# Patient Record
Sex: Male | Born: 1935
Health system: Southern US, Community
[De-identification: ages and names within clinical notes are randomized; demographics above are authoritative.]

## PROBLEM LIST (undated history)

## (undated) DIAGNOSIS — I6502 Occlusion and stenosis of left vertebral artery: Secondary | ICD-10-CM

## (undated) DIAGNOSIS — J45909 Unspecified asthma, uncomplicated: Secondary | ICD-10-CM

## (undated) DIAGNOSIS — K219 Gastro-esophageal reflux disease without esophagitis: Secondary | ICD-10-CM

## (undated) DIAGNOSIS — M1990 Unspecified osteoarthritis, unspecified site: Secondary | ICD-10-CM

## (undated) DIAGNOSIS — I10 Essential (primary) hypertension: Secondary | ICD-10-CM

## (undated) DIAGNOSIS — E1151 Type 2 diabetes mellitus with diabetic peripheral angiopathy without gangrene: Secondary | ICD-10-CM

## (undated) DIAGNOSIS — I25810 Atherosclerosis of coronary artery bypass graft(s) without angina pectoris: Secondary | ICD-10-CM

## (undated) DIAGNOSIS — H409 Unspecified glaucoma: Secondary | ICD-10-CM

## (undated) DIAGNOSIS — E785 Hyperlipidemia, unspecified: Secondary | ICD-10-CM

## (undated) DIAGNOSIS — Z951 Presence of aortocoronary bypass graft: Secondary | ICD-10-CM

## (undated) DIAGNOSIS — G629 Polyneuropathy, unspecified: Secondary | ICD-10-CM

## (undated) DIAGNOSIS — I2510 Atherosclerotic heart disease of native coronary artery without angina pectoris: Secondary | ICD-10-CM

## (undated) DIAGNOSIS — I771 Stricture of artery: Secondary | ICD-10-CM

## (undated) DIAGNOSIS — R079 Chest pain, unspecified: Secondary | ICD-10-CM

## (undated) HISTORY — DX: Atherosclerotic heart disease of native coronary artery without angina pectoris: I25.10

## (undated) HISTORY — DX: Hyperlipidemia, unspecified: E78.5

## (undated) HISTORY — DX: Occlusion and stenosis of left vertebral artery: I65.02

## (undated) HISTORY — DX: Stricture of artery: I77.1

## (undated) HISTORY — DX: Type 2 diabetes mellitus with diabetic peripheral angiopathy without gangrene: E11.51

## (undated) HISTORY — PX: EYE SURGERY: SHX253

## (undated) HISTORY — DX: Unspecified glaucoma: H40.9

## (undated) HISTORY — PX: CORONARY ANGIOPLASTY: SHX604

## (undated) HISTORY — PX: CORONARY STENT PLACEMENT: SHX1402

## (undated) HISTORY — DX: Essential (primary) hypertension: I10

## (undated) HISTORY — DX: Presence of aortocoronary bypass graft: Z95.1

## (undated) HISTORY — PX: SP EXTRACRAN VERT OR THOR CAROTID STENT: HXRAD15

---

## 1993-01-03 DIAGNOSIS — I2510 Atherosclerotic heart disease of native coronary artery without angina pectoris: Secondary | ICD-10-CM

## 1993-01-03 DIAGNOSIS — Z951 Presence of aortocoronary bypass graft: Secondary | ICD-10-CM

## 1993-01-03 HISTORY — PX: CORONARY ARTERY BYPASS GRAFT: SHX141

## 1993-01-03 HISTORY — DX: Atherosclerotic heart disease of native coronary artery without angina pectoris: I25.10

## 1993-01-03 HISTORY — DX: Presence of aortocoronary bypass graft: Z95.1

## 1993-08-11 DIAGNOSIS — Z951 Presence of aortocoronary bypass graft: Secondary | ICD-10-CM | POA: Insufficient documentation

## 1993-11-26 DIAGNOSIS — I25118 Atherosclerotic heart disease of native coronary artery with other forms of angina pectoris: Secondary | ICD-10-CM | POA: Insufficient documentation

## 1998-01-03 DIAGNOSIS — I25810 Atherosclerosis of coronary artery bypass graft(s) without angina pectoris: Secondary | ICD-10-CM

## 1998-01-03 HISTORY — DX: Atherosclerosis of coronary artery bypass graft(s) without angina pectoris: I25.810

## 1998-08-03 ENCOUNTER — Inpatient Hospital Stay (HOSPITAL_COMMUNITY): Admission: AD | Admit: 1998-08-03 | Discharge: 1998-08-05 | Payer: Self-pay | Admitting: Cardiology

## 1998-08-10 ENCOUNTER — Inpatient Hospital Stay (HOSPITAL_COMMUNITY): Admission: EM | Admit: 1998-08-10 | Discharge: 1998-08-11 | Payer: Self-pay | Admitting: Emergency Medicine

## 1998-08-10 ENCOUNTER — Encounter: Payer: Self-pay | Admitting: Cardiology

## 1998-08-11 ENCOUNTER — Encounter: Payer: Self-pay | Admitting: Cardiology

## 1998-11-02 ENCOUNTER — Encounter: Admission: RE | Admit: 1998-11-02 | Discharge: 1998-11-02 | Payer: Self-pay | Admitting: *Deleted

## 1998-11-02 ENCOUNTER — Encounter: Payer: Self-pay | Admitting: *Deleted

## 1999-01-04 DIAGNOSIS — Z951 Presence of aortocoronary bypass graft: Secondary | ICD-10-CM

## 1999-01-04 HISTORY — DX: Presence of aortocoronary bypass graft: Z95.1

## 1999-04-14 ENCOUNTER — Inpatient Hospital Stay (HOSPITAL_COMMUNITY): Admission: AD | Admit: 1999-04-14 | Discharge: 1999-04-15 | Payer: Self-pay | Admitting: Cardiology

## 1999-05-10 ENCOUNTER — Ambulatory Visit (HOSPITAL_COMMUNITY): Admission: RE | Admit: 1999-05-10 | Discharge: 1999-05-10 | Payer: Self-pay | Admitting: Cardiology

## 1999-05-10 ENCOUNTER — Encounter: Payer: Self-pay | Admitting: Cardiology

## 1999-05-24 ENCOUNTER — Inpatient Hospital Stay (HOSPITAL_COMMUNITY): Admission: EM | Admit: 1999-05-24 | Discharge: 1999-05-27 | Payer: Self-pay | Admitting: Emergency Medicine

## 1999-06-04 HISTORY — PX: CORONARY ARTERY BYPASS GRAFT: SHX141

## 1999-06-15 ENCOUNTER — Encounter: Payer: Self-pay | Admitting: Cardiology

## 1999-06-15 ENCOUNTER — Ambulatory Visit (HOSPITAL_COMMUNITY): Admission: RE | Admit: 1999-06-15 | Discharge: 1999-06-15 | Payer: Self-pay | Admitting: Cardiology

## 1999-06-25 ENCOUNTER — Encounter: Payer: Self-pay | Admitting: Cardiothoracic Surgery

## 1999-06-30 ENCOUNTER — Inpatient Hospital Stay (HOSPITAL_COMMUNITY): Admission: RE | Admit: 1999-06-30 | Discharge: 1999-07-05 | Payer: Self-pay | Admitting: Cardiothoracic Surgery

## 1999-06-30 ENCOUNTER — Encounter: Payer: Self-pay | Admitting: Cardiothoracic Surgery

## 1999-07-01 ENCOUNTER — Encounter: Payer: Self-pay | Admitting: Cardiothoracic Surgery

## 1999-07-02 ENCOUNTER — Encounter: Payer: Self-pay | Admitting: Cardiothoracic Surgery

## 1999-07-03 ENCOUNTER — Encounter: Payer: Self-pay | Admitting: Cardiothoracic Surgery

## 1999-07-21 ENCOUNTER — Encounter: Admission: RE | Admit: 1999-07-21 | Discharge: 1999-07-21 | Payer: Self-pay | Admitting: Cardiology

## 1999-07-21 ENCOUNTER — Encounter: Payer: Self-pay | Admitting: Cardiology

## 2000-01-04 DIAGNOSIS — E119 Type 2 diabetes mellitus without complications: Secondary | ICD-10-CM

## 2000-01-04 DIAGNOSIS — E1151 Type 2 diabetes mellitus with diabetic peripheral angiopathy without gangrene: Secondary | ICD-10-CM

## 2000-01-04 HISTORY — DX: Type 2 diabetes mellitus with diabetic peripheral angiopathy without gangrene: E11.51

## 2000-01-04 HISTORY — DX: Type 2 diabetes mellitus without complications: E11.9

## 2000-08-27 ENCOUNTER — Inpatient Hospital Stay (HOSPITAL_COMMUNITY): Admission: EM | Admit: 2000-08-27 | Discharge: 2000-09-01 | Payer: Self-pay | Admitting: Cardiovascular Disease

## 2000-08-29 ENCOUNTER — Encounter: Payer: Self-pay | Admitting: Cardiology

## 2000-08-31 ENCOUNTER — Encounter: Payer: Self-pay | Admitting: Cardiology

## 2000-09-07 ENCOUNTER — Encounter: Admission: RE | Admit: 2000-09-07 | Discharge: 2000-09-07 | Payer: Self-pay | Admitting: Interventional Radiology

## 2000-09-12 ENCOUNTER — Encounter: Admission: RE | Admit: 2000-09-12 | Discharge: 2000-09-12 | Payer: Self-pay | Admitting: Interventional Radiology

## 2001-01-22 ENCOUNTER — Encounter: Admission: RE | Admit: 2001-01-22 | Discharge: 2001-04-22 | Payer: Self-pay

## 2001-02-06 ENCOUNTER — Encounter: Payer: Self-pay | Admitting: Cardiology

## 2001-02-06 ENCOUNTER — Inpatient Hospital Stay (HOSPITAL_COMMUNITY): Admission: EM | Admit: 2001-02-06 | Discharge: 2001-02-07 | Payer: Self-pay

## 2001-02-07 ENCOUNTER — Encounter: Payer: Self-pay | Admitting: Cardiology

## 2001-02-15 ENCOUNTER — Encounter: Payer: Self-pay | Admitting: Cardiology

## 2001-02-15 ENCOUNTER — Encounter: Admission: RE | Admit: 2001-02-15 | Discharge: 2001-02-15 | Payer: Self-pay | Admitting: Cardiology

## 2001-03-05 ENCOUNTER — Ambulatory Visit (HOSPITAL_COMMUNITY): Admission: RE | Admit: 2001-03-05 | Discharge: 2001-03-05 | Payer: Self-pay

## 2001-03-06 ENCOUNTER — Encounter: Admission: RE | Admit: 2001-03-06 | Discharge: 2001-03-06 | Payer: Self-pay | Admitting: Cardiology

## 2001-03-06 ENCOUNTER — Ambulatory Visit (HOSPITAL_COMMUNITY): Admission: RE | Admit: 2001-03-06 | Discharge: 2001-03-06 | Payer: Self-pay

## 2001-03-06 ENCOUNTER — Encounter: Payer: Self-pay | Admitting: Cardiology

## 2001-04-04 ENCOUNTER — Ambulatory Visit (HOSPITAL_COMMUNITY): Admission: RE | Admit: 2001-04-04 | Discharge: 2001-04-04 | Payer: Self-pay | Admitting: Anesthesiology

## 2001-04-04 ENCOUNTER — Encounter: Payer: Self-pay | Admitting: Anesthesiology

## 2001-04-18 ENCOUNTER — Encounter: Admission: RE | Admit: 2001-04-18 | Discharge: 2001-07-17 | Payer: Self-pay

## 2001-05-01 ENCOUNTER — Encounter: Admission: RE | Admit: 2001-05-01 | Discharge: 2001-05-15 | Payer: Self-pay

## 2002-01-09 ENCOUNTER — Encounter: Admission: RE | Admit: 2002-01-09 | Discharge: 2002-01-09 | Payer: Self-pay | Admitting: Rheumatology

## 2002-08-25 ENCOUNTER — Encounter: Payer: Self-pay | Admitting: *Deleted

## 2002-08-25 ENCOUNTER — Emergency Department (HOSPITAL_COMMUNITY): Admission: AD | Admit: 2002-08-25 | Discharge: 2002-08-25 | Payer: Self-pay | Admitting: Emergency Medicine

## 2002-10-04 HISTORY — PX: SHOULDER OPEN ROTATOR CUFF REPAIR: SHX2407

## 2002-10-08 ENCOUNTER — Encounter: Payer: Self-pay | Admitting: Orthopedic Surgery

## 2002-10-15 ENCOUNTER — Ambulatory Visit (HOSPITAL_COMMUNITY): Admission: RE | Admit: 2002-10-15 | Discharge: 2002-10-16 | Payer: Self-pay | Admitting: Orthopedic Surgery

## 2002-11-02 ENCOUNTER — Inpatient Hospital Stay (HOSPITAL_COMMUNITY): Admission: EM | Admit: 2002-11-02 | Discharge: 2002-11-03 | Payer: Self-pay | Admitting: *Deleted

## 2003-11-04 DIAGNOSIS — I771 Stricture of artery: Secondary | ICD-10-CM

## 2003-11-04 HISTORY — DX: Stricture of artery: I77.1

## 2003-11-04 HISTORY — PX: SUBCLAVIAN ARTERY STENT: SHX2452

## 2003-11-05 ENCOUNTER — Encounter: Admission: RE | Admit: 2003-11-05 | Discharge: 2003-11-05 | Payer: Self-pay | Admitting: Cardiology

## 2003-11-11 ENCOUNTER — Inpatient Hospital Stay (HOSPITAL_COMMUNITY): Admission: RE | Admit: 2003-11-11 | Discharge: 2003-11-12 | Payer: Self-pay | Admitting: Cardiovascular Disease

## 2003-11-21 ENCOUNTER — Inpatient Hospital Stay (HOSPITAL_COMMUNITY): Admission: RE | Admit: 2003-11-21 | Discharge: 2003-11-22 | Payer: Self-pay | Admitting: Cardiovascular Disease

## 2003-12-22 ENCOUNTER — Encounter: Admission: RE | Admit: 2003-12-22 | Discharge: 2003-12-22 | Payer: Self-pay | Admitting: Interventional Radiology

## 2004-04-26 ENCOUNTER — Ambulatory Visit (HOSPITAL_COMMUNITY): Admission: RE | Admit: 2004-04-26 | Discharge: 2004-04-26 | Payer: Self-pay | Admitting: Cardiology

## 2004-06-15 ENCOUNTER — Ambulatory Visit (HOSPITAL_COMMUNITY): Admission: RE | Admit: 2004-06-15 | Discharge: 2004-06-15 | Payer: Self-pay | Admitting: Interventional Radiology

## 2004-11-17 ENCOUNTER — Encounter: Admission: RE | Admit: 2004-11-17 | Discharge: 2004-11-17 | Payer: Self-pay | Admitting: Cardiology

## 2004-11-23 ENCOUNTER — Ambulatory Visit (HOSPITAL_COMMUNITY): Admission: RE | Admit: 2004-11-23 | Discharge: 2004-11-23 | Payer: Self-pay | Admitting: Cardiology

## 2007-03-25 ENCOUNTER — Emergency Department (HOSPITAL_COMMUNITY): Admission: EM | Admit: 2007-03-25 | Discharge: 2007-03-25 | Payer: Self-pay | Admitting: Emergency Medicine

## 2009-05-22 ENCOUNTER — Emergency Department (HOSPITAL_COMMUNITY): Admission: EM | Admit: 2009-05-22 | Discharge: 2009-05-23 | Payer: Self-pay | Admitting: Emergency Medicine

## 2010-03-05 ENCOUNTER — Emergency Department (HOSPITAL_COMMUNITY): Payer: Medicare Other

## 2010-03-05 ENCOUNTER — Emergency Department (HOSPITAL_COMMUNITY)
Admission: EM | Admit: 2010-03-05 | Discharge: 2010-03-06 | Disposition: A | Discharge status: Home / Self Care | Payer: Medicare Other | Attending: Emergency Medicine | Admitting: Emergency Medicine

## 2010-03-05 DIAGNOSIS — Z79899 Other long term (current) drug therapy: Secondary | ICD-10-CM | POA: Insufficient documentation

## 2010-03-05 DIAGNOSIS — E7800 Pure hypercholesterolemia, unspecified: Secondary | ICD-10-CM | POA: Insufficient documentation

## 2010-03-05 DIAGNOSIS — E119 Type 2 diabetes mellitus without complications: Secondary | ICD-10-CM | POA: Insufficient documentation

## 2010-03-05 DIAGNOSIS — I10 Essential (primary) hypertension: Secondary | ICD-10-CM | POA: Insufficient documentation

## 2010-03-05 DIAGNOSIS — R079 Chest pain, unspecified: Secondary | ICD-10-CM | POA: Insufficient documentation

## 2010-03-05 LAB — BASIC METABOLIC PANEL
BUN: 12 mg/dL (ref 6–23)
Calcium: 9.6 mg/dL (ref 8.4–10.5)
GFR calc non Af Amer: >60 mL/min (ref >60)
Potassium: 3.5 mEq/L (ref 3.5–5.1)

## 2010-03-05 LAB — DIFFERENTIAL
Basophils Absolute: 0 10*3/uL (ref 0.0–0.1)
Eosinophils Absolute: 0 10*3/uL (ref 0.0–0.7)
Lymphocytes Relative: 18 % (ref 12–46)
Lymphs Abs: 1.3 10*3/uL (ref 0.7–4.0)
Neutrophils Relative %: 73 % (ref 43–77)

## 2010-03-05 LAB — CBC
HCT: 36.3 % — ABNORMAL LOW (ref 39.0–52.0)
MCV: 84 fL (ref 78.0–100.0)
Platelets: 225 10*3/uL (ref 150–400)
RBC: 4.32 MIL/uL (ref 4.22–5.81)
WBC: 7.1 10*3/uL (ref 4.0–10.5)

## 2010-03-05 LAB — POCT CARDIAC MARKERS
CKMB, poc: 1.1 ng/mL (ref 1.0–8.0)
Myoglobin, poc: 67.2 ng/mL (ref 12–200)
Troponin i, poc: <0.05 ng/mL (ref 0.00–0.09)

## 2010-03-06 LAB — POCT CARDIAC MARKERS: Troponin i, poc: <0.05 ng/mL (ref 0.00–0.09)

## 2010-03-22 LAB — POCT I-STAT, CHEM 8
Calcium, Ion: 0.99 mmol/L — ABNORMAL LOW (ref 1.12–1.32)
Chloride: 108 mEq/L (ref 96–112)
Glucose, Bld: 128 mg/dL — ABNORMAL HIGH (ref 70–99)
HCT: 30 % — ABNORMAL LOW (ref 39.0–52.0)
Hemoglobin: 10.2 g/dL — ABNORMAL LOW (ref 13.0–17.0)
Potassium: 4.1 mEq/L (ref 3.5–5.1)

## 2010-03-22 LAB — URINALYSIS, ROUTINE W REFLEX MICROSCOPIC
Glucose, UA: NEGATIVE mg/dL
Ketones, ur: NEGATIVE mg/dL
Nitrite: NEGATIVE
Protein, ur: NEGATIVE mg/dL
pH: 6.5 (ref 5.0–8.0)

## 2010-03-22 LAB — CBC
HCT: 37.5 % — ABNORMAL LOW (ref 39.0–52.0)
Hemoglobin: 13.1 g/dL (ref 13.0–17.0)
MCHC: 35.1 g/dL (ref 30.0–36.0)
MCV: 87.4 fL (ref 78.0–100.0)
Platelets: 215 10*3/uL (ref 150–400)
RDW: 13.2 % (ref 11.5–15.5)

## 2010-03-22 LAB — COMPREHENSIVE METABOLIC PANEL
Albumin: 3.9 g/dL (ref 3.5–5.2)
BUN: 11 mg/dL (ref 6–23)
Calcium: 9.2 mg/dL (ref 8.4–10.5)
Creatinine, Ser: 0.96 mg/dL (ref 0.4–1.5)
Glucose, Bld: 128 mg/dL — ABNORMAL HIGH (ref 70–99)
Potassium: 3.6 mEq/L (ref 3.5–5.1)
Total Protein: 6.5 g/dL (ref 6.0–8.3)

## 2010-03-22 LAB — HEMOCCULT GUIAC POC 1CARD (OFFICE): Fecal Occult Bld: NEGATIVE

## 2010-03-22 LAB — DIFFERENTIAL
Basophils Relative: 0 % (ref 0–1)
Lymphocytes Relative: 17 % (ref 12–46)
Monocytes Absolute: 0.5 10*3/uL (ref 0.1–1.0)
Monocytes Relative: 6 % (ref 3–12)
Neutro Abs: 7.4 10*3/uL (ref 1.7–7.7)
Neutrophils Relative %: 77 % (ref 43–77)

## 2010-03-22 LAB — TROPONIN I: Troponin I: 0.02 ng/mL (ref 0.00–0.06)

## 2010-05-21 NOTE — Procedures (Signed)
[facility]  Patient:    MICHAL, BENESH Visit Number: [id number] MRN: [id number]          Service Type: MED Location: [id number] Attending Physician:  Lawana Pai Dictated by:   Francia Greaves. Carlos Levering, M.D. Proc. Date: 03/06/01 Admit Date:  02/06/2001 Discharge Date: 02/07/2001                             Procedure Report  HISTORY:   [facility]. I evaluate a Review of Health and History form and 14 point review of systems.  PROBLEM LIST:  Our plan is second series of thoracic epidural. I reviewed the thoracic CT which revealed hemangioma at T10. This could explain some of his pain, although not complete. He also does have degenerative components.  PHYSICAL EXAMINATION:  Objectively, he has diffuse parathoracic myofascial discomfort, impaired flexion, extension, and lateral rotational pain. He seems to have less myofascial pain. He has no knew neurological features, motor, sensory, reflexive.  IMPRESSION: 1. Degenerative spinal disease, lumbar spine. 2. Degenerative spinal disease, thoracic spine. 3. Post thoracotomy syndrome.  PLAN:  Thoracic epidural. He is consented.  DESCRIPTION OF PROCEDURE:  The patient is taken to the fluoroscopy suite and was placed in the prone position. Back prepped and draped in usual fashion using a Hustead needle, I advanced to the T10-11 interspace without any evidence of CSF, heme, or paresthesia. Test block uneventfully followed by 40 mg of Aristocort, flushed needle.  She tolerates the procedure well. No complication from our procedure. Discharge instructions given. Dictated by:   Francia Greaves Carlos Levering, M.D. Attending Physician:  Lawana Pai DD:  03/06/01 TD:  03/07/01 Job: [id number]

## 2010-05-21 NOTE — Op Note (Signed)
NAME:  [name], [name]                         ACCOUNT NO.:  [id number]   MEDICAL RECORD NO.:  [id number]                   PATIENT TYPE:  OIB   LOCATION:  NA                                   FACILITY:  [facility]   PHYSICIAN:  Robert A. Noemi Chapel, M.D.              DATE OF BIRTH:  [birth year]   DATE OF PROCEDURE:  10/15/2002  DATE OF DISCHARGE:                                 OPERATIVE REPORT   PREOPERATIVE DIAGNOSIS:  Right shoulder partial rotator cuff tear with  impingement and acromioclavicular joint degenerative joint disease and  spurring.   POSTOPERATIVE DIAGNOSIS:  Right shoulder partial rotator cuff tear with  impingement and acromioclavicular joint degenerative joint disease and  spurring.   PROCEDURE:  1. Right shoulder examination under anesthesia followed by arthroscopic     partial rotator cuff tear debridement.  2. Right shoulder subacromial decompression.  3. Right shoulder distal clavicle excision.   SURGEON:  Audree Camel. Noemi Chapel, M.D.   ASSISTANT:  Matthew Saras, P.A.   ANESTHESIA:  General.   OPERATIVE TIME:  40 minutes.   COMPLICATIONS:  None.   INDICATIONS FOR PROCEDURE:  [name] is a 77 year old gentleman who has had  4-5 months of increasing right shoulder pain with exam and x-rays  documenting rotator cuff tendonitis, possible partial tear with impingement,  who has failed conservative care and is now to undergo arthroscopy.   DESCRIPTION OF PROCEDURE:  [name] was brought to the operating room on  October 15, 2002, after an interscalene block had been placed in the holding  room by anesthesia for postoperative pain control.  He was placed on the  operating table in supine position.  After being placed under general  anesthesia, his right shoulder was examined under anesthesia.  He had full  range of motion and his shoulder was stable to ligamentous exam.  He was  then placed in a beach chair position and his shoulder and arm were prepped  using  sterile DuraPrep and draped using sterile technique.  Originally,  through a posterior arthroscopic portal, the arthroscope with the pump  attached was placed through an anterior portal and an arthroscopic probe was  placed.  On initial inspection, the articular cartilage in the glenohumeral  joint showed mild grade 1-2 chondromalacia.  The anterior labrum and the  anterior inferior labrum, and the anterior inferior glenohumeral ligament  complex was intact.  The superior labrum biceps tendon anchor was intact,  the biceps tendon was intact.  The posterior labrum showed partial tearing  25% which was debrided.  The rotator cuff showed partial tearing of 40-50%  of the under surface of the supraspinatus which was debrided.  The rest of  the rotator cuff was intact.  The inferior capsular recess was free of  pathology.  The subacromial space was entered and the lateral arthroscopic  portal was made.  A large amount of bursitis was resected.  The rotator cuff  was frayed and partially torn and this was debrided, but a complete tear was  not found.  Impingement was noted and a subacromial decompression was  carried out removing 6-8 mm of the under surface of the anterior,  anterolateral, and anteromedial acromion and CA ligament release carried  out, as well.  The [facility] joint showed significant spurring and degenerative  changes, as well, and the distal 5 mm of the clavicle was resected with a 6  mm bur.  After this was done, the shoulder could be brought through a full  range of motion with no impingement on the rotator cuff.  At this point, it  was felt that all pathology had been satisfactorily addressed.  The  instruments were removed.  The portals were closed with 3-0 nylon sutures.  A sterile dressing and sling was applied.  The patient was awakened and  taken to the recovery room in stable condition.   FOLLOW UP CARE:  [name] will be followed overnight for observation.  He  will be  discharged tomorrow if stable.  See me in the office in a week for  sutures out and follow up.                                               Robert A. Noemi Chapel, M.D.    RAW/MEDQ  D:  10/15/2002  T:  10/15/2002  Job:  [id number]

## 2010-05-21 NOTE — Discharge Summary (Signed)
NAME:  [name], [name]                         ACCOUNT NO.:  [id number]   MEDICAL RECORD NO.:  [id number]                   PATIENT TYPE:  INP   LOCATION:  2015                                 FACILITY:  [facility] [facility]   PHYSICIAN:  Jeanella Craze. Little, M.D.              DATE OF BIRTH:  September 23, 1932   DATE OF ADMISSION:  11/01/2002  DATE OF DISCHARGE:  11/03/2002                                 DISCHARGE SUMMARY   DISCHARGE DIAGNOSES:  1. Chest pain, myocardial infarction ruled out.  2. Coronary disease, coronary artery bypass grafting in 1995 with redo     surgery in 2001.  3. Chronic chest pain syndrome.  4. Noninsulin-dependent diabetes.  5. Hypertension, controlled.  6. Treated hyperlipidemia.  7. History of peripheral vascular disease with vertebral artery stenting in     August of 2002.  8. Status post recent shoulder surgery.   HISTORY OF PRESENT ILLNESS:  The patient is a 77 year old male followed by  Dr. Rex Kras and Dr. Nadara Mustard with a history of chronic chest pain after bypass  surgery.  He had his original surgery in 1995 with an LIMA to LAD, SVG to  the OM, and SVG to RCA.  He had redo surgery with a left radial artery to  the LAD in 2001 by Dr. Prescott Gum.  He was catheterized in August of 2002.  He has been followed at the chest pain clinic.  He had a Cardiolite in  February of 2003 that was negative for ischemia.  Recently had had shoulder  surgery on October 15, 2002, by Dr. Noemi Chapel, a right rotator cuff repair.  He  was seen in the emergency room on November 02, 2002, with chest pain, which  was described as midsternal and sharp.  He says that nitroglycerin seemed to  help.  He was noted to be febrile in the emergency room.   HOSPITAL COURSE:  The patient was admitted to telemetry.  CK-MBs and  troponins were obtained.  He had a recent abdominal ultrasound in August  that was negative.  A carotid ultrasound was unremarkable in January of  2004.  He has had carotid stenting in  August of 2002.  CK-MBs and troponins  were subsequently negative for an MI.  He did complain of dysuria and a  urinalysis showed nitrates and white cells.  A urine culture was sent, as  well as blood cultures.  He was started on Septra for presumed UTI.  We went  ahead with a CT of his chest to rule out pulmonary embolism.  This was  negative.  We feel that he can be discharged on November 03, 2002.   DISCHARGE MEDICATIONS:  1. Glucophage 500 mg a day.  2. Lipitor 40 mg a day.  3. Altace 10 mg a day.  4. Prilosec 20 mg a day.  5. Aspirin 81 mg a day.  6. Niacin as taken at home.  7. Septra DS one p.o. b.i.d. for 10 days.   LABORATORY DATA:  Spiral CT was negative for pulmonary embolism.  Chest x-  ray showed no active disease or evidence of COPD.  The white count on  admission was 16.2.  The white count at discharge was 13.3, hemoglobin 14,  hematocrit 39.8, and platelets 196.  He did have 87 neutrophils.  Sodium  132, potassium 4.5, BUN 14, creatinine 0.9.  Liver functions were normal.  CK-MBs and troponins were negative.  The lipid profile showed a cholesterol  of 115, triglycerides 92, HDL 42, and LDL 55.  The urinalysis showed ketones  and proteinuria, positive nitrates, moderate leukocyte esterase, and many  bacteria and white cells.  Blood cultures were negative.  Urine culture  showed 9000 colony count.   DISPOSITION:  The patient was discharged in stable condition with a presumed  UTI despite his negative culture.   FOLLOWUP:  He will follow up with Dr. Rex Kras in the office in a few weeks.  He will keep his regularly scheduled appointment with Dr. Nadara Mustard.      Erlene Quan, P.A.                      Jeanella Craze. Little, M.D.    Meryl Dare  D:  11/19/2002  T:  11/19/2002  Job:  [id number]   cc:   Rory Percy  [address] [place]. [place] [address]  Fax: [phone]

## 2010-05-21 NOTE — Op Note (Signed)
[place]  Patient:    [name], [name]                        MRN: [id number] Proc. Date: 06/30/99 Attending:  Len Childs, M.D. CC:         Jeanella Craze. Little, M.D.             Cardiothoracic Surgery office                           Operative Report  OPERATION:  Redo coronary artery bypass grafting x 1 using left radial artery graft to distal left anterior descending.  PREOPERATIVE DIAGNOSIS: Class 4 progressive angina, recurrent following coronary artery bypass grafting x 4 in 1995.  POSTOPERATIVE DIAGNOSIS: Class 4 progressive angina, recurrent following coronary artery bypass grafting x 4 in 1995.  SURGEON:  Len Childs, M.D.  ASSISTANT:  Ricki Miller, P.A.  ANESTHESIA:  General, by Glynda Jaeger, M.D.  INDICATIONS:  The patient is a 77 year old male patient of Dr. Aldona Bar who had previously undergone four vessel coronary artery bypass grafting by Dr. Merleen Nicely over six years ago.  He presented with recurrent chest pain and cardiac catheterization demonstrated stenosis at the left anterior descending - mammary artery anastomosis.  This was treated percutaneously but with subsequent recurrent stenosis and he was referred for redo bypass grafting. Prior to the operation I examined the patient in the office and reviewed the results of his latest cardiac catheterization with the patient and family. I discussed the indications and expected benefits of redo bypass grafting using a radial artery graft to his left anterior descending.  He understood the choice of conduit, the placement of the surgical incision, use of general anesthesia and the use of cardiopulmonary bypass.  I discussed the risks associated with this operation to the patient and with his family and discussed specifically the risks of myocardial infarction, cerebrovascular accident, bleeding, infection, and death.  He understood the  alternative therapies to surgical revascularization.  He agreed to proceed with the operation as planned under informed consent.  OPERATIVE FINDINGS:  The radial artery was excellent vessel without evidence of atherosclerosis and there was an excellent pulse in the ulnar artery after removal of the radial artery.  The distal left anterior descending was moderately diseased and sclerotic and the anastomosis was placed just distal to the previously placed left anterior descending stent.  The vein graft to the circumflex and to the right coronary artery was identified and preserved. The mammary artery was identified and preserved during the revascularization.  DESCRIPTION OF OPERATION:  The patient was brought to the operating room and placed supine on the operating table where general anesthesia was introduced under invasive hemodynamic monitoring.  The chest, abdomen and legs were prepped with Betadine and draped as a sterile field.  The left arm was prepped and draped as a sterile field.  The left radial artery was harvested as a free graft and flushed with a papaverine heparin solution.  It was an excellent vessel for a conduit.  The left arm incision was closed in two layers using running Vicryl.  A sternotomy incision was then made through the previous scar using the oscillating saw to avoid injury to the underlying vascular structures.  The mediastinum was dissected out of dense investing adhesions. The previously placed bypass grafts were identified and protected.  The mammary artery  pedicle was identified and encircled with a vascular loop.  The patient was then administered heparin.  Aprotinin was used in the bypass protocol for this patient.  When the ACT was documented as being therapeutic, the patient was cannulated through pursestrings in the ascending aorta and right atrium and placed on cardiopulmonary bypass.  The remainder of the adhesions were dissected for access to the  left anterior descending. Cardioplegia cannula was replaced with both retrograde and antegrade delivery of cold blood cardioplegia.  The patient was cooled to 28 degrees and the radial artery graft was prepared.  The patient was then cross clamped and cardioplegia was delivered both antegrade and retrograde with immediate cardioplegic arrest and septal temperature drop to less than 12 degrees. Topical iced saline slush was used on myocardial preservation and pericardia insulator pad was used to protect the left phrenic nerve.  The distal coronary anastomosis was then performed.  The left anterior descending was opened just distal to the previously placed mammary artery graft and was a 1.5 mm vessel with a slight vessel wall thickening.  The stent was palpable more proximally.  The free left radial graft was then sewn in an end to side fashion using running 8-0 Prolene and there was good flow through the graft.  Cardioplegia was redosed.  While the cross clamp remained in place, the radial artery graft was anastomosed to the ascending aorta using a 4.0 mm punch and running 7.0 Prolene.  Air was flushed from the coronaries and ascending aorta with a dose of retrograde warm blood cardioplegia and the aortic cross clamp was removed.  The mammary artery pedicle had been occluded with an atraumatic vascular clamp during global ischemia.  The heart resumed a spontaneous rhythm and the patient was rewarmed to 37 degrees.  Temporary pace monitors were applied and hemostasis was documented at the proximal and distal anastomoses of the radial artery.  The lungs were expanded and the patient was then weaned from cardiopulmonary bypass on minimal inotropic support when temperature was normothermia.  Cardiac output and blood pressure were stable and the cannulas were removed.  Protamine was administered.  The mediastinum was irrigated with warm antibiotic irrigation. The superior pericardium, thymic  fat was closed over the aorta, vein grafts and radial artery.  Two mediastinal chest tubes were placed and brought out  through separate incisions.  The sternum was reapproximated with interrupted steel wire and pectoralis fashioned.  Subcutaneous layers were closed with a running Vicryl.  The skin was closed with a subcuticular Vicryl and sterile dressings were applied.  Total cardiopulmonary bypass time:  85 minutes with aorta cross clamp time of 42 minutes. DD:  06/30/99 TD:  07/01/99 Job: [id number]

## 2010-05-21 NOTE — Consult Note (Signed)
[facility]  Patient:    [name], [name] Visit Number: [id number] MRN: [id number]          Service Type: PMG Location: [facility] Attending Physician:  Cloria Spring Dictated by:   Francia Greaves Carlos Levering, M.D. Proc. Date: 03/20/01 Admit Date:  01/22/2001                            Consultation Report  REASON FOR CONSULTATION:  [name] [facility].  I evaluate him and review health and history form, 14 point review of systems.  In the interim, he has had to visit the emergency department, this was last Friday, for flank pain.  He was worked up for kidney stone, which he has had in the past, but apparently unremarkable.  He was also placed on Z-Pak which he continues.  I do not believe that we should proceed today with injection.  He has had some relief cycling with thoracic epidural, but rather than proceed with third in series, I think we are going to need to obtain a bone scan, due to his periodic nature and point tenderness, most notably in the bony perithoracic regions, as well as his somewhat persistent escalation in pain.  Instructed to maintain contact with primary care.  He is having inadequate relief cycling with Tylenol #3.  I do think he is a forthright patient, and with full informed physician, as well as distribution materials, we will go ahead and trial Duragesic.  He understands the risks of these medications, understands patient care agreement.  Objectively, he has diffuse paralumbar myofascial discomfort.  Impaired flexion, extension, lateral rotational pain, modest flank pain, but his myofascial position overrides the flank pain.  He has increased pain with inspiration, but this is not changed from his previous pleuritic presentation.  IMPRESSION:  Pleuritic chest wall pain, unclear etiology, myofascial pain syndrome, flank pain, unclear etiology.  PLAN:  As outlined above.  Initiate Duragesic for  relief cycling.  Discussed with him.  Extensive consultation, review of his medications. Dictated by:   Francia Greaves Carlos Levering, M.D. Attending Physician:  Cloria Spring DD:  03/20/01 TD:  03/21/01 Job: [id number]

## 2010-05-21 NOTE — Consult Note (Signed)
[facility]  Patient:    HARSH, SCIPIO Visit Number: [id number] MRN: [id number]          Service Type: PMG Location: [facility] Attending Physician:  Cloria Spring Dictated by:   Cloria Spring, D.O. Proc. Date: 02/21/01 Admit Date:  01/22/2001   CC:         Delrae Alfred B. Little, M.D.   Consultation Report  HISTORY OF PRESENT ILLNESS:  Mr. Hogland returns to clinic today as scheduled for reevaluation.  He was last seen on January 31, 2001.  In the interim, Mr. Broomfield has discontinued taking the Topamax and Lortab secondary to no improvement in symptoms.  The patient states that he had a CT scan of his heart last week but does not know the results.  He continues to have pleuritic-type chest pain with deep inspiration.  On further questioning, again it is noted that Mr. Campoverde has not had any relief in the past with morphine when he was in the hospital and he has tried multiple nonsteroidal anti-inflammatory medications and even oral prednisone without any relief.  He has tried multiple sleeping medications without improvement as well.  His pain is a 7/10 on a subjective scale.  His function and quality of life indices remain stable.  His sleep is poor.  Mr. Hacking states that previously he was tolerating his chest discomfort, thinking that this is something he would have to live with status post coronary artery bypass grafting.  His main concern was problems with his heart specifically.  I review the health and history form and 14-point review of systems.  PHYSICAL EXAMINATION:  GENERAL:  A healthy male in no acute distress.  VITAL SIGNS:  Blood pressure 148/80, pulse 57, respirations 16 and regular, and O2 saturation is 99% on room air.  CHEST:  Palpatory examination of the anterior chest wall reveals mild tenderness to palpation at the left sternocostal junction, which does not reproduce the patients symptoms.  The patients symptoms are reproduced with deep  inspiration.  I am unable to reproduce these with any type of palpatory maneuvers.  The patient has symmetric rib motion on inspiration and exhalation.  IMPRESSION: 1. Anterior chest wall pain, etiology uncertain.  Symptoms are pleuritic in    nature, status post coronary artery bypass surgery. 2. Nonrestorative sleep disorder. 3. A history of migraine headaches, controlled.  PLAN: 1. At this point, there is not much more than I can offer Mr. Asada, in terms    of management of his pleuritic type pain.  I would like to refer him to    [facility] at [facility] for a second opinion    and possible intervention.  I discussed this with the patient at length and    he is in agreement. 2. The patient to return to clinic to be seen by Dr. Kennith Gain.  The patient was educated on the above findings and recommendations and understands.  There were no barriers to communication. Dictated by:   Cloria Spring, D.O. Attending Physician:  Cloria Spring DD:  02/21/01 TD:  02/22/01 Job: [id number]

## 2010-05-21 NOTE — Consult Note (Signed)
[facility]  Patient:    Craig, Gardner Visit Number: [id number] MRN: [id number]          Service Type: PMG Attending Physician:  Cloria Spring Dictated by:   Francia Greaves Carlos Levering, M.D. Admit Date:  01/22/2001 Discharge Date: 04/22/2001   CC:         Rory Percy, M.D.   Consultation Report  [name] [facility].  I evaluate and review health and history form, 14 point review of systems.  The bone scan reveals that [name] has probably had a rib fracture but I cannot rule out covert pathology.  He has also apparently demonstrated some modest rectal bleeding and he has been evaluated by primary care and is sent to GI at our request.  I am going to switch him off Duragesic to p.r.n. medication.  The Duragesic has not afforded him significant relief cycling and in fact he is improved.  I think the interventional approach as well as our conservative management approach has helped him and I review the risks of these medications and usage patterns.  I will also continue with anti-inflammatory and I choose Motrin.  Will see him in one to two months.  As he has his primary care work-up he will keep [place] informed.  If he is doing well he is instructed just to see [place] on a p.r.n. basis as I have hopes that this rib fracture will heal and it is not representative of a metastatic process.  OBJECTIVE:  MUSCULOSKELETAL:  Less chest wall pain.  Myofascial discomfort is improved. He does have the left lateralizing point tenderness consistent with bone scan.  IMPRESSION:  Rib fracture, probable healing, chest wall pain.  PLAN:  As outlined above.  Extensive consultation. Dictated by:   Francia Greaves Carlos Levering, M.D. Attending Physician:  Cloria Spring DD:  04/24/01 TD:  04/24/01 Job: [id number]

## 2010-05-21 NOTE — Cardiovascular Report (Signed)
[name], [name]               ACCOUNT NO.:  [id number]   MEDICAL RECORD NO.:  [id number]          PATIENT TYPE:  OIB   LOCATION:  2899                         FACILITY:  [facility]   PHYSICIAN:  [name], M.D. DATE OF BIRTH:  February 08, 1932   DATE OF PROCEDURE:  11/23/2004  DATE OF DISCHARGE:                              CARDIAC CATHETERIZATION   INDICATIONS FOR TEST:  This 77 year old male has had bypass surgery in 1995  and redo surgery with a free radial graft to his LAD in 2001.  He has had  chronic chest pain since his original open heart surgery and is being  followed in the pain clinic.  He had had a change in the quality of his pain  and had some nonspecific ST-segment changes in the lateral leads.  Because  of this he is brought in for outpatient cardiac catheterization.   He had had a hypotensive event during his last cardiac catheterization that  was raising concern that he may have a contrast allergy.  Because of this he  was pre medicated with H2 blockers and prednisone and Benadryl.   COMPLICATIONS:  None with no dye reaction.   EQUIPMENT:  5-French Judkins configuration catheters and all grafts were  visualized with the right coronary catheter.   TOTAL CONTRAST:  80 mL.   RESULTS:  1.  Hemodynamic monitoring:  Central aortic pressure was 142/60.  Left      ventricular pressure was 141/5 and there was no aortic valve gradient      noted at the time of pullback.  This is different from the 11 mm      gradient that was noted at his last catheterization and reevaluation      with wave form clearly shows no gradient across the aortic valve.  2.  Ventriculography:  Ventriculography from the RAO projection reveals      normal LV systolic function, ejection fraction greater than 60%, left      ventricular end-diastolic pressure 16, no mitral regurgitation.  3.  Coronary arteriography:  On fluoroscopy stents were noted in the      subclavian and distribution of the  LAD.   1.  Left main:  The left main was approximately 30% narrowed distally and      was slightly hypolucent.  The LAD was 100% occluded at the left main and      there was a stent noted at the proximal portion of the in the LAD      distribution.  2.  Circumflex:  The circumflex was a large vessel, gave rise to OM vessels      and it was grafted.  The proximal portion of the circumflex had 30-40%      irregularities.  3.  Right coronary artery:  The right coronary artery had proximal and mid      irregularities of 40-50%.  The PDA, large posterior lateral branch, and      a small posterior lateral branch were all well visualized and the distal      PDA and PLs were all free of disease.  GRAFTS:  1.  Saphenous vein graft to the OM:  This graft was widely patent and filled      both the OM that is inserted into and retrograde filled OM #1.  This      graft was also well visualized with injection of the native circulation.  2.  Free radial to the LAD:  The free radial graft was large and widely      patent.  There was good visualization of the LAD and retrograde filling      of the diagonal all the way up to the stent that was in the proximal      portion of the LAD but the stent was totally occluded.  3.  Saphenous vein graft to the PDA:  This graft was widely patent.  The      entire right coronary artery with some reflux into the aorta was noted.      The RCA anatomy is well defined above.  4.  Attempts at entering the subclavian were performed without difficulty.      The stent in the left subclavian artery and in the left vertebral artery      were widely patent.  There was no visualization of the PDA.   CONCLUSION:  1.  Loss of the internal mammary artery to the left anterior descending      graft.  Widely patent saphenous vein graft to the obtuse marginal,      widely patent saphenous vein graft to the right coronary artery, and      widely patent free radial to the left  anterior descending.  2.  Normal left ventricular systolic function.   I cannot explain his chest pain or his nonspecific EKG changes based on his  cardiac anatomy.   I am quite impressed at how appropriate his cardiac circulation is and how  patent the saphenous vein grafts are at 11 years.  The patient will be  discharged to home today and follow up in my office tomorrow for leg check.           ______________________________  [name], M.D.     ABL/MEDQ  D:  11/23/2004  T:  11/23/2004  Job:  [id number]   cc:   Rory Percy  Fax: [place]   Cath Lab

## 2010-05-21 NOTE — Cardiovascular Report (Signed)
[place]  Patient:    [name], [name]                      MRN: [id number] Proc. Date: 05/28/99 Adm. Date:  [id number] Disc. Date: [id number] Attending:  Lawana Pai CC:         Jeanella Craze. Little, M.D.             Troy Sine, M.D.             Cardiac Catheterization Lab             [address], [place] office.                        Cardiac Catheterization  INDICATIONS:  Mr. [name] is a 77 year old white male patient of Dr. Rex Kras.  He is status post prior CABG revascularization surgery; with LIMA to the LAD, saphenous vein to the marginal, saphenous vein to the right coronary artery.  The patient had previously undergone remote stenting to the proximal and mid LAD.  In August 2000 the patient had a small LIMA vessel, and the LAD stent sites were open.  Most recently, a catheterization in August 2001, the LAD was totally occluded at the left main and two stents were noted on fluoroscopy.  The circumflex had a 60% proximal narrowing.  The right coronary artery was diffusely diseased in the proximal mid segments.  He had a patent vein graft to the marginal vessel, and a patent vein graft to the PDA.  The patient was found to have high-grade stenosis at the anastomosis of the LIMA; the LIMA was now otherwise fairly large in caliber.  The patient underwent intervention by Dr. Rex Kras at the anastomosis site to the LIMA.  But, it was felt also at the time that the distal strut of the mid LAD stent was encroaching upon this LIMA anastomosis.  The patient had done well initially, but has experienced recent recurrent chest pain.  A Cardiolite scan has suggested anterolateral ischemia.  The patient was recently seen in Dr. Unk Pinto office and admitted for catheterization, and attempt at intervention; possibly involving opening up the native LAD in light of the fact that there was potential mechanical obstruction at the LIMA anastomosis  site.  HEMODYNAMIC DATA: Central aortic pressure:  125/51.  LEFT VENTRICULOGRAM:  Because of the recent ventriculogram, this was not done again.  ANGIOGRAPHIC DATA: 1. Left Main Coronary Artery:  Patent, and essentially gave rise to the    circumflex vessel. 2. Left Anterior Descending Coronary Artery:  Occluded at the left main,    without any nubbin being visualized.  The proximal and the mid LAD    stents were noted by fluoroscopy. 3. Circumflex:  Had 70% stenosis proximally.  Visualization of the graft was    present.  The marginal vessel seemed to be patent from the retrograde    view. 4. Right Coronary Artery:  Had diffuse stenoses of 70, 80 and 90% in the    proximal to mid segment.  The distal RCA was free of significant disease. 5. GRAFTS:    a. The saphenous vein graft to the circumflex marginal was widely patent.    b. The saphenous vein graft to the right coronary artery was widely patent.    c. The LIMA to the LAD was a moderate-sized vessel.  There was 50-70% mid       distal narrowing.  There was now 80% stenosis at the recent       intervention site.  It seemed to encompass the distal aspect of the       stent to the mid LAD.  It had been previously discussed with Dr. Rex Kras that there may be an attempt at opening up the native LAD.  The patient had initially been scheduled for this procedure to be done next week, with possible rotoblation to be done by Dr. Tami Ribas.  However, because of the patients recent recurrent symptomatology, hospitalization was done presently and catheterization was done this week.  Attempts at trying to open up the LAD were initially made after the sheath was exchanged for an 8-French sheath, in the event rotational atherectomy was to be performed.  PERCUTANEOUS INTERVENTION:  An 8-French left catheter was used.  Heparin was administered, with documentation of therapeutic ACT.  Multiple attempts were made with multiple wires to try and  open up this LAD.  The ostium appeared initially.  A long Cross-It 200, Cross-It 300, Choice PT, Choice PT Graphic wires were ultimately used.  The wire was ultimately able to get into the proximal LAD stent, but was never able to get beyond the mid portion of the stent; where the stent had significant tortuosity.  A 2.5 Open-Sail balloon was used in an attempt to do initial dilatation at the ostium, as well as help guide the wire to provide backup.  After significant attempt to try and open this stent, the decision was made to abort this attempt.  At present, there was no evidence for any dissection.  It was felt that further attempts would be at high risk.  At this time, the decision was made to redirect efforts to opening up the distal LIMA anastomosis site.  A BMW wire was able to cross the anastomosis site and was advanced down the LIMA via the left internal mammary guide.  The 2.5 Open-Sail balloon was then deployed at the anastomosis and dilated up to 2.75 mm.  Dilatation was also done in the mid distal LIMA at the previous 70% narrowing, and this was dilated up to 10 atm.  Scout angiography confirmed an excellent angiographic result, both in the distal LIMA site as well as the anastomosis site.  There was TIMI-3 flow.  There was no evidence for dissection.  ACT was documented to be therapeutic at the end of the study.  IMPRESSION: 1. Severe native coronary obstructive disease, with total occlusion of the    LAD at the left main, 70% proximal circumflex stenosis, and diffuse 70-90%    RCA stenoses. 2. Patent SVG to the RCA. 3. Patent SVG to the obtuse marginal vessel. 4. Left internal mammary artery to the LAD with 80% stenosis at the    anastomosis, with haziness; and 70% stenosis proximal to the anastomosis    site. 5. Very difficult attempt, unsuccessful at opening the native proximal LAD. 6. Successful PTCA of the anastomosis of the LIMA to the LAD, and also    involving  the distal LAD; with a percent in diameter stenosis being reduced     from 70 and 80% to 0%. DD:  05/28/99 TD:  05/31/99 Job: [id number]

## 2010-05-21 NOTE — H&P (Signed)
NAME:  [name], [name]                         ACCOUNT NO.:  [id number]   MEDICAL RECORD NO.:  [id number]                   PATIENT TYPE:  INP   LOCATION:  2015                                 FACILITY:  [facility]   PHYSICIAN:  Erlene Quan, P.A.                DATE OF BIRTH:  09/02/1932   DATE OF ADMISSION:  11/01/2002  DATE OF DISCHARGE:                                HISTORY & PHYSICAL   CHIEF COMPLAINT:  Chest pain.   HISTORY OF PRESENT ILLNESS:  The patient is a 77 year old male who is  followed by Rory Percy and Dr. Rex Kras with a history of chronic chest pain  after bypass surgery.  He had his original surgery in 1995.  He had an LIMA  to the LAD, SVG to OM, SVG to RCA.  He underwent redo surgery in 2001 by Dr.  Tharon Aquas Trigt, with a left radial artery to the LAD.  He had his last  catheterization in August of 2002.  He has been followed at the chest pain  clinic.  His last Cardiolite was February of 2002.  It was negative for  ischemia.  He was admitted through the emergency room on November 01, 2002,  after he presented with sudden midsternal chest pain described as sharp  after getting up to the bathroom.  The symptoms lasted about five minutes  and seemed to improve with nitroglycerin.   PAST MEDICAL HISTORY:  1. Non-insulin-dependent diabetes.  2. Treated hypertension.  3. Treated hyperlipidemia.  4. Peripheral vascular disease.  5. He has had a vertebral artery stent by Dr. Estanislado Pandy in August 2002.  6. He has had a rotator cuff surgery by Dr. Audree Camel. Noemi Chapel, October 15, 2002.   MEDICATIONS:  1. Glucophage 500 mg.  2. Lipitor 40 mg a day.  3. Altace 10 mg a day.  4. Prilosec 20 mg a day.  5. Aspirin 81 mg a day.  6. Niacin.  7. Percodan p.r.n.   ALLERGIES:  CONTRAST DYE which causes convulsions.   SOCIAL HISTORY:  He is a nonsmoker.  He lives with his wife.  He has one  daughter and two grandchildren.   FAMILY HISTORY:  Mother has a history of  diabetes and coronary disease.  Two  brothers have coronary disease.   REVIEW OF SYSTEMS:  Negative for renal disease.  He had no GI bleeding.  He  had an endoscopy two to three years ago.  He has had some dysuria for the  last 24 hours.  He denies hemoptysis.  He denies any fever or chills at  home.   PHYSICAL EXAMINATION:  VITAL SIGNS:  Blood pressure 104/66.  Temperature  here is 100.4, respirations 16.  GENERAL:  He is a well-developed, thin male, in no acute distress.  HEENT:  Normocephalic.  Extraocular movements are intact.  Sclerae is  nonicteric.  Lids  and conjunctivae are within normal limits.  NECK:  No JVD.  He does have a left carotid artery bruits.  CHEST:  Clear to auscultation and percussion.  CARDIAC:  Exam reveals a regular rate and rhythm with a soft, systolic  murmur at the left sternal border.  Normal S1 and S2.  ABDOMEN:  Nontender, no hepatosplenomegaly.  EXTREMITIES:  Without edema.  PULSES:  The pulses are 3+/4.  NEUROLOGIC EXAM:  Grossly intact.   LABORATORY DATA:  White count 13.3, hemoglobin 14, hematocrit 39.8,  platelets 196.  Sodium 132, potassium 4.5, BUN 14, creatinine 0.9.  Troponins are negative x2.   IMPRESSION:  1. Chest pain, rule out myocardial infarction.  2. Coronary disease, coronary artery bypass grafting in 1995 with redo in     2001.  3. Chronic chest pain, followed at the pain clinic.  4. Fever.  Question urinary tract infection, question pulmonary embolism as     he has had recent surgery.  5. Non-insulin-dependent diabetes mellitus.  6. Hypertension.  7. Hyperlipidemia.  8. Peripheral vascular disease with vertebral artery stenting, August 2002.   PLAN:  The patient to be admitted to telemetry.  We will check a UA and C&S,  and rule him out for a myocardial infarction.                                                Erlene Quan, P.AMeryl Dare  D:  11/03/2002  T:  11/03/2002  Job:  [id number]

## 2010-05-21 NOTE — Consult Note (Signed)
[facility]  Patient:    [name], [name] Visit Number: [id number] MRN: [id number]          Service Type: PMG Location: [facility] Attending Physician:  Cloria Spring Dictated by:   Cloria Spring, D.O. Proc. Date: 01/31/01 Admit Date:  01/22/2001                            Consultation Report  [name] returns to clinic today as an unscheduled visit regarding his anterior chest wall pain.  He was initially seen on January 24, 2001.  At that time I had prescribed Bextra 20 mg daily as well as Topamax 25 mg at bedtime for restorative sleep capacity and migraine prophylaxis.  Patient called our clinic on January 27 stating that he was taking the Bextra and was noticing some increased urinary frequency and thought it might be related to the [place].  In addition, he states that the Bextra was not helping his pain.  I advised [name] at the time to discontinue the Bextra to see if his urinary frequency returned to normal and to return to clinic or to call regarding his status.  He called today and states that he has stopped the Bextra and his urinary frequency has returned to normal.  His pain persists.  He was advised to come into clinic and he presents complaining of 8/10 pain in his anterior chest wall.  He has tried Ultracet and Ultram in the past.  We discussed further medical management of his anterior chest pain which is pleuritic in nature.  Patient denies any new neurologic symptoms.  I review the health and history form and 14 point review of systems.  PHYSICAL EXAMINATION  GENERAL:  Healthy male in no acute distress.  VITAL SIGNS:  Blood pressure 127/69, pulse 66, respirations 22, O2 saturation 97% on room air.  CHEST:  Palpatory examination does not reveal any tenderness in the anterior chest wall.  Patients heart is regular.  Lungs are clear to auscultation.  IMPRESSION: 1. Anterior chest wall pain, etiology uncertain.  Symptoms are pleuritic  in    nature.  Patient is status post coronary artery bypass surgery. 2. Nonrestorative sleep disorder. 3. History of migraine headaches.  PLAN: 1. Patient instructed to fill his Topamax prescription. 2. Will prescribe Lortab 5 mg one to two p.o. t.i.d. p.r.n. for pain #60. 3. Patient instructed not to resume Bextra at this point.  Will consider    resuming it at a later date and monitor for any type of urinary symptoms. 4. Patient to return to clinic as scheduled on February 21, 2001 or sooner as    needed. 5. Will consider referral to Dr. Carlos Levering for possible interventional    procedures.  Patient was educated on the above findings and recommendations and understands.  There were no barriers to communication. Dictated by:   Cloria Spring, D.O. Attending Physician:  Cloria Spring DD:  01/31/01 TD:  02/01/01 Job: [id number]

## 2010-05-21 NOTE — Discharge Summary (Signed)
[place]  Patient:    [name], [name]                        MRN: [id number] Adm. Date:  05/24/99 Disc. Date: 05/27/99 Attending:  Jeanella Craze. Little, M.D. Dictator:   Jaquita Rector, P.A. CC:         Rory Percy, M.D.                           Discharge Summary  DATE OF BIRTH:  12/20/1932  DISCHARGE DIAGNOSES: 1. Unstable angina. 2. Hypertension. 3. Hyperlipidemia. 4. Diabetes mellitus. 5. Reflux. 6. Coronary artery disease, status post coronary artery bypass grafting in 1995.  PROCEDURE:  Cardiac catheterization performed on May 26, 1999, by Dr. Troy Sine, with unsuccessful opening of the native proximal left anterior descending coronary artery (100% left main, 70% proximal circumflex).  Successful percutaneous transluminal coronary angioplasty of the anastomosis of the left internal mammary artery to the left anterior descending coronary artery and distal left anterior  descending coronary artery.  COMPLICATIONS:  Severe hypotension and bradycardia after sheath removal, requiring 2 mg of IV atropine.  Believed to be a vasovagal response.  CONDITION ON DISCHARGE:  Much improved.  No complaints of chest pain, shortness of breath, or arrhythmias.  Ambulating in the hallway without difficulty.  DISPOSITION:  Home.  DISCHARGE MEDICATIONS: 1. Altace 1.25 mg one p.o. q.d. 2. Enteric-coated aspirin 325 mg one p.o. q.d. 3. Prevacid 30 mg one p.o. q.d. 4. Glucophage 500 mg three tablets q.h.s. (start on Monday). 5. Imdur 30 mg 1/2 tablet p.o. q.d. 6. Plavix 75 mg one p.o. q.d. with meals x 4 weeks. 7. Lipitor 10 mg one p.o. q.d. 8. Sublingual nitroglycerin p.r.n. chest pain.  DISCHARGE INSTRUCTIONS: 1. No strenuous activity until seen by Dr. Delrae Alfred B. Little. 2. Observe right groin for severe swelling, bruising, or bleeding.  Call    for fever greater than 101 degrees. 3. Maintain low-fat, low-salt, low-cholesterol,  low-sugar diet. 4. No strenuous activity, sexual activity, or heavy lifting of greater than    10 pounds over the next four days. 5. Check blood pressure and heart rate daily. 6. Check blood sugars daily. 7. See Dr. Rex Kras in one week.  Call the office for an appointment. 8. Follow up with Dr. Rory Percy for blood sugar control.  ALLERGIES:  No known drug allergies.  LABORATORY DATA:  Hematology on discharge:  White blood count 6.4, hemoglobin 14, hematocrit 41.6, platelets 244.  Coag studies on admission:  PT 14.3, INR 1.2, TT 30.  Routine chemistries on discharge:  Sodium 136, potassium 4.1, chloride 102, CO2 of 29, glucose 164, BUN 11, creatinine 1.1, calcium 8.9.  Cardiac markers:  Total CPK ranged from 60-27, CPK-MB 2.0 to 0.7, relative index 3.3 to 4.1, troponin I less than 0.03.  A portable chest x-ray done on May 27, 1999:  Showed support apparatus in good position, without complicating features.  Initial electrocardiogram done on May 24, 1999, showed marked sinus bradycardia. Subsequent electrocardiogram on the following day with no changes.  Continued electrocardiograms on May 26, 1999, and May 27, 1999, all showed marked sinus bradycardia with no significant changes.  A cardiac catheterization performed on May 26, 1999, by Dr. Troy Sine, showed severe native coronary artery obstructive disease, with a total occlusion of the LAD at the left main, a 70% proximal circumflex, and diffuse  70%-90% RCA stenosis. Patent graft to the RCA and obtuse marginal vessel.  LIMA to the LAD with 80% stenosis at the anastomosis and 70% stenosis proximal to the anastomosis site. A very difficult attempt, which was unsuccessful at opening the native proximal LAD. A successful PTCA of the anastomosis of the LIMA to the LAD, and distal LAD.  HOSPITAL COURSE:  The patient is a 77 year old white male, admitted on May 24, 1999, with unstable angina, status post CABG in 1995.   Has had recurrent angina  with two stents in the native proximal and mid-LAD on April 13, 1999, with a repeat cardiac catheterization and total occlusion of the LAD (both stents).  The LIMA 90% distal anastomosis stenosis.  Difficult PTCA through the LIMA to the distal site, with adequate result (looks like stent strut for distal stent at the LIMA site). Recurrent chest pain with nuclear study on May 10, 1999, showing anterior ischemia.  Plan for elective high-risk complex PTCA/rotoblator to the native LAD by Dr. Alla German scheduled for Jun 01, 1999.  Over the last 36 hours, increased episodes of chest pain, intermittently improved with sublingual nitroglycerin, but continues to occur even at rest.  HOSPITAL COURSE:  The patient was admitted with unstable angina and started on V heparin, nitroglycerin, beta blocker, and aspirin.  The patient will need high-risk intervention and will discontinue Glucophage until after the catheterization. he patient became pain-free at approximately at 1 p.m. that day, on oxygen and IV nitroglycerin and heparin.  The patient then complained of chest pain later that evening after nitroglycerin had been turned off, secondary to blood pressure of  91/40.  Nitroglycerin was restarted at 3 cc an hour, and up to 6 cc after 30 minutes.  Blood pressure remained greater than [id number] systolically, and the chest ain was relieved.  Approximately two hours after nitroglycerin at 6 cc an hour, the  blood pressure, unable to tolerate, and nitroglycerin was gradually decreased to 1 cc an hour, with chest pain recurring but only feeling like an "ache" instead of previous pain.  The systolic blood pressure at that time was 87/38.  The nitroglycerin was then turned off.  The following morning the vital signs were stable.  He continued to be off of IV nitroglycerin.  The patient did complain of a headache with some mild intermittent sharp chest pain, not  like anginal pain over the weekend.  The physical examination was unremarkable except with CPKs and troponins negative, and all other laboratory  within normal limits.  The following morning the vital signs were stable, with no more chest pain, and the examination within normal limits.  The patient is scheduled for high-risk intervention by Dr. Claiborne Billings.  The patient did well during the cardiac catheterization, with unsuccessful rotoblator to the LAD, but successful PTCA to the LIMA to the LAD.  The patient had difficulty after sheath removal with severe bradycardia and hypotension.  Was given 2 mg of IV atropine, with the blood pressure back up to 105/56 and a heart rate in the 80s.  On the day of discharge her vital signs were stable.  The labs were all within normal limits.  The patient had no complaints and the physical examination was unremarkable.  The right groin was without hematoma or ecchymosis.  The patient was discharged after seeing cardiac rehabilitation and ambulating without difficulty.  FOLLOWUP:  The patient was instructed to follow up with Dr. Rex Kras in one weeks  time or sooner if needed. DD:  06/11/99 TD:  06/11/99 Job: [id number]

## 2010-05-21 NOTE — Consult Note (Signed)
[facility]  Patient:    [name], [name] Visit Number: [id number] MRN: [id number]          Service Type: PMG Location: [facility] Attending Physician:  Cloria Spring Dictated by:   Cloria Spring, D.O. Proc. Date: 01/24/01 Admit Date:  01/22/2001   CC:         Delrae Alfred B. Little, M.D.   Consultation Report  Dear Dr. Rex Kras:  Thank you very much for kindly referring [name] to [facility] for evaluation.  The patient was seen in our clinic today.  Please refer to the following for details regarding the history and physical examination and treatment plan.  Once again, thank you for allowing [place] to participate in the care of [name].  CHIEF COMPLAINT:  Anterior chest wall pain.  HISTORY OF PRESENT ILLNESS:  [name] is a pleasant 77 year old male, who states he is status post coronary artery bypass graft surgery in 1995 and as well in 2000.  He has had persistent anterior chest wall pain which is constant in nature.  His pain is a 7/10 and fluctuates with breathing.  His symptoms are not improved with any particular intervention.  Again, his symptoms are worse with taking a deep breath.  He states he has been given morphine in the past for cardiac problems and that did not help his chest wall pain.  He has also been given prednisone and Mobic without relief.  He is not currently taking any pain medications at this time.  His function and quality of life indices have declined, and his sleep is poor.  The patient denies any radicular symptoms in his upper extremities.  Denies upper back pain.  Denies shortness of breath or chronic cough.  I review the health and history form and 14 point review of systems.  PAST MEDICAL HISTORY: 1. Coronary artery disease. 2. Diabetes. 3. Hypertension. 4. Hypercholesterolemia. 5. Migraine headaches.  PAST SURGICAL HISTORY:  CABG x 2.  FAMILY HISTORY:  Heart disease,  diabetes, CABG.  SOCIAL HISTORY:  Denies smoking or alcohol use.  He is married.  ALLERGIES:  IVP DYE.  MEDICATIONS:  Vitamin C, vitamin E, aspirin, Lipitor, Aciphex, Altace, Metformin, niacin, atenolol.  PHYSICAL EXAMINATION:  GENERAL:  A healthy male in no acute distress.  VITAL SIGNS:  Blood pressure 149/86, pulse 62, respirations 20, O2 saturations 98% on room air.  HEART:  Regular rate and rhythm.  LUNGS:  Clear to auscultation bilaterally.  There is proper expansion of the anterior aspect of the ribs with deep inspiration.  PALPATORY EXAMINATION:  Nontender to palpitation of the costochondral junction or the sternochondral junction.  NEUROLOGIC:  Normal neurologic examination of the upper extremities bilaterally.  IMPRESSION: 1. Anterior chest wall pain, etiology uncertain.  Pain seems pleuritic in    nature.  I am wondering if this is possibly secondary to scar tissue    secondary to bypass surgeries. 2. Nonrestorative sleep disorder. 3. History of migraine headaches.  PLAN: 1. Will begin Bextra 20 mg 1 p.o. q.d. #30 without refills. 2. Will begin Topamax 25 mg 1 p.o. q.h.s. #30 without refills.  This will be    for sleep capacity as well as for migraine prophylaxis. 3. Patient to return to clinic in one month for reevaluation.  If patient is    not getting any significant improvement, will consider referring to    Dr. Carlos Levering for second opinion and further management.  The patient  was educated on the above findings and recommendations and understands.  There were no barriers to communication. Dictated by:   Cloria Spring, D.O. Attending Physician:  Cloria Spring DD:  01/24/01 TD:  01/25/01 Job: [id number]

## 2010-05-21 NOTE — Discharge Summary (Signed)
NAMEDAMAURI, [name]               ACCOUNT NO.:  [id number]   MEDICAL RECORD NO.:  [id number]          PATIENT TYPE:  OIB   LOCATION:  [id number]                         FACILITY:  [facility]   PHYSICIAN:  Quay Burow, M.D.   DATE OF BIRTH:  06/04/1932   DATE OF ADMISSION:  11/11/2003  DATE OF DISCHARGE:  11/12/2003                                 DISCHARGE SUMMARY   HISTORY OF PRESENT ILLNESS:  Mr. Schwartzenberge is a patient of Dr. Chase Picket who  came into the hospital for elective PV angiogram.  He has known coronary  disease and peripheral vascular disease with prior history of vertebral  stent by Dr. Estanislado Pandy years ago.  He came to the office with complaints  about headache, left arm discomfort, dizziness, etc.  He was scheduled for  PV angiogram by Dr. Gwenlyn Found.  This was performed on November 11, 2003, and he  was found to have 80% left subclavian artery stenosis, 90% instant  restenosis of his left vertebral stent.  He had some slow competitive flow  to the LIMA.  He subsequently underwent PCI and stenting to his left  subclavian artery.  This was reduced from 80-0%.  Dr.  Estanislado Pandy was  notified because he also had instant restenosis of his left vertebral stent.  He was seen by Dr. Estanislado Pandy on November 12, 2003.  He is to be scheduled for  PCI by him the following week.  On the day of discharge, his blood pressure  was 135/64, pulse 63, respirations 20, temperature 96.7.  His hemoglobin was  14.2, hematocrit 39.4, WBC 15.3, platelets 211,000.  Sodium 136, potassium  3.9, BUN 17, creatinine 1.0, glucose 138.  He was placed on Plavix and was  told to continue his aspirin and Plavix and his other medications as usual.   DISCHARGE MEDICATIONS:  1.  Atenolol 25 mg once daily.  2.  Prinivil 5 mg once daily.  3.  Glucophage 1000 mg b.i.d.  He is to restart that on Friday morning.  4.  __________ 20 mg once daily.  5.  Aspirin 81 mg daily.  6.  Glucotrol XL 10 mg once daily.  7.  Plavix 75 mg  once per day.  8.  Lipitor 40 mg once daily.  9.  Niacin I 1000 mg at bedtime.   DISCHARGE INSTRUCTIONS:  1.  No strenuous activity for 3-4 days.  2.  If he has any problems with his groin, he will call our office.   FOLLOWUP:  He will have follow up with doctors, follow up with Dr. Gwenlyn Found in  2-3 weeks.  Our office will call for these appointments.   ASSESSMENT:  1.  Left arm weakness, dizziness, headache.  2.  Status post PV angiogram secondary to #1 with 80% left subclavian artery      stenosis with subsequent PCI and stenting.  He was also found to have      80% instant restenosis of the left vertebral stent placed by Dr.      Estanislado Pandy.  He has been contacted.  He is to have further intervention  in a week's time.  3.  Coronary artery disease.  4.  Hypertension.  5.  Hyperlipidemia.  6.  Gastroesophageal reflux disease.  7.  Adult onset diabetes mellitus.       BB/MEDQ  D:  11/12/2003  T:  11/12/2003  Job:  [id number]   cc:   Fritz Pickerel. Estanislado Pandy, M.D.  [address]  [place]  [place] [address]  Fax: [phone]   Jeanella Craze. Little, M.D.  [address]  [place]  [place] [address]  Fax: [place]  [address] [place], [place]  [place] [address]  Fax: [phone]

## 2010-05-21 NOTE — Cardiovascular Report (Signed)
[place]  Patient:    JOAN, SAHNI Visit Number: [id number] MRN: [id number]          Service Type: MED Location: [place] 01 Attending Physician:  Thea Alken Dictated by:   Chase Picket, M.D. Proc. Date: 08/28/00 Adm. Date:  08/27/2000   CC:         Rory Percy, M.D., Zollie Scale, M.D.   Cardiac Catheterization  INDICATIONS FOR PROCEDURE:  The patient is a 77 year old male, who had bypass surgery in 1995 and subsequently developed distal difficulty with his internal mammary artery graft in the LAD.  After multiple percutaneous interventions, had re-do surgery with a free left radial graft to the LAD performed June 2001.  He is admitted at this time with anginal pain, very similar to what he complained of prior to his bypass surgery.  Cardiac enzymes are negative.  ECG normal.  PROCEDURES: 1. Left heart catheterization. 2. Selective left coronary arteriography. 3. Ventriculography by hand injection in the right anterior oblique    projection. 4. Graft visualization x4.  At the onset of the procedure after two injections of contrast had been performed, the patient developed bradycardia to a rate of 52.  His pulse had previously in the 80s.  His blood pressure dropped into the low 90s from 120. He felt hot and became diaphoretic.  He did not have chest pain with this. His oxygen saturation dropped from 99% to the upper 80s.  His temperature was 97.1.  His lungs remained free of rales or rhonchi or wheezing and he did not develop a rash.  He was placed on 100% nonrebreather, given 1 mg of atropine, started on IV dopamine at 5 drops and given IV fluids.  In about 5 minutes his blood pressure, heart rate and oxygen saturations stabilized.  Following this, the remainder of the cardiac catheterization was completed and there was no further adverse events.  He had been premedicated with Benadryl. I am concerned  this may have been a contrast mediated reaction.  He had been exposed to contrast multiple times in the past and never had an event like this.  The patient was prepped and draped in the usual sterile fashion exposing the right groin.  Following local anesthetic with 1% Xylocaine the Seldinger technique was employed and a 6 [place] introducer sheath was placed into the right femoral artery.  Selective coronary arteriography was performed.  Graft visualization was performed and evaluation of the internal mammary artery was performed.  Hand injection of the left ventricle using the right coronary catheter was performed also.  RESULTS: 1. Hemodynamic monitoring:  Central aortic pressure at the termination of    the procedure was 134/56.  His left ventricular pressure was 147/18 and    there was an 11 mm valve gradient across the aortic valve. 2. Ventriculography:  Ventriculography revealed normal left ventricular    systolic function.  The end-diastolic pressure was 18 and there was an    ejection fraction of 60%.  Of note, the anterior wall moved normally.  CORONARY ARTERIOGRAPHY:  Calcification was noted in the left main LAD and two stents were noted on fluoroscopy. 1. Left main:  The left main gave rise only to the circumflex.  There was an    area of 80% terminal narrowing in the left main. 2. LAD.  The LAD was 100% occluded at the left main. 3. Circumflex:  The proximal third of the circumflex had a long  tubular    area of 60-70% narrowing.  There was good visualization of the marginal    vessels and there was reflux of contrast all the way up the saphenous vein    graft and in to the aorta.  The saphenous vein graft to the OM was widely    patent and the OM itself was widely patent. 4. Saphenous vein graft to the PDA:  The graft itself was widely patent.  The    posterior descending artery and the posterolateral vessel was widely    patent.  There was reflux of the contrast  retrograde into the entire    right coronary artery and out into the aorta.  There was diffuse 60% areas    of narrowing in the mid and proximal segment. 5. Right radial graft (free graft) to the LAD:  The graft itself appeared to    be widely patent.  On subsequent injections, there appeared to be some    narrowing of the ostium and it is unclear whether not this represented    spasm or whether there was a fixed ostial narrowing; I suspect spasm.  The    LAD was widely patent and there was retrograde flow up the distal portion    of the internal mammary artery graft.  Injections of the left subclavian revealed a 40-50% proximal narrowing of the subclavian.  There appeared to be an 80% ostial narrowing of the vertebral artery.  The internal mammary artery was occluded in its midportion.  CONCLUSIONS: 1. Severe native disease with total occlusion of the left anterior descending    at the ostium of the left main, terminal left main disease, proximal    high-grade stenosis in the circumflex system, and sequential 60% areas    of narrowing in the proximal and mid right coronary artery. 2. Saphenous vein graft to the posterior descending artery was widely    patent.  Saphenous vein graft to the obtuse marginal was widely patent.    Free left radial graft to the left anterior descending appeared to be    patent, although there is some question about the ostium having some degree    of spasm. 3. Left subclavian stenosis of 40-50%. 4. Vertebral ostial narrowing of 80%. 5. A 100% occlusion of the left internal mammary artery, however, this    internal mammary artery had withered and the left anterior descending    had been re-grafted using the left free radial graft. 6. Normal left ventricular systolic function. 7. An 11 mm aortic stenosis. 8. Questionable contrast reaction.  In the future, the patient will need    contrast media prophylaxis treatment for allergy.   DISCUSSION:  The patient  is currently hemodynamically stable.  He is on low-dose dopamine and this will be gradually tapered.  I have made arrangement for a nuclear study in the morning to make sure we cannot demonstrate anterior ischemia.  If in fact there is anterior ischemia, would try percutaneous intervention to the ostium of the free radial graft to the LAD. Dictated by:   Chase Picket, M.D. Attending Physician:  Thea Alken DD:  08/28/00 TD:  08/28/00 Job: [id number]

## 2010-05-21 NOTE — Consult Note (Signed)
[facility]  Patient:    [name], [name] Visit Number: [id number] MRN: [id number]          Service Type: Attending:  Cloria Spring, D.O. Dictated by:   Cloria Spring, D.O. Proc. Date: 06/25/01                            Consultation Report  [name] returns to clinic today for reevaluation.  He was last seen by Dr. Carlos Levering on April 24, 2001.  In the interim the patient has not had any significant change in his pleuritic type chest pain status post CABG.  He has undergone multiple medication trials including NSAIDs, opioids, and non-narcotic alternatives without any relief.  He has undergone thoracic epidural steroid injections without any improvement.  Most recently he has used a TENS unit without any improvement.  Currently, he is not taking any pain medications and has not noticed any significant change in his symptoms. His pain is still a 6/10 on a subjective scale.  Despite this his function is not hindered.  We discuss further medication options.  I would have given some consideration to a stronger nonsteroidal anti-inflammatory such as Indocin; however, patient has a questionable history of GI bleeds and has not tolerated nonsteroidal anti-inflammatory medications in the past so this does not seem to be an option.  We discussed other options which at this point are fairly limited.  Consideration can be given for further manual therapy or acupuncture possibly to modulate patients pain.  I reviewed the health and history form and 14 point review of systems.  PHYSICAL EXAMINATION  GENERAL:  Healthy male in no acute distress.  VITAL SIGNS:  Blood pressure 142/44, pulse 49, respirations 12, O2 saturation 99% on room air.  CHEST:  Evaluation of the anterior chest wall does not reveal any heat, erythema, edema, or ecchymosis.  There is no tenderness to palpation over the anterior chest wall to reproduce patients symptoms.  IMPRESSION:  Pleuritic  chest pain status post coronary artery bypass graft.  PLAN:  At this point I do not have much to offer Mr. [name] in terms of pain management.  I think that consideration should be given for further manual therapy and possibly even acupuncture.  The patient is uncertain whether or not he wants to pursue this course.  I have instructed him to return to clinic on an as needed basis.  If he is interested in pursuing the therapies that I have mentioned, then he may call our clinic and we will set him up with a referral.  Otherwise, I have asked him to maintain contact with his primary care [facility] and cardiologist.  The patient was educated on the above findings and recommendations and understands.  No barriers to communication. Dictated by:   Cloria Spring, D.O. Attending:  Cloria Spring, D.O. DD:  06/25/01 TD:  06/26/01 Job: [id number]

## 2010-05-21 NOTE — Procedures (Signed)
[facility]  Patient:    [name], [name] Visit Number: [id number] MRN: [id number]          Service Type: PMG Location: [facility] Attending Physician:  Cloria Spring Dictated by:   Francia Greaves [name], M.D. Proc. Date: 02/27/01 Admit Date:  01/22/2001                             Procedure Report  PROCEDURE:  SURGEON:  Hans C. [name], M.D.  INDICATIONS:  The patient comes to enter pain management today.  I evaluated and reviewed health and history form, and 14 point review of systems.  I reviewed the medical record, imaging reports, as well as Dr. Laqueta Linden notes.  1. The patient states that medication has not helped him, and that he has not    having adequate functional indices.  He is describing post thoracotomy    syndrome, as well as myofascial pain syndrome, but cannot rule out possible    discogenic pathology in the thoracic spine. 2. I am going to go ahead and order a CT for further investigation. 3. I am going to proceed with thoracic epidural as an interventional approach    as conservative management has not helped him.  Consider TENS technology. 4. We will see him in follow up and predicate any further injections based on    need and responsiveness.  Objectively, he has diffuse perithoracic and myofascial discomfort in ______, extension, and lateral rotation.  He has radicular component consistent with T4 to T6, but no new neurological features, motor, sensory, or reflexes.  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  PLAN:  Thoracic epidural.  He has consented.  We will also sample him with Ultracet and assess whether this is a useful medication for him.  Possibly Mepergan Fortis could be used as a reinforcing medication as well.  I think we find a little better response from the Demerol component in this type of pain. He has consented for todays procedure.  DESCRIPTION OF PROCEDURE:  The patient  was taken to the fluoroscopy suite and placed in the prone position.  The back was prepped and draped in the usual fashion.  Using a Hustead needle, I advanced to the T5-6 interspace without any evidence of CSF, heme, or paresthesia.  Test block uneventfully followed by 40 mg of Aristocort and flushing the needle.  He tolerated the procedure well with no complications from the procedure. Discharge instructions given. Dictated by:   Francia Greaves [name], M.D. Attending Physician:  Cloria Spring DD:  02/27/01 TD:  02/27/01 Job: [id number]

## 2010-05-21 NOTE — Discharge Summary (Signed)
[place]. [facility]  Patient:    [name], [name]                      MRN: [id number] Adm. Date:  [id number] Disc. Date: 07/05/99 Attending:  Len Childs Dictator:   Sharene Butters, P.A. CC:         Jeanella Craze. Little, M.D.                           Discharge Summary  DATE OF BIRTH:  July 16, 1932.  DISCHARGE DIAGNOSES: 1. Severe recurrent class IV progressive angina, resolved. 2. Coronary artery disease, status post redo coronary artery bypass grafts in    the left radial artery. 3. Adult-onset diabetes mellitus, non-insulin dependent. 4. Hypertension. 5. Hypercholesterolemia. 6. Gastroesophageal reflux disease symptoms. 7. Pericarditis, resolved.  PROCEDURES:  Status post redo coronary artery bypass graft x 1 using left radial artery graft to the distal left anterior descending on June 30, 1999 by Dr. Tharon Aquas Trigt III.  COMPLICATIONS:  None.  CONSULTANTS:  None.  MEDICATIONS ON DISCHARGE: 1. Altace 1.25 mg one p.o. q.d. 2. Protonix 40 mg one p.o. q.d. 3. Imdur 30 mg one p.o. q.d. 4. Atenolol 25 mg a half p.o. b.i.d. 5. Tylox one or two p.o. q.4-6h. p.r.n. for pain.  ALLERGIES:  No known drug allergies.  FOLLOWUP:  Follow up with Dr. Delrae Alfred B. Little in two weeks after discharge; Dr. Prescott Gum three weeks after discharge.  HOSPITAL COURSE:  The patient is a pleasant 77 year old male, patient of Dr. Rex Kras, who had previously undergone four-vessel coronary artery bypass grafting by Dr. Denton Meek. Wilson over six years ago.  The patient presented with recurrent chest pain and cardiac catheterization demonstrated stenosis at the left anterior descending-mammary artery anastomosis.  This was treated percutaneously but with subsequent recurrent stenosis and he was referred for redo bypass grafting.  On June 30, 1999, the patient underwent the procedure using the left radial artery to the LAD by Dr. Prescott Gum, with no complications.  He  was transferred to the SICU after surgery in stable condition.  On POD #1, his EKG revealed ST elevation, which was diffuse.  On physical exam, rubs were heard.  The patient had developed pericarditis and he was to be monitored on the following day.  On POD #2, the friction rub was resolved, as well as his acute pericarditis.  Overall, his condition was improved, his vital signs were stable and he was continuing to be in normal sinus rhythm.  He was transferred to the unit 2000 in stable condition.  On POD #3, his chest x-ray showed hemidiaphragm on the right side, which was present on the previous day, with no significant changes.  He also complained of some pain at the pacing wire site, but during physical exam, no inflammation was seen around the area and his abdominal exam as well as his chest exam were unremarkable.  His labs were within normal limits, with no indication of an infection.  His WBC was stable at 7.8 and he had slight postop anemia, which was stable.  On POD #4, his condition continued to improve.  His vital signs were stable, normal sinus rhythm, he was diuresing well and his condition overall was much improved.  It is anticipated that the patient will be discharged on July 05, 1999 in a stable condition.  Pacing wires are going to be removed on  July 04, 1999, and the patient will be continued to be monitored for any changes prior to discharge. DD:  07/04/99 TD:  07/04/99 Job: [id number]

## 2010-05-21 NOTE — Cardiovascular Report (Signed)
[place]  Patient:    [name], [name]                      MRN: [id number] Proc. Date: 04/13/99 Adm. Date:  [id number] Attending:  Lawana Pai CC:         Rory Percy, M.D., [facility]                        Cardiac Catheterization  PROCEDURES: 1. Left heart catheterization. 2. Selective right and left coronary arteriography. 3. Graft visualization x 3 including saphenous vein graft x 2 and    internal mammary artery x 1. 4. Complex angioplasty to the distal anastomotic site of the internal mammary    artery graft going through the internal mammary artery.  DESCRIPTION OF PROCEDURE:  The patient was prepared and draped in the usual sterile fashion exposing the right groin.  Following local anesthetic with 1% Xylocaine the Seldinger technique was employed, and a 6 [place] introducer sheath was placed into the right femoral artery.  Selective right and left coronary arteriography and graft visualization was performed.  Ventriculography in the RAO projection was hen performed.  Following this, angioplasty to the LAD.  RESULTS: 1. Hemodynamic monitoring:  Central aortic pressure 140/74, left    ventricular pressure 138/22 and there was no significant aortic valve gradient    noted at the time of pullback. 2. Ventriculography:  Ventriculography in the RAO projection using 25 cc    of contrast at 12 cc/sec revealed good opacification of left ventricle,    normal left ventricular systolic function.  No mitral regurgitation.    End-diastolic pressure was 20.  Ejection fraction was 60% or greater.  AORTIC ROOT INJECTION:  Aortic root injection was performed because the LAD was  never visualized.  There was no visualization of the LAD with an aortic root injection either.  CORONARY ARTERIOGRAPHY: 1. Left main:  Normal. 2. LAD.  The LAD was 100% occluded at the left main.  The two stents were noted  on fluoroscopy. 3. Circumflex:  The  circumflex had an area of proximal 60% narrowing.  With    injection of the native circumflex there was retrograde flow all the way    through the saphenous vein graft and into the aorta. 4. Right coronary artery:  The right coronary artery is diffusely diseased in the    proximal and mid segments.  The distal vessel was free of disease.  Again,    retrograde flow into the saphenous vein graft and all the way into the aorta  was noted.  The distal RCA was normal.  GRAFT VISUALIZATION: 1. Saphenous vein graft to the OM.  The graft was widely patent. The OM was normal. 2. Saphenous vein graft to the PDA.  The graft was widely patent.  The PDA and    posterolateral branches were free of disease.  There was reflux into the aorta    through the native right coronary artery.     Multiple septal perforators were noted via the right coronary artery but the LAD    itself was not visualized.  INTERNAL MAMMARY ARTERY:  The internal mammary artery was weathered in August of 2000, but now it supplies the LAD.  There is retrograde flow into the LAD all the way up to the proximal stent which is 100% occluded.  The mid stent has in-stent re-stenosis of 60%.  The distal anastomotic site  appears to be right at the last strut in the second stent.  There is a 90% narrowing in the distal anastomotic site.  The LAD itself was a small vessel about 2.5 mm.  Because of the above-mentioned anatomy, arrangements were made for angioplasty.  A 6 French internal mammary artery catheter was placed into the internal mammary artery.  2.5. graft ACE catheter was then gradually advanced through a very tortuous internal mammary artery to the anastomotic site.  It would not cross.  The the graft ACE still in the internal mammary artery, a luge wire was passed down beside the graft ACE into the distal internal mammary artery.  The graft ACE was then removed.  After multiple attempts, the luge wire finally passed  through the distal anastomotic site and then into the distal LAD.  Once this was achieved, a 1.5 x 20 mm Ranger balloon was passed into the area of the obstruction and two inflations, both 12 x 62 were performed.  The balloon was then exchanged out for a 2.0 x 20 Ranger and a single inflation of 10 atmospheres for 20 seconds resulted in retro of the balloon.  It was removed without any difficulty.  Then a 2.5 x 20 Ranger was placed, two inflations, 8 x 52 and 6 x 62 were performed.  After the  intervention the vessel appeared to be less than 30% narrowed.  There was brisk  TIMI-3 flow.  A diagonal, which was not visualized prior to the intervention was not filling antegrade.  The patient was treated with IV heparin only and unfortunately he did not have ny chest pain during the inflations which makes me concerned that his complaints of chest pain that brought him to the hospital are probably not related to his CAD.  The sheath will be removed later today and the patient should be discharged tomorrow.  It should be pointed out that right before the ventriculogram was performed, the initial angiographic suite we were using went down and the patient had to be moved into a second angiographic suite for the completion of the diagnostic test and he intervention. DD:  04/13/99 TD:  04/14/99 Job: [id number]

## 2010-05-21 NOTE — Discharge Summary (Signed)
[place]  Patient:    [name], [name] Visit Number: [id number] MRN: [id number]          Service Type: MED Location: [phone] Attending Physician:  Lawana Pai Dictated by:   Verlon Au, N.P. Admit Date:  08/27/2000 Discharge Date: 09/01/2000                             Discharge Summary  ADMISSION DIAGNOSES: 1. Recurrent chest pain, rule out myocardial infarction. 2. Coronary artery disease; status post coronary artery bypass graft in 1995,    status post redo coronary artery bypass graft to the left anterior    descending June 2001. 3. Hyperlipidemia. 4. Hypertension. 5. Diabetes.  DISCHARGE DIAGNOSES: 1. Coronary artery disease, status post coronary artery bypass graft 1995 with    redo coronary artery bypass graft June 2001. 2. Status post stent placement to vertebral artery August 31, 2000. 3. Hyperlipidemia. 4. Hypertension. 5. Diabetes.  PROCEDURES: 1. Left heart catheterization. 2. Stent placement to vertebral artery August 31, 2000.  COMPLICATIONS:  None.  DISCHARGE STATUS:  Stable.  ADMISSION HISTORY:  This is a 77 year old white male who was admitted to [facility] with complaint of chest pain.  He was transferred to this facility from [facility] in [place] for further evaluation and treatment of chest pain and known CAD.  He began having left anterior chest pain the day prior to admission to [facility], which was August 26, 2000.  Cardiac enzymes at [facility] were apparently negative.  He was treated with IV nitroglycerin and IV heparin and then transferred to [facility] for further treatment and evaluation.  He has known CAD and is status post CABG x 3 in 1995 with a LIMA to the LAD, SVG to the OM, and an SVG to the RCA.  He apparently had a redo CABG on June 21 to LAD.  PHYSICAL EXAMINATION ON ADMISSION:  VITAL SIGNS:  Blood pressure 110/70, pulse 64, respirations 18.  NECK:  No  JVD or carotid bruits noted.  LUNGS:  Clear to auscultation bilaterally.  HEART:  Regular rate and rhythm with a 1/6 systolic ejection murmur noted.  ABDOMEN:  Normal bowel sounds and no hepatosplenomegaly.  EXTREMITIES:  No lower extremity edema.  LABORATORY DATA:  EKG showed a normal sinus rhythm with no acute ST or T wave changes to suggest ischemia.  Admission labs were normal with a total CK of 46.  Troponin 0.03.  HOSPITAL COURSE:  The patient was admitted with unstable angina, rule out ischemia.  He was started on Plavix.  IV nitroglycerin and heparin were continued.  Serial enzymes were checked and set up for repeat cardiac catheterization.  On August 28, 2000, left heart catheterization was performed.  Results showed patent grafts with the exception of an area of questionable ostial narrowing to the free radical graft from redo CABG to the LAD.  There was also a noted 80% ostial narrowing to the vertebral artery. LIMA had 100% area mid.  Normal LV function was noted with an EF of 60%.  Nuclear study was scheduled for the next morning to rule out any problems with anterior ischemia.  If this was noted, then there was consideration to try PCI to the ostium of that graft to the LAD.  Radiology consult was sought at this point for possible treatment and intervention to the ostial narrowing of the vertebral artery.  Dr. Estanislado Pandy saw  the patient and agreed to intervene.  On August 29, 2000, Persantine Cardiolite study was performed.  This was negative for ischemia.  The patient tolerated the procedure well.  Vital signs remained stable and the patient remained pain free.  On August 30, 2000, the patient had a 2.4, almost 2.5, second pause.  He apparently was asymptomatic and blood pressure was stable during this event. His dose of beta-blocker was decreased at this point.  Angiography was scheduled with Dr. Estanislado Pandy in radiology for the morning of August 31, 2000.  The  patient had a mild contrast dye reaction during cardiac catheterization and contrast allergy prophylaxis was ordered.  The patient underwent angiography and stent placement to the vertebral artery on August 31, 2000.  He tolerated the procedure well without any complications.  Vital signs remained stable and he was placed on ReoPro.  The patient essentially without any complaints for the rest of his hospitalization.  Vital signs remained stable.  Labs were within normal limits with the exception of very mild hypokalemia, which responded with replacement. He was discharged home on September 01, 2000.  DISCHARGE MEDICATIONS: 1. Niaspan 500 mg at h.s. 2. Zocor 20 mg q.d. 3. Aspirin 325 mg q.d. 4. Lisinopril 5 mg q.d. 5. Prilosec 20 mg q.d. 6. Atenolol 25 mg 1/2 q.d. 7. Plavix 75 mg q.d. x 6 weeks. 8. Glucophage 500 mg q.d.  DISCHARGE INSTRUCTIONS:  He is not to resume taking his Glucophage until Monday, September 04, 2000.  He is not to undergo any strenuous activity for at least the next 3-4 days.  He is to maintain a low-salt/low-fat/low-cholesterol diet, as well as diabetic diet restrictions.  He is to follow up with Dr. Rex Kras four weeks after discharge.  He is to call for an appointment. Dictated by:   Verlon Au, N.P. Attending Physician:  Lawana Pai DD:  09/12/00 TD:  09/12/00 Job: [id number]

## 2010-05-21 NOTE — Cardiovascular Report (Signed)
NAMEJAYSHON, [name]               ACCOUNT NO.:  [id number]   MEDICAL RECORD NO.:  [id number]          PATIENT TYPE:  OIB   LOCATION:  [id number]                         FACILITY:  [facility]   PHYSICIAN:  Quay Burow, M.D.   DATE OF BIRTH:  Dec 11, 1932   DATE OF PROCEDURE:  11/11/2003  DATE OF DISCHARGE:                              CARDIAC CATHETERIZATION   PROCEDURE:  Peripheral angiogram, percutaneous transluminal angioplasty and  stent.   HISTORY:  Mr. [name] is a 77 year old gentleman who has had bypass surgery in  1995 with redo in 2001.  He has had a left vertebral stent placed by Dr.  Estanislado Pandy in 2002.  He has had a loud subclavian bruit noted with Dopplers  suggesting left subclavian artery stenosis.  He is symptomatic from this.  He presents now for angiography and potential intervention.   DESCRIPTION OF PROCEDURE:  The patient was brought to the sixth floor [facility] Angiographic suite in a postabsorptive state.  He  was premedicated with p.o. Valium.  His right groin was prepped and draped  in the usual sterile fashion.  1% Xylocaine was used for local anesthesia.  A 5 French sheath was inserted into the right femoral artery using standard  Seldinger technique. A 5 French long pigtail catheter was used for arch  angiography in the LAO view distal abdominal aortography.  A long 5 French  right Judkins catheter was used for selective left subclavian artery  angiography and selective left vertebral and IMA angiography.  Visipaque dye  was used for the entirety of the case.  Pressure was monitored during the  case.   ANGIOGRAPHIC RESULTS:  1.  Arch aortogram.      1.  All arch vessels were intact.  There was an 80% proximal left          subclavian artery stenosis.  In addition, there appeared to be an at          least 90% ostial left vertebral artery in stent restenosis and          slow competitive flow down the IMA.  2.  Distal abdominal aortography.   The distal abdominal aortogram was      performed using 20 mL of Visipaque dye at 20 mL per second.  The renal      arteries were widely patent.  The infrarenal abdominal aorta and the      iliac bifurcation appeared free of significant atherosclerotic changes.   Mr. [name] has had successful PTA and stenting of a stenosed left subclavian  artery with symptomatic upper extremity claudication and dizziness.  He did  receive 3000 units of heparin prior to intervention.  An 0.035 Wholey wire  was advanced through the Judkins catheter into the distal left subclavian  artery.  The existing 5 French sheath in the right femoral artery was  exchanged for a 90 cm long 7 French sheath.  This was then placed just  proximal to the stenosis in the left subclavian and this was documented  angiographically.  A 7 by 18 Genesis on Opta balloon was  then used to  primarily PTA and stent the left subclavian artery under careful  fluoroscopic and angiographic guidance to the distal edge of the stent  landing just proximal to the left vertebral stent.  The patient tolerated  the procedure well.  The final result was reduction of 80% proximal left  subclavian artery stenosis to 0% residual.  At the end of the case, there  was improved blood flow in both the vertebral and internal mammary artery.   IMPRESSION:  Successful PTA and stenting of the left subclavian artery.  The  patient has residual disease in his vertebral artery stent.  The ACT was  measured, the sheath was removed.  Pressure was held on the groin to achieve  hemostasis.  The patient tolerated the procedure well.  He will be treated  with aspirin and Plavix.  Dr. Estanislado Pandy will be notified of the patient's  admission and will view the angiograms and make further recommendation.  The  patient left the lab in stable condition.  Dr. Rex Kras was notified.       JB/MEDQ  D:  11/11/2003  T:  11/11/2003  Job:  [id number]   cc:   Jeanella Craze. Little, M.D.   [address]  [place]  [place] [address]  Fax: [place]  [address] [place], [place]  [place] [address]  Fax: [phone]

## 2010-05-21 NOTE — Consult Note (Signed)
[facility]  Patient:    [name] Visit Number: [id number] MRN: [id number]          Service Type: MED Location: [phone] Attending Physician:  Lawana Pai Dictated by:   Francia Greaves. Carlos Levering, M.D. Admit Date:  02/06/2001 Discharge Date: 02/07/2001                            Consultation Report  F…[name].  I evaluate and review health and history form, 14 point review of systems.  We are still awaiting the bone scan which I think will be very informative. He has this scheduled next week.  The Duragesic patch has helped him to a limited degree and I do think we need to escalate to 50 mcg with full disclosure and review of patient care agreement and he understands our overall directed patient approach and care plan.  I am not going to go ahead and proceed with another thoracic epidural.  I would at least like to have the bone scan and apparently his primary care doctor is starting to work him up from a GI perspective.  I think this will be very helpful and I am going to hold off any interventional procedures until we have a better assessment in this regard.  I have not seen the response I need to proceed with a third injection and I may consider this at a later date, but again, our database needs to be enhanced.  I am going to increase him to 50 mcg of Duragesic which should bridge him.  He does have breakthrough medication.  States no wish to harm self or others.  The 14 point review of systems, health and history form reviewed.  I do not think any other imaging or diagnostics are warranted.  He does have a relationship with primary care.  OBJECTIVE:  BACK:  He has diffuse perithoracic myofascial discomfort, impaired flexion/extension, lateral rotational pain but this has not changed.  NEUROLOGIC:  He has no new neurologic features, motor, sensory, reflexive.  IMPRESSION:   Degenerative spinal disease thoracic spine, degenerative spinal disease lumbar spine, pain of pleuritic nature.  PLAN:  Conservative management at this time.  Enhance our database.  Discharge instructions given.  Extensive consultation. Dictated by:   Francia Greaves Carlos Levering, M.D. Attending Physician:  Lawana Pai DD:  03/27/01 TD:  03/28/01 Job: [id number]

## 2010-09-07 ENCOUNTER — Encounter (HOSPITAL_COMMUNITY): Payer: Self-pay | Admitting: Radiology

## 2010-09-07 ENCOUNTER — Emergency Department (HOSPITAL_COMMUNITY)
Admission: EM | Admit: 2010-09-07 | Discharge: 2010-09-07 | Disposition: A | Discharge status: Home / Self Care | Payer: Medicare Other | Attending: Emergency Medicine | Admitting: Emergency Medicine

## 2010-09-07 ENCOUNTER — Emergency Department (HOSPITAL_COMMUNITY): Payer: Medicare Other

## 2010-09-07 DIAGNOSIS — R197 Diarrhea, unspecified: Secondary | ICD-10-CM | POA: Insufficient documentation

## 2010-09-07 DIAGNOSIS — R112 Nausea with vomiting, unspecified: Secondary | ICD-10-CM | POA: Insufficient documentation

## 2010-09-07 DIAGNOSIS — I252 Old myocardial infarction: Secondary | ICD-10-CM | POA: Insufficient documentation

## 2010-09-07 DIAGNOSIS — E7800 Pure hypercholesterolemia, unspecified: Secondary | ICD-10-CM | POA: Insufficient documentation

## 2010-09-07 DIAGNOSIS — I10 Essential (primary) hypertension: Secondary | ICD-10-CM | POA: Insufficient documentation

## 2010-09-07 DIAGNOSIS — R1013 Epigastric pain: Secondary | ICD-10-CM | POA: Insufficient documentation

## 2010-09-07 DIAGNOSIS — I2510 Atherosclerotic heart disease of native coronary artery without angina pectoris: Secondary | ICD-10-CM | POA: Insufficient documentation

## 2010-09-07 DIAGNOSIS — Z951 Presence of aortocoronary bypass graft: Secondary | ICD-10-CM | POA: Insufficient documentation

## 2010-09-07 DIAGNOSIS — E119 Type 2 diabetes mellitus without complications: Secondary | ICD-10-CM | POA: Insufficient documentation

## 2010-09-07 LAB — GLUCOSE, CAPILLARY: Glucose-Capillary: 129 mg/dL — ABNORMAL HIGH (ref 70–99)

## 2010-09-07 LAB — POCT I-STAT, CHEM 8
Calcium, Ion: 1.22 mmol/L (ref 1.12–1.32)
Glucose, Bld: 123 mg/dL — ABNORMAL HIGH (ref 70–99)
HCT: 42 % (ref 39.0–52.0)
Hemoglobin: 14.3 g/dL (ref 13.0–17.0)

## 2010-09-07 LAB — LIPASE, BLOOD: Lipase: 24 U/L (ref 11–59)

## 2010-09-07 MED ORDER — IOHEXOL 300 MG/ML  SOLN
80.0000 mL | Freq: Once | INTRAMUSCULAR | Status: AC | PRN
  Administered 2010-09-07: 80 mL via INTRAVENOUS

## 2010-09-27 LAB — URINALYSIS, ROUTINE W REFLEX MICROSCOPIC
Hgb urine dipstick: NEGATIVE
Nitrite: POSITIVE — AB
Specific Gravity, Urine: 1.027
Urobilinogen, UA: 0.2
pH: 5.5

## 2010-09-27 LAB — POCT CARDIAC MARKERS
CKMB, poc: <1 — ABNORMAL LOW
Troponin i, poc: <0.05

## 2010-09-27 LAB — I-STAT 8, (EC8 V) (CONVERTED LAB)
Acid-base deficit: 2
Bicarbonate: 24.6 — ABNORMAL HIGH
Glucose, Bld: 85
Hemoglobin: 12.6 — ABNORMAL LOW
Operator id: 151321
Sodium: 140
TCO2: 26

## 2010-09-27 LAB — DIFFERENTIAL
Basophils Absolute: 0
Basophils Relative: 0
Eosinophils Absolute: 0
Monocytes Relative: 9
Neutrophils Relative %: 83 — ABNORMAL HIGH

## 2010-09-27 LAB — URINE MICROSCOPIC-ADD ON

## 2010-09-27 LAB — CBC
MCHC: 33.5
MCV: 88.7
Platelets: 179
RDW: 12.3

## 2010-09-27 LAB — BASIC METABOLIC PANEL
BUN: 14
CO2: 26
Calcium: 9.1
Chloride: 106
Creatinine, Ser: 0.89
Glucose, Bld: 89

## 2010-09-27 LAB — URINE CULTURE

## 2010-09-27 LAB — POCT I-STAT CREATININE: Operator id: 151321

## 2011-04-14 DIAGNOSIS — R079 Chest pain, unspecified: Secondary | ICD-10-CM

## 2011-04-19 ENCOUNTER — Other Ambulatory Visit (HOSPITAL_COMMUNITY): Payer: Self-pay | Admitting: Cardiology

## 2011-04-21 ENCOUNTER — Ambulatory Visit (HOSPITAL_COMMUNITY)
Admission: RE | Admit: 2011-04-21 | Discharge: 2011-04-21 | Disposition: A | Discharge status: Home / Self Care | Payer: Medicare Other | Source: Ambulatory Visit | Attending: Cardiology | Admitting: Cardiology

## 2011-04-21 ENCOUNTER — Encounter (HOSPITAL_COMMUNITY): Payer: Self-pay

## 2011-04-21 DIAGNOSIS — J438 Other emphysema: Secondary | ICD-10-CM | POA: Insufficient documentation

## 2011-04-21 DIAGNOSIS — R634 Abnormal weight loss: Secondary | ICD-10-CM | POA: Insufficient documentation

## 2011-04-21 DIAGNOSIS — R079 Chest pain, unspecified: Secondary | ICD-10-CM | POA: Insufficient documentation

## 2011-04-21 MED ORDER — IOHEXOL 350 MG/ML SOLN
80.0000 mL | Freq: Once | INTRAVENOUS | Status: AC | PRN
  Administered 2011-04-21: 80 mL via INTRAVENOUS

## 2011-04-21 NOTE — Discharge Instructions (Signed)
Metformin and X-ray Contrast Studies For some X-ray exams, a contrast dye is used. Contrast dye is a type of medicine used to make the X-ray image clearer. The contrast dye is given to the patient through a vein (intravenously). If you need to have this type of X-ray exam and you take a medication called metformin, your caregiver may have you stop taking metformin before the exam.  LACTIC ACIDOSIS In rare cases, a serious medical condition called lactic acidosis can develop in people who take metformin and receive contrast dye. The following conditions can increase the risk of this complication:   Kidney failure.   Liver problems.   Certain types of heart problems such as:   Heart failure.   Heart attack.   Heart infection.   Heart valve problems.   Alcohol abuse.  If left untreated, lactic acidosis can lead to coma.  SYMPTOMS OF LACTIC ACIDOSIS Symptoms of lactic acidosis can include:  Rapid breathing (hyperventilation).   Neurologic symptoms such as:   Headaches.   Confusion.   Dizziness.   Excessive sweating.   Feeling sick to your stomach (nauseous) or throwing up (vomiting).  AFTER THE X-RAY EXAM  Stay well-hydrated. Drink fluids as instructed by your caregiver.   If you have a risk of developing lactic acidosis, blood tests may be done to make sure your kidney function is okay.   Metformin is usually stopped for 48 hours after the X-ray exam. Ask your caregiver when you can start taking metformin again.  SEEK MEDICAL CARE IF:   You have shortness of breath or difficulty breathing.   You develop a headache that does not go away.   You have nausea or vomiting.   You urinate more than normal.   You develop a skin rash and have:   Redness.   Swelling.   Itching.  Document Released: 12/08/2008 Document Revised: 12/09/2010 Document Reviewed: 12/08/2008 [facility] Patient Information 2012 [place].

## 2011-08-16 ENCOUNTER — Other Ambulatory Visit: Payer: Self-pay | Admitting: Cardiology

## 2011-08-19 ENCOUNTER — Encounter (HOSPITAL_COMMUNITY): Payer: Self-pay | Admitting: Pharmacy Technician

## 2011-08-22 ENCOUNTER — Ambulatory Visit (HOSPITAL_COMMUNITY)
Admission: RE | Admit: 2011-08-22 | Discharge: 2011-08-22 | Disposition: A | Discharge status: Home / Self Care | Payer: Medicare Other | Source: Ambulatory Visit | Attending: Cardiology | Admitting: Cardiology

## 2011-08-22 ENCOUNTER — Encounter (HOSPITAL_COMMUNITY)
Admission: RE | Disposition: A | Discharge status: Home / Self Care | Payer: Self-pay | Source: Ambulatory Visit | Attending: Cardiology

## 2011-08-22 ENCOUNTER — Encounter (HOSPITAL_COMMUNITY): Payer: Self-pay | Admitting: Cardiology

## 2011-08-22 DIAGNOSIS — E782 Mixed hyperlipidemia: Secondary | ICD-10-CM | POA: Diagnosis present

## 2011-08-22 DIAGNOSIS — E119 Type 2 diabetes mellitus without complications: Secondary | ICD-10-CM | POA: Insufficient documentation

## 2011-08-22 DIAGNOSIS — Y831 Surgical operation with implant of artificial internal device as the cause of abnormal reaction of the patient, or of later complication, without mention of misadventure at the time of the procedure: Secondary | ICD-10-CM | POA: Insufficient documentation

## 2011-08-22 DIAGNOSIS — E1169 Type 2 diabetes mellitus with other specified complication: Secondary | ICD-10-CM | POA: Diagnosis present

## 2011-08-22 DIAGNOSIS — I10 Essential (primary) hypertension: Secondary | ICD-10-CM | POA: Insufficient documentation

## 2011-08-22 DIAGNOSIS — E785 Hyperlipidemia, unspecified: Secondary | ICD-10-CM | POA: Diagnosis present

## 2011-08-22 DIAGNOSIS — R079 Chest pain, unspecified: Secondary | ICD-10-CM | POA: Insufficient documentation

## 2011-08-22 DIAGNOSIS — I2510 Atherosclerotic heart disease of native coronary artery without angina pectoris: Secondary | ICD-10-CM | POA: Insufficient documentation

## 2011-08-22 DIAGNOSIS — T82897A Other specified complication of cardiac prosthetic devices, implants and grafts, initial encounter: Secondary | ICD-10-CM | POA: Insufficient documentation

## 2011-08-22 HISTORY — DX: Atherosclerosis of coronary artery bypass graft(s) without angina pectoris: I25.810

## 2011-08-22 HISTORY — PX: LEFT HEART CATHETERIZATION WITH CORONARY/GRAFT ANGIOGRAM: SHX5450

## 2011-08-22 LAB — GLUCOSE, CAPILLARY
Glucose-Capillary: 155 mg/dL — ABNORMAL HIGH (ref 70–99)
Glucose-Capillary: 199 mg/dL — ABNORMAL HIGH (ref 70–99)
Glucose-Capillary: 303 mg/dL — ABNORMAL HIGH (ref 70–99)

## 2011-08-22 SURGERY — LEFT HEART CATHETERIZATION WITH CORONARY/GRAFT ANGIOGRAM
Anesthesia: LOCAL

## 2011-08-22 MED ORDER — LIDOCAINE HCL (PF) 1 % IJ SOLN
INTRAMUSCULAR | Status: AC
  Filled 2011-08-22: qty 30

## 2011-08-22 MED ORDER — PREDNISONE 20 MG PO TABS: 50.0000 mg | ORAL_TABLET | Freq: Once | ORAL | Status: DC

## 2011-08-22 MED ORDER — LABETALOL HCL 5 MG/ML IV SOLN
10.0000 mg | INTRAVENOUS | Status: AC
  Administered 2011-08-22: 10 mg via INTRAVENOUS
  Filled 2011-08-22: qty 4

## 2011-08-22 MED ORDER — TIMOLOL MALEATE 0.5 % OP SOLN: 1.0000 [drp] | Freq: Every day | OPHTHALMIC | Status: DC

## 2011-08-22 MED ORDER — ATORVASTATIN CALCIUM 40 MG PO TABS: 40.0000 mg | ORAL_TABLET | Freq: Every day | ORAL | Status: DC

## 2011-08-22 MED ORDER — SODIUM CHLORIDE 0.9 % IV SOLN: 1.0000 mL/kg/h | INTRAVENOUS | Status: DC

## 2011-08-22 MED ORDER — MIDAZOLAM HCL 2 MG/2ML IJ SOLN
INTRAMUSCULAR | Status: AC
  Filled 2011-08-22: qty 2

## 2011-08-22 MED ORDER — FUROSEMIDE 10 MG/ML IJ SOLN
20.0000 mg | INTRAMUSCULAR | Status: AC
  Administered 2011-08-22: 20 mg via INTRAVENOUS
  Filled 2011-08-22: qty 4

## 2011-08-22 MED ORDER — BUTALBITAL-APAP-CAFFEINE 50-325-40 MG PO TABS
2.0000 | ORAL_TABLET | Freq: Once | ORAL | Status: AC
  Administered 2011-08-22: 2 via ORAL
  Filled 2011-08-22: qty 2

## 2011-08-22 MED ORDER — FUROSEMIDE 10 MG/ML IJ SOLN: 20.0000 mg | Freq: Once | INTRAMUSCULAR | Status: DC

## 2011-08-22 MED ORDER — LABETALOL HCL 5 MG/ML IV SOLN
10.0000 mg | Freq: Once | INTRAVENOUS | Status: AC
  Administered 2011-08-22: 10 mg via INTRAVENOUS
  Filled 2011-08-22: qty 4

## 2011-08-22 MED ORDER — CARVEDILOL 3.125 MG PO TABS
3.1250 mg | ORAL_TABLET | Freq: Once | ORAL | Status: AC
  Administered 2011-08-22: 3.125 mg via ORAL
  Filled 2011-08-22: qty 1

## 2011-08-22 MED ORDER — DIPHENHYDRAMINE HCL 50 MG/ML IJ SOLN
25.0000 mg | INTRAMUSCULAR | Status: AC
  Administered 2011-08-22: 25 mg via INTRAVENOUS

## 2011-08-22 MED ORDER — NITROGLYCERIN 0.2 MG/ML ON CALL CATH LAB
INTRAVENOUS | Status: AC
  Filled 2011-08-22: qty 1

## 2011-08-22 MED ORDER — LABETALOL HCL 5 MG/ML IV SOLN: 10.0000 mg | Freq: Once | INTRAVENOUS | Status: DC

## 2011-08-22 MED ORDER — FAMOTIDINE IN NACL 20-0.9 MG/50ML-% IV SOLN
20.0000 mg | INTRAVENOUS | Status: AC
  Administered 2011-08-22: 20 mg via INTRAVENOUS
  Filled 2011-08-22: qty 50

## 2011-08-22 MED ORDER — CARVEDILOL 3.125 MG PO TABS: 3.1250 mg | ORAL_TABLET | Freq: Two times a day (BID) | ORAL | Status: DC

## 2011-08-22 MED ORDER — METHYLPREDNISOLONE SODIUM SUCC 125 MG IJ SOLR
125.0000 mg | INTRAMUSCULAR | Status: AC
  Administered 2011-08-22: 125 mg via INTRAVENOUS

## 2011-08-22 MED ORDER — LISINOPRIL 20 MG PO TABS: 20.0000 mg | ORAL_TABLET | Freq: Every day | ORAL | Status: DC

## 2011-08-22 MED ORDER — GLIPIZIDE 5 MG PO TABS
5.0000 mg | ORAL_TABLET | Freq: Every day | ORAL | Status: DC
  Administered 2011-08-22: 5 mg via ORAL
  Filled 2011-08-22 (×2): qty 1

## 2011-08-22 MED ORDER — ONDANSETRON HCL 4 MG/2ML IJ SOLN: 4.0000 mg | Freq: Four times a day (QID) | INTRAMUSCULAR | Status: DC | PRN

## 2011-08-22 MED ORDER — SODIUM CHLORIDE 0.9 % IV SOLN: INTRAVENOUS | Status: DC

## 2011-08-22 MED ORDER — FENTANYL CITRATE 0.05 MG/ML IJ SOLN
INTRAMUSCULAR | Status: AC
  Filled 2011-08-22: qty 2

## 2011-08-22 MED ORDER — SODIUM CHLORIDE 0.9 % IJ SOLN: 3.0000 mL | INTRAMUSCULAR | Status: DC | PRN

## 2011-08-22 MED ORDER — ASPIRIN EC 81 MG PO TBEC: 81.0000 mg | DELAYED_RELEASE_TABLET | Freq: Every day | ORAL | Status: DC

## 2011-08-22 MED ORDER — DIPHENHYDRAMINE HCL 50 MG/ML IJ SOLN
INTRAMUSCULAR | Status: AC
  Administered 2011-08-22: 25 mg via INTRAVENOUS
  Filled 2011-08-22: qty 1

## 2011-08-22 MED ORDER — METHYLPREDNISOLONE SODIUM SUCC 125 MG IJ SOLR
INTRAMUSCULAR | Status: AC
  Administered 2011-08-22: 125 mg via INTRAVENOUS
  Filled 2011-08-22: qty 2

## 2011-08-22 MED ORDER — CLOPIDOGREL BISULFATE 75 MG PO TABS: 75.0000 mg | ORAL_TABLET | Freq: Every day | ORAL | Status: DC

## 2011-08-22 MED ORDER — ACETAMINOPHEN 325 MG PO TABS: 650.0000 mg | ORAL_TABLET | ORAL | Status: DC | PRN

## 2011-08-22 MED ORDER — METFORMIN HCL 1000 MG PO TABS: 1000.0000 mg | ORAL_TABLET | Freq: Two times a day (BID) | ORAL | Status: DC

## 2011-08-22 MED ORDER — GABAPENTIN 300 MG PO CAPS: 300.0000 mg | ORAL_CAPSULE | Freq: Three times a day (TID) | ORAL | Status: DC

## 2011-08-22 MED ORDER — HEPARIN (PORCINE) IN NACL 2-0.9 UNIT/ML-% IJ SOLN
INTRAMUSCULAR | Status: AC
  Filled 2011-08-22: qty 2000

## 2011-08-22 NOTE — Progress Notes (Signed)
LAURA INGOLD,NP NOTIFIED OF CBG 303 AND CLIENT C/O HEADACHE 5/10 AND ORDER NOTED; PER LAURA INGOLD,NP WILL ADVISE CLIENT TO TAKE 10MG  GLIPIZIDE A DAY WHILE OFF METFORMIN AND WATCH DIET WHILE OFF METFORMIN.

## 2011-08-22 NOTE — H&P (Addendum)
History and Physical Interval Note:  NAME:  [name]   MRN: [id number] DOB:  September 24, 1932   ADMIT DATE: 08/22/2011   08/22/2011 9:01 AM  [name] is a 78 y.o. male with a long-standing history of CAD with an initial CABG in 1995 (SVG-PDA, SVG-RCA, SVG-LCx, LIMA-LAD) with re-do single Vessel CABG in 2001 with L Radial-LAD. Loss of LIMA noted prior to 2001.  Cath in 2006 - grafts patent.  He had been doing fairly well until ~Dec 2012, since then he had noted chest discomfort along with L arm discomfort.  He has had colonoscopy & endoscopy as well as a Myoview in 04/2011 that was read as low risk with EF of 65%.  Despite all of these "negative evaluations" he continues to have chest pain that he feels is similar to he pre-CABG Angina.   As he has been on a fairly stable cardiac regimen, and the nature of the pain is relatively atypical with the forearm symptom that may or may not be associated with his discomfort - but similar to his prior angina, Dr. Rex Kras, who saw him on 08/16/2011 felt that we needed a definitive answer as to he coronary  / graft anatomy in order to guide further therapy.     Past Medical History  Diagnosis Date  . Hypertension   . Diabetes mellitus   . CAD (coronary artery disease) of artery bypass graft 1995    1995 - CABG x3; redo in 2001 Free Radial to LAD  . Hyperlipidemia LDL goal < 70    No past surgical history on file.  ALLERGIES: Allergies  Allergen Reactions  . Iohexol Other (See Comments)    Intractable shaking.    HOME MEDICATIONS: Prescriptions prior to admission  Medication Sig Dispense Refill  . Ascorbic Acid (VITAMIN C) 1000 MG tablet Take 1,000 mg by mouth daily.      [name] aspirin EC 81 MG tablet Take 81 mg by mouth daily.      . clopidogrel (PLAVIX) 75 MG tablet Take 75 mg by mouth daily.      [name] doxazosin (CARDURA) 4 MG tablet Take 4 mg by mouth at bedtime.      . gabapentin (NEURONTIN) 300 MG capsule Take 300 mg by mouth 3 (three) times  daily.      [name] glipiZIDE (GLUCOTROL) 5 MG tablet Take 5 mg by mouth 2 (two) times daily before a meal.      . latanoprost (XALATAN) 0.005 % ophthalmic solution Place 1 drop into the right eye at bedtime.      [name] lisinopril (PRINIVIL,ZESTRIL) 20 MG tablet Take 20 mg by mouth daily.      . metFORMIN (GLUCOPHAGE) 1000 MG tablet Take 1,000 mg by mouth 2 (two) times daily with a meal.      . Omega-3 Fatty Acids (FISH OIL) 300 MG CAPS Take 300 mg by mouth daily.      [name] omeprazole (PRILOSEC) 20 MG capsule Take 20 mg by mouth daily.      . simvastatin (ZOCOR) 80 MG tablet Take 80 mg by mouth at bedtime.      . timolol (TIMOPTIC) 0.5 % ophthalmic solution Place 1 drop into the right eye daily.      . Cholecalciferol (VITAMIN D-3) 1000 UNITS CAPS Take 1,000 Units by mouth daily.      [name] loratadine (CLARITIN) 10 MG tablet Take 10 mg by mouth daily as needed. Forb  Seasonal allergies  PHYSICAL EXAM:Blood pressure 146/58, pulse 60, temperature 97 F (36.1 C), temperature source Oral, resp. rate 18, height 5\' 7"  (1.702 m), weight 63.504 kg (140 lb), SpO2 99.00%. General appearance: alert, cooperative and appears stated age Neck: no adenopathy, no JVD, supple, symmetrical, trachea midline, thyroid not enlarged, symmetric, no tenderness/mass/nodules and bilateral carotid bruits Lungs: clear to auscultation bilaterally, normal percussion bilaterally and non-labored Heart: regular rate and rhythm, S1, S2 normal, no murmur, click, rub or gallop Abdomen: soft, non-tender; bowel sounds normal; no masses,  no organomegaly Extremities: extremities normal, atraumatic, no cyanosis or edema and bilateral femoral bruits Pulses: 2+ and symmetric Neurologic: Grossly normal  IMPRESSION & PLAN The patients' history has been reviewed, patient examined, no change in status from most recent note, stable for surgery. I have reviewed the patients' chart and labs. Questions were answered to the patient's satisfaction.     LORRIN PINSKY has presented today for surgery, with the diagnosis of chest pain. The various methods of treatment have been discussed with the patient and family.   Risks / Complications include, but not limited to: Death, MI, CVA/TIA, VF/VT (with defibrillation), Bradycardia (need for temporary pacer placement), contrast induced nephropathy, bleeding / bruising / hematoma / pseudoaneurysm, vascular or coronary injury (with possible emergent CT or Vascular Surgery), adverse medication reactions, infection.     After consideration of risks, benefits and other options for treatment, the patient has consented to Procedure(s):  LEFT HEART CATHETERIZATION AND CORONARY ANGIOGRAPHY +/- AD [place]   as a surgical intervention.   We will proceed with the planned procedure.  He has a history of contrast dye allergy and has been pre-medicated with solumedrol & benadryl.  [address] [place], [place]  [id number]  [phone]  08/22/2011 9:01 AM

## 2011-08-22 NOTE — Progress Notes (Signed)
Spoke with Dr. Ellyn Hack about patient's elevated BP during orthostatics.  Patient states that he has taken all regular medications today including medication for BP after procedure.  Received order to give 10mg  IV labetolol and reassess BP within an hour, if SBP <160 patient can be discharged. Will recheck BP before continuing with discharge of patient.

## 2011-08-22 NOTE — Progress Notes (Signed)
UP AND WALKED AND TOL WELL AND RIGHT GROIN STABLE, NO BLEEDING OR HEMATOMA

## 2011-08-22 NOTE — Progress Notes (Signed)
DR HARDING NOTIFIED OF CBG AND PER DR HARDING WILL ADVISE CLIENT TO TAKE 10MG  GLIPIZIDE TOMORROW MORNING AND THEN RETURN TO 5MG  TWICE DAILY

## 2011-08-22 NOTE — Progress Notes (Signed)
Pt's BP during orthostatics raised.  Call into Dr. Ellyn Hack

## 2011-08-22 NOTE — CV Procedure (Signed)
[facility] PERCUTANEOUS CORONARY INTERVENTION REPORT  NAME:  POLO TORCHIA   MRN: [id number] DOB:  1932-03-01   ADMIT DATE: 08/22/2011  INTERVENTIONAL CARDIOLOGIST: Leonie Man, M.D., MS PRIMARY CARE PROVIDER: Rory Percy, MD PRIMARY CARDIOLOGIST: Chase Picket  PATIENT:  [name] is a 78 y.o. male who is a long-term patient of Dr. Rex Kras with a complicated CAD history (initial CABG in 1995 with LIMA-LAD, SVGs to [place]), who obviously had PCI work to his LAD following this,and eventually had re-do CABG with free Radial-LAD. Last cath was 2006 and all grafts were patent.  He had been doing fairly well until ~Dec 2012, since then he had noted chest discomfort along with L arm discomfort. He has had colonoscopy & endoscopy as well as a Myoview in 04/2011 that was read as low risk with EF of 65%. Despite all of these "negative evaluations" he continues to have chest pain that he feels is similar to he pre-CABG Angina.  As he has been on a fairly stable cardiac regimen, and the nature of the pain is relatively atypical with the forearm symptom that may or may not be associated with his discomfort - but similar to his prior angina, Dr. Rex Kras, who saw him on 08/16/2011 felt that we needed a definitive answer as to he coronary / graft anatomy in order to guide further therapy.   PRE-OPERATIVE DIAGNOSIS:    Chest pain / pressure  Known CAD - s/p CABG 1995, with re-do CABG in 2001  PROCEDURES PERFORMED:    Left heart Catheterization with Native Coronary angiography  Saphenous Vein Graft Angiography  Free Radial Graft Angiography  Left Ventriculography - RAO (12 ml/sec for 25 ml)  PROCEDURE: Consent:  Risks of procedure as well as the alternatives and risks of each were explained to the (patient/caregiver).  Consent for procedure obtained. Consent for signed by MD and patient with RN witness -- placed on chart.  PROCEDURE: The patient was brought to  the [facility] in the fasting state and prepped and draped in the usual sterile fashion for Right groin access. Sterile technique was used including antiseptics, cap, gloves, gown, hand hygiene, mask and sheet.  Skin prep: Chlorhexidine;  Time Out: Verified patient identification, verified procedure, site/side was marked, verified correct patient position, special equipment/implants available, medications/allergies/relevent history reviewed, required imaging and test results available.  Performed  Access: Right Common Femoral Artery; 5 Fr Sheath,Fluoroscopically guided, Modified Seldinger Technique. Diagnostic:  Catheters advanced, exchanged and removed over standard J wire.  Left Coronary Artery Angiography: 5 Fr JL4  Right Coronary Artery, Saphenous Vein and Free Radial Graft Angiography: 5 Fr LR 4  LV Hemodynamics (LV Gram): 5 Fr Angled Pigtail  Hemodynamics:  Central Aortic / Mean Pressures: 135/53 mmHg; 81 mmHg  Left Ventricular Pressures / EDP: 140/4 mmHg; 7 mmHg  Left Ventriculography:  EF: 60% %  Wall Motion: no obvious regional abnormality  Coronary Anatomy:  Left Main: Heavily calcified with minimal antegrade flow to the Left Circumflex; there appears to be an old stent into the LM-LAD that is 100% occluded. LAD: 100% occluded at the LM; 2 stents visible; 1 LM-LAD and one mid LAD.  The vessel fills via Free Radial-distal LAD with retrograde filling through the 2nd stent that has significant ISR of ~60-70% and eventually fills a small Diagonal branch.    The downstream distal LAD is small in caliber and tapers to a very small caliber vessel  at the apex actually gives off a distal diagonal branch that is larger than the terminal vessel. Left Circumflex: Ostial and proximally diffuse disease up to ~70-80%.  There is retrograde filling of the widely patent SVG-OM2 with native angiography and retrograde filling of OM1 and the proximal  Circumflex via SVG injection.  RCA: Large caliber, dominant vessel with diffuse proximal to mid ~50% (up to 60% focally) disease.  Competitive flow noted PDA from SVG (retrograde fills ~1/3 up the SVG).  RPDA: Moderate to large caliber major vessel that reaches to the apex; mild diffuse luminal irregularities.  RPL Sysytem:The Right Posterior AV Groove Branch is also a moderate to large caliber vessel that gives off several small PLBs with one major RPL1 that reaches far across the posterolateral wall and a smaller RPL2.    Grafts:[name]  Free Radial - LAD: widely patent to distal LAD  SVG-OM2: widely patent, retrograde fills up to OM1  SVG-RPDA: widely patent; retrograde fills almost to the RCA ostium.  ANESTHESIA:   Local Lidocaine 16 ml SEDATION:  1 mg IV Versed, 50 mcg IV fentanyl ; Premedication: Valium PO 5mg   Premedication: Benadrykl 25 mg IV, Famotidine 20mg  IV, Solumedrol 120mg  IV. MEDICATIONS: Omnipaque contrast: 83ml  EBL:   < 10 ml  PATIENT DISPOSITION:    The patient was transferred to the PACU holding area in a hemodynamicaly stable, chest pain free condition.  The patient tolerated the procedure well, and there were no complications.  The patient was stable before, during, and after the procedure.  POST-OPERATIVE DIAGNOSIS:    Severe native LCA disease with an occluded LM-LAD stent  Moderate native RCA disease   Widely patent SVGs to RPDA and OM2 along with Free Radial Graft to distal LAD  No culprit lesion visible to explain angina; however given the extent of diffuse distal LAD disease, small vessel disease cannot be discounted.  PLAN OF CARE:  Post catheterization  Optimize medical therapy of small vessel CAD, continue ASA & Plavix.  Consider adding Nitrate.  BB would be difficult as his HR is in the 50s.  May also consider CoQ10 and Amlodipine.  ROV as scheduled -- likely with me.   Leonie Man, M.D., M.S. [facility] [address] [place], [place]  [address]  [phone]  08/22/2011 9:50 AM

## 2012-09-20 ENCOUNTER — Other Ambulatory Visit: Payer: Self-pay | Admitting: Internal Medicine

## 2012-09-20 MED ORDER — CLOPIDOGREL BISULFATE 75 MG PO TABS: 75.0000 mg | ORAL_TABLET | Freq: Every day | ORAL | Status: DC

## 2012-09-20 NOTE — Telephone Encounter (Signed)
Rx sent to pharmacy electronically for #30 w/ 0 refills. Patient is overdue for an appointment. Last office visit - 08/16/11.

## 2012-09-20 NOTE — Telephone Encounter (Signed)
Please refill Plavix 75 mg 1 daily # 90---[facility] [phone]

## 2012-10-24 ENCOUNTER — Telehealth: Payer: Self-pay | Admitting: Cardiology

## 2012-10-24 MED ORDER — CLOPIDOGREL BISULFATE 75 MG PO TABS: 75.0000 mg | ORAL_TABLET | Freq: Every day | ORAL | Status: DC

## 2012-10-24 NOTE — Telephone Encounter (Signed)
Returned patient's call. Informed it was time for him to come in to office for visit (last OV 08/16/11 with Dr. Rex Kras). Informed patient refills will be sent in and scheduler will be notified to contact him to set up appointment. Patient verbalized understanding and agreed with plan.   Rx was sent to pharmacy electronically.

## 2012-10-24 NOTE — Telephone Encounter (Signed)
Said his pharmacy [facility] has requested twice to get plavix refill  Have not heard from [place]  He needs his med  Please call

## 2012-11-09 ENCOUNTER — Ambulatory Visit (INDEPENDENT_AMBULATORY_CARE_PROVIDER_SITE_OTHER): Payer: Medicare Other | Admitting: Cardiology

## 2012-11-09 ENCOUNTER — Encounter: Payer: Self-pay | Admitting: Cardiology

## 2012-11-09 VITALS — BP 160/78 | HR 59 | Ht 67.0 in | Wt 136.0 lb

## 2012-11-09 DIAGNOSIS — E1159 Type 2 diabetes mellitus with other circulatory complications: Secondary | ICD-10-CM

## 2012-11-09 DIAGNOSIS — E785 Hyperlipidemia, unspecified: Secondary | ICD-10-CM

## 2012-11-09 DIAGNOSIS — I10 Essential (primary) hypertension: Secondary | ICD-10-CM

## 2012-11-09 DIAGNOSIS — I739 Peripheral vascular disease, unspecified: Secondary | ICD-10-CM

## 2012-11-09 DIAGNOSIS — I798 Other disorders of arteries, arterioles and capillaries in diseases classified elsewhere: Secondary | ICD-10-CM

## 2012-11-09 DIAGNOSIS — Z951 Presence of aortocoronary bypass graft: Secondary | ICD-10-CM

## 2012-11-09 DIAGNOSIS — I2510 Atherosclerotic heart disease of native coronary artery without angina pectoris: Secondary | ICD-10-CM

## 2012-11-09 DIAGNOSIS — E1151 Type 2 diabetes mellitus with diabetic peripheral angiopathy without gangrene: Secondary | ICD-10-CM

## 2012-11-09 DIAGNOSIS — E119 Type 2 diabetes mellitus without complications: Secondary | ICD-10-CM

## 2012-11-09 NOTE — Patient Instructions (Signed)
Your physician wants you to follow-up in 6 months Dr Ellyn Hack. You will receive a reminder letter in the mail two months in advance. If you don't receive a letter, please call our office to schedule the follow-up appointment.  CONTINUE CURRENT MEDICATION

## 2012-11-09 NOTE — Progress Notes (Signed)
PATIENT: [name] MRN: [id number]  DOB: 10-Jun-1932   DOV:11/11/2012 PCP: Rory Percy, MD  Clinic Note: Chief Complaint  Patient presents with  . Follow-up    Pt reports chest ache mid sterum that's continuous. Denies SOB or swelling.   HPI: [name] is a 79 y.o. male with a PMH below who presents today for a very delayed followup after his last catheterization that was done in August of last year. I actually performed his catheterization, expected and a followup with either me or Dr. Rex Kras, however with the retirement of Dr. Rex Kras, he obviously fell through the cracks. He has a very complex cardiovascular history as documented below. Again and 95 where he had CABG x3. Unfortunately his LIMA graft became atretic in his native LAD was intervened upon with 2 stents that were subsequently occluded. Followup evaluation revealed distal anastomotic lesion and LIMA to the LAD that was providing the only flow to the LAD at the stents are occluded. After 2 attempts to open up the distal anastomosis of the LIMA were unsuccessful, he went for redo CABG in 2001 with a free left radial artery graft to the LAD. 3 catheterization since then in 2005, 2006 and most recently in August of 2013 all of which showed patent grafts. In addition to his cardiac disease he has peripheral vascular disease involving his left vertebral artery and left subclavian artery, with 2 interventions on vertebral artery and one on the subclavian.  Interval History: He comes in today he 14 months after his CABG, thankfully doing relatively well. He is in following up with his [place] care provider. His diabetes control is less now under better control following difficulties after discontinuation of the metformin, but his diarrhea has significantly improved. His A1c is now down to 6.5.  He still has the continuous aching in his chest that has been ongoing since before his cath. It is not necessarily exacerbated with any activity.  Probably do to discomfort at the site of historical wires. He is pointing right to a lump underneath the skin which is associated with a sternal wire.  He does occasionally note some palpitations. He says now he walks all the time though without any major problems. He denies any anginal chest tightness or pressure with rest or exertion. No PND, orthopnea or edema. He does occasionally get orthostatic when bending over or standing or from going from seated to standing. He has not had significant lightheadedness or dizziness associated with an optical syncope or near-syncope. No TIA or amaurosis fugax symptoms. No claudication. No melena, hematochezia, hematuria or nosebleeds. He continues to be on aspirin plus Plavix for secondary prevention.   Past Medical History  Diagnosis Date  . CAD in native artery 1995    CABG x 3 - LIMA-LAD, SVG-OM2, SVG-rPDA  . CAD (coronary artery disease) of artery bypass graft 2000    PCI x 2 - ostial & prox-mid LAD (BMS) for atretic LIMA-LAD  . S/P CABG x 3 1995   . S/P Redo CABG x 1 2001    L Radial-LAD after 2 failed attempts @ LIMA-LAD PTCA; LAD stents 100% occluded  . CAD (coronary artery disease)     1995 - CABG x3; redo in CABG x 1 2001 Free Radial to LAD; All grafts patent by Cath 08/2011  . Hypertension, essential   . Hyperlipidemia LDL goal <70   . DM (diabetes mellitus) type II controlled peripheral vascular disorder 2002    Left subclavian and left  vertebral artery stenoses  . Asymptomatic stenosis of left vertebral artery 2002, 2005    Status post PTA/stent with redo  . Stenosis of left subclavian artery 2005    Status post PTA/stent  . Glaucoma      Prior Cardiac Evaluation and Past Surgical History: Past Surgical History  Procedure Laterality Date  . Coronary artery bypass graft  1995     LIMA-LAD, SVG-OM2, SVG-rPDA  . Coronary stent placement  1995-2000    2 BMS stents to osital-proximal & proximal-mid LAD; because of atretic LIMA-LDA  .  Coronary angioplasty  April and May 2001    After Both LAD stents occluded - PTCA of anastomatic LIMA-LAD lesion -- Unsuccessful.  . Coronary artery bypass graft  June 2001    Dr. Lucianne Lei Trigt: Redo LAD grafting with free Left Radial-distal LAD  . Shoulder open rotator cuff repair  October 2004    Dr. Noemi Chapel  . Sp extracran vert or thor carotid stent Left October 2002; November 2005    Left Vertebral stent placed in October 2002 (Dr. Patrecia Pour); redo PCI in 2005  . Subclavian artery stent Left November 2005    Dr. Gwenlyn Found  . Cardiac catheterization  November 2006; August 13    Known occluded LIMA-LAD and ostial LAD. Moderate to severe proximal circumflex and RCA disease. Widely patent freeLRAD-dLAD, as well as SVG-RPDA (backfilling RPL), SVG-OM 2 (backfilling OM1)   Allergies  Allergen Reactions  . Iohexol Other (See Comments)    Intractable shaking.  . Contrast Media [Iodinated Diagnostic Agents]     Shivering & shaking. Pt reports takes antihistamine prior to procedures.  . Metformin And Related Diarrhea    Subsequently discontinued    Current Outpatient Prescriptions  Medication Sig Dispense Refill  . Ascorbic Acid (VITAMIN C) 1000 MG tablet Take 1,000 mg by mouth daily.      [name] aspirin EC 81 MG tablet Take 81 mg by mouth daily.      . Cholecalciferol (VITAMIN D-3) 1000 UNITS CAPS Take 1,000 Units by mouth daily.      . clopidogrel (PLAVIX) 75 MG tablet Take 1 tablet (75 mg total) by mouth daily.  30 tablet  1  . doxazosin (CARDURA) 4 MG tablet Take 4 mg by mouth at bedtime.      . gabapentin (NEURONTIN) 300 MG capsule Take 300 mg by mouth 3 (three) times daily.      [name] glipiZIDE (GLUCOTROL XL) 10 MG 24 hr tablet Take 10 mg by mouth 2 (two) times daily.      [name] latanoprost (XALATAN) 0.005 % ophthalmic solution Place 1 drop into the right eye at bedtime.      [name] lisinopril (PRINIVIL,ZESTRIL) 20 MG tablet Take 20 mg by mouth daily.      [name] loratadine (CLARITIN) 10 MG tablet Take 10 mg by  mouth daily as needed. Forb  Seasonal allergies      . Omega-3 Fatty Acids (FISH OIL) 300 MG CAPS Take 300 mg by mouth daily.      [name] omeprazole (PRILOSEC) 20 MG capsule Take 20 mg by mouth daily.      . simvastatin (ZOCOR) 80 MG tablet Take 40 mg by mouth at bedtime.       . carvedilol (COREG) 3.125 MG tablet Take 1 tablet (3.125 mg total) by mouth 2 (two) times daily with a meal.  60 tablet  3  . timolol (TIMOPTIC) 0.5 % ophthalmic solution Place 1 drop into the right eye daily.  No current facility-administered medications for this visit.    History   Social History Narrative   He is married with one daughter. He has 2 grandchildren.   He does not smoke. He quit smoking in roughly 1970 after smoking 3 packs per day.   He is routinely at give him. It does not necessarily do routine exercise.    ROS: A comprehensive Review of Systems - Negative except Chest wall pain is noted, seasonal congestion, mild arthritis pains. He does have worsening problems with glaucoma that was recently diagnosed last year. He says the left eye is really bad but is over the right eye is doing better. His Drops using for that. He denies any myalgias or arthralgias from statin. He has one or 2 episodes of nocturia.  PHYSICAL EXAM BP 160/78  Pulse 59  Ht 5\' 7"  (1.702 m)  Wt 136 lb (61.689 kg)  BMI 21.30 kg/m2 General appearance: He is alert and oriented x2. A well-nourished and well-groomed. Answers questions appropriately. NAD. Neck: no adenopathy, no carotid bruit, no JVD and supple, symmetrical, trachea midline Lungs: clear to auscultation bilaterally, normal percussion bilaterally and Nonlabored, good air movement Heart: regular rate and rhythm, S1, S2 normal, no murmur, click, rub or gallop, normal apical impulse and Palpation along the surgical scar at shows a couple bumps down along the lower sternum. These are somewhat tender to palpation. Abdomen: soft, non-tender; bowel sounds normal; no masses,   no organomegaly Extremities: extremities normal, atraumatic, no cyanosis or edema Pulses: 2+ and symmetric Neurologic: Grossly normal HEENT: [place]/AT, EOMI, MMM, anicteric sclera.  [id number] today: Yes Rate: 59 , Rhythm: Sinus bradycardia, otherwise normal ECG.;    LAST RECORED Labs: TC 137, TG 183, HDL 31, LDL 69. -- Followed by PCP  ASSESSMENT / PLAN: CAD (coronary artery disease) No active anginal chest tightness or pressure. His abnormal cardiac function with no evidence of any heart failure symptoms. He remains on aspirin plus Plavix. He is also on an ACE inhibitor but no beta blocker due to bradycardia. He had formerly been on carvedilol but that was  actually never restarted after his cardiac catheterization.  S/P CABG (coronary artery bypass graft): Initial CABGX3 1995 (LIMA-LAD, SVG-OM 2, SVG-RPDA) --> redo CABG x1 FreeLRad-LAD for a occluded LAD with atretic LIMA and failed attempted revascularization. He has had now 2 catheterization showing patent grafts. This correlated with his most recent nuclear stress test. We'll continue to monitor for symptoms, and consider surveillance stress test in 2-3 years.  Hypertension His blood pressure is a little higher today than I like it to be. My only concern is that his describing symptoms of orthostasis. With his vertebral and subclavian artery disease, I am leery of pushing for 2 aggressive blood pressure control. We could consider adding amlodipine if his pressure continues to be elevated.  I will review this is a followup clinic visit., and if the pressure continues to be elevated will accordingly.  Hyperlipidemia LDL goal < 70 His last lipid check was pretty good with the exception of his HDL levels. He is on simvastatin, as monitored by both his primary physician and the [place]. he is also on omega-3 fatty acids, but we may want to consider adding niacin for additional HDL control.  Thankfully he is not noting any myalgias from the high  dose simvastatin.   DM2 (diabetes mellitus, type 2) - with CAD By his report his last A1c was 6.5 which is pretty good control on glipizide alone. This is  being monitored by his PCP as well as the [place].  Asymptomatic stenosis of left vertebral artery No signs of subclavian steal. The stent was widely patent 2006. Monitor for signs of either subperiosteal or left arm claudication.  Peripheral arterial disease - left vertebral and subclavian artery stenosis status post PTA/stent No signs of subclavian steal. The stent was widely patent 2006. Monitor for signs of either subperiosteal or left arm claudication as well as balance issues.  I can't see when the last time he has had Doppler evaluation of the these stents. I plan to order subclavian and carotid Dopplers after his next visit.   No orders of the defined types were placed in this encounter.   Meds ordered this encounter  Medications  . glipiZIDE (GLUCOTROL XL) 10 MG 24 hr tablet    Sig: Take 10 mg by mouth 2 (two) times daily.    Followup: 6 months  DAVID W. Ellyn Hack, M.D., M.S. [facility] [address] [place], [place]  [address]  [phone] Pager # [phone]

## 2012-11-11 ENCOUNTER — Encounter: Payer: Self-pay | Admitting: Cardiology

## 2012-11-11 DIAGNOSIS — E119 Type 2 diabetes mellitus without complications: Secondary | ICD-10-CM | POA: Insufficient documentation

## 2012-11-11 DIAGNOSIS — E1165 Type 2 diabetes mellitus with hyperglycemia: Secondary | ICD-10-CM | POA: Insufficient documentation

## 2012-11-11 DIAGNOSIS — E1151 Type 2 diabetes mellitus with diabetic peripheral angiopathy without gangrene: Secondary | ICD-10-CM | POA: Insufficient documentation

## 2012-11-11 NOTE — Assessment & Plan Note (Signed)
No signs of subclavian steal. The stent was widely patent 2006. Monitor for signs of either subperiosteal or left arm claudication as well as balance issues.  I can't see when the last time he has had Doppler evaluation of the these stents. I plan to order subclavian and carotid Dopplers after his next visit.

## 2012-11-11 NOTE — Assessment & Plan Note (Addendum)
His blood pressure is a little higher today than I like it to be. My only concern is that his describing symptoms of orthostasis. With his vertebral and subclavian artery disease, I am leery of pushing for 2 aggressive blood pressure control. We could consider adding amlodipine if his pressure continues to be elevated.  I will review this is a followup clinic visit., and if the pressure continues to be elevated will accordingly.

## 2012-11-11 NOTE — Assessment & Plan Note (Signed)
He has had now 2 catheterization showing patent grafts. This correlated with his most recent nuclear stress test. We'll continue to monitor for symptoms, and consider surveillance stress test in 2-3 years.

## 2012-11-11 NOTE — Assessment & Plan Note (Addendum)
No active anginal chest tightness or pressure. His abnormal cardiac function with no evidence of any heart failure symptoms. He remains on aspirin plus Plavix. He is also on an ACE inhibitor but no beta blocker due to bradycardia. He had formerly been on carvedilol but that was  actually never restarted after his cardiac catheterization.

## 2012-11-11 NOTE — Assessment & Plan Note (Signed)
His last lipid check was pretty good with the exception of his HDL levels. He is on simvastatin, as monitored by both his primary physician and the [place]. he is also on omega-3 fatty acids, but we may want to consider adding niacin for additional HDL control.  Thankfully he is not noting any myalgias from the high dose simvastatin.

## 2012-11-11 NOTE — Assessment & Plan Note (Signed)
By his report his last A1c was 6.5 which is pretty good control on glipizide alone. This is being monitored by his PCP as well as the [place].

## 2012-11-11 NOTE — Assessment & Plan Note (Signed)
No signs of subclavian steal. The stent was widely patent 2006. Monitor for signs of either subperiosteal or left arm claudication.

## 2013-09-18 ENCOUNTER — Telehealth: Payer: Self-pay | Admitting: *Deleted

## 2013-09-18 DIAGNOSIS — I771 Stricture of artery: Secondary | ICD-10-CM

## 2013-09-18 NOTE — Telephone Encounter (Signed)
Not sure where the disconnect was on getting him scheduled.  Would be nice to have dopplers B4 visit.  Leonie Man, MD

## 2013-09-18 NOTE — Telephone Encounter (Signed)
Came in w/his wife for her INR appt.  Last seen by Dr. Ellyn Hack 11/2012 and was told to come back in 6 months.  He was never contacted for appt (scheduled today for an October appt). Wants to know why he hasn't had a repeat doppler study done.  In review of last note it was stated he would need a carotid and subclavian doppler done after his next visit.  Will send message to Dr. Ellyn Hack and see if he wants the dopplers done prior to his visit or wait until he is seen.

## 2013-09-19 NOTE — Telephone Encounter (Signed)
Order placed for carotid doppler to be done before his appt with Dr. Ellyn Hack 10/24/13.

## 2013-09-19 NOTE — Telephone Encounter (Signed)
Thnx ? ?DH ?

## 2013-09-20 ENCOUNTER — Ambulatory Visit (HOSPITAL_COMMUNITY)
Admission: RE | Admit: 2013-09-20 | Discharge: 2013-09-20 | Disposition: A | Discharge status: Home / Self Care | Payer: Medicare Other | Source: Ambulatory Visit | Attending: Cardiology | Admitting: Cardiology

## 2013-09-20 DIAGNOSIS — I771 Stricture of artery: Secondary | ICD-10-CM

## 2013-09-20 DIAGNOSIS — I6529 Occlusion and stenosis of unspecified carotid artery: Secondary | ICD-10-CM

## 2013-09-20 NOTE — Progress Notes (Signed)
Carotid Duplex Completed. Hodari Chuba, BS, RDMS, RVT  

## 2013-09-24 ENCOUNTER — Telehealth: Payer: Self-pay | Admitting: *Deleted

## 2013-09-24 NOTE — Telephone Encounter (Signed)
Message copied by Raiford Simmonds on Tue Sep 24, 2013  9:09 AM ------      Message from: Leonie Man      Created: Mon Sep 23, 2013  6:13 PM       Only mild stenoses bilaterally.        No need to f/u unless Sx warrant.            Leonie Man, MD       ------

## 2013-09-24 NOTE — Telephone Encounter (Signed)
Spoke to patient. Result given . Verbalized understanding  

## 2013-10-17 ENCOUNTER — Telehealth: Payer: Self-pay | Admitting: Cardiology

## 2013-10-18 NOTE — Telephone Encounter (Signed)
Close encounter 

## 2013-10-24 ENCOUNTER — Ambulatory Visit: Payer: Medicare Other | Admitting: Cardiology

## 2013-11-25 ENCOUNTER — Encounter: Payer: Self-pay | Admitting: Cardiology

## 2013-11-25 ENCOUNTER — Ambulatory Visit (INDEPENDENT_AMBULATORY_CARE_PROVIDER_SITE_OTHER): Payer: Medicare Other | Admitting: Cardiology

## 2013-11-25 VITALS — BP 144/62 | HR 65 | Ht 67.0 in | Wt 140.3 lb

## 2013-11-25 DIAGNOSIS — I10 Essential (primary) hypertension: Secondary | ICD-10-CM

## 2013-11-25 DIAGNOSIS — I739 Peripheral vascular disease, unspecified: Secondary | ICD-10-CM

## 2013-11-25 DIAGNOSIS — E1151 Type 2 diabetes mellitus with diabetic peripheral angiopathy without gangrene: Secondary | ICD-10-CM

## 2013-11-25 DIAGNOSIS — Z951 Presence of aortocoronary bypass graft: Secondary | ICD-10-CM

## 2013-11-25 DIAGNOSIS — I2510 Atherosclerotic heart disease of native coronary artery without angina pectoris: Secondary | ICD-10-CM

## 2013-11-25 DIAGNOSIS — E1159 Type 2 diabetes mellitus with other circulatory complications: Secondary | ICD-10-CM

## 2013-11-25 DIAGNOSIS — I25811 Atherosclerosis of native coronary artery of transplanted heart without angina pectoris: Secondary | ICD-10-CM

## 2013-11-25 DIAGNOSIS — E785 Hyperlipidemia, unspecified: Secondary | ICD-10-CM

## 2013-11-25 NOTE — Patient Instructions (Signed)
Your physician wants you to follow-up in 1 year with Dr. Ellyn Hack. You will receive a reminder letter in the mail 2 months in advance. If you do not receive a letter, please call our office to schedule the follow-up appointment.

## 2013-11-26 ENCOUNTER — Encounter: Payer: Self-pay | Admitting: Cardiology

## 2013-11-26 NOTE — Assessment & Plan Note (Signed)
It would appear that the stents in the left vertebral and subclavian arteries remain relatively patent by recent Doppler report. No sign of subclavian steal.  Continue cardiovascular risk management.  We'll recheck carotid Dopplers at the time of his followup stress test.

## 2013-11-26 NOTE — Assessment & Plan Note (Signed)
Relatively well-controlled blood pressure on ACE inhibitor plus HCTZ. With orthostatic symptoms, I am reluctant to push harder for more aggressive control.

## 2013-11-26 NOTE — Assessment & Plan Note (Signed)
While he does have chest pain, it is clearly neuropathic chest wall pain and not anginal in nature. He had negative Myoview 13. He remains relatively stable on tool and definitive therapy for secondary prevention as well as ACE inhibitor as well as statin.  He is not on a beta blocker due to bradycardia noted at the time of his last catheterization.

## 2013-11-26 NOTE — Assessment & Plan Note (Signed)
With 2 catheterization showing patent grafts, along with a negative Myoview, he would not need another evaluation for at least a couple more years in the absence of angina symptoms.

## 2013-11-26 NOTE — Assessment & Plan Note (Signed)
On oral medications. Monitored by PCP. 

## 2013-11-26 NOTE — Progress Notes (Signed)
PCP: Rory Percy, MD  Clinic Note: Chief Complaint  Patient presents with  . Follow-up    Carotid Dopplers 09/20/13  . Chest Pain    chronic-takes Neurotin  . Coronary Artery Disease    CABG --> then reDO CABG  . PAD    L Subclavian & Vertebral stenosis - s/p PTA-stenting   HPI: [name] is a 80 y.o. male with a PMH below who presents today for 1 yr f/u of CAD-CABG/re-do CABG with LRadial-dLAD for atretic LIMA-LAD graft & failed PCI of LM-LAD & mild carotid stenosis with prior PTA-Stenting of L SubClavian & Vertebral arteries in 2005.  Past Medical History  Diagnosis Date  . CAD in native artery 1995    CABG x 3 - LIMA-LAD, SVG-OM2, SVG-rPDA  . CAD (coronary artery disease) of artery bypass graft 2000    PCI x 2 - ostial & prox-mid LAD (BMS) for atretic LIMA-LAD  . S/P CABG x 3 1995   . S/P Redo CABG x 1 2001    L Radial-LAD after 2 failed attempts @ LIMA-LAD PTCA; LAD stents 100% occluded  . CAD (coronary artery disease)     1995 - CABG x3; redo in CABG x 1 2001 Free Radial to LAD; All grafts patent by Cath 08/2011  . Hypertension, essential   . Hyperlipidemia LDL goal <70   . DM (diabetes mellitus) type II controlled peripheral vascular disorder 2002    Left subclavian and left vertebral artery stenoses  . Asymptomatic stenosis of left vertebral artery 2002, 2005    Status post PTA/stent with redo; normal antegrade flow on dopplers 09/2013  . Stenosis of left subclavian artery 11/2003    Status post PTA/stent --> < 50 % stenosis by Dopplers 09/2013  . Glaucoma    Prior Cardiac Evaluation and Past Surgical History: Reviewed in Epic.  Interval History: [name] presents today with relatively stable cardiac standpoint. He was admitted to the hospital a few months ago patient and renal dysfunction.  He is due for recheck of his labs at the [place] to recheck his renal function and lipids. They have not been sent here yet for review. He describes a chronic discomfort in the  left forearm and hand with weakness in the grip ever since the radial artery graft was harvested.  He also notes chronic chest wall discomfort and tingling in the location of the LIMA graft harvest from the chest wall.   Otherwise cardiac standpoint he remains very stable with no active anginal pains or dyspnea with rest or exertion.  He remains very active, walking all over. No heart failure symptoms of PND, orthopnea or edema.  He does note intermittent palpitations, but none are sustained.  Occasional orthostatic lightheadedness & dizziness,but no weakness, syncope/near syncope, or TIA/amaurosis fugax symptoms.  ROS: A comprehensive was performed. Review of Systems  Constitutional: Negative for weight loss and malaise/fatigue.  HENT: Negative for nosebleeds.   Respiratory: Negative for shortness of breath.   Cardiovascular: Negative for claudication.  Gastrointestinal: Negative for blood in stool and melena.  Genitourinary: Negative for hematuria.  Musculoskeletal: Positive for joint pain.  Neurological: Positive for dizziness, tingling, sensory change and focal weakness. Negative for tremors, speech change, seizures and loss of consciousness.       Chest wall neuropathic pain; L hand grip is weakened since Re-Do CABG Radial graft harvest. Positional dizziness - mild  Endo/Heme/Allergies: Does not bruise/bleed easily.  Psychiatric/Behavioral: Negative for depression and memory loss. The patient is not  nervous/anxious and does not have insomnia.   All other systems reviewed and are negative.   Current Outpatient Prescriptions on File Prior to Visit  Medication Sig Dispense Refill  . Ascorbic Acid (VITAMIN C) 1000 MG tablet Take 1,000 mg by mouth daily.    [name] aspirin EC 81 MG tablet Take 81 mg by mouth daily.    . Cholecalciferol (VITAMIN D-3) 1000 UNITS CAPS Take 1,000 Units by mouth daily.    . clopidogrel (PLAVIX) 75 MG tablet Take 1 tablet (75 mg total) by mouth daily. 30 tablet 1  .  doxazosin (CARDURA) 4 MG tablet Take 4 mg by mouth at bedtime.    [name] glipiZIDE (GLUCOTROL XL) 10 MG 24 hr tablet Take 10 mg by mouth 2 (two) times daily.    [name] latanoprost (XALATAN) 0.005 % ophthalmic solution Place 1 drop into the right eye at bedtime.    [name] lisinopril (PRINIVIL,ZESTRIL) 20 MG tablet Take 20 mg by mouth daily.    [name] loratadine (CLARITIN) 10 MG tablet Take 10 mg by mouth daily as needed. Forb  Seasonal allergies    . Omega-3 Fatty Acids (FISH OIL) 300 MG CAPS Take 300 mg by mouth daily.    [name] omeprazole (PRILOSEC) 20 MG capsule Take 20 mg by mouth daily.    . simvastatin (ZOCOR) 80 MG tablet Take 40 mg by mouth at bedtime.     . timolol (TIMOPTIC) 0.5 % ophthalmic solution Place 1 drop into the right eye daily.     No current facility-administered medications on file prior to visit.   ALLERGIES REVIEWED IN EPIC -- NO change SOCIAL AND FAMILY HISTORY REVIEWED IN EPIC -- NO change  Wt Readings from Last 3 Encounters:  11/25/13 140 lb 4.8 oz (63.64 kg)  11/09/12 136 lb (61.689 kg)  08/22/11 140 lb (63.504 kg)    PHYSICAL EXAM BP 144/62 mmHg  Pulse 65  Ht 5\' 7"  (1.702 m)  Wt 140 lb 4.8 oz (63.64 kg)  BMI 21.97 kg/m2 General appearance: He is alert and oriented x2. A well-nourished and well-groomed. Answers questions appropriately. NAD. HEENT: [place]/AT, EOMI, MMM, anicteric sclera. Neck: no adenopathy, no carotid bruit, no JVD and supple, symmetrical, trachea midline Lungs: clear to auscultation bilaterally, normal percussion bilaterally and Nonlabored, good air movement Heart: regular rate and rhythm, S1, S2 normal, no murmur, click, rub or gallop, normal apical impulse and Palpation along the surgical scar at shows a couple bumps down along the lower sternum. These are somewhat tender to palpation. Abdomen: soft, non-tender; bowel sounds normal; no masses, no organomegaly Extremities: extremities normal, atraumatic, no cyanosis or edema Pulses: 2+ and symmetric Neurologic:  Grossly normal   Adult ECG Report  Rate: 65 ;  Rhythm: normal sinus rhythm;otherwise normal  Narrative Interpretation: No significant change  Recent Labs:  Will be checked by PCP & Scanned into Epic   No labs currently available  ASSESSMENT / PLAN: CAD -> CABG x3 then Re-DO CABG x1 (LRad-dLAD) after atretic LIMA & LAD stent occlusion. While he does have chest pain, it is clearly neuropathic chest wall pain and not anginal in nature. He had negative Myoview 13. He remains relatively stable on tool and definitive therapy for secondary prevention as well as ACE inhibitor as well as statin.  He is not on a beta blocker due to bradycardia noted at the time of his last catheterization.  S/P CABG (coronary artery bypass graft): Initial CABGX3 1995 (LIMA-LAD, SVG-OM 2, SVG-RPDA) --> redo CABG x1 FreeLRad-LAD for  a occluded LAD with atretic LIMA and failed attempted revascularization. With 2 catheterization showing patent grafts, along with a negative Myoview, he would not need another evaluation for at least a couple more years in the absence of angina symptoms.  Peripheral arterial disease - left vertebral and subclavian artery stenosis status post PTA/stent It would appear that the stents in the left vertebral and subclavian arteries remain relatively patent by recent Doppler report. No sign of subclavian steal.  Continue cardiovascular risk management.  We'll recheck carotid Dopplers at the time of his followup stress test.  Essential hypertension Relatively well-controlled blood pressure on ACE inhibitor plus HCTZ. With orthostatic symptoms, I am reluctant to push harder for more aggressive control.  Hyperlipidemia with target LDL less than 70 On high-dose simvastatin plus omega-3 fatty acids.  Being monitored by PCP at the [place].  At last check, his HDL levels were not at goal -- consider adding niacin or fenofibrate ( however it fenofibrate is added, I would change Statin)  DM (diabetes  mellitus) type II controlled peripheral vascular disorder On oral medications. Monitored by PCP.    Orders Placed This Encounter  Procedures  . EKG 12-Lead   Meds ordered this encounter  Medications  . hydrochlorothiazide (HYDRODIURIL) 25 MG tablet    Sig: Take 25 mg by mouth daily.  [name] gabapentin (NEURONTIN) 600 MG tablet    Sig: Take 600 mg by mouth 3 (three) times daily.  . metFORMIN (GLUCOPHAGE-XR) 500 MG 24 hr tablet    Sig: Take 500 mg by mouth at bedtime. Take 2 pills at bedtime    Followup: 1 yr   Leonie Man, M.D., M.S. Interventional Cardiologist   Pager # [phone]

## 2013-11-26 NOTE — Assessment & Plan Note (Signed)
On high-dose simvastatin plus omega-3 fatty acids.  Being monitored by PCP at the [place].  At last check, his HDL levels were not at goal -- consider adding niacin or fenofibrate ( however it fenofibrate is added, I would change Statin)

## 2013-12-12 ENCOUNTER — Encounter (HOSPITAL_COMMUNITY): Payer: Self-pay | Admitting: Cardiology

## 2014-02-12 DIAGNOSIS — E11319 Type 2 diabetes mellitus with unspecified diabetic retinopathy without macular edema: Secondary | ICD-10-CM | POA: Diagnosis not present

## 2014-04-26 DIAGNOSIS — Z951 Presence of aortocoronary bypass graft: Secondary | ICD-10-CM | POA: Diagnosis not present

## 2014-04-26 DIAGNOSIS — I2510 Atherosclerotic heart disease of native coronary artery without angina pectoris: Secondary | ICD-10-CM | POA: Diagnosis not present

## 2014-04-26 DIAGNOSIS — Z7982 Long term (current) use of aspirin: Secondary | ICD-10-CM | POA: Diagnosis not present

## 2014-04-26 DIAGNOSIS — Z7902 Long term (current) use of antithrombotics/antiplatelets: Secondary | ICD-10-CM | POA: Diagnosis not present

## 2014-04-26 DIAGNOSIS — R0789 Other chest pain: Secondary | ICD-10-CM | POA: Diagnosis not present

## 2014-04-26 DIAGNOSIS — I7090 Unspecified atherosclerosis: Secondary | ICD-10-CM | POA: Diagnosis not present

## 2014-04-26 DIAGNOSIS — M545 Low back pain: Secondary | ICD-10-CM | POA: Diagnosis not present

## 2014-04-26 DIAGNOSIS — E1165 Type 2 diabetes mellitus with hyperglycemia: Secondary | ICD-10-CM | POA: Diagnosis not present

## 2014-04-26 DIAGNOSIS — H409 Unspecified glaucoma: Secondary | ICD-10-CM | POA: Diagnosis not present

## 2014-04-26 DIAGNOSIS — Z8042 Family history of malignant neoplasm of prostate: Secondary | ICD-10-CM | POA: Diagnosis not present

## 2014-04-26 DIAGNOSIS — Z87891 Personal history of nicotine dependence: Secondary | ICD-10-CM | POA: Diagnosis not present

## 2014-04-26 DIAGNOSIS — R109 Unspecified abdominal pain: Secondary | ICD-10-CM | POA: Diagnosis not present

## 2014-04-26 DIAGNOSIS — I10 Essential (primary) hypertension: Secondary | ICD-10-CM | POA: Diagnosis not present

## 2014-04-26 DIAGNOSIS — Z79899 Other long term (current) drug therapy: Secondary | ICD-10-CM | POA: Diagnosis not present

## 2014-04-26 DIAGNOSIS — Z833 Family history of diabetes mellitus: Secondary | ICD-10-CM | POA: Diagnosis not present

## 2014-04-26 DIAGNOSIS — R079 Chest pain, unspecified: Secondary | ICD-10-CM | POA: Diagnosis not present

## 2014-04-26 DIAGNOSIS — Z794 Long term (current) use of insulin: Secondary | ICD-10-CM | POA: Diagnosis not present

## 2014-04-26 DIAGNOSIS — R001 Bradycardia, unspecified: Secondary | ICD-10-CM | POA: Diagnosis not present

## 2014-04-27 DIAGNOSIS — I249 Acute ischemic heart disease, unspecified: Secondary | ICD-10-CM | POA: Diagnosis not present

## 2014-06-16 DIAGNOSIS — T63441A Toxic effect of venom of bees, accidental (unintentional), initial encounter: Secondary | ICD-10-CM | POA: Diagnosis not present

## 2014-06-16 DIAGNOSIS — J069 Acute upper respiratory infection, unspecified: Secondary | ICD-10-CM | POA: Diagnosis not present

## 2014-07-12 DIAGNOSIS — Z794 Long term (current) use of insulin: Secondary | ICD-10-CM | POA: Diagnosis not present

## 2014-07-12 DIAGNOSIS — Z7902 Long term (current) use of antithrombotics/antiplatelets: Secondary | ICD-10-CM | POA: Diagnosis not present

## 2014-07-12 DIAGNOSIS — I10 Essential (primary) hypertension: Secondary | ICD-10-CM | POA: Diagnosis not present

## 2014-07-12 DIAGNOSIS — Z87891 Personal history of nicotine dependence: Secondary | ICD-10-CM | POA: Diagnosis not present

## 2014-07-12 DIAGNOSIS — Z79899 Other long term (current) drug therapy: Secondary | ICD-10-CM | POA: Diagnosis not present

## 2014-07-12 DIAGNOSIS — R110 Nausea: Secondary | ICD-10-CM | POA: Diagnosis not present

## 2014-07-12 DIAGNOSIS — K2970 Gastritis, unspecified, without bleeding: Secondary | ICD-10-CM | POA: Diagnosis not present

## 2014-07-12 DIAGNOSIS — Z7982 Long term (current) use of aspirin: Secondary | ICD-10-CM | POA: Diagnosis not present

## 2014-07-12 DIAGNOSIS — K529 Noninfective gastroenteritis and colitis, unspecified: Secondary | ICD-10-CM | POA: Diagnosis not present

## 2014-07-12 DIAGNOSIS — E119 Type 2 diabetes mellitus without complications: Secondary | ICD-10-CM | POA: Diagnosis not present

## 2014-07-12 DIAGNOSIS — K5730 Diverticulosis of large intestine without perforation or abscess without bleeding: Secondary | ICD-10-CM | POA: Diagnosis not present

## 2014-07-14 DIAGNOSIS — A09 Infectious gastroenteritis and colitis, unspecified: Secondary | ICD-10-CM | POA: Diagnosis not present

## 2014-07-15 DIAGNOSIS — R197 Diarrhea, unspecified: Secondary | ICD-10-CM | POA: Diagnosis not present

## 2014-07-28 DIAGNOSIS — R0602 Shortness of breath: Secondary | ICD-10-CM | POA: Diagnosis not present

## 2014-07-28 DIAGNOSIS — I2510 Atherosclerotic heart disease of native coronary artery without angina pectoris: Secondary | ICD-10-CM | POA: Diagnosis not present

## 2014-07-28 DIAGNOSIS — E1165 Type 2 diabetes mellitus with hyperglycemia: Secondary | ICD-10-CM | POA: Diagnosis not present

## 2014-07-28 DIAGNOSIS — I10 Essential (primary) hypertension: Secondary | ICD-10-CM | POA: Diagnosis not present

## 2014-07-28 DIAGNOSIS — K219 Gastro-esophageal reflux disease without esophagitis: Secondary | ICD-10-CM | POA: Diagnosis not present

## 2014-08-02 ENCOUNTER — Emergency Department (HOSPITAL_COMMUNITY): Payer: Medicare Other

## 2014-08-02 ENCOUNTER — Inpatient Hospital Stay (HOSPITAL_COMMUNITY)
Admission: EM | Admit: 2014-08-02 | Discharge: 2014-08-04 | DRG: 287 | Disposition: A | Discharge status: Home / Self Care | Payer: Medicare Other | Attending: Internal Medicine | Admitting: Internal Medicine

## 2014-08-02 ENCOUNTER — Encounter (HOSPITAL_COMMUNITY): Payer: Self-pay | Admitting: *Deleted

## 2014-08-02 DIAGNOSIS — R9439 Abnormal result of other cardiovascular function study: Secondary | ICD-10-CM | POA: Diagnosis not present

## 2014-08-02 DIAGNOSIS — G629 Polyneuropathy, unspecified: Secondary | ICD-10-CM | POA: Diagnosis present

## 2014-08-02 DIAGNOSIS — Z7902 Long term (current) use of antithrombotics/antiplatelets: Secondary | ICD-10-CM | POA: Diagnosis not present

## 2014-08-02 DIAGNOSIS — R197 Diarrhea, unspecified: Secondary | ICD-10-CM | POA: Diagnosis present

## 2014-08-02 DIAGNOSIS — R634 Abnormal weight loss: Secondary | ICD-10-CM | POA: Diagnosis not present

## 2014-08-02 DIAGNOSIS — E878 Other disorders of electrolyte and fluid balance, not elsewhere classified: Secondary | ICD-10-CM | POA: Diagnosis present

## 2014-08-02 DIAGNOSIS — R079 Chest pain, unspecified: Secondary | ICD-10-CM

## 2014-08-02 DIAGNOSIS — R072 Precordial pain: Secondary | ICD-10-CM | POA: Diagnosis not present

## 2014-08-02 DIAGNOSIS — I25810 Atherosclerosis of coronary artery bypass graft(s) without angina pectoris: Secondary | ICD-10-CM | POA: Diagnosis not present

## 2014-08-02 DIAGNOSIS — T82857A Stenosis of cardiac prosthetic devices, implants and grafts, initial encounter: Secondary | ICD-10-CM | POA: Diagnosis not present

## 2014-08-02 DIAGNOSIS — Y831 Surgical operation with implant of artificial internal device as the cause of abnormal reaction of the patient, or of later complication, without mention of misadventure at the time of the procedure: Secondary | ICD-10-CM | POA: Diagnosis present

## 2014-08-02 DIAGNOSIS — I771 Stricture of artery: Secondary | ICD-10-CM | POA: Diagnosis not present

## 2014-08-02 DIAGNOSIS — I208 Other forms of angina pectoris: Secondary | ICD-10-CM | POA: Diagnosis present

## 2014-08-02 DIAGNOSIS — E1165 Type 2 diabetes mellitus with hyperglycemia: Secondary | ICD-10-CM | POA: Diagnosis present

## 2014-08-02 DIAGNOSIS — E782 Mixed hyperlipidemia: Secondary | ICD-10-CM | POA: Diagnosis present

## 2014-08-02 DIAGNOSIS — Z7982 Long term (current) use of aspirin: Secondary | ICD-10-CM | POA: Diagnosis not present

## 2014-08-02 DIAGNOSIS — E860 Dehydration: Secondary | ICD-10-CM | POA: Diagnosis present

## 2014-08-02 DIAGNOSIS — K219 Gastro-esophageal reflux disease without esophagitis: Secondary | ICD-10-CM | POA: Diagnosis present

## 2014-08-02 DIAGNOSIS — H409 Unspecified glaucoma: Secondary | ICD-10-CM | POA: Diagnosis not present

## 2014-08-02 DIAGNOSIS — E1169 Type 2 diabetes mellitus with other specified complication: Secondary | ICD-10-CM | POA: Diagnosis present

## 2014-08-02 DIAGNOSIS — E1159 Type 2 diabetes mellitus with other circulatory complications: Secondary | ICD-10-CM | POA: Diagnosis not present

## 2014-08-02 DIAGNOSIS — R109 Unspecified abdominal pain: Secondary | ICD-10-CM | POA: Diagnosis present

## 2014-08-02 DIAGNOSIS — Z888 Allergy status to other drugs, medicaments and biological substances status: Secondary | ICD-10-CM | POA: Diagnosis not present

## 2014-08-02 DIAGNOSIS — Z79899 Other long term (current) drug therapy: Secondary | ICD-10-CM

## 2014-08-02 DIAGNOSIS — I209 Angina pectoris, unspecified: Secondary | ICD-10-CM | POA: Diagnosis present

## 2014-08-02 DIAGNOSIS — Z87891 Personal history of nicotine dependence: Secondary | ICD-10-CM

## 2014-08-02 DIAGNOSIS — E1151 Type 2 diabetes mellitus with diabetic peripheral angiopathy without gangrene: Secondary | ICD-10-CM | POA: Diagnosis present

## 2014-08-02 DIAGNOSIS — E785 Hyperlipidemia, unspecified: Secondary | ICD-10-CM | POA: Diagnosis present

## 2014-08-02 DIAGNOSIS — E11649 Type 2 diabetes mellitus with hypoglycemia without coma: Secondary | ICD-10-CM | POA: Diagnosis present

## 2014-08-02 DIAGNOSIS — I2089 Other forms of angina pectoris: Secondary | ICD-10-CM | POA: Diagnosis present

## 2014-08-02 DIAGNOSIS — I25119 Atherosclerotic heart disease of native coronary artery with unspecified angina pectoris: Secondary | ICD-10-CM | POA: Diagnosis present

## 2014-08-02 DIAGNOSIS — I2510 Atherosclerotic heart disease of native coronary artery without angina pectoris: Secondary | ICD-10-CM

## 2014-08-02 DIAGNOSIS — I739 Peripheral vascular disease, unspecified: Secondary | ICD-10-CM | POA: Diagnosis present

## 2014-08-02 DIAGNOSIS — Z951 Presence of aortocoronary bypass graft: Secondary | ICD-10-CM

## 2014-08-02 DIAGNOSIS — E872 Acidosis: Secondary | ICD-10-CM | POA: Diagnosis present

## 2014-08-02 DIAGNOSIS — Z91041 Radiographic dye allergy status: Secondary | ICD-10-CM | POA: Diagnosis not present

## 2014-08-02 DIAGNOSIS — E119 Type 2 diabetes mellitus without complications: Secondary | ICD-10-CM | POA: Diagnosis present

## 2014-08-02 DIAGNOSIS — I10 Essential (primary) hypertension: Secondary | ICD-10-CM | POA: Diagnosis present

## 2014-08-02 DIAGNOSIS — I25118 Atherosclerotic heart disease of native coronary artery with other forms of angina pectoris: Secondary | ICD-10-CM | POA: Diagnosis present

## 2014-08-02 LAB — CBC
HCT: 40.5 % (ref 39.0–52.0)
Hemoglobin: 14.3 g/dL (ref 13.0–17.0)
MCH: 29.9 pg (ref 26.0–34.0)
MCHC: 35.3 g/dL (ref 30.0–36.0)
MCV: 84.7 fL (ref 78.0–100.0)
PLATELETS: 214 10*3/uL (ref 150–400)
RBC: 4.78 MIL/uL (ref 4.22–5.81)
RDW: 12.5 % (ref 11.5–15.5)
WBC: 5.4 10*3/uL (ref 4.0–10.5)

## 2014-08-02 LAB — TSH: TSH: 1.466 u[IU]/mL (ref 0.350–4.500)

## 2014-08-02 LAB — HEPATIC FUNCTION PANEL
ALT: 17 U/L (ref 17–63)
AST: 17 U/L (ref 15–41)
Albumin: 3.7 g/dL (ref 3.5–5.0)
Alkaline Phosphatase: 28 U/L — ABNORMAL LOW (ref 38–126)
BILIRUBIN DIRECT: 0.1 mg/dL (ref 0.1–0.5)
BILIRUBIN TOTAL: 0.8 mg/dL (ref 0.3–1.2)
Indirect Bilirubin: 0.7 mg/dL (ref 0.3–0.9)
Total Protein: 6.4 g/dL — ABNORMAL LOW (ref 6.5–8.1)

## 2014-08-02 LAB — I-STAT TROPONIN, ED: TROPONIN I, POC: 0 ng/mL (ref 0.00–0.08)

## 2014-08-02 LAB — BASIC METABOLIC PANEL
ANION GAP: 15 (ref 5–15)
BUN: 18 mg/dL (ref 6–20)
CHLORIDE: 100 mmol/L — AB (ref 101–111)
CO2: 20 mmol/L — ABNORMAL LOW (ref 22–32)
CREATININE: 1.12 mg/dL (ref 0.61–1.24)
Calcium: 9.9 mg/dL (ref 8.9–10.3)
GFR calc non Af Amer: >60 mL/min (ref >60)
Glucose, Bld: 156 mg/dL — ABNORMAL HIGH (ref 65–99)
POTASSIUM: 4.4 mmol/L (ref 3.5–5.1)
SODIUM: 135 mmol/L (ref 135–145)

## 2014-08-02 LAB — TROPONIN I: Troponin I: <0.03 ng/mL (ref <0.031)

## 2014-08-02 LAB — GLUCOSE, CAPILLARY: Glucose-Capillary: 170 mg/dL — ABNORMAL HIGH (ref 65–99)

## 2014-08-02 MED ORDER — ATORVASTATIN CALCIUM 40 MG PO TABS
40.0000 mg | ORAL_TABLET | Freq: Every day | ORAL | Status: DC
  Administered 2014-08-03 – 2014-08-04 (×2): 40 mg via ORAL
  Filled 2014-08-02 (×2): qty 1

## 2014-08-02 MED ORDER — VITAMIN D-3 25 MCG (1000 UT) PO CAPS
1000.0000 [IU] | ORAL_CAPSULE | Freq: Every day | ORAL | Status: DC
  Filled 2014-08-02: qty 1

## 2014-08-02 MED ORDER — ONDANSETRON HCL 4 MG PO TABS: 4.0000 mg | ORAL_TABLET | Freq: Four times a day (QID) | ORAL | Status: DC | PRN

## 2014-08-02 MED ORDER — DOXAZOSIN MESYLATE 2 MG PO TABS
2.0000 mg | ORAL_TABLET | Freq: Every day | ORAL | Status: DC
  Administered 2014-08-02 – 2014-08-03 (×2): 2 mg via ORAL
  Filled 2014-08-02 (×3): qty 1

## 2014-08-02 MED ORDER — ASPIRIN EC 325 MG PO TBEC
325.0000 mg | DELAYED_RELEASE_TABLET | Freq: Every day | ORAL | Status: DC
  Administered 2014-08-04: 325 mg via ORAL
  Filled 2014-08-02 (×2): qty 1

## 2014-08-02 MED ORDER — LISINOPRIL 40 MG PO TABS
40.0000 mg | ORAL_TABLET | Freq: Every day | ORAL | Status: DC
Start: 2014-08-03 — End: 2014-08-04
  Administered 2014-08-03 – 2014-08-04 (×2): 40 mg via ORAL
  Filled 2014-08-02 (×2): qty 1

## 2014-08-02 MED ORDER — CLOPIDOGREL BISULFATE 75 MG PO TABS
75.0000 mg | ORAL_TABLET | Freq: Every day | ORAL | Status: DC
  Administered 2014-08-03 – 2014-08-04 (×2): 75 mg via ORAL
  Filled 2014-08-02 (×3): qty 1

## 2014-08-02 MED ORDER — TIMOLOL MALEATE 0.5 % OP SOLN
1.0000 [drp] | Freq: Three times a day (TID) | OPHTHALMIC | Status: DC
  Administered 2014-08-03 – 2014-08-04 (×5): 1 [drp] via OPHTHALMIC
  Filled 2014-08-02: qty 5

## 2014-08-02 MED ORDER — MORPHINE SULFATE 2 MG/ML IJ SOLN
2.0000 mg | Freq: Once | INTRAMUSCULAR | Status: AC
  Administered 2014-08-02: 2 mg via INTRAVENOUS
  Filled 2014-08-02: qty 1

## 2014-08-02 MED ORDER — LATANOPROST 0.005 % OP SOLN
1.0000 [drp] | Freq: Every day | OPHTHALMIC | Status: DC
  Administered 2014-08-02 – 2014-08-03 (×2): 1 [drp] via OPHTHALMIC
  Filled 2014-08-02: qty 2.5

## 2014-08-02 MED ORDER — HEPARIN SODIUM (PORCINE) 5000 UNIT/ML IJ SOLN
5000.0000 [IU] | Freq: Three times a day (TID) | INTRAMUSCULAR | Status: DC
  Administered 2014-08-02 – 2014-08-03 (×4): 5000 [IU] via SUBCUTANEOUS
  Filled 2014-08-02 (×4): qty 1

## 2014-08-02 MED ORDER — SODIUM CHLORIDE 0.9 % IV SOLN
INTRAVENOUS | Status: DC
  Administered 2014-08-02: 20:00:00 via INTRAVENOUS

## 2014-08-02 MED ORDER — OMEGA-3-ACID ETHYL ESTERS 1 G PO CAPS
1.0000 g | ORAL_CAPSULE | Freq: Every day | ORAL | Status: DC
  Administered 2014-08-03 – 2014-08-04 (×2): 1 g via ORAL
  Filled 2014-08-02 (×2): qty 1

## 2014-08-02 MED ORDER — SODIUM CHLORIDE 0.9 % IJ SOLN: 3.0000 mL | Freq: Two times a day (BID) | INTRAMUSCULAR | Status: DC

## 2014-08-02 MED ORDER — INSULIN ASPART 100 UNIT/ML ~~LOC~~ SOLN
0.0000 [IU] | Freq: Three times a day (TID) | SUBCUTANEOUS | Status: DC
  Administered 2014-08-03: 2 [IU] via SUBCUTANEOUS

## 2014-08-02 MED ORDER — ACETAMINOPHEN 325 MG PO TABS
650.0000 mg | ORAL_TABLET | Freq: Four times a day (QID) | ORAL | Status: DC | PRN
  Administered 2014-08-04: 650 mg via ORAL
  Filled 2014-08-02 (×2): qty 2

## 2014-08-02 MED ORDER — VITAMIN D 1000 UNITS PO TABS
1000.0000 [IU] | ORAL_TABLET | Freq: Every day | ORAL | Status: DC
  Administered 2014-08-02 – 2014-08-04 (×3): 1000 [IU] via ORAL
  Filled 2014-08-02 (×3): qty 1

## 2014-08-02 MED ORDER — ACETAMINOPHEN 650 MG RE SUPP: 650.0000 mg | Freq: Four times a day (QID) | RECTAL | Status: DC | PRN

## 2014-08-02 MED ORDER — INSULIN ASPART 100 UNIT/ML ~~LOC~~ SOLN
3.0000 [IU] | Freq: Three times a day (TID) | SUBCUTANEOUS | Status: DC
  Administered 2014-08-03: 3 [IU] via SUBCUTANEOUS

## 2014-08-02 MED ORDER — NITROGLYCERIN 0.4 MG SL SUBL
0.4000 mg | SUBLINGUAL_TABLET | SUBLINGUAL | Status: AC | PRN
  Administered 2014-08-02 (×3): 0.4 mg via SUBLINGUAL
  Filled 2014-08-02: qty 1

## 2014-08-02 MED ORDER — ACETAMINOPHEN 325 MG PO TABS
650.0000 mg | ORAL_TABLET | Freq: Once | ORAL | Status: AC
Start: 2014-08-02 — End: 2014-08-02
  Administered 2014-08-02: 650 mg via ORAL
  Filled 2014-08-02: qty 2

## 2014-08-02 MED ORDER — SODIUM CHLORIDE 0.9 % IV BOLUS (SEPSIS)
500.0000 mL | Freq: Once | INTRAVENOUS | Status: AC
  Administered 2014-08-02: 500 mL via INTRAVENOUS

## 2014-08-02 MED ORDER — ASPIRIN 81 MG PO CHEW
324.0000 mg | CHEWABLE_TABLET | Freq: Once | ORAL | Status: AC
  Administered 2014-08-02: 324 mg via ORAL
  Filled 2014-08-02: qty 4

## 2014-08-02 MED ORDER — INSULIN GLARGINE 100 UNIT/ML ~~LOC~~ SOLN
10.0000 [IU] | Freq: Every day | SUBCUTANEOUS | Status: DC
Start: 2014-08-02 — End: 2014-08-03
  Administered 2014-08-02: 10 [IU] via SUBCUTANEOUS
  Filled 2014-08-02 (×3): qty 0.1

## 2014-08-02 MED ORDER — ONDANSETRON HCL 4 MG/2ML IJ SOLN: 4.0000 mg | Freq: Four times a day (QID) | INTRAMUSCULAR | Status: DC | PRN

## 2014-08-02 MED ORDER — GABAPENTIN 300 MG PO CAPS
300.0000 mg | ORAL_CAPSULE | Freq: Three times a day (TID) | ORAL | Status: DC
  Administered 2014-08-02 – 2014-08-04 (×6): 300 mg via ORAL
  Filled 2014-08-02 (×6): qty 1

## 2014-08-02 MED ORDER — PANTOPRAZOLE SODIUM 40 MG PO TBEC
40.0000 mg | DELAYED_RELEASE_TABLET | Freq: Every day | ORAL | Status: DC
  Administered 2014-08-03 – 2014-08-04 (×2): 40 mg via ORAL
  Filled 2014-08-02 (×2): qty 1

## 2014-08-02 MED ORDER — BRIMONIDINE TARTRATE 0.15 % OP SOLN
1.0000 [drp] | Freq: Three times a day (TID) | OPHTHALMIC | Status: DC
  Administered 2014-08-03 – 2014-08-04 (×5): 1 [drp] via OPHTHALMIC
  Filled 2014-08-02: qty 5

## 2014-08-02 MED ORDER — ASPIRIN EC 81 MG PO TBEC
81.0000 mg | DELAYED_RELEASE_TABLET | Freq: Every day | ORAL | Status: DC
  Administered 2014-08-03: 81 mg via ORAL
  Filled 2014-08-02: qty 1

## 2014-08-02 NOTE — H&P (Signed)
[facility] History and Physical                                                                                    [name], is a 81 y.o. male  MRN: [id number]   DOB - 1932-09-13  Admit Date - 08/02/2014  Outpatient Primary MD for the patient is Rory Percy, MD  Referring Physician:  Dr. Regenia Skeeter  Chief Complaint:   Chief Complaint  Patient presents with  . Chest Pain     HPI  [name]  is a 81 y.o. male [employer] vet who gets some of his care at the [place]. He has significant coronary artery disease, hypertension, diabetes, and a history of intermittent diarrhea. He presents to the emergency department today for chest pain reminiscent of his previous angina along with 3 weeks of intermittent cramping abdominal pain and diarrhea.  The patient describes his chest pain as being centrally located and not radiating, it has been intermittent for the last 2-3 weeks. It is associated with dizziness, but comes on even at rest. Nitroglycerin appears to make it better.  During the same time frame (3 weeks) the patient has been suffering with increased belching, GERD symptoms and cramping abdominal pain with intermittent diarrhea. These symptoms are much worse when eating, and they occasionally wake him from sleep.  He is having 2-3 loose stools per day. His cramping abdominal pain is relieved when he has a bowel movement. It is most painful in the lower right portion of the stomach.   In the past metformin has caused him to have diarrhea. Once his medication was changed to metformin ER the problem was resolved.   He has been seen by his primary care physician for diarrhea and weakness, and has been evaluated at [facility]. His primary care physician is Dr. Olena Heckle of [facility] in [place]. The patient has had multiple endoscopic procedures in the past. His last endo/colon was done by [facility] gastroenterology in 2013 (approximately) per patient report.  In the emergency department  the patient appears stable but weak. He states he has lost 20 pounds in the past month. Labs indicate mild acidosis and hypochloremia.  Initial troponin is 0, EKG is negative for acute ST changes, chest x-ray is clean.  Review of Systems   In addition to the HPI above, + + he reports multiple tick bites in the last 2-3 months. No Fever-chills, No Headache, No changes with Vision or hearing, No problems swallowing food or Liquids, Cough or Shortness of Breath, No Blood in stool or Urine, No dysuria, No new skin rashes or bruises, No new joints pains-aches,  + + He is complaining of new onset weakness in the last 2-3 weeks. + + He has lost 20 pounds in the past month. A full 10 point Review of Systems was done, except as stated above, all other Review of Systems were negative.  Past Medical History  Past Medical History  Diagnosis Date  . CAD in native artery 1995    CABG x 3 - LIMA-LAD, SVG-OM2, SVG-rPDA  . CAD (coronary artery disease) of artery bypass graft 2000    PCI x 2 -  ostial & prox-mid LAD (BMS) for atretic LIMA-LAD  . S/P CABG x 3 1995   . S/P Redo CABG x 1 2001    L Radial-LAD after 2 failed attempts @ LIMA-LAD PTCA; LAD stents 100% occluded  . CAD (coronary artery disease)     1995 - CABG x3; redo in CABG x 1 2001 Free Radial to LAD; All grafts patent by Cath 08/2011  . Hypertension, essential   . Hyperlipidemia LDL goal <70   . DM (diabetes mellitus) type II controlled peripheral vascular disorder 2002    Left subclavian and left vertebral artery stenoses  . Asymptomatic stenosis of left vertebral artery 2002, 2005    Status post PTA/stent with redo; normal antegrade flow on dopplers 09/2013  . Stenosis of left subclavian artery 11/2003    Status post PTA/stent --> < 50 % stenosis by Dopplers 09/2013  . Glaucoma     Past Surgical History  Procedure Laterality Date  . Coronary artery bypass graft  1995     LIMA-LAD, SVG-OM2, SVG-rPDA  . Coronary stent placement   1995-2000    2 BMS stents to osital-proximal & proximal-mid LAD; because of atretic LIMA-LDA  . Coronary angioplasty  April and May 2001    After Both LAD stents occluded - PTCA of anastomatic LIMA-LAD lesion -- Unsuccessful.  . Coronary artery bypass graft  June 2001    Dr. Lucianne Lei Trigt: Redo LAD grafting with free Left Radial-distal LAD  . Shoulder open rotator cuff repair  October 2004    Dr. Noemi Chapel  . Sp extracran vert or thor carotid stent Left October 2002; November 2005    Left Vertebral stent placed in October 2002 (Dr. Patrecia Pour); redo PCI in 2005  . Subclavian artery stent Left November 2005    Dr. Gwenlyn Found  . Cardiac catheterization  November 2006; August 13    Known occluded LIMA-LAD and ostial LAD. Moderate to severe proximal circumflex and RCA disease. Widely patent freeLRAD-dLAD, as well as SVG-RPDA (backfilling RPL), SVG-OM 2 (backfilling OM1)  . Left heart catheterization with coronary/graft angiogram N/A 08/22/2011    Procedure: LEFT HEART CATHETERIZATION WITH Beatrix Fetters;  Surgeon: Leonie Man, MD;  Location: [facility] CATH LAB;  Service: Cardiovascular;  Laterality: N/A;      Social History History  Substance Use Topics  . Smoking status: Former Smoker -- 3.00 packs/day for 30 years    Types: Cigarettes  . Smokeless tobacco: Not on file  . Alcohol Use: No   lives at home with his wife. Is independent with ADLs.   Family History One brother died with pancreatic cancer. One sister died with lung cancer. He knows of no history of colon cancer or bowel diseases in the family. Multiple relatives have had cardiac disease and congestive heart failure.  Prior to Admission medications   Medication Sig Start Date End Date Taking? Authorizing Provider  acetaminophen (TYLENOL) 500 MG tablet Take 1,000 mg by mouth every 6 (six) hours as needed for mild pain or headache.   Yes Historical Provider, MD  Ascorbic Acid (VITAMIN C) 1000 MG tablet Take 1,000 mg by mouth daily.    Yes Historical Provider, MD  aspirin EC 81 MG tablet Take 81 mg by mouth daily.   Yes Historical Provider, MD  brimonidine (ALPHAGAN P) 0.1 % SOLN Place 1 drop into the right eye 3 (three) times daily.    Yes Historical Provider, MD  Cholecalciferol (VITAMIN D-3) 1000 UNITS CAPS Take 1,000 Units by mouth daily.  Yes Historical Provider, MD  clopidogrel (PLAVIX) 75 MG tablet Take 1 tablet (75 mg total) by mouth daily. 10/24/12  Yes Leonie Man, MD  doxazosin (CARDURA) 4 MG tablet Take 2 mg by mouth at bedtime.    Yes Historical Provider, MD  gabapentin (NEURONTIN) 300 MG capsule Take 300 mg by mouth 3 (three) times daily.    Historical Provider, MD  glipiZIDE (GLUCOTROL XL) 10 MG 24 hr tablet Take 10 mg by mouth 2 (two) times daily.   Yes Historical Provider, MD  hydrochlorothiazide (HYDRODIURIL) 25 MG tablet Take 25 mg by mouth daily.   Yes Historical Provider, MD  insulin aspart protamine- aspart (NOVOLOG MIX 70/30) (70-30) 100 UNIT/ML injection Inject 36 Units into the skin daily as needed (>200 BG).   Yes Historical Provider, MD  latanoprost (XALATAN) 0.005 % ophthalmic solution Place 1 drop into both eyes at bedtime.    Yes Historical Provider, MD  lisinopril (PRINIVIL,ZESTRIL) 40 MG tablet Take 40 mg by mouth daily.   Yes Historical Provider, MD  loratadine (CLARITIN) 10 MG tablet Take 10 mg by mouth daily as needed. Forb  Seasonal allergies    Historical Provider, MD  metFORMIN (GLUCOPHAGE) 500 MG tablet Take 1,000 mg by mouth 2 (two) times daily with a meal.   Yes Historical Provider, MD  Omega-3 Fatty Acids (FISH OIL) 300 MG CAPS Take 300 mg by mouth daily.   Yes Historical Provider, MD  omeprazole (PRILOSEC) 20 MG capsule Take 20 mg by mouth daily.   Yes Historical Provider, MD  simvastatin (ZOCOR) 80 MG tablet Take 40 mg by mouth at bedtime.    Yes Historical Provider, MD  timolol (TIMOPTIC) 0.5 % ophthalmic solution Place 1 drop into the left eye 3 (three) times daily.    Yes  Historical Provider, MD    Allergies  Allergen Reactions  . Iohexol Other (See Comments)    Intractable shaking.  . Contrast Media [Iodinated Diagnostic Agents]     Shivering & shaking. Pt reports takes antihistamine prior to procedures.  . Metformin And Related Diarrhea    Subsequently discontinued    Physical Exam  Vitals  Blood pressure 114/50, pulse 55, temperature 98.2 F (36.8 C), temperature source Oral, resp. rate 15, height 5\' 7"  (1.702 m), SpO2 99 %.   General: Thin, pleasant male lying in bed in NAD, wife and sister-in-law  Psych:  Normal affect and insight, Not Suicidal or Homicidal, Awake Alert, Oriented X 3.  Neuro:   No F.N deficits, ALL C.Nerves Intact, Strength 5/5 all 4 extremities, Sensation intact all 4 extremities.  ENT:  Ears and Eyes appear Normal, Conjunctivae clear, PER. Moist oral mucosa without erythema or exudates.  Neck:  Supple, No lymphadenopathy appreciated  Respiratory:  Symmetrical chest wall movement, Good air movement bilaterally, CTAB.  Cardiac:  RRR, No Murmurs, no LE edema noted, no JVD.    Abdomen: Thin, Positive bowel sounds, Soft, Non tender, Non distended,  No masses appreciated  Skin:  No Cyanosis, Normal Skin Turgor, No Skin Rash or Bruise.  Extremities:  Able to move all 4. 5/5 strength in each,  no effusions.  Data Review  CBC  Recent Labs Lab 08/02/14 1223  WBC 5.4  HGB 14.3  HCT 40.5  PLT 214  MCV 84.7  MCH 29.9  MCHC 35.3  RDW 12.5    Chemistries   Recent Labs Lab 08/02/14 1223  NA 135  K 4.4  CL 100*  CO2 20*  GLUCOSE 156*  BUN  18  CREATININE 1.12  CALCIUM 9.9     Imaging results:   Dg Chest 2 View  08/02/2014   CLINICAL DATA:  81 year old male with chest pain and dizziness.  EXAM: CHEST  2 VIEW  COMPARISON:  07/28/2014 and prior radiographs  FINDINGS: CABG changes and left subclavian stent again noted.  The cardiomediastinal silhouette is unchanged.  There is no evidence of focal airspace  disease, pulmonary edema, suspicious pulmonary nodule/mass, pleural effusion, or pneumothorax.  No acute bony abnormalities are identified.  IMPRESSION: No active cardiopulmonary disease.   Electronically Signed   By: Margarette Canada M.D.   On: 08/02/2014 13:15    My personal review of EKG: NSR, QT is not prolonged.   Assessment & Plan  Principal Problem:   Chest pain at rest Active Problems:   Diarrhea   Weight loss   Abdominal pain   Hyperlipidemia with target LDL less than 70   Essential hypertension   CAD -> CABG x3 then Re-DO CABG x1 (LRad-dLAD) after atretic LIMA & LAD stent occlusion.   DM (diabetes mellitus) type II controlled peripheral vascular disorder   Peripheral arterial disease - left vertebral and subclavian artery stenosis status post PTA/stent   GERD (gastroesophageal reflux disease)  Chest pain Reminiscent of previous angina. Admit to cardiac telemetry. [place] cardiology consultation. Planning for nuclear stress test in the a.m. Cycle troponins. Continue aspirin, Plavix, lisinopril, simvastatin.  Diarrhea Intermittent 3 weeks. History of similar symptoms in the past when on metformin. Will hold metformin. Check GI pathogen panel and C. difficile PCR.  Chronic abdominal pain after eating - Check tissue transglutaminase. Appreciate [facility]'s consultation  Diabetes mellitus Patient complains of recent hypoglycemic episodes (CBGs of 38 and low 60s recently) Hold oral diabetic medications. Consider discontinuation of metformin due to diarrhea and glipizide due to hypoglycemia. Will check hemoglobin A1c. Place on Lantus/NovoLog for now. Please titrate based on CBGs and hemoglobin A1c results.   He has lost 20 pounds and may need significantly less medication.  Mild acidosis and hypochloremia Possibly due to diarrhea and dehydration We'll give gentle IV fluids 12 hours  Hyperlipidemia Continue statin  Peripheral vascular disease Continue  Plavix and aspirin  Peripheral neuropathy Continue gabapentin   Consultants Called:  [facility], Cardiology - [facility]  Family Communication:   Wife at bedside.  Code Status:  Full  Condition:  Guarded.  Potential Disposition:   Time spent in minutes : [place],  PA-C on 08/02/2014 at 5:32 PM Between 7am to 7pm - Pager - [phone] After 7pm go to [email or web address] - password [email or web address] And look for the night coverage person covering me after hours  [facility]

## 2014-08-02 NOTE — ED Notes (Signed)
Admitting doctors at the bedside 

## 2014-08-02 NOTE — ED Provider Notes (Addendum)
CSN: [id number]     Arrival date & time 08/02/14  1206 History   First MD Initiated Contact with Patient 08/02/14 1226     Chief Complaint  Patient presents with  . Chest Pain     (Consider location/radiation/quality/duration/timing/severity/associated sxs/prior Treatment) HPI  81 year old male presents with chest pain that started last night and then diarrhea this morning. Patient states the pain is in the middle of his chest is like someone is sitting on it. Has had this pain before. Sometimes this pain feels like when he has reflux but the one today feels like when he had his heart attacks and required a CABG. Patient has multiple stents. Patient rates pain as a 7/10. Has had 2 episodes of loose watery stools, denies abdominal pain. No nausea or vomiting but has left arm numbness and has broken out in a sweat occasionally with this pain. No shortness of breath. No leg swelling.  Past Medical History  Diagnosis Date  . CAD in native artery 1995    CABG x 3 - LIMA-LAD, SVG-OM2, SVG-rPDA  . CAD (coronary artery disease) of artery bypass graft 2000    PCI x 2 - ostial & prox-mid LAD (BMS) for atretic LIMA-LAD  . S/P CABG x 3 1995   . S/P Redo CABG x 1 2001    L Radial-LAD after 2 failed attempts @ LIMA-LAD PTCA; LAD stents 100% occluded  . CAD (coronary artery disease)     1995 - CABG x3; redo in CABG x 1 2001 Free Radial to LAD; All grafts patent by Cath 08/2011  . Hypertension, essential   . Hyperlipidemia LDL goal <70   . DM (diabetes mellitus) type II controlled peripheral vascular disorder 2002    Left subclavian and left vertebral artery stenoses  . Asymptomatic stenosis of left vertebral artery 2002, 2005    Status post PTA/stent with redo; normal antegrade flow on dopplers 09/2013  . Stenosis of left subclavian artery 11/2003    Status post PTA/stent --> < 50 % stenosis by Dopplers 09/2013  . Glaucoma    Past Surgical History  Procedure Laterality Date  . Coronary artery  bypass graft  1995     LIMA-LAD, SVG-OM2, SVG-rPDA  . Coronary stent placement  1995-2000    2 BMS stents to osital-proximal & proximal-mid LAD; because of atretic LIMA-LDA  . Coronary angioplasty  April and May 2001    After Both LAD stents occluded - PTCA of anastomatic LIMA-LAD lesion -- Unsuccessful.  . Coronary artery bypass graft  June 2001    Dr. Lucianne Lei Trigt: Redo LAD grafting with free Left Radial-distal LAD  . Shoulder open rotator cuff repair  October 2004    Dr. Noemi Chapel  . Sp extracran vert or thor carotid stent Left October 2002; November 2005    Left Vertebral stent placed in October 2002 (Dr. Patrecia Pour); redo PCI in 2005  . Subclavian artery stent Left November 2005    Dr. Gwenlyn Found  . Cardiac catheterization  November 2006; August 13    Known occluded LIMA-LAD and ostial LAD. Moderate to severe proximal circumflex and RCA disease. Widely patent freeLRAD-dLAD, as well as SVG-RPDA (backfilling RPL), SVG-OM 2 (backfilling OM1)  . Left heart catheterization with coronary/graft angiogram N/A 08/22/2011    Procedure: LEFT HEART CATHETERIZATION WITH Beatrix Fetters;  Surgeon: Leonie Man, MD;  Location: [facility] CATH LAB;  Service: Cardiovascular;  Laterality: N/A;   No family history on file. History  Substance Use Topics  . Smoking status:  Former Smoker -- 3.00 packs/day for 30 years    Types: Cigarettes  . Smokeless tobacco: Not on file  . Alcohol Use: No    Review of Systems  Respiratory: Negative for shortness of breath.   Cardiovascular: Positive for chest pain.  Gastrointestinal: Positive for diarrhea. Negative for nausea, vomiting and abdominal pain.  Neurological: Positive for numbness.  All other systems reviewed and are negative.     Allergies  Iohexol; Contrast media; and Metformin and related  Home Medications   Prior to Admission medications   Medication Sig Start Date End Date Taking? Authorizing Provider  Ascorbic Acid (VITAMIN C) 1000 MG tablet  Take 1,000 mg by mouth daily.    Historical Provider, MD  aspirin EC 81 MG tablet Take 81 mg by mouth daily.    Historical Provider, MD  Cholecalciferol (VITAMIN D-3) 1000 UNITS CAPS Take 1,000 Units by mouth daily.    Historical Provider, MD  clopidogrel (PLAVIX) 75 MG tablet Take 1 tablet (75 mg total) by mouth daily. 10/24/12   Leonie Man, MD  doxazosin (CARDURA) 4 MG tablet Take 4 mg by mouth at bedtime.    Historical Provider, MD  gabapentin (NEURONTIN) 600 MG tablet Take 600 mg by mouth 3 (three) times daily.    Historical Provider, MD  glipiZIDE (GLUCOTROL XL) 10 MG 24 hr tablet Take 10 mg by mouth 2 (two) times daily.    Historical Provider, MD  hydrochlorothiazide (HYDRODIURIL) 25 MG tablet Take 25 mg by mouth daily.    Historical Provider, MD  latanoprost (XALATAN) 0.005 % ophthalmic solution Place 1 drop into the right eye at bedtime.    Historical Provider, MD  lisinopril (PRINIVIL,ZESTRIL) 20 MG tablet Take 20 mg by mouth daily.    Historical Provider, MD  loratadine (CLARITIN) 10 MG tablet Take 10 mg by mouth daily as needed. Forb  Seasonal allergies    Historical Provider, MD  metFORMIN (GLUCOPHAGE-XR) 500 MG 24 hr tablet Take 500 mg by mouth at bedtime. Take 2 pills at bedtime    Historical Provider, MD  Omega-3 Fatty Acids (FISH OIL) 300 MG CAPS Take 300 mg by mouth daily.    Historical Provider, MD  omeprazole (PRILOSEC) 20 MG capsule Take 20 mg by mouth daily.    Historical Provider, MD  simvastatin (ZOCOR) 80 MG tablet Take 40 mg by mouth at bedtime.     Historical Provider, MD  timolol (TIMOPTIC) 0.5 % ophthalmic solution Place 1 drop into the right eye daily.    Historical Provider, MD   BP 139/50 mmHg  Pulse 54  Temp(Src) 97.4 F (36.3 C) (Oral)  Resp 19  Ht 5\' 7"  (1.702 m)  SpO2 100% Physical Exam  Constitutional: He is oriented to person, place, and time. He appears well-developed and well-nourished.  HENT:  Head: Normocephalic and atraumatic.  Right Ear:  External ear normal.  Left Ear: External ear normal.  Nose: Nose normal.  Eyes: Right eye exhibits no discharge. Left eye exhibits no discharge.  Neck: Neck supple.  Cardiovascular: Normal rate, regular rhythm, normal heart sounds and intact distal pulses.   Pulmonary/Chest: Effort normal and breath sounds normal.  Abdominal: Soft. There is no tenderness.  Musculoskeletal: He exhibits no edema.  Neurological: He is alert and oriented to person, place, and time.  Skin: Skin is warm and dry. He is not diaphoretic.  Nursing note and vitals reviewed.   ED Course  Procedures (including critical care time) Labs Review Labs Reviewed  BASIC METABOLIC  PANEL - Abnormal; Notable for the following:    Chloride 100 (*)    CO2 20 (*)    Glucose, Bld 156 (*)    All other components within normal limits  CBC  I-STAT TROPOININ, ED    Imaging Review Dg Chest 2 View  08/02/2014   CLINICAL DATA:  81 year old male with chest pain and dizziness.  EXAM: CHEST  2 VIEW  COMPARISON:  07/28/2014 and prior radiographs  FINDINGS: CABG changes and left subclavian stent again noted.  The cardiomediastinal silhouette is unchanged.  There is no evidence of focal airspace disease, pulmonary edema, suspicious pulmonary nodule/mass, pleural effusion, or pneumothorax.  No acute bony abnormalities are identified.  IMPRESSION: No active cardiopulmonary disease.   Electronically Signed   By: Margarette Canada M.D.   On: 08/02/2014 13:15     EKG Interpretation   Date/Time:  Saturday August 02 2014 12:13:18 EDT Ventricular Rate:  73 PR Interval:  156 QRS Duration: 78 QT Interval:  366 QTC Calculation: 403 R Axis:   53 Text Interpretation:  Normal sinus rhythm Nonspecific ST abnormality  Abnormal ECG changes noted since 2012 Confirmed by Daveyon Kitchings  MD, [place]  ([id number]) on 08/02/2014 12:27:05 PM      MDM   Final diagnoses:  Chest pain, unspecified chest pain type    Patient with nonspecific ST segments that are not  quite depressed but looked different than his old EKG. He states this chest pain feels like prior coronary issues although after over 12 hours of pain he has no bump in his troponin. He is well appearing. He has had on and off diarrhea for the past several weeks. No evidence of significant dehydration. Dr. Meda Coffee of cardiology has evaluate the patient was set up for a stress during this admission but would like medicine to admit. Hospitalist consulted, admit to telemetry.    Sherwood Gambler, MD 08/02/14 [place], MD 08/21/14 [phone]

## 2014-08-02 NOTE — H&P (Signed)
Patient ID: [name] MRN: [id number], DOB/AGE: 1932-05-11   Admit date: 08/02/2014   Primary Physician: Rory Percy, MD Primary Cardiologist: Dr Glenetta Hew  Pt. Profile:  Chest pain, s/p CABG x 2  Problem List  Past Medical History  Diagnosis Date  . CAD in native artery 1995    CABG x 3 - LIMA-LAD, SVG-OM2, SVG-rPDA  . CAD (coronary artery disease) of artery bypass graft 2000    PCI x 2 - ostial & prox-mid LAD (BMS) for atretic LIMA-LAD  . S/P CABG x 3 1995   . S/P Redo CABG x 1 2001    L Radial-LAD after 2 failed attempts @ LIMA-LAD PTCA; LAD stents 100% occluded  . CAD (coronary artery disease)     1995 - CABG x3; redo in CABG x 1 2001 Free Radial to LAD; All grafts patent by Cath 08/2011  . Hypertension, essential   . Hyperlipidemia LDL goal <70   . DM (diabetes mellitus) type II controlled peripheral vascular disorder 2002    Left subclavian and left vertebral artery stenoses  . Asymptomatic stenosis of left vertebral artery 2002, 2005    Status post PTA/stent with redo; normal antegrade flow on dopplers 09/2013  . Stenosis of left subclavian artery 11/2003    Status post PTA/stent --> < 50 % stenosis by Dopplers 09/2013  . Glaucoma     Past Surgical History  Procedure Laterality Date  . Coronary artery bypass graft  1995     LIMA-LAD, SVG-OM2, SVG-rPDA  . Coronary stent placement  1995-2000    2 BMS stents to osital-proximal & proximal-mid LAD; because of atretic LIMA-LDA  . Coronary angioplasty  April and May 2001    After Both LAD stents occluded - PTCA of anastomatic LIMA-LAD lesion -- Unsuccessful.  . Coronary artery bypass graft  June 2001    Dr. Lucianne Lei Trigt: Redo LAD grafting with free Left Radial-distal LAD  . Shoulder open rotator cuff repair  October 2004    Dr. Noemi Chapel  . Sp extracran vert or thor carotid stent Left October 2002; November 2005    Left Vertebral stent placed in October 2002 (Dr. Patrecia Pour); redo PCI in 2005  . Subclavian  artery stent Left November 2005    Dr. Gwenlyn Found  . Cardiac catheterization  November 2006; August 13    Known occluded LIMA-LAD and ostial LAD. Moderate to severe proximal circumflex and RCA disease. Widely patent freeLRAD-dLAD, as well as SVG-RPDA (backfilling RPL), SVG-OM 2 (backfilling OM1)  . Left heart catheterization with coronary/graft angiogram N/A 08/22/2011    Procedure: LEFT HEART CATHETERIZATION WITH Beatrix Fetters;  Surgeon: Leonie Man, MD;  Location: [facility] CATH LAB;  Service: Cardiovascular;  Laterality: N/A;     Allergies  Allergies  Allergen Reactions  . Iohexol Other (See Comments)    Intractable shaking.  . Contrast Media [Iodinated Diagnostic Agents]     Shivering & shaking. Pt reports takes antihistamine prior to procedures.  . Metformin And Related Diarrhea    Subsequently discontinued    HPI  Patient is a 81 y.o. male with a PMHx of significant CAD, CABG in 1995 with- LIMA-LAD, SVG-OM2, SVG-rPDA, PCI in 2000 ostial & prox-mid LAD (BMS) for atretic LIMA-LAD and redo CABG in 2001 L Radial-LAD after 2 failed attempts @ LIMA-LAD PTCA; LAD stents 100% occluded. All grafts patent in 2013. The patient started to experience diarrhea approximately a week ago he visited his primary care physician [name] some test and per patient  also stool culture that was all negative. He continued having on and off diarrhea with no blood or melanotic component. He also was experiencing some nausea but no vomiting. This morning he developed significant retrosternal pressure not related to exertion that is persistent and wasn't relieved by nitroglycerin. However this pain feels similar to his prior chest pains prior to the CABG. He has never had an myocardial infarction. He denies any lower extremity edema, orthopnea, paroxysmal nocturnal dyspnea, palpitations or syncope. The patient states that he was compliant to his medicines.   Home Medications  Prior to Admission medications     Medication Sig Start Date End Date Taking? Authorizing Provider  acetaminophen (TYLENOL) 500 MG tablet Take 1,000 mg by mouth every 6 (six) hours as needed for mild pain or headache.   Yes Historical Provider, MD  Ascorbic Acid (VITAMIN C) 1000 MG tablet Take 1,000 mg by mouth daily.   Yes Historical Provider, MD  aspirin EC 81 MG tablet Take 81 mg by mouth daily.   Yes Historical Provider, MD  brimonidine (ALPHAGAN P) 0.1 % SOLN Place 1 drop into the right eye 3 (three) times daily.    Yes Historical Provider, MD  Cholecalciferol (VITAMIN D-3) 1000 UNITS CAPS Take 1,000 Units by mouth daily.   Yes Historical Provider, MD  clopidogrel (PLAVIX) 75 MG tablet Take 1 tablet (75 mg total) by mouth daily. 10/24/12  Yes Leonie Man, MD  doxazosin (CARDURA) 4 MG tablet Take 2 mg by mouth at bedtime.    Yes Historical Provider, MD  gabapentin (NEURONTIN) 300 MG capsule Take 300 mg by mouth 3 (three) times daily.    Historical Provider, MD  glipiZIDE (GLUCOTROL XL) 10 MG 24 hr tablet Take 10 mg by mouth 2 (two) times daily.   Yes Historical Provider, MD  hydrochlorothiazide (HYDRODIURIL) 25 MG tablet Take 25 mg by mouth daily.   Yes Historical Provider, MD  insulin aspart protamine- aspart (NOVOLOG MIX 70/30) (70-30) 100 UNIT/ML injection Inject 36 Units into the skin daily as needed (>200 BG).   Yes Historical Provider, MD  latanoprost (XALATAN) 0.005 % ophthalmic solution Place 1 drop into both eyes at bedtime.    Yes Historical Provider, MD  lisinopril (PRINIVIL,ZESTRIL) 40 MG tablet Take 40 mg by mouth daily.   Yes Historical Provider, MD  loratadine (CLARITIN) 10 MG tablet Take 10 mg by mouth daily as needed. Forb  Seasonal allergies    Historical Provider, MD  metFORMIN (GLUCOPHAGE) 500 MG tablet Take 1,000 mg by mouth 2 (two) times daily with a meal.   Yes Historical Provider, MD  Omega-3 Fatty Acids (FISH OIL) 300 MG CAPS Take 300 mg by mouth daily.   Yes Historical Provider, MD  omeprazole  (PRILOSEC) 20 MG capsule Take 20 mg by mouth daily.   Yes Historical Provider, MD  simvastatin (ZOCOR) 80 MG tablet Take 40 mg by mouth at bedtime.    Yes Historical Provider, MD  timolol (TIMOPTIC) 0.5 % ophthalmic solution Place 1 drop into the left eye 3 (three) times daily.    Yes Historical Provider, MD    Family History  No family history on file.  Social History  History   Social History  . Marital Status: Married    Spouse Name: N/A  . Number of Children: N/A  . Years of Education: N/A   Occupational History  . Not on file.   Social History Main Topics  . Smoking status: Former Smoker -- 3.00 packs/day for 30  years    Types: Cigarettes  . Smokeless tobacco: Not on file  . Alcohol Use: No  . Drug Use: No  . Sexual Activity: Not on file   Other Topics Concern  . Not on file   Social History Narrative   He is married with one daughter. He has 2 grandchildren.   He does not smoke. He quit smoking in roughly 1970 after smoking 3 packs per day.   He is routinely at give him. It does not necessarily do routine exercise.      Review of Systems General:  No chills, fever, night sweats or weight changes.  Cardiovascular:  No chest pain, dyspnea on exertion, edema, orthopnea, palpitations, paroxysmal nocturnal dyspnea. Dermatological: No rash, lesions/masses Respiratory: No cough, dyspnea Urologic: No hematuria, dysuria Abdominal:   No nausea, vomiting, diarrhea, bright red blood per rectum, melena, or hematemesis Neurologic:  No visual changes, wkns, changes in mental status. All other systems reviewed and are otherwise negative except as noted above.  Physical Exam  Blood pressure 115/50, pulse 70, temperature 97.4 F (36.3 C), temperature source Oral, resp. rate 13, height 5\' 7"  (1.702 m), SpO2 99 %.  General: Pleasant, NAD Psych: Normal affect. Neuro: Alert and oriented X 3. Moves all extremities spontaneously. HEENT: Normal  Neck: Supple without bruits or  JVD. Lungs:  Resp regular and unlabored, CTA. Heart: RRR no s3, s4, or murmurs. Abdomen: Soft, non-tender, non-distended, BS + x 4.  Extremities: No clubbing, cyanosis or edema. DP/PT/Radials 2+ and equal bilaterally.  Labs  No results for input(s): CKTOTAL, CKMB, TROPONINI in the last 72 hours. Lab Results  Component Value Date   WBC 5.4 08/02/2014   HGB 14.3 08/02/2014   HCT 40.5 08/02/2014   MCV 84.7 08/02/2014   PLT 214 08/02/2014    Recent Labs Lab 08/02/14 1223  NA 135  K 4.4  CL 100*  CO2 20*  BUN 18  CREATININE 1.12  CALCIUM 9.9  GLUCOSE 156*    Radiology/Studies  Dg Chest 2 View  08/02/2014   CLINICAL DATA:  81 year old male with chest pain and dizziness.  EXAM: CHEST  2 VIEW  COMPARISON:  07/28/2014 and prior radiographs  FINDINGS: CABG changes and left subclavian stent again noted.  The cardiomediastinal silhouette is unchanged.  There is no evidence of focal airspace disease, pulmonary edema, suspicious pulmonary nodule/mass, pleural effusion, or pneumothorax.  No acute bony abnormalities are identified.  IMPRESSION: No active cardiopulmonary disease.   Electronically Signed   By: Margarette Canada M.D.   On: 08/02/2014 13:15   Echocardiogram - none  ECG: SR, nonspecific T wave changes, unchanged from 11/25/2013    ASSESSMENT AND PLAN  81 year old male  1. Chest pain and significant prior medical history for coronary artery disease, please see history of present illness for further details. The first troponin negative, EKG unchanged, chest pain atypical and seems to be related to GI issues however patient states that similar to his prior presentation. We will continue to cycle troponins and EKGs. Will schedule for a Lexiscan nuclear stress test tomorrow. Continue aspirin, Plavix, lisinopril and simvastatin. He is not on beta blocker, his blood pressure is right now on the lower side, if he improves we'll start low-dose beta blocker.  2. Diarrhea, nausea abdominal  pain and chills - we will ask internal medicine to admit for further workup that should include C. difficile evaluation.  3. Hypertension - well controlled, considering he is currently dehydrated I would hold hydrochlorothiazide.  4.  Hyperlipidemia - continue current dose of Zocor.   Signed, Dorothy Spark, MD, [facility] 08/02/2014, 2:36 PM

## 2014-08-02 NOTE — ED Notes (Signed)
Report called to rn on 3w 

## 2014-08-02 NOTE — ED Notes (Signed)
The pt is alert.  Still having pain  Cards just saw the pt

## 2014-08-02 NOTE — ED Notes (Signed)
Pt reports chest pain that started this morning. Pt states that he has some dizziness as well. Pt reports being seen last week for diarrhea and states that this has continued and that he has generalized weakness.

## 2014-08-02 NOTE — ED Notes (Signed)
3w unable to take report

## 2014-08-03 ENCOUNTER — Inpatient Hospital Stay (HOSPITAL_COMMUNITY): Payer: Medicare Other

## 2014-08-03 ENCOUNTER — Encounter (HOSPITAL_COMMUNITY): Payer: Self-pay | Admitting: *Deleted

## 2014-08-03 DIAGNOSIS — K219 Gastro-esophageal reflux disease without esophagitis: Secondary | ICD-10-CM

## 2014-08-03 DIAGNOSIS — R9439 Abnormal result of other cardiovascular function study: Secondary | ICD-10-CM

## 2014-08-03 DIAGNOSIS — R079 Chest pain, unspecified: Secondary | ICD-10-CM

## 2014-08-03 DIAGNOSIS — I209 Angina pectoris, unspecified: Secondary | ICD-10-CM | POA: Diagnosis present

## 2014-08-03 DIAGNOSIS — E1159 Type 2 diabetes mellitus with other circulatory complications: Secondary | ICD-10-CM

## 2014-08-03 LAB — BASIC METABOLIC PANEL
Anion gap: 5 (ref 5–15)
BUN: 14 mg/dL (ref 6–20)
CO2: 26 mmol/L (ref 22–32)
Calcium: 8.8 mg/dL — ABNORMAL LOW (ref 8.9–10.3)
Chloride: 106 mmol/L (ref 101–111)
Creatinine, Ser: 0.97 mg/dL (ref 0.61–1.24)
GFR calc Af Amer: >60 mL/min (ref >60)
GLUCOSE: 86 mg/dL (ref 65–99)
POTASSIUM: 3.6 mmol/L (ref 3.5–5.1)
Sodium: 137 mmol/L (ref 135–145)

## 2014-08-03 LAB — TROPONIN I
Troponin I: <0.03 ng/mL (ref <0.031)
Troponin I: <0.03 ng/mL (ref <0.031)

## 2014-08-03 LAB — GLUCOSE, CAPILLARY
GLUCOSE-CAPILLARY: 175 mg/dL — AB (ref 65–99)
GLUCOSE-CAPILLARY: 58 mg/dL — AB (ref 65–99)
GLUCOSE-CAPILLARY: 289 mg/dL — AB (ref 65–99)
Glucose-Capillary: 82 mg/dL (ref 65–99)

## 2014-08-03 MED ORDER — SODIUM CHLORIDE 0.9 % IJ SOLN: 3.0000 mL | INTRAMUSCULAR | Status: DC | PRN

## 2014-08-03 MED ORDER — ASPIRIN 81 MG PO CHEW
81.0000 mg | CHEWABLE_TABLET | ORAL | Status: AC
  Administered 2014-08-04: 81 mg via ORAL
  Filled 2014-08-03: qty 1

## 2014-08-03 MED ORDER — PREDNISONE 20 MG PO TABS
60.0000 mg | ORAL_TABLET | ORAL | Status: AC
  Administered 2014-08-04: 60 mg via ORAL
  Filled 2014-08-03: qty 3

## 2014-08-03 MED ORDER — FAMOTIDINE IN NACL 20-0.9 MG/50ML-% IV SOLN
20.0000 mg | INTRAVENOUS | Status: AC
  Administered 2014-08-04: 20 mg via INTRAVENOUS
  Filled 2014-08-03: qty 50

## 2014-08-03 MED ORDER — SACCHAROMYCES BOULARDII 250 MG PO CAPS
250.0000 mg | ORAL_CAPSULE | Freq: Two times a day (BID) | ORAL | Status: DC
  Administered 2014-08-03 – 2014-08-04 (×3): 250 mg via ORAL
  Filled 2014-08-03 (×3): qty 1

## 2014-08-03 MED ORDER — SODIUM CHLORIDE 0.9 % IJ SOLN
3.0000 mL | Freq: Two times a day (BID) | INTRAMUSCULAR | Status: DC
  Administered 2014-08-03 – 2014-08-04 (×2): 3 mL via INTRAVENOUS

## 2014-08-03 MED ORDER — DIPHENHYDRAMINE HCL 50 MG/ML IJ SOLN
25.0000 mg | INTRAMUSCULAR | Status: AC
  Administered 2014-08-04: 25 mg via INTRAVENOUS
  Filled 2014-08-03: qty 1

## 2014-08-03 MED ORDER — INSULIN GLARGINE 100 UNIT/ML ~~LOC~~ SOLN
8.0000 [IU] | Freq: Every day | SUBCUTANEOUS | Status: DC
  Administered 2014-08-03: 8 [IU] via SUBCUTANEOUS
  Filled 2014-08-03 (×2): qty 0.08

## 2014-08-03 MED ORDER — TECHNETIUM TC 99M SESTAMIBI GENERIC - CARDIOLITE
10.0000 | Freq: Once | INTRAVENOUS | Status: AC | PRN
  Administered 2014-08-03: 10.43 via INTRAVENOUS

## 2014-08-03 MED ORDER — SODIUM CHLORIDE 0.9 % WEIGHT BASED INFUSION
1.0000 mL/kg/h | INTRAVENOUS | Status: DC
  Administered 2014-08-03: 1 mL/kg/h via INTRAVENOUS

## 2014-08-03 MED ORDER — SODIUM CHLORIDE 0.9 % IV SOLN: 250.0000 mL | INTRAVENOUS | Status: DC | PRN

## 2014-08-03 MED ORDER — TECHNETIUM TC 99M SESTAMIBI - CARDIOLITE
30.0000 | Freq: Once | INTRAVENOUS | Status: AC | PRN
  Administered 2014-08-03: 10:00:00 32.8 via INTRAVENOUS

## 2014-08-03 MED ORDER — REGADENOSON 0.4 MG/5ML IV SOLN
INTRAVENOUS | Status: AC
  Filled 2014-08-03: qty 5

## 2014-08-03 MED ORDER — INSULIN GLARGINE 100 UNIT/ML ~~LOC~~ SOLN: 8.0000 [IU] | Freq: Every day | SUBCUTANEOUS | Status: DC

## 2014-08-03 MED ORDER — GI COCKTAIL ~~LOC~~
30.0000 mL | Freq: Once | ORAL | Status: AC
  Administered 2014-08-03: 30 mL via ORAL
  Filled 2014-08-03: qty 30

## 2014-08-03 MED ORDER — REGADENOSON 0.4 MG/5ML IV SOLN
0.4000 mg | Freq: Once | INTRAVENOUS | Status: AC
  Administered 2014-08-03: 0.4 mg via INTRAVENOUS
  Filled 2014-08-03: qty 5

## 2014-08-03 MED ORDER — PREDNISONE 20 MG PO TABS
60.0000 mg | ORAL_TABLET | ORAL | Status: AC
  Administered 2014-08-03: 60 mg via ORAL
  Filled 2014-08-03: qty 3

## 2014-08-03 NOTE — Progress Notes (Signed)
Patient Name: [name] Date of Encounter: 08/03/2014  Principal Problem:   Chest pain at rest Active Problems:   Hyperlipidemia with target LDL less than 70   Essential hypertension   CAD -> CABG x3 then Re-DO CABG x1 (LRad-dLAD) after atretic LIMA & LAD stent occlusion.   DM (diabetes mellitus) type II controlled peripheral vascular disorder   Peripheral arterial disease - left vertebral and subclavian artery stenosis status post PTA/stent   Diarrhea   GERD (gastroesophageal reflux disease)   Abdominal pain   Weight loss   Angina at rest   Length of Stay: 1  SUBJECTIVE  He is ongoing chest/epigastric pressure.  CURRENT MEDS . aspirin EC  325 mg Oral Daily  . aspirin EC  81 mg Oral Daily  . atorvastatin  40 mg Oral q1800  . brimonidine  1 drop Right Eye TID  . cholecalciferol  1,000 Units Oral Daily  . clopidogrel  75 mg Oral Daily  . doxazosin  2 mg Oral QHS  . gabapentin  300 mg Oral TID  . gi cocktail  30 mL Oral Once  . heparin  5,000 Units Subcutaneous 3 times per day  . insulin aspart  0-9 Units Subcutaneous TID WC  . insulin aspart  3 Units Subcutaneous TID WC  . insulin glargine  10 Units Subcutaneous QHS  . latanoprost  1 drop Both Eyes QHS  . lisinopril  40 mg Oral Daily  . omega-3 acid ethyl esters  1 g Oral Daily  . pantoprazole  40 mg Oral Daily  . regadenoson      . sodium chloride  3 mL Intravenous Q12H  . timolol  1 drop Left Eye TID   OBJECTIVE  Filed Vitals:   08/03/14 0931 08/03/14 0954 08/03/14 0956 08/03/14 1142  BP: 142/92 167/63 162/61 126/45  Pulse: 59 78 70   Temp:      TempSrc:      Resp: 16 18 18    Height:      Weight:      SpO2:        Intake/Output Summary (Last 24 hours) at 08/03/14 1234 Last data filed at 08/03/14 0900  Gross per 24 hour  Intake    770 ml  Output    625 ml  Net    145 ml   Filed Weights   08/02/14 1845  Weight: 122 lb 1.6 oz (55.384 kg)    PHYSICAL EXAM  General: Pleasant, NAD. Neuro:  Alert and oriented X 3. Moves all extremities spontaneously. Psych: Normal affect. HEENT:  Normal  Neck: Supple without bruits or JVD. Lungs:  Resp regular and unlabored, CTA. Heart: RRR no s3, s4, or murmurs. Abdomen: Soft, non-tender, non-distended, BS + x 4.  Extremities: No clubbing, cyanosis or edema. DP/PT/Radials 2+ and equal bilaterally.  Accessory Clinical Findings  CBC  Recent Labs  08/02/14 1223  WBC 5.4  HGB 14.3  HCT 40.5  MCV 84.7  PLT [id number]   Basic Metabolic Panel  Recent Labs  08/02/14 1223 08/03/14 0654  NA 135 137  K 4.4 3.6  CL 100* 106  CO2 20* 26  GLUCOSE 156* 86  BUN 18 14  CREATININE 1.12 0.97  CALCIUM 9.9 8.8*   Liver Function Tests  Recent Labs  08/02/14 1821  AST 17  ALT 17  ALKPHOS 28*  BILITOT 0.8  PROT 6.4*  ALBUMIN 3.7    Recent Labs  08/02/14 2012 08/03/14 0055 08/03/14 0654  TROPONINI <0.03 <  0.03 <0.03    Recent Labs  08/02/14 2012  TSH 1.466    Radiology/Studies  Dg Chest 2 View  08/02/2014   CLINICAL DATA:  81 year old male with chest pain and dizziness.  EXAM: CHEST  2 VIEW  COMPARISON:  07/28/2014 and prior radiographs  FINDINGS: CABG changes and left subclavian stent again noted.  The cardiomediastinal silhouette is unchanged.  There is no evidence of focal airspace disease, pulmonary edema, suspicious pulmonary nodule/mass, pleural effusion, or pneumothorax.  No acute bony abnormalities are identified.  IMPRESSION: No active cardiopulmonary disease.   Electronically Signed   By: Margarette Canada M.D.   On: 08/02/2014 13:15   Nm Myocar Multi W/spect W/wall Motion / Ef  08/03/2014   CLINICAL DATA:  60-year-old male with chest pain  EXAM: MYOCARDIAL IMAGING WITH SPECT (REST AND PHARMACOLOGIC-STRESS)  GATED LEFT VENTRICULAR WALL MOTION STUDY  LEFT VENTRICULAR EJECTION FRACTION  TECHNIQUE: Standard myocardial SPECT imaging was performed after resting intravenous injection of 10 mCi Tc-42m sestamibi. Subsequently,  intravenous infusion of Lexiscan was performed under the supervision of the Cardiology staff. At peak effect of the drug, 30 mCi Tc-11m sestamibi was injected intravenously and standard myocardial SPECT imaging was performed. Quantitative gated imaging was also performed to evaluate left ventricular wall motion, and estimate left ventricular ejection fraction.  COMPARISON:  None.  FINDINGS: Perfusion: There is a small sized defect of moderate severity within the mid and apical segment of the anterior wall which improves from stress to rest.  Wall Motion: Normal left ventricular wall motion. No left ventricular dilation.  Left Ventricular Ejection Fraction: 69 %  End diastolic volume 79 ml  End systolic volume 24 ml  IMPRESSION: 1. Small reversible perfusion defect within the anterior wall anteriorly.  2. Normal left ventricular wall motion.  3. Left ventricular ejection fraction 69%  4. Intermediate-risk stress test findings*.  *2012 Appropriate Use Criteria for Coronary Revascularization Focused Update: J Am Coll Cardiol. N6492421. http://content.[email or web address].aspx?articleid=1201161  These results will be called to the ordering clinician or representative by the Radiologist Assistant, and communication documented in the PACS or zVision Dashboard.   Electronically Signed   By: Suzy Bouchard M.D.   On: 08/03/2014 12:11   TELE: SR    ASSESSMENT AND PLAN  Patient is a 81 y.o. male with a PMHx of significant CAD, CABG in 1995 with- LIMA-LAD, SVG-OM2, SVG-rPDA, PCI in 2000 ostial & prox-mid LAD (BMS) for atretic LIMA-LAD and redo CABG in 2001 L Radial-LAD after 2 failed attempts @ LIMA-LAD PTCA; LAD stents 100% occluded. All grafts patent in 2013.  1. Chest pain and significant prior medical history for coronary artery disease, please see history of present illness for further details. The first troponin negative, EKG unchanged, chest pain atypical , however patient states that similar to  his prior presentation. Troponin negative x 3. Continue aspirin, Plavix, lisinopril and simvastatin. He is not on beta blocker, his blood pressure is right now on the lower side, if he improves we'll start low-dose beta blocker. His stress test is positive and he has ongoing chest pain, we will schedule him for a left cardiac cath tomorrow.   2. Diarrhea - resolved.  3. Hypertension - well controlled, considering he is currently dehydrated I would hold hydrochlorothiazide.  4. Hyperlipidemia - continue current dose of Zocor.   Signed, Dorothy Spark MD, [facility] 08/03/2014

## 2014-08-03 NOTE — Progress Notes (Signed)
PATIENT DETAILS Name: [name] Age: 81 y.o. Sex: male Date of Birth: 1932/05/12 Admit Date: 08/02/2014 Admitting Physician Theodis Blaze, MD SG:[id number], Lennette Bihari, MD  Subjective: Still complains of mild chest pain.  Assessment/Plan: Principal Problem: Chest pain:known hx of CAD-stress test positive-continue ASA/Plavix,statin, Lisinopril-add Beta Blocker if BP tolerates. Cards consulted-[facility] in am.  Active Problems: Diarrhea: seems to have resolved. If recurs-will start C Diff PCR.   DM-2: CBG's stable-continue with Lantus 10 units and Novolog 3 units. Will not resume Metformin-given unexplained diarrhea. Follow-check A1C.  Dyslipidemia:continue Statin  Peripheral vascular disease:continue ASA/Plavix and statin  Peripheral neuropathy:Continue gabapentin  GERD:PPI  Disposition: Remain inpatient  Antimicrobial agents  See below  Anti-infectives    None      DVT Prophylaxis: Prophylactic Heparin   Code Status: Full code   Family Communication Spouse at bedside  Procedures: None  CONSULTS:  cardiology and GI  Time spent 30 minutes-Greater than 50% of this time was spent in counseling, explanation of diagnosis, planning of further management, and coordination of care.  MEDICATIONS: Scheduled Meds: . aspirin EC  325 mg Oral Daily  . aspirin EC  81 mg Oral Daily  . atorvastatin  40 mg Oral q1800  . brimonidine  1 drop Right Eye TID  . cholecalciferol  1,000 Units Oral Daily  . clopidogrel  75 mg Oral Daily  . doxazosin  2 mg Oral QHS  . gabapentin  300 mg Oral TID  . heparin  5,000 Units Subcutaneous 3 times per day  . insulin aspart  0-9 Units Subcutaneous TID WC  . insulin aspart  3 Units Subcutaneous TID WC  . insulin glargine  10 Units Subcutaneous QHS  . latanoprost  1 drop Both Eyes QHS  . lisinopril  40 mg Oral Daily  . omega-3 acid ethyl esters  1 g Oral Daily  . pantoprazole  40 mg Oral Daily  . regadenoson      .  sodium chloride  3 mL Intravenous Q12H  . timolol  1 drop Left Eye TID   Continuous Infusions:  PRN Meds:.acetaminophen **OR** acetaminophen, ondansetron **OR** ondansetron (ZOFRAN) IV    PHYSICAL EXAM: Vital signs in last 24 hours: Filed Vitals:   08/03/14 0931 08/03/14 0954 08/03/14 0956 08/03/14 1142  BP: 142/92 167/63 162/61 126/45  Pulse: 59 78 70   Temp:      TempSrc:      Resp: 16 18 18    Height:      Weight:      SpO2:        Weight change:  Filed Weights   08/02/14 1845  Weight: 55.384 kg (122 lb 1.6 oz)   Body mass index is 19.12 kg/(m^2).   Gen Exam: Awake and alert with clear speech.   Neck: Supple, No JVD.   Chest: B/L Clear.   CVS: S1 S2 Regular, no murmurs.  Abdomen: soft, BS +, non tender, non distended.  Extremities: no edema, lower extremities warm to touch. Neurologic: Non Focal.   Skin: No Rash.   Wounds: N/A.    Intake/Output from previous day:  Intake/Output Summary (Last 24 hours) at 08/03/14 1316 Last data filed at 08/03/14 1230  Gross per 24 hour  Intake    770 ml  Output   1225 ml  Net   -455 ml     LAB RESULTS: CBC  Recent Labs Lab 08/02/14 1223  WBC 5.4  HGB 14.3  HCT 40.5  PLT 214  MCV 84.7  MCH 29.9  MCHC 35.3  RDW 12.5    Chemistries   Recent Labs Lab 08/02/14 1223 08/03/14 0654  NA 135 137  K 4.4 3.6  CL 100* 106  CO2 20* 26  GLUCOSE 156* 86  BUN 18 14  CREATININE 1.12 0.97  CALCIUM 9.9 8.8*    CBG:  Recent Labs Lab 08/02/14 2239 08/03/14 1233  GLUCAP 170* 175*    GFR Estimated Creatinine Clearance: 47.6 mL/min (by C-G formula based on Cr of 0.97).  Coagulation profile No results for input(s): INR, PROTIME in the last 168 hours.  Cardiac Enzymes  Recent Labs Lab 08/02/14 2012 08/03/14 0055 08/03/14 0654  TROPONINI <0.03 <0.03 <0.03    Invalid input(s): POCBNP No results for input(s): DDIMER in the last 72 hours. No results for input(s): HGBA1C in the last 72 hours. No results  for input(s): CHOL, HDL, LDLCALC, TRIG, CHOLHDL, LDLDIRECT in the last 72 hours.  Recent Labs  08/02/14 2012  TSH 1.466   No results for input(s): VITAMINB12, FOLATE, FERRITIN, TIBC, IRON, RETICCTPCT in the last 72 hours. No results for input(s): LIPASE, AMYLASE in the last 72 hours.  Urine Studies No results for input(s): UHGB, CRYS in the last 72 hours.  Invalid input(s): UACOL, UAPR, USPG, UPH, UTP, UGL, UKET, UBIL, UNIT, UROB, ULEU, UEPI, UWBC, URBC, UBAC, CAST, UCOM, BILUA  MICROBIOLOGY: No results found for this or any previous visit (from the past 240 hour(s)).  RADIOLOGY STUDIES/RESULTS: Dg Chest 2 View  08/02/2014   CLINICAL DATA:  81 year old male with chest pain and dizziness.  EXAM: CHEST  2 VIEW  COMPARISON:  07/28/2014 and prior radiographs  FINDINGS: CABG changes and left subclavian stent again noted.  The cardiomediastinal silhouette is unchanged.  There is no evidence of focal airspace disease, pulmonary edema, suspicious pulmonary nodule/mass, pleural effusion, or pneumothorax.  No acute bony abnormalities are identified.  IMPRESSION: No active cardiopulmonary disease.   Electronically Signed   By: Margarette Canada M.D.   On: 08/02/2014 13:15   Nm Myocar Multi W/spect W/wall Motion / Ef  08/03/2014   CLINICAL DATA:  68-year-old male with chest pain  EXAM: MYOCARDIAL IMAGING WITH SPECT (REST AND PHARMACOLOGIC-STRESS)  GATED LEFT VENTRICULAR WALL MOTION STUDY  LEFT VENTRICULAR EJECTION FRACTION  TECHNIQUE: Standard myocardial SPECT imaging was performed after resting intravenous injection of 10 mCi Tc-49m sestamibi. Subsequently, intravenous infusion of Lexiscan was performed under the supervision of the Cardiology staff. At peak effect of the drug, 30 mCi Tc-37m sestamibi was injected intravenously and standard myocardial SPECT imaging was performed. Quantitative gated imaging was also performed to evaluate left ventricular wall motion, and estimate left ventricular ejection  fraction.  COMPARISON:  None.  FINDINGS: Perfusion: There is a small sized defect of moderate severity within the mid and apical segment of the anterior wall which improves from stress to rest.  Wall Motion: Normal left ventricular wall motion. No left ventricular dilation.  Left Ventricular Ejection Fraction: 69 %  End diastolic volume 79 ml  End systolic volume 24 ml  IMPRESSION: 1. Small reversible perfusion defect within the anterior wall anteriorly.  2. Normal left ventricular wall motion.  3. Left ventricular ejection fraction 69%  4. Intermediate-risk stress test findings*.  *2012 Appropriate Use Criteria for Coronary Revascularization Focused Update: J Am Coll Cardiol. N6492421. http://content.[email or web address].aspx?articleid=1201161  These results will be called to the ordering clinician or representative by the Radiologist Assistant, and communication documented  in the PACS or zVision Dashboard.   Electronically Signed   By: Suzy Bouchard M.D.   On: 08/03/2014 12:11    Oren Binet, MD  [facility] Pager:[phone]  If 7PM-7AM, please contact night-coverage [email or web address] Password [email or web address] 08/03/2014, 1:16 PM   LOS: 1 day

## 2014-08-03 NOTE — Progress Notes (Signed)
Patient not in room.  will see later today or tomorrow. Stool studies and tissue transglutaminase pending

## 2014-08-04 ENCOUNTER — Encounter (HOSPITAL_COMMUNITY): Payer: Self-pay | Admitting: Cardiology

## 2014-08-04 ENCOUNTER — Encounter (HOSPITAL_COMMUNITY)
Admission: EM | Disposition: A | Discharge status: Home / Self Care | Payer: Medicare Other | Source: Home / Self Care | Attending: Internal Medicine

## 2014-08-04 DIAGNOSIS — R197 Diarrhea, unspecified: Secondary | ICD-10-CM

## 2014-08-04 HISTORY — PX: CARDIAC CATHETERIZATION: SHX172

## 2014-08-04 LAB — HEMOGLOBIN A1C
HEMOGLOBIN A1C: 6.5 % — AB (ref 4.8–5.6)
Hgb A1c MFr Bld: 6.3 % — ABNORMAL HIGH (ref 4.8–5.6)
MEAN PLASMA GLUCOSE: 140 mg/dL
Mean Plasma Glucose: 134 mg/dL

## 2014-08-04 LAB — BASIC METABOLIC PANEL
ANION GAP: 6 (ref 5–15)
BUN: 17 mg/dL (ref 6–20)
CALCIUM: 8.8 mg/dL — AB (ref 8.9–10.3)
CHLORIDE: 108 mmol/L (ref 101–111)
CO2: 22 mmol/L (ref 22–32)
CREATININE: 0.88 mg/dL (ref 0.61–1.24)
GFR calc Af Amer: >60 mL/min (ref >60)
GLUCOSE: 189 mg/dL — AB (ref 65–99)
Potassium: 4 mmol/L (ref 3.5–5.1)
Sodium: 136 mmol/L (ref 135–145)

## 2014-08-04 LAB — PROTIME-INR
INR: 1.13 (ref 0.00–1.49)
PROTHROMBIN TIME: 14.7 s (ref 11.6–15.2)

## 2014-08-04 LAB — GLUCOSE, CAPILLARY
GLUCOSE-CAPILLARY: 137 mg/dL — AB (ref 65–99)
GLUCOSE-CAPILLARY: 250 mg/dL — AB (ref 65–99)

## 2014-08-04 SURGERY — LEFT HEART CATH AND CORS/GRAFTS ANGIOGRAPHY
Anesthesia: LOCAL

## 2014-08-04 MED ORDER — FENTANYL CITRATE (PF) 100 MCG/2ML IJ SOLN
INTRAMUSCULAR | Status: AC
  Filled 2014-08-04: qty 4

## 2014-08-04 MED ORDER — GLIPIZIDE ER 5 MG PO TB24: 5.0000 mg | ORAL_TABLET | Freq: Every day | ORAL | Status: DC

## 2014-08-04 MED ORDER — AMLODIPINE BESYLATE 5 MG PO TABS
5.0000 mg | ORAL_TABLET | Freq: Every day | ORAL | Status: DC
  Administered 2014-08-04: 5 mg via ORAL
  Filled 2014-08-04: qty 1

## 2014-08-04 MED ORDER — SODIUM CHLORIDE 0.9 % IJ SOLN: 3.0000 mL | Freq: Two times a day (BID) | INTRAMUSCULAR | Status: DC

## 2014-08-04 MED ORDER — MIDAZOLAM HCL 2 MG/2ML IJ SOLN
INTRAMUSCULAR | Status: AC
  Filled 2014-08-04: qty 4

## 2014-08-04 MED ORDER — SODIUM CHLORIDE 0.9 % IV SOLN: 250.0000 mL | INTRAVENOUS | Status: DC | PRN

## 2014-08-04 MED ORDER — SODIUM CHLORIDE 0.9 % WEIGHT BASED INFUSION: 3.0000 mL/kg/h | INTRAVENOUS | Status: AC

## 2014-08-04 MED ORDER — RANOLAZINE ER 500 MG PO TB12
500.0000 mg | ORAL_TABLET | Freq: Two times a day (BID) | ORAL | Status: DC
  Administered 2014-08-04: 500 mg via ORAL
  Filled 2014-08-04: qty 1

## 2014-08-04 MED ORDER — LISINOPRIL 20 MG PO TABS: 20.0000 mg | ORAL_TABLET | Freq: Every day | ORAL | Status: DC

## 2014-08-04 MED ORDER — LISINOPRIL 40 MG PO TABS: 20.0000 mg | ORAL_TABLET | Freq: Every day | ORAL | Status: DC

## 2014-08-04 MED ORDER — HEPARIN (PORCINE) IN NACL 2-0.9 UNIT/ML-% IJ SOLN
INTRAMUSCULAR | Status: AC
  Filled 2014-08-04: qty 1500

## 2014-08-04 MED ORDER — MIDAZOLAM HCL 2 MG/2ML IJ SOLN
INTRAMUSCULAR | Status: DC | PRN
  Administered 2014-08-04: 1 mg via INTRAVENOUS

## 2014-08-04 MED ORDER — RANOLAZINE ER 500 MG PO TB12: 500.0000 mg | ORAL_TABLET | Freq: Two times a day (BID) | ORAL | Status: DC

## 2014-08-04 MED ORDER — MORPHINE SULFATE 2 MG/ML IJ SOLN: 2.0000 mg | INTRAMUSCULAR | Status: DC | PRN

## 2014-08-04 MED ORDER — FENTANYL CITRATE (PF) 100 MCG/2ML IJ SOLN
INTRAMUSCULAR | Status: DC | PRN
  Administered 2014-08-04: 25 ug via INTRAVENOUS

## 2014-08-04 MED ORDER — LIDOCAINE HCL (PF) 1 % IJ SOLN
INTRAMUSCULAR | Status: AC
  Filled 2014-08-04: qty 30

## 2014-08-04 MED ORDER — ATORVASTATIN CALCIUM 40 MG PO TABS: 40.0000 mg | ORAL_TABLET | Freq: Every day | ORAL | Status: DC

## 2014-08-04 MED ORDER — SACCHAROMYCES BOULARDII 250 MG PO CAPS: 250.0000 mg | ORAL_CAPSULE | Freq: Two times a day (BID) | ORAL | Status: DC

## 2014-08-04 MED ORDER — IOHEXOL 350 MG/ML SOLN
INTRAVENOUS | Status: DC | PRN
  Administered 2014-08-04: 70 mL via INTRA_ARTERIAL

## 2014-08-04 MED ORDER — SODIUM CHLORIDE 0.9 % IJ SOLN: 3.0000 mL | INTRAMUSCULAR | Status: DC | PRN

## 2014-08-04 SURGICAL SUPPLY — 8 items
CATH INFINITI 5FR MULTPACK ANG (CATHETERS) ×2 IMPLANT
KIT HEART LEFT (KITS) ×2 IMPLANT
PACK CARDIAC CATHETERIZATION (CUSTOM PROCEDURE TRAY) ×2 IMPLANT
SHEATH PINNACLE 5F 10CM (SHEATH) ×2 IMPLANT
SYR MEDRAD MARK V 150ML (SYRINGE) ×2 IMPLANT
TRANSDUCER W/STOPCOCK (MISCELLANEOUS) ×2 IMPLANT
TUBING CIL FLEX 10 FLL-RA (TUBING) ×2 IMPLANT
WIRE EMERALD 3MM-J .035X150CM (WIRE) ×2 IMPLANT

## 2014-08-04 NOTE — Progress Notes (Signed)
Utilization review completed. Kaityln Kallstrom, RN, BSN. 

## 2014-08-04 NOTE — Discharge Summary (Signed)
PATIENT DETAILS Name: [name] Age: 81 y.o. Sex: male Date of Birth: Aug 08, 1932 MRN: [id number]. Admitting Physician: Theodis Blaze, MD [id number], Lennette Bihari, MD  Admit Date: 08/02/2014 Discharge date: 08/04/2014  Recommendations for Outpatient Follow-up:  1. If Diarrhea reoccurs-refer to gastroenterology  2. Discontinued metformin due to diarrhea, glipizide decreased to 5 mg daily-as A1c only at 6.5. Insulin discontinued. May need continued optimization in the outpatient setting. 3. Tissue transglutaminase pending-please follow  PRIMARY DISCHARGE DIAGNOSIS:  Principal Problem:   Chest pain with moderate risk of acute coronary syndrome Active Problems:   Hyperlipidemia with target LDL less than 70   Essential hypertension   CAD -> CABG x3 then Re-DO CABG x1 (LRad-dLAD) after atretic LIMA & LAD stent occlusion.   DM (diabetes mellitus) type II controlled peripheral vascular disorder   Peripheral arterial disease - left vertebral and subclavian artery stenosis status post PTA/stent   Diarrhea   GERD (gastroesophageal reflux disease)   Abdominal pain   Weight loss   Angina at rest   Abnormal stress test   Ischemic chest pain      PAST MEDICAL HISTORY: Past Medical History  Diagnosis Date  . CAD in native artery 1995    CABG x 3 - LIMA-LAD, SVG-OM2, SVG-rPDA  . CAD (coronary artery disease) of artery bypass graft 2000    PCI x 2 - ostial & prox-mid LAD (BMS) for atretic LIMA-LAD  . S/P CABG x 3 1995   . S/P Redo CABG x 1 2001    L Radial-LAD after 2 failed attempts @ LIMA-LAD PTCA; LAD stents 100% occluded  . CAD (coronary artery disease)     1995 - CABG x3; redo in CABG x 1 2001 Free Radial to LAD; All grafts patent by Cath 08/2011  . Hypertension, essential   . Hyperlipidemia LDL goal <70   . DM (diabetes mellitus) type II controlled peripheral vascular disorder 2002    Left subclavian and left vertebral artery stenoses  . Asymptomatic stenosis of left vertebral  artery 2002, 2005    Status post PTA/stent with redo; normal antegrade flow on dopplers 09/2013  . Stenosis of left subclavian artery 11/2003    Status post PTA/stent --> < 50 % stenosis by Dopplers 09/2013  . Glaucoma     DISCHARGE MEDICATIONS: Current Discharge Medication List    START taking these medications   Details  atorvastatin (LIPITOR) 40 MG tablet Take 1 tablet (40 mg total) by mouth daily at 6 PM. Qty: 30 tablet, Refills: 0    ranolazine (RANEXA) 500 MG 12 hr tablet Take 1 tablet (500 mg total) by mouth 2 (two) times daily. Qty: 60 tablet, Refills: 0    saccharomyces boulardii (FLORASTOR) 250 MG capsule Take 1 capsule (250 mg total) by mouth 2 (two) times daily. Qty: 60 capsule, Refills: 0      CONTINUE these medications which have CHANGED   Details  glipiZIDE (GLUCOTROL XL) 5 MG 24 hr tablet Take 1 tablet (5 mg total) by mouth daily with breakfast. Qty: 30 tablet, Refills: 0    lisinopril (PRINIVIL,ZESTRIL) 40 MG tablet Take 0.5 tablets (20 mg total) by mouth daily. Qty: 30 tablet, Refills: 0      CONTINUE these medications which have NOT CHANGED   Details  acetaminophen (TYLENOL) 500 MG tablet Take 1,000 mg by mouth every 6 (six) hours as needed for mild pain or headache.    Ascorbic Acid (VITAMIN C) 1000 MG tablet Take 1,000 mg by mouth daily.  aspirin EC 81 MG tablet Take 81 mg by mouth daily.    brimonidine (ALPHAGAN P) 0.1 % SOLN Place 1 drop into the right eye 3 (three) times daily.     Cholecalciferol (VITAMIN D-3) 1000 UNITS CAPS Take 1,000 Units by mouth daily.    clopidogrel (PLAVIX) 75 MG tablet Take 1 tablet (75 mg total) by mouth daily. Qty: 30 tablet, Refills: 1    doxazosin (CARDURA) 4 MG tablet Take 2 mg by mouth at bedtime.     gabapentin (NEURONTIN) 300 MG capsule Take 300 mg by mouth 3 (three) times daily.    latanoprost (XALATAN) 0.005 % ophthalmic solution Place 1 drop into both eyes at bedtime.     loratadine (CLARITIN) 10 MG  tablet Take 10 mg by mouth daily as needed. Forb  Seasonal allergies    Omega-3 Fatty Acids (FISH OIL) 300 MG CAPS Take 300 mg by mouth daily.    omeprazole (PRILOSEC) 20 MG capsule Take 20 mg by mouth daily.    timolol (TIMOPTIC) 0.5 % ophthalmic solution Place 1 drop into the left eye 3 (three) times daily.       STOP taking these medications     hydrochlorothiazide (HYDRODIURIL) 25 MG tablet      insulin aspart protamine- aspart (NOVOLOG MIX 70/30) (70-30) 100 UNIT/ML injection      metFORMIN (GLUCOPHAGE) 500 MG tablet      simvastatin (ZOCOR) 80 MG tablet         ALLERGIES:   Allergies  Allergen Reactions  . Iohexol Other (See Comments)    Intractable shaking.  . Contrast Media [Iodinated Diagnostic Agents]     Shivering & shaking. Pt reports takes antihistamine prior to procedures.  . Metformin And Related Diarrhea    Subsequently discontinued    BRIEF HPI:  See H&P, Labs, Consult and Test reports for all details in brief, patient was admitted for evaluation of chest pain and diarrhea.  CONSULTATIONS:   cardiology  PERTINENT RADIOLOGIC STUDIES: Dg Chest 2 View  08/02/2014   CLINICAL DATA:  81 year old male with chest pain and dizziness.  EXAM: CHEST  2 VIEW  COMPARISON:  07/28/2014 and prior radiographs  FINDINGS: CABG changes and left subclavian stent again noted.  The cardiomediastinal silhouette is unchanged.  There is no evidence of focal airspace disease, pulmonary edema, suspicious pulmonary nodule/mass, pleural effusion, or pneumothorax.  No acute bony abnormalities are identified.  IMPRESSION: No active cardiopulmonary disease.   Electronically Signed   By: Margarette Canada M.D.   On: 08/02/2014 13:15   Nm Myocar Multi W/spect W/wall Motion / Ef  08/03/2014   CLINICAL DATA:  30-year-old male with chest pain  EXAM: MYOCARDIAL IMAGING WITH SPECT (REST AND PHARMACOLOGIC-STRESS)  GATED LEFT VENTRICULAR WALL MOTION STUDY  LEFT VENTRICULAR EJECTION FRACTION  TECHNIQUE:  Standard myocardial SPECT imaging was performed after resting intravenous injection of 10 mCi Tc-39m sestamibi. Subsequently, intravenous infusion of Lexiscan was performed under the supervision of the Cardiology staff. At peak effect of the drug, 30 mCi Tc-45m sestamibi was injected intravenously and standard myocardial SPECT imaging was performed. Quantitative gated imaging was also performed to evaluate left ventricular wall motion, and estimate left ventricular ejection fraction.  COMPARISON:  None.  FINDINGS: Perfusion: There is a small sized defect of moderate severity within the mid and apical segment of the anterior wall which improves from stress to rest.  Wall Motion: Normal left ventricular wall motion. No left ventricular dilation.  Left Ventricular Ejection Fraction: 69 %  End diastolic volume 79 ml  End systolic volume 24 ml  IMPRESSION: 1. Small reversible perfusion defect within the anterior wall anteriorly.  2. Normal left ventricular wall motion.  3. Left ventricular ejection fraction 69%  4. Intermediate-risk stress test findings*.  *2012 Appropriate Use Criteria for Coronary Revascularization Focused Update: J Am Coll Cardiol. [id number]. http://content.airportbarriers.com.aspx?articleid=1201161  These results will be called to the ordering clinician or representative by the Radiologist Assistant, and communication documented in the PACS or zVision Dashboard.   Electronically Signed   By: Suzy Bouchard M.D.   On: 08/03/2014 12:11     PERTINENT LAB RESULTS: CBC:  Recent Labs  08/02/14 1223  WBC 5.4  HGB 14.3  HCT 40.5  PLT 214   CMET CMP     Component Value Date/Time   NA 136 08/04/2014 0338   K 4.0 08/04/2014 0338   CL 108 08/04/2014 0338   CO2 22 08/04/2014 0338   GLUCOSE 189* 08/04/2014 0338   BUN 17 08/04/2014 0338   CREATININE 0.88 08/04/2014 0338   CALCIUM 8.8* 08/04/2014 0338   PROT 6.4* 08/02/2014 1821   ALBUMIN 3.7 08/02/2014 1821   AST 17  08/02/2014 1821   ALT 17 08/02/2014 1821   ALKPHOS 28* 08/02/2014 1821   BILITOT 0.8 08/02/2014 1821   GFRNONAA >60 08/04/2014 0338   GFRAA >60 08/04/2014 0338    GFR Estimated Creatinine Clearance: 53 mL/min (by C-G formula based on Cr of 0.88). No results for input(s): LIPASE, AMYLASE in the last 72 hours.  Recent Labs  08/02/14 2012 08/03/14 0055 08/03/14 0654  TROPONINI <0.03 <0.03 <0.03   Invalid input(s): POCBNP No results for input(s): DDIMER in the last 72 hours.  Recent Labs  08/02/14 2012 08/03/14 1934  HGBA1C 6.3* 6.5*   No results for input(s): CHOL, HDL, LDLCALC, TRIG, CHOLHDL, LDLDIRECT in the last 72 hours.  Recent Labs  08/02/14 2012  TSH 1.466   No results for input(s): VITAMINB12, FOLATE, FERRITIN, TIBC, IRON, RETICCTPCT in the last 72 hours. Coags:  Recent Labs  08/04/14 0338  INR 1.13   Microbiology: No results found for this or any previous visit (from the past 240 hour(s)).   BRIEF HOSPITAL COURSE:  Chest pain:known hx of CAD-admitted and underwent further evaluation. Stress test positive-subsequently underwent cardiac catheterization-per cardiology and likely culprit lesion is for retrograde filling of the mid LAD 95% stent restenosis-recommendations were to continue medical management. Unable to tolerate beta blockers in the past due to bradycardia, lisinopril decreased to 20 mg, added amlodipine and Ranexa for maximal antianginal therapy. HCTZ has been discontinued. Simvastatin has been changed to Lipitor. Outpatient cardiology follow-up arranged. Stable for discharge later today  Active Problems: Diarrhea: seems to have resolved on day of admission without any recurrence. Since no diarrhea-unable to provide samples for C. difficile PCR, although GI was consulted, since her diarrhea resolved spontaneously no role for GI consultation at this time. Tissue transglutaminase was obtained on admission-currently pending-please follow.  DM-2:  CBG's stable-but did have episodes of hypoglycemia yesterday. Apparently only takes Insulin at home if CBG's >200. A1C only at 6.5-Will decrease glipizide to 5 mg daily, discontinue metformin because of unexplained diarrhea. Further optimization deferred to the outpatient setting   HTN: controlled, continue Lisinopril-dosed reduced to 20 mg. Amlodipine added. Further optimization deferred to the outpatient setting  Dyslipidemia:continue Statin  Peripheral vascular disease:continue ASA/Plavix and statin  Peripheral neuropathy:Continue gabapentin   TODAY-DAY OF DISCHARGE:  Subjective:   [place] today has no headache,no  chest abdominal pain,no new weakness tingling or numbness, feels much better wants to go home today.   Objective:   Blood pressure 138/32, pulse 50, temperature 97.7 F (36.5 C), temperature source Oral, resp. rate 15, height 5\' 7"  (1.702 m), weight 56.019 kg (123 lb 8 oz), SpO2 100 %.  Intake/Output Summary (Last 24 hours) at 08/04/14 1458 Last data filed at 08/04/14 0130  Gross per 24 hour  Intake    360 ml  Output    325 ml  Net     35 ml   Filed Weights   08/02/14 1845 08/04/14 0659  Weight: 55.384 kg (122 lb 1.6 oz) 56.019 kg (123 lb 8 oz)    Exam Awake Alert, Oriented *3, No new F.N deficits, Normal affect [place].AT,PERRAL Supple Neck,No JVD, No cervical lymphadenopathy appriciated.  Symmetrical Chest wall movement, Good air movement bilaterally, CTAB RRR,No Gallops,Rubs or new Murmurs, No Parasternal Heave +ve B.Sounds, Abd Soft, Non tender, No organomegaly appriciated, No rebound -guarding or rigidity. No Cyanosis, Clubbing or edema, No new Rash or bruise  DISCHARGE CONDITION: Stable  DISPOSITION: Home  DISCHARGE INSTRUCTIONS:    Activity:  As tolerated   Diet recommendation: Diabetic Diet Heart Healthy diet  Discharge Instructions    Call MD for:  persistant dizziness or light-headedness    Complete by:  As directed      Call MD for:   persistant nausea and vomiting    Complete by:  As directed      Call MD for:  severe uncontrolled pain    Complete by:  As directed      Diet - low sodium heart healthy    Complete by:  As directed      Diet Carb Modified    Complete by:  As directed      Increase activity slowly    Complete by:  As directed            Follow-up Information    Follow up with Lyda Jester, PA-C On 09/01/2014.   Specialties:  Cardiology, Radiology   Why:  @4 :00pm   Contact information:   [address] [place] [address] [phone]       Follow up with Rory Percy, MD. Schedule an appointment as soon as possible for a visit in 1 week.   Specialty:  Family Medicine   Contact information:   [place] [address] [phone]       Total Time spent on discharge equals 45 minutes.  SignedOren Binet 08/04/2014 2:58 PM

## 2014-08-04 NOTE — Care Management Note (Signed)
Case Management Note Marvetta Gibbons RN, BSN Unit 2W-Case Manager [phone] Covering 3W  Patient Details  Name: [name] MRN: [id number] Date of Birth: 11-09-1932  Subjective/Objective:      Pt admitted with chest pain, positive stress test, for cath today              Action/Plan: PTA pt lived at home- post cath pt to start on Ranexa 500 mg BID- benefits check completed- Pt copay will be $45-prior auth not required -  Spoke with pt and shared copay info- per pt he gets most of his meds at the [place]- for 90 days- encouraged pt to check with his [place] doctor to see if Ranexa is on the [place] and if they might approve drug- pt to f/u with his [place] PCP- call made to [facility]- who has drug in stock and can fill 30 day supply upon discharge. No further CM needs noted.   Expected Discharge Date:       08/04/14           Expected Discharge Plan:  Home/Self Care  In-House Referral:     Discharge planning Services  CM Consult, Medication Assistance  Post Acute Care Choice:    Choice offered to:     DME Arranged:    DME Agency:     HH Arranged:    HH Agency:     Status of Service:  Completed, signed off  Medicare Important Message Given:    Date Medicare IM Given:    Medicare IM give by:    Date Additional Medicare IM Given:    Additional Medicare Important Message give by:     If discussed at [place] of Stay Meetings, dates discussed:    Additional Comments:  [name], [name], RN 08/04/2014, 4:15 PM

## 2014-08-04 NOTE — Discharge Instructions (Signed)
Radial Site Care °Refer to this sheet in the next few weeks. These instructions provide you with information on caring for yourself after your procedure. Your caregiver may also give you more specific instructions. Your treatment has been planned according to current medical practices, but problems sometimes occur. Call your caregiver if you have any problems or questions after your procedure. °HOME CARE INSTRUCTIONS °· You may shower the day after the procedure. Remove the bandage (dressing) and gently wash the site with plain soap and water. Gently pat the site dry. °· Do not apply powder or lotion to the site. °· Do not submerge the affected site in water for 3 to 5 days. °· Inspect the site at least twice daily. °· Do not flex or bend the affected arm for 24 hours. °· No lifting over 5 pounds (2.3 kg) for 5 days after your procedure. °· Do not drive home if you are discharged the same day of the procedure. Have someone else drive you. °· You may drive 24 hours after the procedure unless otherwise instructed by your caregiver. °· Do not operate machinery or power tools for 24 hours. °· A responsible adult should be with you for the first 24 hours after you arrive home. °What to expect: °· Any bruising will usually fade within 1 to 2 weeks. °· Blood that collects in the tissue (hematoma) may be painful to the touch. It should usually decrease in size and tenderness within 1 to 2 weeks. °SEEK IMMEDIATE MEDICAL CARE IF: °· You have unusual pain at the radial site. °· You have redness, warmth, swelling, or pain at the radial site. °· You have drainage (other than a small amount of blood on the dressing). °· You have chills. °· You have a fever or persistent symptoms for more than 72 hours. °· You have a fever and your symptoms suddenly get worse. °· Your arm becomes pale, cool, tingly, or numb. °· You have heavy bleeding from the site. Hold pressure on the site. °Document Released: 01/22/2010 Document Revised:  03/14/2011 Document Reviewed: 01/22/2010 °ExitCare® Patient Information ©2015 [employer]. This information is not intended to replace advice given to you by your health care provider. Make sure you discuss any questions you have with your health care provider. ° °

## 2014-08-04 NOTE — Progress Notes (Signed)
Cath result noted:  Conclusion    1. Ost RCA to Mid RCA lesion, 70% stenosed. Mid RCA to Dist RCA lesion, 70% stenosed. 2. Ost LAD to Prox LAD stented segment 100% stenosed. Mid LAD to Dist LAD stented segment 95% stenosed (visualized via retrograde filling from Radial Graft. 3. Widely patent SVG-distal RCA with Retrograde flow almost all the way up to the proximal 70% stenosis of the native RCA . Antegrade flow fills the entire posterolateral system with minimal disease. 4. Ost Cx to Prox Cx lesion, 80% stenosed. Widely patent SVG-CxOM. Retrograde flow from the native circumflex in the graft fills the entire graft back to the ostium. 5. Roughly 50% ostial stenosis of the free radial artery graft to the LAD. 6. Normal LV function and EDP  Likely culprit lesion for the patient's chest pain and abnormal stress test is the poor retrograde filling of the mid LAD 95% in-stent restenosis. Or truly this cannot be approached from a percutaneous option.  Recommendation is to continue medical management.  Plan:  Sheath removal and PACU holding area for manual pressure hemostasis.  Likely discharge later on today by primary team.  He cannot take beta blockers due to bradycardia, so for into anginal medications we will add Ranexa and amlodipine. Reduce dose of ACE inhibitor to allow blood pressure room for amlodipine. Would not restart HCTZ.   He will need follow-up with me earlier than already planned.     Medication changes below:  D/C home HCTZ. Decrease lisinopril from 40mg  daily to 20mg  daily. Add amlodipine 5mg  and ranexa 500mg  BID for antianginal. No BB due to bradycardia. Discharge on atorvastatin, not previous simvastatin  Per Dr. Ellyn Hack, ok to discharge from cardiac perspective later today after bedrest. I will arrange outpatient followup with Dr. Ellyn Hack.  Hilbert Corrigan PA Pager: [phone]

## 2014-08-04 NOTE — Progress Notes (Signed)
Patient Name: [name] Date of Encounter: 08/04/2014  Primary Cardiologist: Dr. Ellyn Hack   Principal Problem:   Chest pain at rest Active Problems:   Hyperlipidemia with target LDL less than 70   Essential hypertension   CAD -> CABG x3 then Re-DO CABG x1 (LRad-dLAD) after atretic LIMA & LAD stent occlusion.   DM (diabetes mellitus) type II controlled peripheral vascular disorder   Peripheral arterial disease - left vertebral and subclavian artery stenosis status post PTA/stent   Diarrhea   GERD (gastroesophageal reflux disease)   Abdominal pain   Weight loss   Angina at rest   Abnormal stress test   Ischemic chest pain    SUBJECTIVE  Denies any SOB. Persistent CP for 2-3 wks  CURRENT MEDS . aspirin EC  325 mg Oral Daily  . atorvastatin  40 mg Oral q1800  . brimonidine  1 drop Right Eye TID  . cholecalciferol  1,000 Units Oral Daily  . clopidogrel  75 mg Oral Daily  . diphenhydrAMINE  25 mg Intravenous Pre-Cath  . doxazosin  2 mg Oral QHS  . famotidine (PEPCID) IV  20 mg Intravenous Pre-Cath  . gabapentin  300 mg Oral TID  . heparin  5,000 Units Subcutaneous 3 times per day  . insulin aspart  0-9 Units Subcutaneous TID WC  . insulin glargine  8 Units Subcutaneous QHS  . latanoprost  1 drop Both Eyes QHS  . lisinopril  40 mg Oral Daily  . omega-3 acid ethyl esters  1 g Oral Daily  . pantoprazole  40 mg Oral Daily  . predniSONE  60 mg Oral Pre-Cath  . saccharomyces boulardii  250 mg Oral BID  . sodium chloride  3 mL Intravenous Q12H  . sodium chloride  3 mL Intravenous Q12H  . timolol  1 drop Left Eye TID    OBJECTIVE  Filed Vitals:   08/03/14 1510 08/03/14 2116 08/04/14 0500 08/04/14 0659  BP: 90/40 107/56 120/45   Pulse: 53 56 55   Temp: 98 F (36.7 C) 97.5 F (36.4 C) 97.7 F (36.5 C)   TempSrc: Oral Oral Oral   Resp: 16 17 17    Height:      Weight:    123 lb 8 oz (56.019 kg)  SpO2: 97% 97% 97%     Intake/Output Summary (Last 24 hours) at  08/04/14 0939 Last data filed at 08/04/14 0130  Gross per 24 hour  Intake    720 ml  Output    725 ml  Net     -5 ml   Filed Weights   08/02/14 1845 08/04/14 0659  Weight: 122 lb 1.6 oz (55.384 kg) 123 lb 8 oz (56.019 kg)    PHYSICAL EXAM  General: Pleasant, NAD. Neuro: Alert and oriented X 3. Moves all extremities spontaneously. Psych: Normal affect. HEENT:  Normal  Neck: Supple without bruits or JVD. Lungs:  Resp regular and unlabored, CTA. Heart: RRR no s3, s4, or murmurs. Abdomen: Soft, non-tender, non-distended, BS + x 4.  Extremities: No clubbing, cyanosis or edema. DP/PT/Radials 2+ and equal bilaterally.  Accessory Clinical Findings  CBC  Recent Labs  08/02/14 1223  WBC 5.4  HGB 14.3  HCT 40.5  MCV 84.7  PLT Q000111Q   Basic Metabolic Panel  Recent Labs  08/03/14 0654 08/04/14 0338  NA 137 136  K 3.6 4.0  CL 106 108  CO2 26 22  GLUCOSE 86 189*  BUN 14 17  CREATININE 0.97 0.88  CALCIUM 8.8* 8.8*   Liver Function Tests  Recent Labs  08/02/14 1821  AST 17  ALT 17  ALKPHOS 28*  BILITOT 0.8  PROT 6.4*  ALBUMIN 3.7   Cardiac Enzymes  Recent Labs  08/02/14 2012 08/03/14 0055 08/03/14 0654  TROPONINI <0.03 <0.03 <0.03  Thyroid Function Tests  Recent Labs  08/02/14 2012  TSH 1.466    TELE NSR without significant ventricular ectopy    ECG  No new EKG  Echocardiogram 08/03/2014  LV EF: 60% -  65%  ------------------------------------------------------------------- Indications:   Chest pain 786.51.  ------------------------------------------------------------------- Study Conclusions  - Left ventricle: The cavity size was normal. There was mild focal basal hypertrophy of the septum. Systolic function was normal. The estimated ejection fraction was in the range of 60% to 65%. Wall motion was normal; there were no regional wall motion abnormalities. - Aortic valve: There was trivial regurgitation. - Left atrium:  The atrium was mildly dilated. - Atrial septum: No defect or patent foramen ovale was identified.     Radiology/Studies  Dg Chest 2 View  08/02/2014   CLINICAL DATA:  81 year old male with chest pain and dizziness.  EXAM: CHEST  2 VIEW  COMPARISON:  07/28/2014 and prior radiographs  FINDINGS: CABG changes and left subclavian stent again noted.  The cardiomediastinal silhouette is unchanged.  There is no evidence of focal airspace disease, pulmonary edema, suspicious pulmonary nodule/mass, pleural effusion, or pneumothorax.  No acute bony abnormalities are identified.  IMPRESSION: No active cardiopulmonary disease.   Electronically Signed   By: Margarette Canada M.D.   On: 08/02/2014 13:15   Nm Myocar Multi W/spect W/wall Motion / Ef  08/03/2014   CLINICAL DATA:  81-year-old male with chest pain  EXAM: MYOCARDIAL IMAGING WITH SPECT (REST AND PHARMACOLOGIC-STRESS)  GATED LEFT VENTRICULAR WALL MOTION STUDY  LEFT VENTRICULAR EJECTION FRACTION  TECHNIQUE: Standard myocardial SPECT imaging was performed after resting intravenous injection of 10 mCi Tc-29m sestamibi. Subsequently, intravenous infusion of Lexiscan was performed under the supervision of the Cardiology staff. At peak effect of the drug, 30 mCi Tc-79m sestamibi was injected intravenously and standard myocardial SPECT imaging was performed. Quantitative gated imaging was also performed to evaluate left ventricular wall motion, and estimate left ventricular ejection fraction.  COMPARISON:  None.  FINDINGS: Perfusion: There is a small sized defect of moderate severity within the mid and apical segment of the anterior wall which improves from stress to rest.  Wall Motion: Normal left ventricular wall motion. No left ventricular dilation.  Left Ventricular Ejection Fraction: 69 %  End diastolic volume 79 ml  End systolic volume 24 ml  IMPRESSION: 1. Small reversible perfusion defect within the anterior wall anteriorly.  2. Normal left ventricular wall motion.   3. Left ventricular ejection fraction 69%  4. Intermediate-risk stress test findings*.  *2012 Appropriate Use Criteria for Coronary Revascularization Focused Update: J Am Coll Cardiol. [id number]. http://content.[email or web address].aspx?articleid=1201161  These results will be called to the ordering clinician or representative by the Radiologist Assistant, and communication documented in the PACS or zVision Dashboard.   Electronically Signed   By: Suzy Bouchard M.D.   On: 08/03/2014 12:11    ASSESSMENT AND PLAN  1. Atypical chest pain with abnormal myoview   - persistent CP for 2-3 weeks, worse with deep inspiration  - Echo 08/03/2014 EF 60-65%, no RWMA.   - Myoview 7/31 EF 69%, small reversible perfusion defect within the anterior wall anteriorly, intermediate-risk stress finding.   - pending GI eval. Cath  today at 12, Risk and benefit of procedure explained to the patient who display clear understanding and agree to proceed. Discussed with patient possible procedural risk include bleeding, vascular injury, renal injury, arrythmia, MI, stroke and loss of limb or life.   2. CAD CABG in 1995 with- LIMA-LAD, SVG-OM2, SVG-rPDA, PCI in 2000 ostial & prox-mid LAD (BMS) for atretic LIMA-LAD and redo CABG in 2001 L Radial-LAD after 2 failed attempts @ LIMA-LAD PTCA; LAD stents 100% occluded. All grafts patent in 2013  3. HTN: holding HCTZ   4. HLD   Signed, Woodward Ku Pager: [phone]  I have personally seen and examined this patient with Almyra Deforest, PA-C. I agree with the assessment and plan as outlined above. He has chest pain with known CAD s/p CABG, intermediate risk stress myoview. Cannot exclude unstable angina. Plans for cardiac cath today with possible PCI. May be able to use right radial since his IMA is atretic. Left radial artery used for graft conduit to LAD in second CABG.   [name] 08/04/2014 10:11 AM

## 2014-08-04 NOTE — Progress Notes (Signed)
Site area: RFA Site Prior to Removal:  Level 0 Pressure Applied For:20 min Manual: yes   Patient Status During Pull:  stable Post Pull Site:  Level 0 Post Pull Instructions Given:  yes Post Pull Pulses Present: palpable Dressing Applied:  clear Bedrest begins @ 1350 till 1750 Comments:

## 2014-08-04 NOTE — H&P (View-Only) (Signed)
Patient Name: [name] Date of Encounter: 08/04/2014  Primary Cardiologist: Dr. Ellyn Hack   Principal Problem:   Chest pain at rest Active Problems:   Hyperlipidemia with target LDL less than 70   Essential hypertension   CAD -> CABG x3 then Re-DO CABG x1 (LRad-dLAD) after atretic LIMA & LAD stent occlusion.   DM (diabetes mellitus) type II controlled peripheral vascular disorder   Peripheral arterial disease - left vertebral and subclavian artery stenosis status post PTA/stent   Diarrhea   GERD (gastroesophageal reflux disease)   Abdominal pain   Weight loss   Angina at rest   Abnormal stress test   Ischemic chest pain    SUBJECTIVE  Denies any SOB. Persistent CP for 2-3 wks  CURRENT MEDS . aspirin EC  325 mg Oral Daily  . atorvastatin  40 mg Oral q1800  . brimonidine  1 drop Right Eye TID  . cholecalciferol  1,000 Units Oral Daily  . clopidogrel  75 mg Oral Daily  . diphenhydrAMINE  25 mg Intravenous Pre-Cath  . doxazosin  2 mg Oral QHS  . famotidine (PEPCID) IV  20 mg Intravenous Pre-Cath  . gabapentin  300 mg Oral TID  . heparin  5,000 Units Subcutaneous 3 times per day  . insulin aspart  0-9 Units Subcutaneous TID WC  . insulin glargine  8 Units Subcutaneous QHS  . latanoprost  1 drop Both Eyes QHS  . lisinopril  40 mg Oral Daily  . omega-3 acid ethyl esters  1 g Oral Daily  . pantoprazole  40 mg Oral Daily  . predniSONE  60 mg Oral Pre-Cath  . saccharomyces boulardii  250 mg Oral BID  . sodium chloride  3 mL Intravenous Q12H  . sodium chloride  3 mL Intravenous Q12H  . timolol  1 drop Left Eye TID    OBJECTIVE  Filed Vitals:   08/03/14 1510 08/03/14 2116 08/04/14 0500 08/04/14 0659  BP: 90/40 107/56 120/45   Pulse: 53 56 55   Temp: 98 F (36.7 C) 97.5 F (36.4 C) 97.7 F (36.5 C)   TempSrc: Oral Oral Oral   Resp: 16 17 17    Height:      Weight:    123 lb 8 oz (56.019 kg)  SpO2: 97% 97% 97%     Intake/Output Summary (Last 24 hours) at  08/04/14 0939 Last data filed at 08/04/14 0130  Gross per 24 hour  Intake    720 ml  Output    725 ml  Net     -5 ml   Filed Weights   08/02/14 1845 08/04/14 0659  Weight: 122 lb 1.6 oz (55.384 kg) 123 lb 8 oz (56.019 kg)    PHYSICAL EXAM  General: Pleasant, NAD. Neuro: Alert and oriented X 3. Moves all extremities spontaneously. Psych: Normal affect. HEENT:  Normal  Neck: Supple without bruits or JVD. Lungs:  Resp regular and unlabored, CTA. Heart: RRR no s3, s4, or murmurs. Abdomen: Soft, non-tender, non-distended, BS + x 4.  Extremities: No clubbing, cyanosis or edema. DP/PT/Radials 2+ and equal bilaterally.  Accessory Clinical Findings  CBC  Recent Labs  08/02/14 1223  WBC 5.4  HGB 14.3  HCT 40.5  MCV 84.7  PLT Q000111Q   Basic Metabolic Panel  Recent Labs  08/03/14 0654 08/04/14 0338  NA 137 136  K 3.6 4.0  CL 106 108  CO2 26 22  GLUCOSE 86 189*  BUN 14 17  CREATININE 0.97 0.88  CALCIUM 8.8* 8.8*   Liver Function Tests  Recent Labs  08/02/14 1821  AST 17  ALT 17  ALKPHOS 28*  BILITOT 0.8  PROT 6.4*  ALBUMIN 3.7   Cardiac Enzymes  Recent Labs  08/02/14 2012 08/03/14 0055 08/03/14 0654  TROPONINI <0.03 <0.03 <0.03  Thyroid Function Tests  Recent Labs  08/02/14 2012  TSH 1.466    TELE NSR without significant ventricular ectopy    ECG  No new EKG  Echocardiogram 08/03/2014  LV EF: 60% -  65%  ------------------------------------------------------------------- Indications:   Chest pain 786.51.  ------------------------------------------------------------------- Study Conclusions  - Left ventricle: The cavity size was normal. There was mild focal basal hypertrophy of the septum. Systolic function was normal. The estimated ejection fraction was in the range of 60% to 65%. Wall motion was normal; there were no regional wall motion abnormalities. - Aortic valve: There was trivial regurgitation. - Left atrium:  The atrium was mildly dilated. - Atrial septum: No defect or patent foramen ovale was identified.     Radiology/Studies  Dg Chest 2 View  08/02/2014   CLINICAL DATA:  81 year old male with chest pain and dizziness.  EXAM: CHEST  2 VIEW  COMPARISON:  07/28/2014 and prior radiographs  FINDINGS: CABG changes and left subclavian stent again noted.  The cardiomediastinal silhouette is unchanged.  There is no evidence of focal airspace disease, pulmonary edema, suspicious pulmonary nodule/mass, pleural effusion, or pneumothorax.  No acute bony abnormalities are identified.  IMPRESSION: No active cardiopulmonary disease.   Electronically Signed   By: Margarette Canada M.D.   On: 08/02/2014 13:15   Nm Myocar Multi W/spect W/wall Motion / Ef  08/03/2014   CLINICAL DATA:  42-year-old male with chest pain  EXAM: MYOCARDIAL IMAGING WITH SPECT (REST AND PHARMACOLOGIC-STRESS)  GATED LEFT VENTRICULAR WALL MOTION STUDY  LEFT VENTRICULAR EJECTION FRACTION  TECHNIQUE: Standard myocardial SPECT imaging was performed after resting intravenous injection of 10 mCi Tc-55m sestamibi. Subsequently, intravenous infusion of Lexiscan was performed under the supervision of the Cardiology staff. At peak effect of the drug, 30 mCi Tc-44m sestamibi was injected intravenously and standard myocardial SPECT imaging was performed. Quantitative gated imaging was also performed to evaluate left ventricular wall motion, and estimate left ventricular ejection fraction.  COMPARISON:  None.  FINDINGS: Perfusion: There is a small sized defect of moderate severity within the mid and apical segment of the anterior wall which improves from stress to rest.  Wall Motion: Normal left ventricular wall motion. No left ventricular dilation.  Left Ventricular Ejection Fraction: 69 %  End diastolic volume 79 ml  End systolic volume 24 ml  IMPRESSION: 1. Small reversible perfusion defect within the anterior wall anteriorly.  2. Normal left ventricular wall motion.   3. Left ventricular ejection fraction 69%  4. Intermediate-risk stress test findings*.  *2012 Appropriate Use Criteria for Coronary Revascularization Focused Update: J Am Coll Cardiol. [id number]. http://[email or web address]?articleid=1201161  These results will be called to the ordering clinician or representative by the Radiologist Assistant, and communication documented in the PACS or zVision Dashboard.   Electronically Signed   By: Suzy Bouchard M.D.   On: 08/03/2014 12:11    ASSESSMENT AND PLAN  1. Atypical chest pain with abnormal myoview   - persistent CP for 2-3 weeks, worse with deep inspiration  - Echo 08/03/2014 EF 60-65%, no RWMA.   - Myoview 7/31 EF 69%, small reversible perfusion defect within the anterior wall anteriorly, intermediate-risk stress finding.   - pending GI eval. Cath  today at 12, Risk and benefit of procedure explained to the patient who display clear understanding and agree to proceed. Discussed with patient possible procedural risk include bleeding, vascular injury, renal injury, arrythmia, MI, stroke and loss of limb or life.   2. CAD CABG in 1995 with- LIMA-LAD, SVG-OM2, SVG-rPDA, PCI in 2000 ostial & prox-mid LAD (BMS) for atretic LIMA-LAD and redo CABG in 2001 L Radial-LAD after 2 failed attempts @ LIMA-LAD PTCA; LAD stents 100% occluded. All grafts patent in 2013  3. HTN: holding HCTZ   4. HLD   Signed, Woodward Ku Pager: [phone]  I have personally seen and examined this patient with Almyra Deforest, PA-C. I agree with the assessment and plan as outlined above. He has chest pain with known CAD s/p CABG, intermediate risk stress myoview. Cannot exclude unstable angina. Plans for cardiac cath today with possible PCI. May be able to use right radial since his IMA is atretic. Left radial artery used for graft conduit to LAD in second CABG.   MCALHANY,CHRISTOPHER 08/04/2014 10:11 AM

## 2014-08-04 NOTE — Progress Notes (Addendum)
Inpatient Diabetes Program Recommendations  AACE/ADA: New Consensus Statement on Inpatient Glycemic Control (2013)  Target Ranges:  Prepandial:   less than 140 mg/dL      Peak postprandial:   less than 180 mg/dL (1-2 hours)      Critically ill patients:  140 - 180 mg/dL   Reason for Admission: CP  Diabetes history: DM 2 Outpatient Diabetes medications: Glipizide 10 mg BID, 70/30 36 units Daily, Metformin 1,000 BID Current orders for Inpatient glycemic control: Lantus 8 units, Novolog Sensitive   Note: Patient had hypoglycemia after receiving a total of 5 units of Novolog for a glucose of 175 mg/dl. Will watch trends on current regimen.  At time of discharge, I would recommend keeping one of the oral medications but decrease the dose by half. Decreasing the frequency of dose would also help instead of the patient taking it BID, such as Glipizide 5 mg Daily. Or you could decrease the 70/30 dose on discharge based on his weight.   Thanks,  Tama Headings RN, MSN, [facility] Inpatient Diabetes Coordinator Team Pager [phone]

## 2014-08-04 NOTE — Progress Notes (Signed)
PATIENT DETAILS Name: [name] Age: 81 y.o. Sex: male Date of Birth: [birth year] Admit Date: 08/02/2014 Admitting Physician Theodis Blaze, MD [id number], Lennette Bihari, MD  Subjective: Still complains of mild chest pain.Diarrhea resolved  Assessment/Plan: Principal Problem: Chest pain:known hx of CAD-stress test positive-continue ASA/Plavix,statin, Lisinopril-will add Beta Blocker if BP tolerates. Cards consulted-LHC scheduled for today  Active Problems: Diarrhea: seems to have resolved. If recurs-will repeat C Diff PCR. Suspect no role for GI consultation at this time.  DM-2: CBG's stable-but did have episodes of hypoglycemia. Apparently only takes Insulin at home if CBG's >200. Will stop Lantus and maintain on SSI. Await A1C.   HTN: controlled, continue Lisinopril  Dyslipidemia:continue Statin  Peripheral vascular disease:continue ASA/Plavix and statin  Peripheral neuropathy:Continue gabapentin  GERD:PPI  Disposition: Remain inpatient-home likely 8/2-depending on cardiac cath  Antimicrobial agents  See below  Anti-infectives    None      DVT Prophylaxis: Prophylactic Heparin   Code Status: Full code   Family Communication None at bedside  Procedures: None  CONSULTS:  cardiology and GI  Time spent 25 minutes-Greater than 50% of this time was spent in counseling, explanation of diagnosis, planning of further management, and coordination of care.  MEDICATIONS: Scheduled Meds: . [MAR Hold] aspirin EC  325 mg Oral Daily  . [MAR Hold] atorvastatin  40 mg Oral q1800  . [MAR Hold] brimonidine  1 drop Right Eye TID  . [MAR Hold] cholecalciferol  1,000 Units Oral Daily  . [MAR Hold] clopidogrel  75 mg Oral Daily  . [MAR Hold] doxazosin  2 mg Oral QHS  . [MAR Hold] gabapentin  300 mg Oral TID  . [MAR Hold] heparin  5,000 Units Subcutaneous 3 times per day  . [MAR Hold] insulin aspart  0-9 Units Subcutaneous TID WC  . [MAR Hold] insulin  glargine  8 Units Subcutaneous QHS  . [MAR Hold] latanoprost  1 drop Both Eyes QHS  . [MAR Hold] lisinopril  40 mg Oral Daily  . [MAR Hold] omega-3 acid ethyl esters  1 g Oral Daily  . [MAR Hold] pantoprazole  40 mg Oral Daily  . [MAR Hold] saccharomyces boulardii  250 mg Oral BID  . [MAR Hold] sodium chloride  3 mL Intravenous Q12H  . sodium chloride  3 mL Intravenous Q12H  . [MAR Hold] timolol  1 drop Left Eye TID   Continuous Infusions: . sodium chloride 1 mL/kg/hr (08/03/14 2121)   PRN Meds:.sodium chloride, [MAR Hold] acetaminophen **OR** [MAR Hold] acetaminophen, [MAR Hold] ondansetron **OR** [MAR Hold] ondansetron (ZOFRAN) IV, sodium chloride    PHYSICAL EXAM: Vital signs in last 24 hours: Filed Vitals:   08/03/14 2116 08/04/14 0500 08/04/14 0659 08/04/14 1043  BP: 107/56 120/45  145/55  Pulse: 56 55    Temp: 97.5 F (36.4 C) 97.7 F (36.5 C)    TempSrc: Oral Oral    Resp: 17 17    Height:      Weight:   56.019 kg (123 lb 8 oz)   SpO2: 97% 97%      Weight change: 0.635 kg (1 lb 6.4 oz) Filed Weights   08/02/14 1845 08/04/14 0659  Weight: 55.384 kg (122 lb 1.6 oz) 56.019 kg (123 lb 8 oz)   Body mass index is 19.34 kg/(m^2).   Gen Exam: Awake and alert with clear speech.   Neck: Supple, No JVD.   Chest: B/L Clear.  CVS: S1 S2 Regular, no murmurs.  Abdomen: soft, BS +, non tender, non distended.  Extremities: no edema, lower extremities warm to touch. Neurologic: Non Focal.   Skin: No Rash.   Wounds: N/A.    Intake/Output from previous day:  Intake/Output Summary (Last 24 hours) at 08/04/14 1235 Last data filed at 08/04/14 0130  Gross per 24 hour  Intake    720 ml  Output    325 ml  Net    395 ml     LAB RESULTS: CBC  Recent Labs Lab 08/02/14 1223  WBC 5.4  HGB 14.3  HCT 40.5  PLT 214  MCV 84.7  MCH 29.9  MCHC 35.3  RDW 12.5    Chemistries   Recent Labs Lab 08/02/14 1223 08/03/14 0654 08/04/14 0338  NA 135 137 136  K 4.4 3.6  4.0  CL 100* 106 108  CO2 20* 26 22  GLUCOSE 156* 86 189*  BUN 18 14 17   CREATININE 1.12 0.97 0.88  CALCIUM 9.9 8.8* 8.8*    CBG:  Recent Labs Lab 08/03/14 1233 08/03/14 1654 08/03/14 1731 08/03/14 2117 08/04/14 0735  GLUCAP 175* 58* 82 289* 137*    GFR Estimated Creatinine Clearance: 53 mL/min (by C-G formula based on Cr of 0.88).  Coagulation profile  Recent Labs Lab 08/04/14 0338  INR 1.13    Cardiac Enzymes  Recent Labs Lab 08/02/14 2012 08/03/14 0055 08/03/14 0654  TROPONINI <0.03 <0.03 <0.03    Invalid input(s): POCBNP No results for input(s): DDIMER in the last 72 hours. No results for input(s): HGBA1C in the last 72 hours. No results for input(s): CHOL, HDL, LDLCALC, TRIG, CHOLHDL, LDLDIRECT in the last 72 hours.  Recent Labs  08/02/14 2012  TSH 1.466   No results for input(s): VITAMINB12, FOLATE, FERRITIN, TIBC, IRON, RETICCTPCT in the last 72 hours. No results for input(s): LIPASE, AMYLASE in the last 72 hours.  Urine Studies No results for input(s): UHGB, CRYS in the last 72 hours.  Invalid input(s): UACOL, UAPR, USPG, UPH, UTP, UGL, UKET, UBIL, UNIT, UROB, ULEU, UEPI, UWBC, URBC, UBAC, CAST, UCOM, BILUA  MICROBIOLOGY: No results found for this or any previous visit (from the past 240 hour(s)).  RADIOLOGY STUDIES/RESULTS: Dg Chest 2 View  08/02/2014   CLINICAL DATA:  81 year old male with chest pain and dizziness.  EXAM: CHEST  2 VIEW  COMPARISON:  07/28/2014 and prior radiographs  FINDINGS: CABG changes and left subclavian stent again noted.  The cardiomediastinal silhouette is unchanged.  There is no evidence of focal airspace disease, pulmonary edema, suspicious pulmonary nodule/mass, pleural effusion, or pneumothorax.  No acute bony abnormalities are identified.  IMPRESSION: No active cardiopulmonary disease.   Electronically Signed   By: Margarette Canada M.D.   On: 08/02/2014 13:15   Nm Myocar Multi W/spect W/wall Motion / Ef  08/03/2014    CLINICAL DATA:  65-year-old male with chest pain  EXAM: MYOCARDIAL IMAGING WITH SPECT (REST AND PHARMACOLOGIC-STRESS)  GATED LEFT VENTRICULAR WALL MOTION STUDY  LEFT VENTRICULAR EJECTION FRACTION  TECHNIQUE: Standard myocardial SPECT imaging was performed after resting intravenous injection of 10 mCi Tc-30m sestamibi. Subsequently, intravenous infusion of Lexiscan was performed under the supervision of the Cardiology staff. At peak effect of the drug, 30 mCi Tc-37m sestamibi was injected intravenously and standard myocardial SPECT imaging was performed. Quantitative gated imaging was also performed to evaluate left ventricular wall motion, and estimate left ventricular ejection fraction.  COMPARISON:  None.  FINDINGS: Perfusion: There is a  small sized defect of moderate severity within the mid and apical segment of the anterior wall which improves from stress to rest.  Wall Motion: Normal left ventricular wall motion. No left ventricular dilation.  Left Ventricular Ejection Fraction: 69 %  End diastolic volume 79 ml  End systolic volume 24 ml  IMPRESSION: 1. Small reversible perfusion defect within the anterior wall anteriorly.  2. Normal left ventricular wall motion.  3. Left ventricular ejection fraction 69%  4. Intermediate-risk stress test findings*.  *2012 Appropriate Use Criteria for Coronary Revascularization Focused Update: J Am Coll Cardiol. [id number]. http://content.airportbarriers.com.aspx?articleid=1201161  These results will be called to the ordering clinician or representative by the Radiologist Assistant, and communication documented in the PACS or zVision Dashboard.   Electronically Signed   By: Suzy Bouchard M.D.   On: 08/03/2014 12:11    Oren Binet, MD  [facility] Pager:[phone]  If 7PM-7AM, please contact night-coverage [email or web address] Password [email or web address] 08/04/2014, 12:35 PM   LOS: 2 days

## 2014-08-04 NOTE — Interval H&P Note (Signed)
History and Physical Interval Note:  08/04/2014 12:30 PM  [name]  has presented today for surgery, with the diagnosis of unstable angina with abnormal stress test.\  The various methods of treatment have been discussed with the patient and family. After consideration of risks, benefits and other options for treatment, the patient has consented to  Procedure(s): Left Heart Cath and Cors/Grafts Angiography (N/A) & Possible Percutaneous Coronary Intervention as a surgical intervention .  The patient's history has been reviewed, patient examined, no change in status, stable for surgery.  I have reviewed the patient's chart and labs.  Questions were answered to the patient's satisfaction.     [place], [place]  Cath Lab Visit (complete for each Cath Lab visit)  Clinical Evaluation Leading to the Procedure:   ACS: Yes.    Non-ACS:    Anginal Classification: CCS III  Anti-ischemic medical therapy: Minimal Therapy (1 class of medications)  Non-Invasive Test Results: Intermediate-risk stress test findings: cardiac mortality 1-3%/year  Prior CABG: Previous CABG  Ischemic Symptoms? CCS III (Marked limitation of ordinary activity) Anti-ischemic Medical Therapy? Minimal Therapy (1 class of medications) Non-invasive Test Results? Intermediate-risk stress test findings: cardiac mortality 1-3%/year Prior CABG? Previous CABG   Patient Information:   >=1 SVG stenosis  A (7)  Indication: 52; Score: 7   Patient Information:   All bypass grafts patent, >=1 lesion(s) in native coronaries without bypass grafts  A (7)  Indication: 52; Score: 7   Patient Information:   Native 3V-CAD, failure of multiple grafts, depressed LVEF, patent LIMA graft PCI  U (6)  Indication: 68; Score: 6   Patient Information:   Native 3V-CAD, failure of multiple grafts, depressed LVEF, patent LIMA graft CABG  A (7)  Indication: 68; Score: 7   Patient Information:   Native 3V-CAD, failure of  multiple grafts, depressed LVEF, nonfunctional LIMA graft PCI  A (8)  Indication: 69; Score: 8   Patient Information:   Native 3V-CAD, failure of multiple grafts, depressed LVEF, nonfunctional LIMA graft CABG  U (6)  Indication: 69; Score: 6   HARDING, Leonie Green, M.D., M.S. Interventional Cardiologist   Pager # [phone]

## 2014-08-05 ENCOUNTER — Telehealth: Payer: Self-pay | Admitting: Cardiology

## 2014-08-05 LAB — TISSUE TRANSGLUTAMINASE, IGG

## 2014-08-05 LAB — GLUCOSE, CAPILLARY: Glucose-Capillary: 124 mg/dL — ABNORMAL HIGH (ref 65–99)

## 2014-08-05 LAB — TISSUE TRANSGLUTAMINASE, IGA: Tissue Transglutaminase Ab, IgA: <2 U/mL (ref 0–3)

## 2014-08-05 MED ORDER — ISOSORBIDE MONONITRATE ER 60 MG PO TB24: 60.0000 mg | ORAL_TABLET | Freq: Every day | ORAL | Status: DC

## 2014-08-05 MED FILL — Lidocaine HCl Local Preservative Free (PF) Inj 1%: INTRAMUSCULAR | Qty: 30 | Status: AC

## 2014-08-05 MED FILL — Heparin Sodium (Porcine) 2 Unit/ML in Sodium Chloride 0.9%: INTRAMUSCULAR | Qty: 1500 | Status: AC

## 2014-08-05 NOTE — Telephone Encounter (Signed)
Pt says he can not afford the Ranolazine.He wants to know if there is something he can take?

## 2014-08-05 NOTE — Telephone Encounter (Signed)
Discussed with dr Ellyn Hack, pt to take isosorbide 60 mg once daily. Script called to [facility].

## 2014-08-07 DIAGNOSIS — E1165 Type 2 diabetes mellitus with hyperglycemia: Secondary | ICD-10-CM | POA: Diagnosis not present

## 2014-08-07 DIAGNOSIS — I2510 Atherosclerotic heart disease of native coronary artery without angina pectoris: Secondary | ICD-10-CM | POA: Diagnosis not present

## 2014-09-01 ENCOUNTER — Ambulatory Visit (INDEPENDENT_AMBULATORY_CARE_PROVIDER_SITE_OTHER): Payer: Medicare Other | Admitting: Cardiology

## 2014-09-01 ENCOUNTER — Encounter: Payer: Self-pay | Admitting: Cardiology

## 2014-09-01 VITALS — BP 120/50 | HR 63 | Ht 67.0 in | Wt 134.0 lb

## 2014-09-01 DIAGNOSIS — I10 Essential (primary) hypertension: Secondary | ICD-10-CM

## 2014-09-01 MED ORDER — ISOSORBIDE MONONITRATE ER 60 MG PO TB24: 90.0000 mg | ORAL_TABLET | Freq: Every day | ORAL | Status: DC

## 2014-09-01 NOTE — Progress Notes (Signed)
09/01/2014 CLERANCE UZZLE   Jan 19, 1932  [id number]  Primary Physician Rory Percy, MD Primary Cardiologist: Dr. Ellyn Hack   Reason for Visit/CC: Post hospital follow-up for chest pain; status post left heart catheterization  HPI:  The patient is a 81 y.o. Male, followed by Dr. Ellyn Hack, with a PMHx of significant CAD s/p CABG in 1995 with- LIMA-LAD, SVG-OM2, SVG-rPDA, PCI in 2000 ostial & prox-mid LAD (BMS) for atretic LIMA-LAD and redo CABG in 2001 L Radial-LAD after 2 failed attempts @ LIMA-LAD PTCA; LAD stents 100% occluded. All grafts patent in 2013.   The patient recently was admitted to [facility] with complaints of worsening diarrhea and nausea but no vomiting. No melanotic stools were noted. Stool cultures were negative.  Internal medicine felt that this possibly related to metformin therapy. As a result his metformin was discontinued and his glipizide was increased to 5 mg daily. Decision was made to refer to gastroenterology as an outpatient if his diarrhea continued.  During his hospitalization, he developed significant retrosternal chest pressure. Symptoms were not relieved with nitroglycerin. However, the patient noted that his symptoms felt very similar to his prior chest pains prior to undergoing CABG. Subsequently, he underwent an ischemic eval. Cardiac enzymes were cycled and were negative 3. He underwent a nuclear stress test which revealed a small reversible perfusion defect within the anterior wall. EF was estimated to 69%. This was interpreted as an intermediate risk test. Subsequently he underwent a left heart catheterization performed by Dr. Ellyn Hack. The culprit lesion for the patient's chest pain and abnormal stress test was felt to be the mid LAD. His cath showed poor retrograde filling of the mid LAD with 95% in-stent restenosis. However it was felt this was not amenable to PCI. Therefore continued medical therapy was recommended. The decision was made to place on  Ranexa however after discharge, the patient discovered that he could not afford the medication as his co-pay was $200 a month. He called the office and Dr. Ellyn Hack recommended treatment with Imdur. He was placed on 60 mg daily.  He presents to clinic today for post hospital follow-up. He is accompanied by his wife. He states that since discharge, he has continued to have chest discomfort that occurs intermittently. He reports full medication compliance.  He reports that his diarrhea and nausea have resolved.    Current Outpatient Prescriptions  Medication Sig Dispense Refill  . acetaminophen (TYLENOL) 500 MG tablet Take 1,000 mg by mouth every 6 (six) hours as needed for mild pain or headache.    . Ascorbic Acid (VITAMIN C) 1000 MG tablet Take 1,000 mg by mouth daily.    [name] aspirin EC 81 MG tablet Take 81 mg by mouth daily.    [name] atorvastatin (LIPITOR) 40 MG tablet Take 1 tablet (40 mg total) by mouth daily at 6 PM. 30 tablet 0  . brimonidine (ALPHAGAN P) 0.1 % SOLN Place 1 drop into the right eye 3 (three) times daily.     . Cholecalciferol (VITAMIN D-3) 1000 UNITS CAPS Take 1,000 Units by mouth daily.    . clopidogrel (PLAVIX) 75 MG tablet Take 1 tablet (75 mg total) by mouth daily. 30 tablet 1  . doxazosin (CARDURA) 4 MG tablet Take 2 mg by mouth at bedtime.     . gabapentin (NEURONTIN) 300 MG capsule Take 300 mg by mouth 3 (three) times daily.    [name] glipiZIDE (GLUCOTROL XL) 5 MG 24 hr tablet Take 1 tablet (5 mg total) by  mouth daily with breakfast. 30 tablet 0  . isosorbide mononitrate (IMDUR) 60 MG 24 hr tablet Take 1 tablet (60 mg total) by mouth daily. 30 tablet 12  . latanoprost (XALATAN) 0.005 % ophthalmic solution Place 1 drop into both eyes at bedtime.     [name] lisinopril (PRINIVIL,ZESTRIL) 40 MG tablet Take 0.5 tablets (20 mg total) by mouth daily. 30 tablet 0  . loratadine (CLARITIN) 10 MG tablet Take 10 mg by mouth daily as needed. Forb  Seasonal allergies    . Omega-3 Fatty Acids (FISH  OIL) 300 MG CAPS Take 300 mg by mouth daily.    [name] omeprazole (PRILOSEC) 20 MG capsule Take 20 mg by mouth daily.    [name] saccharomyces boulardii (FLORASTOR) 250 MG capsule Take 1 capsule (250 mg total) by mouth 2 (two) times daily. 60 capsule 0  . timolol (TIMOPTIC) 0.5 % ophthalmic solution Place 1 drop into the left eye 3 (three) times daily.      No current facility-administered medications for this visit.    Allergies  Allergen Reactions  . Iohexol Other (See Comments)    Intractable shaking.  . Contrast Media [Iodinated Diagnostic Agents]     Shivering & shaking. Pt reports takes antihistamine prior to procedures.  . Metformin And Related Diarrhea    Subsequently discontinued    Social History   Social History  . Marital Status: Married    Spouse Name: N/A  . Number of Children: N/A  . Years of Education: N/A   Occupational History  . Not on file.   Social History Main Topics  . Smoking status: Former Smoker -- 3.00 packs/day for 30 years    Types: Cigarettes  . Smokeless tobacco: Not on file     Comment: quit smoking about 50 years ago  . Alcohol Use: No  . Drug Use: No  . Sexual Activity: Not on file   Other Topics Concern  . Not on file   Social History Narrative   He is married with one daughter. He has 2 grandchildren.   He does not smoke. He quit smoking in roughly 1970 after smoking 3 packs per day.   He is routinely at give him. It does not necessarily do routine exercise.      Review of Systems: General: negative for chills, fever, night sweats or weight changes.  Cardiovascular: negative for chest pain, dyspnea on exertion, edema, orthopnea, palpitations, paroxysmal nocturnal dyspnea or shortness of breath Dermatological: negative for rash Respiratory: negative for cough or wheezing Urologic: negative for hematuria Abdominal: negative for nausea, vomiting, diarrhea, bright red blood per rectum, melena, or hematemesis Neurologic: negative for visual  changes, syncope, or dizziness All other systems reviewed and are otherwise negative except as noted above.    Blood pressure 120/50, pulse 63, height 5\' 7"  (1.702 m), weight 134 lb (60.782 kg).  General appearance: alert, cooperative and no distress Neck: no carotid bruit and no JVD Lungs: clear to auscultation bilaterally Heart: regular rate and rhythm, S1, S2 normal, no murmur, click, rub or gallop Extremities: warm and dry Pulses: 2+ and symmetric Skin: warm and dry Neurologic: Grossly normal  EKG NSR 63 bpm. No ischemia.   ASSESSMENT AND PLAN:   1. CAD: History of CABG with redo in 2001. Recent left heart catheterization revealed poor retrograde filling of the mid LAD with 95% in-stent restenosis. Not amenable to PCI. Unable to afford Ranexa due to cost. Patient was placed on Imdur 60 mg but continues to have some  recurrent chest discomfort. We will avoid initiation of beta blocker therapy given resting heart rate in the low 60s. His systolic blood pressures in the 120s. We will further attempt to increase his Imdur to 90 mg daily. Continue aspirin and Plavix  2. Diarrhea: resolved after discontinuation of Metformin.   3. DM: on glipizide. Followed by PCP.  4. Hypertension: Blood pressure is well-controlled on current regimen  PLAN Continue to titrate Imdur to 90 mg given issues with recurrent chest discomfort. Continue aspirin and Plavix. Patient instructed to follow-up with Dr. Ellyn Hack in 2-3 months for repeat evaluation. However he was advised to notify our office if he continues to have issues with chest discomfort despite increasing medical therapy.  Lyda Jester PA-C 09/01/2014 4:18 PM

## 2014-09-01 NOTE — Patient Instructions (Signed)
Your physician has recommended you make the following change in your medication: increase the isosorbide to 90 mg daily. ( 1 & 1/2 tablet daily.)  Your physician recommends that you schedule a follow-up appointment in: 3-4 months with Dr. Ellyn Hack.

## 2014-09-13 ENCOUNTER — Inpatient Hospital Stay (HOSPITAL_COMMUNITY)
Admission: EM | Admit: 2014-09-13 | Discharge: 2014-09-17 | DRG: 638 | Disposition: A | Discharge status: Home / Self Care | Payer: Medicare Other | Attending: Internal Medicine | Admitting: Internal Medicine

## 2014-09-13 ENCOUNTER — Emergency Department (HOSPITAL_COMMUNITY): Payer: Medicare Other

## 2014-09-13 ENCOUNTER — Encounter (HOSPITAL_COMMUNITY): Payer: Self-pay | Admitting: Emergency Medicine

## 2014-09-13 DIAGNOSIS — Z955 Presence of coronary angioplasty implant and graft: Secondary | ICD-10-CM | POA: Diagnosis not present

## 2014-09-13 DIAGNOSIS — I2510 Atherosclerotic heart disease of native coronary artery without angina pectoris: Secondary | ICD-10-CM | POA: Diagnosis present

## 2014-09-13 DIAGNOSIS — Z794 Long term (current) use of insulin: Secondary | ICD-10-CM

## 2014-09-13 DIAGNOSIS — E161 Other hypoglycemia: Secondary | ICD-10-CM | POA: Diagnosis not present

## 2014-09-13 DIAGNOSIS — Z7902 Long term (current) use of antithrombotics/antiplatelets: Secondary | ICD-10-CM | POA: Diagnosis not present

## 2014-09-13 DIAGNOSIS — E11649 Type 2 diabetes mellitus with hypoglycemia without coma: Principal | ICD-10-CM | POA: Diagnosis present

## 2014-09-13 DIAGNOSIS — Z7982 Long term (current) use of aspirin: Secondary | ICD-10-CM | POA: Diagnosis not present

## 2014-09-13 DIAGNOSIS — I25118 Atherosclerotic heart disease of native coronary artery with other forms of angina pectoris: Secondary | ICD-10-CM | POA: Diagnosis present

## 2014-09-13 DIAGNOSIS — E1151 Type 2 diabetes mellitus with diabetic peripheral angiopathy without gangrene: Secondary | ICD-10-CM | POA: Diagnosis not present

## 2014-09-13 DIAGNOSIS — R7309 Other abnormal glucose: Secondary | ICD-10-CM | POA: Diagnosis not present

## 2014-09-13 DIAGNOSIS — H409 Unspecified glaucoma: Secondary | ICD-10-CM | POA: Diagnosis present

## 2014-09-13 DIAGNOSIS — E119 Type 2 diabetes mellitus without complications: Secondary | ICD-10-CM

## 2014-09-13 DIAGNOSIS — E782 Mixed hyperlipidemia: Secondary | ICD-10-CM | POA: Diagnosis present

## 2014-09-13 DIAGNOSIS — E785 Hyperlipidemia, unspecified: Secondary | ICD-10-CM

## 2014-09-13 DIAGNOSIS — E162 Hypoglycemia, unspecified: Secondary | ICD-10-CM | POA: Diagnosis present

## 2014-09-13 DIAGNOSIS — R0789 Other chest pain: Secondary | ICD-10-CM | POA: Diagnosis not present

## 2014-09-13 DIAGNOSIS — I25119 Atherosclerotic heart disease of native coronary artery with unspecified angina pectoris: Secondary | ICD-10-CM | POA: Diagnosis present

## 2014-09-13 DIAGNOSIS — E1169 Type 2 diabetes mellitus with other specified complication: Secondary | ICD-10-CM | POA: Diagnosis present

## 2014-09-13 DIAGNOSIS — I10 Essential (primary) hypertension: Secondary | ICD-10-CM | POA: Diagnosis not present

## 2014-09-13 DIAGNOSIS — I25810 Atherosclerosis of coronary artery bypass graft(s) without angina pectoris: Secondary | ICD-10-CM | POA: Diagnosis not present

## 2014-09-13 DIAGNOSIS — I208 Other forms of angina pectoris: Secondary | ICD-10-CM | POA: Diagnosis present

## 2014-09-13 DIAGNOSIS — Z951 Presence of aortocoronary bypass graft: Secondary | ICD-10-CM | POA: Diagnosis not present

## 2014-09-13 DIAGNOSIS — I25708 Atherosclerosis of coronary artery bypass graft(s), unspecified, with other forms of angina pectoris: Secondary | ICD-10-CM | POA: Insufficient documentation

## 2014-09-13 DIAGNOSIS — R079 Chest pain, unspecified: Secondary | ICD-10-CM | POA: Diagnosis not present

## 2014-09-13 DIAGNOSIS — Z87891 Personal history of nicotine dependence: Secondary | ICD-10-CM | POA: Diagnosis not present

## 2014-09-13 DIAGNOSIS — I2089 Other forms of angina pectoris: Secondary | ICD-10-CM | POA: Diagnosis present

## 2014-09-13 LAB — CBC WITH DIFFERENTIAL/PLATELET
BASOS ABS: 0 10*3/uL (ref 0.0–0.1)
BASOS PCT: 0 % (ref 0–1)
EOS PCT: 0 % (ref 0–5)
Eosinophils Absolute: 0 10*3/uL (ref 0.0–0.7)
HCT: 31.8 % — ABNORMAL LOW (ref 39.0–52.0)
Hemoglobin: 11.1 g/dL — ABNORMAL LOW (ref 13.0–17.0)
Lymphocytes Relative: 8 % — ABNORMAL LOW (ref 12–46)
Lymphs Abs: 0.9 10*3/uL (ref 0.7–4.0)
MCH: 30.4 pg (ref 26.0–34.0)
MCHC: 34.9 g/dL (ref 30.0–36.0)
MCV: 87.1 fL (ref 78.0–100.0)
MONO ABS: 0.5 10*3/uL (ref 0.1–1.0)
Monocytes Relative: 4 % (ref 3–12)
Neutro Abs: 10.4 10*3/uL — ABNORMAL HIGH (ref 1.7–7.7)
Neutrophils Relative %: 88 % — ABNORMAL HIGH (ref 43–77)
PLATELETS: 170 10*3/uL (ref 150–400)
RBC: 3.65 MIL/uL — ABNORMAL LOW (ref 4.22–5.81)
RDW: 12.5 % (ref 11.5–15.5)
WBC: 11.7 10*3/uL — ABNORMAL HIGH (ref 4.0–10.5)

## 2014-09-13 LAB — GLUCOSE, CAPILLARY
GLUCOSE-CAPILLARY: 134 mg/dL — AB (ref 65–99)
Glucose-Capillary: 173 mg/dL — ABNORMAL HIGH (ref 65–99)
Glucose-Capillary: 163 mg/dL — ABNORMAL HIGH (ref 65–99)

## 2014-09-13 LAB — I-STAT TROPONIN, ED
TROPONIN I, POC: 0 ng/mL (ref 0.00–0.08)
Troponin i, poc: 0.02 ng/mL (ref 0.00–0.08)

## 2014-09-13 LAB — COMPREHENSIVE METABOLIC PANEL
ALBUMIN: 3.6 g/dL (ref 3.5–5.0)
ALT: 21 U/L (ref 17–63)
AST: 24 U/L (ref 15–41)
Alkaline Phosphatase: 30 U/L — ABNORMAL LOW (ref 38–126)
Anion gap: 9 (ref 5–15)
BUN: 23 mg/dL — ABNORMAL HIGH (ref 6–20)
CHLORIDE: 102 mmol/L (ref 101–111)
CO2: 24 mmol/L (ref 22–32)
Calcium: 9.2 mg/dL (ref 8.9–10.3)
Creatinine, Ser: 1.18 mg/dL (ref 0.61–1.24)
GFR calc Af Amer: >60 mL/min (ref >60)
GFR, EST NON AFRICAN AMERICAN: 56 mL/min — AB (ref >60)
Glucose, Bld: 77 mg/dL (ref 65–99)
POTASSIUM: 3.3 mmol/L — AB (ref 3.5–5.1)
Sodium: 135 mmol/L (ref 135–145)
Total Bilirubin: 0.6 mg/dL (ref 0.3–1.2)
Total Protein: 6.1 g/dL — ABNORMAL LOW (ref 6.5–8.1)

## 2014-09-13 LAB — CBG MONITORING, ED
GLUCOSE-CAPILLARY: 52 mg/dL — AB (ref 65–99)
GLUCOSE-CAPILLARY: 95 mg/dL (ref 65–99)
Glucose-Capillary: 113 mg/dL — ABNORMAL HIGH (ref 65–99)
Glucose-Capillary: 140 mg/dL — ABNORMAL HIGH (ref 65–99)

## 2014-09-13 LAB — I-STAT CG4 LACTIC ACID, ED: LACTIC ACID, VENOUS: 2.41 mmol/L — AB (ref 0.5–2.0)

## 2014-09-13 LAB — MRSA PCR SCREENING: MRSA by PCR: NEGATIVE

## 2014-09-13 LAB — LACTIC ACID, PLASMA: Lactic Acid, Venous: 1.2 mmol/L (ref 0.5–2.0)

## 2014-09-13 MED ORDER — DEXTROSE 50 % IV SOLN
INTRAVENOUS | Status: AC
  Filled 2014-09-13: qty 50

## 2014-09-13 MED ORDER — ACETAMINOPHEN 325 MG PO TABS: 650.0000 mg | ORAL_TABLET | Freq: Four times a day (QID) | ORAL | Status: DC | PRN

## 2014-09-13 MED ORDER — ACETAMINOPHEN 650 MG RE SUPP
650.0000 mg | Freq: Four times a day (QID) | RECTAL | Status: DC | PRN
Start: 2014-09-13 — End: 2014-09-17

## 2014-09-13 MED ORDER — LATANOPROST 0.005 % OP SOLN
1.0000 [drp] | Freq: Every day | OPHTHALMIC | Status: DC
  Filled 2014-09-13: qty 2.5

## 2014-09-13 MED ORDER — ISOSORBIDE MONONITRATE ER 30 MG PO TB24
90.0000 mg | ORAL_TABLET | Freq: Every day | ORAL | Status: DC
  Administered 2014-09-14 – 2014-09-15 (×2): 90 mg via ORAL
  Filled 2014-09-13 (×2): qty 3

## 2014-09-13 MED ORDER — ENOXAPARIN SODIUM 40 MG/0.4ML ~~LOC~~ SOLN
40.0000 mg | SUBCUTANEOUS | Status: DC
  Administered 2014-09-13 – 2014-09-16 (×4): 40 mg via SUBCUTANEOUS
  Filled 2014-09-13 (×5): qty 0.4

## 2014-09-13 MED ORDER — ATORVASTATIN CALCIUM 40 MG PO TABS
40.0000 mg | ORAL_TABLET | Freq: Every day | ORAL | Status: DC
  Administered 2014-09-13 – 2014-09-16 (×4): 40 mg via ORAL
  Filled 2014-09-13 (×5): qty 1

## 2014-09-13 MED ORDER — ONDANSETRON HCL 4 MG PO TABS: 4.0000 mg | ORAL_TABLET | Freq: Four times a day (QID) | ORAL | Status: DC | PRN

## 2014-09-13 MED ORDER — ALBUTEROL SULFATE (2.5 MG/3ML) 0.083% IN NEBU: 2.5000 mg | INHALATION_SOLUTION | Freq: Four times a day (QID) | RESPIRATORY_TRACT | Status: DC

## 2014-09-13 MED ORDER — SACCHAROMYCES BOULARDII 250 MG PO CAPS
250.0000 mg | ORAL_CAPSULE | Freq: Two times a day (BID) | ORAL | Status: DC
  Administered 2014-09-13 – 2014-09-17 (×8): 250 mg via ORAL
  Filled 2014-09-13 (×9): qty 1

## 2014-09-13 MED ORDER — SODIUM CHLORIDE 0.9 % IJ SOLN
3.0000 mL | Freq: Two times a day (BID) | INTRAMUSCULAR | Status: DC
Start: 2014-09-13 — End: 2014-09-14
  Administered 2014-09-13: 3 mL via INTRAVENOUS

## 2014-09-13 MED ORDER — CLOPIDOGREL BISULFATE 75 MG PO TABS
75.0000 mg | ORAL_TABLET | Freq: Every day | ORAL | Status: DC
  Administered 2014-09-14 – 2014-09-17 (×4): 75 mg via ORAL
  Filled 2014-09-13 (×5): qty 1

## 2014-09-13 MED ORDER — SODIUM CHLORIDE 0.9 % IJ SOLN
3.0000 mL | Freq: Two times a day (BID) | INTRAMUSCULAR | Status: DC
  Administered 2014-09-13 – 2014-09-17 (×8): 3 mL via INTRAVENOUS

## 2014-09-13 MED ORDER — LISINOPRIL 20 MG PO TABS
20.0000 mg | ORAL_TABLET | Freq: Every day | ORAL | Status: DC
  Administered 2014-09-14 – 2014-09-17 (×4): 20 mg via ORAL
  Filled 2014-09-13 (×4): qty 1

## 2014-09-13 MED ORDER — BRIMONIDINE TARTRATE 0.15 % OP SOLN
1.0000 [drp] | Freq: Three times a day (TID) | OPHTHALMIC | Status: DC
  Filled 2014-09-13: qty 5

## 2014-09-13 MED ORDER — ASPIRIN EC 81 MG PO TBEC
81.0000 mg | DELAYED_RELEASE_TABLET | Freq: Every day | ORAL | Status: DC
  Administered 2014-09-14 – 2014-09-17 (×4): 81 mg via ORAL
  Filled 2014-09-13 (×5): qty 1

## 2014-09-13 MED ORDER — GABAPENTIN 300 MG PO CAPS
300.0000 mg | ORAL_CAPSULE | Freq: Three times a day (TID) | ORAL | Status: DC
  Administered 2014-09-13 – 2014-09-17 (×11): 300 mg via ORAL
  Filled 2014-09-13 (×12): qty 1

## 2014-09-13 MED ORDER — BRIMONIDINE TARTRATE 0.2 % OP SOLN
1.0000 [drp] | Freq: Three times a day (TID) | OPHTHALMIC | Status: DC
  Administered 2014-09-14 (×2): 1 [drp] via OPHTHALMIC
  Filled 2014-09-13: qty 5

## 2014-09-13 MED ORDER — PANTOPRAZOLE SODIUM 20 MG PO TBEC
20.0000 mg | DELAYED_RELEASE_TABLET | Freq: Every day | ORAL | Status: DC
  Administered 2014-09-14 – 2014-09-17 (×4): 20 mg via ORAL
  Filled 2014-09-13 (×5): qty 1

## 2014-09-13 MED ORDER — SODIUM CHLORIDE 0.9 % IV SOLN: 250.0000 mL | INTRAVENOUS | Status: DC | PRN

## 2014-09-13 MED ORDER — ONDANSETRON HCL 4 MG/2ML IJ SOLN: 4.0000 mg | Freq: Four times a day (QID) | INTRAMUSCULAR | Status: DC | PRN

## 2014-09-13 MED ORDER — ALBUTEROL SULFATE (2.5 MG/3ML) 0.083% IN NEBU: 2.5000 mg | INHALATION_SOLUTION | Freq: Four times a day (QID) | RESPIRATORY_TRACT | Status: DC | PRN

## 2014-09-13 MED ORDER — POTASSIUM CHLORIDE CRYS ER 20 MEQ PO TBCR
40.0000 meq | EXTENDED_RELEASE_TABLET | Freq: Once | ORAL | Status: AC
  Administered 2014-09-13: 40 meq via ORAL
  Filled 2014-09-13: qty 2

## 2014-09-13 MED ORDER — SODIUM CHLORIDE 0.9 % IJ SOLN: 3.0000 mL | INTRAMUSCULAR | Status: DC | PRN

## 2014-09-13 NOTE — ED Notes (Signed)
Pt here from home with c/o chest pain and hypoglycemia , pt was unresponsive on EMS arrival , pt cbg 50 1 amp d 50 given pt responded , awoke with chest pain , pt has a known blockage that could not be stented

## 2014-09-13 NOTE — ED Provider Notes (Signed)
CSN: [id number]     Arrival date & time 09/13/14  1544 History   First MD Initiated Contact with Patient 09/13/14 1548     Chief Complaint  Patient presents with  . Chest Pain  . Hypoglycemia     (Consider location/radiation/quality/duration/timing/severity/associated sxs/prior Treatment) Patient is a 81 y.o. male presenting with chest pain and hypoglycemia.  Chest Pain Pain location:  L chest Pain quality: pressure   Pain radiates to:  L jaw and neck Pain radiates to the back: no   Pain severity:  Severe Onset quality:  Unable to specify Duration:  1 hour Timing:  Constant Progression:  Unchanged Chronicity:  Recurrent Context: at rest   Relieved by:  Nothing Worsened by:  Nothing tried Ineffective treatments:  Nitroglycerin Associated symptoms: altered mental status and diaphoresis   Associated symptoms: no abdominal pain, no fever, no headache, no nausea, no shortness of breath and not vomiting   Risk factors: coronary artery disease and diabetes mellitus   Hypoglycemia Initial blood sugar:  50 Severity:  Severe Onset quality:  Unable to specify Timing:  Unable to specify Progression:  Resolved Chronicity:  Recurrent Diabetic status:  Controlled with insulin Context: not decreased oral intake   Context comment:  Increase in insulin Relieved by:  IV glucose Ineffective treatments:  None tried Associated symptoms: altered mental status and sweats   Associated symptoms: no shortness of breath and no vomiting     Past Medical History  Diagnosis Date  . CAD in native artery 1995    CABG x 3 - LIMA-LAD, SVG-OM2, SVG-rPDA  . CAD (coronary artery disease) of artery bypass graft 2000    PCI x 2 - ostial & prox-mid LAD (BMS) for atretic LIMA-LAD  . S/P CABG x 3 1995   . S/P Redo CABG x 1 2001    L Radial-LAD after 2 failed attempts @ LIMA-LAD PTCA; LAD stents 100% occluded  . CAD (coronary artery disease)     1995 - CABG x3; redo in CABG x 1 2001 Free Radial to LAD;  All grafts patent by Cath 08/2011  . Hypertension, essential   . Hyperlipidemia LDL goal <70   . DM (diabetes mellitus) type II controlled peripheral vascular disorder 2002    Left subclavian and left vertebral artery stenoses  . Asymptomatic stenosis of left vertebral artery 2002, 2005    Status post PTA/stent with redo; normal antegrade flow on dopplers 09/2013  . Stenosis of left subclavian artery 11/2003    Status post PTA/stent --> < 50 % stenosis by Dopplers 09/2013  . Glaucoma    Past Surgical History  Procedure Laterality Date  . Coronary artery bypass graft  1995     LIMA-LAD, SVG-OM2, SVG-rPDA  . Coronary stent placement  1995-2000    2 BMS stents to osital-proximal & proximal-mid LAD; because of atretic LIMA-LDA  . Coronary angioplasty  April and May 2001    After Both LAD stents occluded - PTCA of anastomatic LIMA-LAD lesion -- Unsuccessful.  . Coronary artery bypass graft  June 2001    Dr. Lucianne Lei Trigt: Redo LAD grafting with free Left Radial-distal LAD  . Shoulder open rotator cuff repair  October 2004    Dr. Noemi Chapel  . Sp extracran vert or thor carotid stent Left October 2002; November 2005    Left Vertebral stent placed in October 2002 (Dr. Patrecia Pour); redo PCI in 2005  . Subclavian artery stent Left November 2005    Dr. Gwenlyn Found  . Cardiac catheterization  November 2006; August 13    Known occluded LIMA-LAD and ostial LAD. Moderate to severe proximal circumflex and RCA disease. Widely patent freeLRAD-dLAD, as well as SVG-RPDA (backfilling RPL), SVG-OM 2 (backfilling OM1)  . Left heart catheterization with coronary/graft angiogram N/A 08/22/2011    Procedure: LEFT HEART CATHETERIZATION WITH Beatrix Fetters;  Surgeon: Leonie Man, MD;  Location: [facility] CATH LAB;  Service: Cardiovascular;  Laterality: N/A;  . Cardiac catheterization N/A 08/04/2014    Procedure: Left Heart Cath and Cors/Grafts Angiography;  Surgeon: Leonie Man, MD;  Location: [place] CV LAB;   Service: Cardiovascular;  Laterality: N/A;   History reviewed. No pertinent family history. Social History  Substance Use Topics  . Smoking status: Former Smoker -- 3.00 packs/day for 30 years    Types: Cigarettes  . Smokeless tobacco: None     Comment: quit smoking about 50 years ago  . Alcohol Use: No    Review of Systems  Constitutional: Positive for diaphoresis. Negative for fever and chills.  HENT: Negative for congestion and sore throat.   Eyes: Negative for visual disturbance.  Respiratory: Negative for shortness of breath and wheezing.   Cardiovascular: Positive for chest pain.  Gastrointestinal: Negative for nausea, vomiting, abdominal pain, diarrhea and constipation.  Genitourinary: Negative for dysuria and difficulty urinating.  Musculoskeletal: Negative for myalgias and arthralgias.  Skin: Negative for wound.  Neurological: Positive for syncope. Negative for headaches.  Psychiatric/Behavioral: Negative for behavioral problems.  All other systems reviewed and are negative.     Allergies  Iohexol; Contrast media; and Metformin and related  Home Medications   Prior to Admission medications   Medication Sig Start Date End Date Taking? Authorizing Provider  acetaminophen (TYLENOL) 500 MG tablet Take 1,000 mg by mouth every 6 (six) hours as needed for mild pain or headache.   Yes Historical Provider, MD  Ascorbic Acid (VITAMIN C) 1000 MG tablet Take 1,000 mg by mouth daily.   Yes Historical Provider, MD  aspirin EC 81 MG tablet Take 81 mg by mouth daily.   Yes Historical Provider, MD  brimonidine (ALPHAGAN P) 0.1 % SOLN Place 1 drop into the right eye 3 (three) times daily.    Yes Historical Provider, MD  Cholecalciferol (VITAMIN D-3) 1000 UNITS CAPS Take 1,000 Units by mouth daily.   Yes Historical Provider, MD  clopidogrel (PLAVIX) 75 MG tablet Take 1 tablet (75 mg total) by mouth daily. 10/24/12  Yes Leonie Man, MD  doxazosin (CARDURA) 4 MG tablet Take 2 mg by  mouth at bedtime.    Yes Historical Provider, MD  gabapentin (NEURONTIN) 300 MG capsule Take 300 mg by mouth 3 (three) times daily.   Yes Historical Provider, MD  glipiZIDE (GLUCOTROL XL) 10 MG 24 hr tablet Take 10 mg by mouth 2 (two) times daily. 09/02/14  Yes Historical Provider, MD  insulin aspart protamine- aspart (NOVOLOG MIX 70/30) (70-30) 100 UNIT/ML injection Inject 30-36 Units into the skin 2 (two) times daily with a meal. 36 units every morning and 30 units every evening   Yes Historical Provider, MD  isosorbide mononitrate (IMDUR) 60 MG 24 hr tablet Take 1.5 tablets (90 mg total) by mouth daily. 09/01/14  Yes Brittainy Erie Noe, PA-C  lansoprazole (PREVACID) 15 MG capsule Take 15 mg by mouth daily at 12 noon.   Yes Historical Provider, MD  latanoprost (XALATAN) 0.005 % ophthalmic solution Place 1 drop into both eyes at bedtime.    Yes Historical Provider, MD  lisinopril (PRINIVIL,ZESTRIL)  40 MG tablet Take 0.5 tablets (20 mg total) by mouth daily. 08/04/14  Yes Shanker Kristeen Mans, MD  loratadine (CLARITIN) 10 MG tablet Take 10 mg by mouth daily as needed. Forb  Seasonal allergies   Yes Historical Provider, MD  Omega-3 Fatty Acids (FISH OIL) 300 MG CAPS Take 300 mg by mouth 2 (two) times daily.    Yes Historical Provider, MD  saccharomyces boulardii (FLORASTOR) 250 MG capsule Take 1 capsule (250 mg total) by mouth 2 (two) times daily. Patient taking differently: Take 250 mg by mouth daily.  08/04/14  Yes Shanker Kristeen Mans, MD  saw palmetto 160 MG capsule Take 480 mg by mouth daily.   Yes Historical Provider, MD  simvastatin (ZOCOR) 80 MG tablet Take 40 mg by mouth daily at 6 PM.   Yes Historical Provider, MD  timolol (TIMOPTIC) 0.5 % ophthalmic solution Place 1 drop into the left eye 3 (three) times daily.    Yes Historical Provider, MD  atorvastatin (LIPITOR) 40 MG tablet Take 1 tablet (40 mg total) by mouth daily at 6 PM. Patient not taking: Reported on 09/13/2014 08/04/14   Jonetta Osgood, MD   glipiZIDE (GLUCOTROL XL) 5 MG 24 hr tablet Take 1 tablet (5 mg total) by mouth daily with breakfast. Patient not taking: Reported on 09/13/2014 08/04/14   Shanker Kristeen Mans, MD   BP 114/50 mmHg  Pulse 52  Temp(Src) 95.2 F (35.1 C) (Oral)  Resp 13  SpO2 93% Physical Exam  Constitutional: He is oriented to person, place, and time. He appears well-developed and well-nourished.  HENT:  Head: Normocephalic and atraumatic.  Eyes: EOM are normal.  Neck: Normal range of motion.  Cardiovascular: Normal rate, regular rhythm and normal heart sounds.   No murmur heard. Pulmonary/Chest: Effort normal and breath sounds normal. No respiratory distress.  Abdominal: Soft. There is no tenderness.  Musculoskeletal: He exhibits no edema.  Neurological: He is alert and oriented to person, place, and time. He has normal strength. No cranial nerve deficit or sensory deficit. Coordination normal. GCS eye subscore is 4. GCS verbal subscore is 5. GCS motor subscore is 6.  Skin: No rash noted. He is not diaphoretic.    ED Course  Procedures (including critical care time) Labs Review Labs Reviewed  CBC WITH DIFFERENTIAL/PLATELET - Abnormal; Notable for the following:    WBC 11.7 (*)    RBC 3.65 (*)    Hemoglobin 11.1 (*)    HCT 31.8 (*)    Neutrophils Relative % 88 (*)    Neutro Abs 10.4 (*)    Lymphocytes Relative 8 (*)    All other components within normal limits  COMPREHENSIVE METABOLIC PANEL - Abnormal; Notable for the following:    Potassium 3.3 (*)    BUN 23 (*)    Total Protein 6.1 (*)    Alkaline Phosphatase 30 (*)    GFR calc non Af Amer 56 (*)    All other components within normal limits  CBG MONITORING, ED - Abnormal; Notable for the following:    Glucose-Capillary 113 (*)    All other components within normal limits  I-STAT CG4 LACTIC ACID, ED - Abnormal; Notable for the following:    Lactic Acid, Venous 2.41 (*)    All other components within normal limits  CBG MONITORING, ED -  Abnormal; Notable for the following:    Glucose-Capillary 52 (*)    All other components within normal limits  CBG MONITORING, ED - Abnormal; Notable for the  following:    Glucose-Capillary 140 (*)    All other components within normal limits  I-STAT TROPOININ, ED    Imaging Review Dg Chest Portable 1 View  09/13/2014   CLINICAL DATA:  Chest pain and hypoglycemia. Initially unresponsive.  EXAM: PORTABLE CHEST - 1 VIEW  COMPARISON:  08/02/2014.  FINDINGS: The heart size and mediastinal contours are within normal limits. Both lungs are clear. The visualized skeletal structures are unremarkable. Prior CABG. LEFT subclavian vascular stent appears stable.  IMPRESSION: No active disease.  Stable chest.   Electronically Signed   By: Staci Righter M.D.   On: 09/13/2014 17:29   I have personally reviewed and evaluated these images and lab results as part of my medical decision-making.   EKG Interpretation None      MDM   Final diagnoses:  Hypoglycemia     Patient is a 81 year old male with a history of diabetes on insulin, coronary artery disease status post CABG 3 with revision that presents with unresponsiveness, hypoglycemia, chest pain. EMS arrived and the patient had a blood sugar of 50 was unresponsive received D50 and became responsive. When the patient awoke he had left-sided chest pain radiating to his left neck and was diaphoretic. Patient was given nitroglycerin and aspirin. Patient states he recently had a increase in his insulin. Patient has had no decrease in by mouth and no infectious symptoms. On arrival to the ED the patient is alert and oriented with a normal blood sugar. Patient currently is complaining of headache and chest pain. On exam patient's heart and lungs are normal and has a normal neurologic exam. Patient's EKG is very concerning for an anterior STEMI, code STEMI was called. Cardiology reveiwed EKG and feel not STEMI. Pt found to have leukocytosis and lactic acidosis.  Patient's troponin is undetectable. Patient to be admitted for further management and care.    Renne Musca, MD 09/13/14 1830  Leonard Schwartz, MD 09/22/14 [phone]

## 2014-09-13 NOTE — ED Notes (Signed)
EDP at bedside  

## 2014-09-13 NOTE — H&P (Signed)
Called to obtain report for patient, however, the nurse Cricket

## 2014-09-13 NOTE — ED Notes (Signed)
cbg 95, reported to Dr. Audie Pinto and Dr. Robina Ade.  Pt. Is having no symptoms of hypoglycemia

## 2014-09-13 NOTE — ED Notes (Signed)
Carb modified dinner tray ordered 

## 2014-09-13 NOTE — H&P (Signed)
[facility] History and Physical  [name] RSW:[id number] DOB: August 01, 1932 DOA: 09/13/2014   PCP: Rory Percy, MD  Specialists: Dr. Ellyn Hack is his cardiologist  Chief Complaint: Low blood sugar and chest pain  HPI: [name] is a 81 y.o. male with a past medical history of diabetes, on insulin, coronary artery disease, chronic angina, hyperlipidemia, who was hospitalized in early August with the chest pain. He underwent cardiac catheterization at that time. He was found to have diffuse CAD disease, none of which was amenable to intervention. His dose of Imdur was increased. He cannot afford Ranexa. Patient was seen recently by cardiology office and the dose of Imdur was further increased to 90 mg. He was in his usual state of health till this morning at 7:00 when he woke up. He had breakfast. He checked his blood sugar which was 82. He gave himself his insulin injection. He tells me that on Thursday he was seen by his providers at the [place] and they increased the night dose of his insulin to 30 units from 25. And then he went on to mow his lawn. He felt weak subsequently. He came inside to lie down on the bed. His daughter checked on him a few minutes later and he was doing well. She checked on him after little bit longer and she found him to be moaning. He wasn't responding. He was cold to touch. She called 911. Blood sugar was 52 when checked by EMS. She was given D50. He was given something to eat. He subsequently started complaining of chest pain and neck pain and left arm pain. He was subsequently brought into the hospital. CBG en route was in the 200s. In the emergency department, patient was evaluated. He again lost consciousness with a blood sugar in the 50s. Subsequently, we were called for evaluation. Currently, he still has minimal left-sided chest pain. But this is mostly his chronic pain. Denies any shortness of breath. Denies any weakness in any one side of his body. No  fever, no chills recently.  Home Medications: Prior to Admission medications   Medication Sig Start Date End Date Taking? Authorizing Provider  aspirin EC 81 MG tablet Take 81 mg by mouth daily.   Yes Historical Provider, MD  clopidogrel (PLAVIX) 75 MG tablet Take 1 tablet (75 mg total) by mouth daily. 10/24/12  Yes Leonie Man, MD  gabapentin (NEURONTIN) 300 MG capsule Take 300 mg by mouth 3 (three) times daily.   Yes Historical Provider, MD  acetaminophen (TYLENOL) 500 MG tablet Take 1,000 mg by mouth every 6 (six) hours as needed for mild pain or headache.    Historical Provider, MD  Ascorbic Acid (VITAMIN C) 1000 MG tablet Take 1,000 mg by mouth daily.    Historical Provider, MD  atorvastatin (LIPITOR) 40 MG tablet Take 1 tablet (40 mg total) by mouth daily at 6 PM. 08/04/14   Jonetta Osgood, MD  brimonidine (ALPHAGAN P) 0.1 % SOLN Place 1 drop into the right eye 3 (three) times daily.     Historical Provider, MD  Cholecalciferol (VITAMIN D-3) 1000 UNITS CAPS Take 1,000 Units by mouth daily.    Historical Provider, MD  doxazosin (CARDURA) 4 MG tablet Take 2 mg by mouth at bedtime.     Historical Provider, MD  glipiZIDE (GLUCOTROL XL) 10 MG 24 hr tablet Take 10 mg by mouth 2 (two) times daily. 09/02/14   Historical Provider, MD  glipiZIDE (GLUCOTROL XL) 5 MG 24 hr  tablet Take 1 tablet (5 mg total) by mouth daily with breakfast. 08/04/14   Jonetta Osgood, MD  isosorbide mononitrate (IMDUR) 60 MG 24 hr tablet Take 1.5 tablets (90 mg total) by mouth daily. 09/01/14   Brittainy Erie Noe, PA-C  latanoprost (XALATAN) 0.005 % ophthalmic solution Place 1 drop into both eyes at bedtime.     Historical Provider, MD  lisinopril (PRINIVIL,ZESTRIL) 40 MG tablet Take 0.5 tablets (20 mg total) by mouth daily. 08/04/14   Shanker Kristeen Mans, MD  loratadine (CLARITIN) 10 MG tablet Take 10 mg by mouth daily as needed. Forb  Seasonal allergies    Historical Provider, MD  Omega-3 Fatty Acids (FISH OIL) 300 MG  CAPS Take 300 mg by mouth daily.    Historical Provider, MD  omeprazole (PRILOSEC) 20 MG capsule Take 20 mg by mouth daily.    Historical Provider, MD  saccharomyces boulardii (FLORASTOR) 250 MG capsule Take 1 capsule (250 mg total) by mouth 2 (two) times daily. 08/04/14   Shanker Kristeen Mans, MD  timolol (TIMOPTIC) 0.5 % ophthalmic solution Place 1 drop into the left eye 3 (three) times daily.     Historical Provider, MD    Allergies:  Allergies  Allergen Reactions  . Iohexol Other (See Comments)    Intractable shaking.  . Contrast Media [Iodinated Diagnostic Agents]     Shivering & shaking. Pt reports takes antihistamine prior to procedures.  . Metformin And Related Diarrhea    Subsequently discontinued    Past Medical History: Past Medical History  Diagnosis Date  . CAD in native artery 1995    CABG x 3 - LIMA-LAD, SVG-OM2, SVG-rPDA  . CAD (coronary artery disease) of artery bypass graft 2000    PCI x 2 - ostial & prox-mid LAD (BMS) for atretic LIMA-LAD  . S/P CABG x 3 1995   . S/P Redo CABG x 1 2001    L Radial-LAD after 2 failed attempts @ LIMA-LAD PTCA; LAD stents 100% occluded  . CAD (coronary artery disease)     1995 - CABG x3; redo in CABG x 1 2001 Free Radial to LAD; All grafts patent by Cath 08/2011  . Hypertension, essential   . Hyperlipidemia LDL goal <70   . DM (diabetes mellitus) type II controlled peripheral vascular disorder 2002    Left subclavian and left vertebral artery stenoses  . Asymptomatic stenosis of left vertebral artery 2002, 2005    Status post PTA/stent with redo; normal antegrade flow on dopplers 09/2013  . Stenosis of left subclavian artery 11/2003    Status post PTA/stent --> < 50 % stenosis by Dopplers 09/2013  . Glaucoma     Past Surgical History  Procedure Laterality Date  . Coronary artery bypass graft  1995     LIMA-LAD, SVG-OM2, SVG-rPDA  . Coronary stent placement  1995-2000    2 BMS stents to osital-proximal & proximal-mid LAD; because  of atretic LIMA-LDA  . Coronary angioplasty  April and May 2001    After Both LAD stents occluded - PTCA of anastomatic LIMA-LAD lesion -- Unsuccessful.  . Coronary artery bypass graft  June 2001    Dr. Lucianne Lei Trigt: Redo LAD grafting with free Left Radial-distal LAD  . Shoulder open rotator cuff repair  October 2004    Dr. Noemi Chapel  . Sp extracran vert or thor carotid stent Left October 2002; November 2005    Left Vertebral stent placed in October 2002 (Dr. Patrecia Pour); redo PCI in 2005  . Subclavian  artery stent Left November 2005    Dr. Gwenlyn Found  . Cardiac catheterization  November 2006; August 13    Known occluded LIMA-LAD and ostial LAD. Moderate to severe proximal circumflex and RCA disease. Widely patent freeLRAD-dLAD, as well as SVG-RPDA (backfilling RPL), SVG-OM 2 (backfilling OM1)  . Left heart catheterization with coronary/graft angiogram N/A 08/22/2011    Procedure: LEFT HEART CATHETERIZATION WITH Beatrix Fetters;  Surgeon: Leonie Man, MD;  Location: [facility] CATH LAB;  Service: Cardiovascular;  Laterality: N/A;  . Cardiac catheterization N/A 08/04/2014    Procedure: Left Heart Cath and Cors/Grafts Angiography;  Surgeon: Leonie Man, MD;  Location: [place] CV LAB;  Service: Cardiovascular;  Laterality: N/A;    Social History: Lives with his wife.  Social History   Social History  . Marital Status: Married    Spouse Name: N/A  . Number of Children: N/A  . Years of Education: N/A   Occupational History  . Not on file.   Social History Main Topics  . Smoking status: Former Smoker -- 3.00 packs/day for 30 years    Types: Cigarettes  . Smokeless tobacco: Not on file     Comment: quit smoking about 50 years ago  . Alcohol Use: No  . Drug Use: No  . Sexual Activity: Not on file   Other Topics Concern  . Not on file   Social History Narrative   He is married with one daughter. He has 2 grandchildren.   He does not smoke. He quit smoking in roughly 1970 after  smoking 3 packs per day.   He is routinely at give him. It does not necessarily do routine exercise.     Family History: mentions a history of diabetes and heart disease in the family. Was not more specific.  Review of Systems - History obtained from the patient General ROS: positive for  - fatigue Psychological ROS: positive for - anxiety Ophthalmic ROS: negative ENT ROS: negative Allergy and Immunology ROS: negative Hematological and Lymphatic ROS: negative Endocrine ROS: negative Respiratory ROS: no cough, shortness of breath, or wheezing Cardiovascular ROS: as in hpi Gastrointestinal ROS: no abdominal pain, change in bowel habits, or black or bloody stools Genito-Urinary ROS: no dysuria, trouble voiding, or hematuria Musculoskeletal ROS: negative Neurological ROS: no TIA or stroke symptoms Dermatological ROS: negative  Physical Examination  Filed Vitals:   09/13/14 1645 09/13/14 1700 09/13/14 1715 09/13/14 1730  BP: 121/46 132/40 140/48 137/45  Pulse: 57 68 72 72  Temp:      TempSrc:      Resp: 10 22 25 17   SpO2: 98% 96% 95% 97%    BP 137/45 mmHg  Pulse 72  Temp(Src) 95.2 F (35.1 C) (Oral)  Resp 17  SpO2 97%  General appearance: alert, cooperative, appears stated age and no distress Head: Normocephalic, without obvious abnormality, atraumatic Eyes: conjunctivae/corneas clear. PERRL, EOM's intact.  Throat: lips, mucosa, and tongue normal; teeth and gums normal Neck: no adenopathy, no carotid bruit, no JVD, supple, symmetrical, trachea midline and thyroid not enlarged, symmetric, no tenderness/mass/nodules Resp: clear to auscultation bilaterally Cardio: S1, S2 is normal, regular. Systolic murmur appreciated over the precordium. No S3, S4. No rubs, or bruit. No pedal edema. GI: soft, non-tender; bowel sounds normal; no masses,  no organomegaly Extremities: extremities normal, atraumatic, no cyanosis or edema Pulses: 2+ and symmetric Skin: Skin color, texture,  turgor normal. No rashes or lesions Lymph nodes: Cervical, supraclavicular, and axillary nodes normal. Neurologic: Alert and oriented 3.  Cranial nerves 2-12 intact. Motor strength equal bilateral upper and lower extremity. No pronator drift. Gait not assessed  Laboratory Data: Results for orders placed or performed during the hospital encounter of 09/13/14 (from the past 48 hour(s))  CBG monitoring, ED     Status: Abnormal   Collection Time: 09/13/14  3:47 PM  Result Value Ref Range   Glucose-Capillary 113 (H) 65 - 99 mg/dL  CBC with Differential/Platelet     Status: Abnormal   Collection Time: 09/13/14  4:19 PM  Result Value Ref Range   WBC 11.7 (H) 4.0 - 10.5 K/uL   RBC 3.65 (L) 4.22 - 5.81 MIL/uL   Hemoglobin 11.1 (L) 13.0 - 17.0 g/dL   HCT 31.8 (L) 39.0 - 52.0 %   MCV 87.1 78.0 - 100.0 fL   MCH 30.4 26.0 - 34.0 pg   MCHC 34.9 30.0 - 36.0 g/dL   RDW 12.5 11.5 - 15.5 %   Platelets 170 150 - 400 K/uL   Neutrophils Relative % 88 (H) 43 - 77 %   Neutro Abs 10.4 (H) 1.7 - 7.7 K/uL   Lymphocytes Relative 8 (L) 12 - 46 %   Lymphs Abs 0.9 0.7 - 4.0 K/uL   Monocytes Relative 4 3 - 12 %   Monocytes Absolute 0.5 0.1 - 1.0 K/uL   Eosinophils Relative 0 0 - 5 %   Eosinophils Absolute 0.0 0.0 - 0.7 K/uL   Basophils Relative 0 0 - 1 %   Basophils Absolute 0.0 0.0 - 0.1 K/uL  Comprehensive metabolic panel     Status: Abnormal   Collection Time: 09/13/14  4:19 PM  Result Value Ref Range   Sodium 135 135 - 145 mmol/L   Potassium 3.3 (L) 3.5 - 5.1 mmol/L   Chloride 102 101 - 111 mmol/L   CO2 24 22 - 32 mmol/L   Glucose, Bld 77 65 - 99 mg/dL   BUN 23 (H) 6 - 20 mg/dL   Creatinine, Ser 1.18 0.61 - 1.24 mg/dL   Calcium 9.2 8.9 - 10.3 mg/dL   Total Protein 6.1 (L) 6.5 - 8.1 g/dL   Albumin 3.6 3.5 - 5.0 g/dL   AST 24 15 - 41 U/L   ALT 21 17 - 63 U/L   Alkaline Phosphatase 30 (L) 38 - 126 U/L   Total Bilirubin 0.6 0.3 - 1.2 mg/dL   GFR calc non Af Amer 56 (L) >60 mL/min   GFR calc Af  Amer >60 >60 mL/min    Comment: (NOTE) The eGFR has been calculated using the CKD EPI equation. This calculation has not been validated in all clinical situations. eGFR's persistently <60 mL/min signify possible Chronic Kidney Disease.    Anion gap 9 5 - 15  I-stat troponin, ED     Status: None   Collection Time: 09/13/14  4:24 PM  Result Value Ref Range   Troponin i, poc 0.00 0.00 - 0.08 ng/mL   Comment 3            Comment: Due to the release kinetics of cTnI, a negative result within the first hours of the onset of symptoms does not rule out myocardial infarction with certainty. If myocardial infarction is still suspected, repeat the test at appropriate intervals.   I-Stat CG4 Lactic Acid, ED     Status: Abnormal   Collection Time: 09/13/14  4:31 PM  Result Value Ref Range   Lactic Acid, Venous 2.41 (HH) 0.5 - 2.0 mmol/L   Comment NOTIFIED  PHYSICIAN   CBG monitoring, ED     Status: Abnormal   Collection Time: 09/13/14  5:16 PM  Result Value Ref Range   Glucose-Capillary 52 (L) 65 - 99 mg/dL   Comment 1 Call MD NNP PA CNM   CBG monitoring, ED     Status: Abnormal   Collection Time: 09/13/14  5:31 PM  Result Value Ref Range   Glucose-Capillary 140 (H) 65 - 99 mg/dL    Radiology Reports: Dg Chest Portable 1 View  09/13/2014   CLINICAL DATA:  Chest pain and hypoglycemia. Initially unresponsive.  EXAM: PORTABLE CHEST - 1 VIEW  COMPARISON:  08/02/2014.  FINDINGS: The heart size and mediastinal contours are within normal limits. Both lungs are clear. The visualized skeletal structures are unremarkable. Prior CABG. LEFT subclavian vascular stent appears stable.  IMPRESSION: No active disease.  Stable chest.   Electronically Signed   By: Staci Righter M.D.   On: 09/13/2014 17:29    My interpretation of Electrocardiogram: Sinus rhythm in the 50s. Normal axis. Intervals are normal. ST elevation noted in the inferior leads as well as V3 to V6. Similar to previous EKG.  Problem  List  Principal Problem:   Hypoglycemia Active Problems:   Hyperlipidemia with target LDL less than 70   Essential hypertension   DM2 (diabetes mellitus, type 2) - with CAD   CAD -> CABG x3 then Re-DO CABG x1 (LRad-dLAD) after atretic LIMA & LAD stent occlusion.   S/P CABG (coronary artery bypass graft): Initial CABGX3 1995 (LIMA-LAD, SVG-OM 2, SVG-RPDA) --> redo CABG x1 FreeLRad-LAD for a occluded LAD with atretic LIMA and failed attempted revascularization.   Angina at rest   Assessment: This is a 81 year old Caucasian male with a past medical history as stated earlier, who presents after hypoglycemic episode at home. Dose of his nighttime insulin was increased recently. This likely accounts for his presentation. Chest pain was most likely due to his acute illness. He has chronic angina. He has coronary artery disease not amenable to intervention. He subsequently had another episode of hypoglycemia in the emergency department. He lost consciousness. He warrants inpatient hospitalization.  Plan: #1 hypoglycemia in the setting of known type 2 diabetes. Most likely secondary to increased dose of insulin that he was asked to take recently by his [place] providers. He will be monitored closely in step down unit because he lost consciousness. CBGs will be checked every 1 hour for now. When the blood sugar stabilizes this can be checked less frequently. We will hold all of his glucose lowering drugs. Last HbA1c was 6.5 in July. It likely that he is being aggressively treated. He is an elderly male and does not need to be so aggressively treated for his diabetes. We will repeat another HbA1c. Do not suspect any other etiology such as sepsis as causing his low blood sugar. Lactic acid is noted to be mildly elevated.  #2 chest pain in the setting of known CAD: He has lesions that are not amenable to intervention. Continue with his imdur at current dose. Cycle troponins. Do not anticipate any further workup in  the hospital.  #3 coronary artery disease: Continue medical management. Continue aspirin, Plavix, ACE inhibitor. Not on a beta blocker due to history of significant bradycardia.  #4 Diarrhea. During previous hospitalization: Resolved. He blames it on metformin.  #5 history of essential hypertension: Blood pressure is stable and reasonably well controlled. Continue current management.  #6 history of dyslipidemia: Continue statin  #7 History of  peripheral vascular disease: Stable. Continue antiplatelets agents and medical management.  #8 History of glaucoma: Continue with his eyedrops. Noted to be on Timolol eyedrops which can worsen his bradycardia. Hold for now.   DVT Prophylaxis: Lovenox Code Status: Full code Family Communication: Discussed with the patient, his wife and daughter  Disposition Plan: Admit to stepdown   Further management decisions will depend on results of further testing and patient's response to treatment.   [facility]  [facility] Pager [phone]  If 7PM-7AM, please contact night-coverage [email or web address] Password [facility]  09/13/2014, 6:16 PM

## 2014-09-14 LAB — CBC
HEMATOCRIT: 35.5 % — AB (ref 39.0–52.0)
Hemoglobin: 12.4 g/dL — ABNORMAL LOW (ref 13.0–17.0)
MCH: 30.6 pg (ref 26.0–34.0)
MCHC: 34.9 g/dL (ref 30.0–36.0)
MCV: 87.7 fL (ref 78.0–100.0)
PLATELETS: 211 10*3/uL (ref 150–400)
RBC: 4.05 MIL/uL — AB (ref 4.22–5.81)
RDW: 12.8 % (ref 11.5–15.5)
WBC: 9.6 10*3/uL (ref 4.0–10.5)

## 2014-09-14 LAB — GLUCOSE, CAPILLARY
GLUCOSE-CAPILLARY: 102 mg/dL — AB (ref 65–99)
GLUCOSE-CAPILLARY: 130 mg/dL — AB (ref 65–99)
GLUCOSE-CAPILLARY: 141 mg/dL — AB (ref 65–99)
GLUCOSE-CAPILLARY: 198 mg/dL — AB (ref 65–99)
Glucose-Capillary: 61 mg/dL — ABNORMAL LOW (ref 65–99)
Glucose-Capillary: 116 mg/dL — ABNORMAL HIGH (ref 65–99)
Glucose-Capillary: 47 mg/dL — ABNORMAL LOW (ref 65–99)
Glucose-Capillary: 85 mg/dL (ref 65–99)
Glucose-Capillary: 177 mg/dL — ABNORMAL HIGH (ref 65–99)
Glucose-Capillary: 42 mg/dL — CL (ref 65–99)
Glucose-Capillary: 208 mg/dL — ABNORMAL HIGH (ref 65–99)
Glucose-Capillary: 273 mg/dL — ABNORMAL HIGH (ref 65–99)
Glucose-Capillary: 209 mg/dL — ABNORMAL HIGH (ref 65–99)
Glucose-Capillary: 157 mg/dL — ABNORMAL HIGH (ref 65–99)
Glucose-Capillary: 193 mg/dL — ABNORMAL HIGH (ref 65–99)

## 2014-09-14 LAB — BASIC METABOLIC PANEL
Anion gap: 8 (ref 5–15)
BUN: 19 mg/dL (ref 6–20)
CHLORIDE: 105 mmol/L (ref 101–111)
CO2: 26 mmol/L (ref 22–32)
Calcium: 9.6 mg/dL (ref 8.9–10.3)
Creatinine, Ser: 1.04 mg/dL (ref 0.61–1.24)
Glucose, Bld: 49 mg/dL — ABNORMAL LOW (ref 65–99)
POTASSIUM: 4 mmol/L (ref 3.5–5.1)
SODIUM: 139 mmol/L (ref 135–145)

## 2014-09-14 LAB — TROPONIN I
TROPONIN I: 0.04 ng/mL — AB (ref <0.031)
Troponin I: 0.04 ng/mL — ABNORMAL HIGH (ref <0.031)
Troponin I: 0.05 ng/mL — ABNORMAL HIGH (ref <0.031)

## 2014-09-14 MED ORDER — BRIMONIDINE TARTRATE 0.2 % OP SOLN
1.0000 [drp] | Freq: Three times a day (TID) | OPHTHALMIC | Status: DC
  Administered 2014-09-14 – 2014-09-17 (×8): 1 [drp] via OPHTHALMIC
  Filled 2014-09-14: qty 5

## 2014-09-14 MED ORDER — DEXTROSE 50 % IV SOLN
INTRAVENOUS | Status: AC
  Filled 2014-09-14: qty 50

## 2014-09-14 MED ORDER — DEXTROSE 50 % IV SOLN
25.0000 mL | Freq: Once | INTRAVENOUS | Status: AC
  Administered 2014-09-14: 25 mL via INTRAVENOUS

## 2014-09-14 MED ORDER — DEXTROSE-NACL 5-0.9 % IV SOLN
INTRAVENOUS | Status: DC
  Administered 2014-09-14: 500 mL via INTRAVENOUS

## 2014-09-14 MED ORDER — LATANOPROST 0.005 % OP SOLN
1.0000 [drp] | Freq: Every day | OPHTHALMIC | Status: DC
  Administered 2014-09-14 – 2014-09-16 (×3): 1 [drp] via OPHTHALMIC
  Filled 2014-09-14: qty 2.5

## 2014-09-14 MED ORDER — DEXTROSE 50 % IV SOLN
50.0000 mL | Freq: Once | INTRAVENOUS | Status: AC
  Administered 2014-09-14: 50 mL via INTRAVENOUS

## 2014-09-14 MED ORDER — DEXTROSE 50 % IV SOLN
INTRAVENOUS | Status: AC
  Administered 2014-09-14: 50 mL via INTRAVENOUS
  Filled 2014-09-14: qty 50

## 2014-09-14 MED ORDER — DEXTROSE 10 % IV SOLN
INTRAVENOUS | Status: DC
  Administered 2014-09-14: 09:00:00 via INTRAVENOUS

## 2014-09-14 NOTE — Progress Notes (Signed)
[facility] PROGRESS NOTE  [name] [id number] DOB: April 07, 1932 DOA: 09/13/2014  PCP: Rory Percy, MD  Brief HPI: 81 year old Caucasian male with a past medical history of diabetes on insulin, coronary artery disease, chronic angina, was recently hospitalized in early August for chest pain. He underwent cardiac catheterization. Medical management was recommended. He presented to the emergency department yesterday with episode of hypoglycemia after recent increase in the dose of his insulin. He was persistently hypoglycemic and was hospitalized for further management.  Past medical history:  Past Medical History  Diagnosis Date  . CAD in native artery 1995    CABG x 3 - LIMA-LAD, SVG-OM2, SVG-rPDA  . CAD (coronary artery disease) of artery bypass graft 2000    PCI x 2 - ostial & prox-mid LAD (BMS) for atretic LIMA-LAD  . S/P CABG x 3 1995   . S/P Redo CABG x 1 2001    L Radial-LAD after 2 failed attempts @ LIMA-LAD PTCA; LAD stents 100% occluded  . CAD (coronary artery disease)     1995 - CABG x3; redo in CABG x 1 2001 Free Radial to LAD; All grafts patent by Cath 08/2011  . Hypertension, essential   . Hyperlipidemia LDL goal <70   . DM (diabetes mellitus) type II controlled peripheral vascular disorder 2002    Left subclavian and left vertebral artery stenoses  . Asymptomatic stenosis of left vertebral artery 2002, 2005    Status post PTA/stent with redo; normal antegrade flow on dopplers 09/2013  . Stenosis of left subclavian artery 11/2003    Status post PTA/stent --> < 50 % stenosis by Dopplers 09/2013  . Glaucoma     Consultants: None  Procedures: None  Antibiotics: None  Subjective: Patient continues to feel fatigued today. On and off chest pain is present. Denies any nausea, vomiting. No diarrhea. No shortness of breath.  Objective: Vital Signs  Filed Vitals:   09/13/14 2030 09/13/14 2111 09/14/14 0029 09/14/14 0417  BP: 119/47 142/52 123/52  154/45  Pulse: 61 64 67 75  Temp:  97.1 F (36.2 C) 97.9 F (36.6 C) 97.8 F (36.6 C)  TempSrc:  Oral Oral Oral  Resp: 19 22 26 24   Height:  5\' 7"  (1.702 m)    Weight:  59.467 kg (131 lb 1.6 oz)    SpO2: 98% 98% 97% 98%    Intake/Output Summary (Last 24 hours) at 09/14/14 0735 Last data filed at 09/14/14 0330  Gross per 24 hour  Intake      3 ml  Output    550 ml  Net   -547 ml   Filed Weights   09/13/14 2111  Weight: 59.467 kg (131 lb 1.6 oz)    General appearance: alert, cooperative, appears stated age and no distress Resp: clear to auscultation bilaterally Cardio: S1, S2 is normal, regular. Systolic murmur appreciated over the precordium. No S3, S4. No rubs, or bruit. No pedal edema. GI: soft, non-tender; bowel sounds normal; no masses,  no organomegaly Extremities: extremities normal, atraumatic, no cyanosis or edema Neurologic: Alert and oriented 3. No focal neurological deficits noted.  Lab Results:  Basic Metabolic Panel:  Recent Labs Lab 09/13/14 1619 09/14/14 0314  NA 135 139  K 3.3* 4.0  CL 102 105  CO2 24 26  GLUCOSE 77 49*  BUN 23* 19  CREATININE 1.18 1.04  CALCIUM 9.2 9.6   Liver Function Tests:  Recent Labs Lab 09/13/14 1619  AST 24  ALT 21  ALKPHOS  30*  BILITOT 0.6  PROT 6.1*  ALBUMIN 3.6   CBC:  Recent Labs Lab 09/13/14 1619 09/14/14 0314  WBC 11.7* 9.6  NEUTROABS 10.4*  --   HGB 11.1* 12.4*  HCT 31.8* 35.5*  MCV 87.1 87.7  PLT 170 211   Cardiac Enzymes:  Recent Labs Lab 09/13/14 2243 09/14/14 0314  TROPONINI 0.04* 0.05*    CBG:  Recent Labs Lab 09/14/14 0109 09/14/14 0134 09/14/14 0242 09/14/14 0312 09/14/14 0334  GLUCAP 61* 116* 85 47* 177*    Recent Results (from the past 240 hour(s))  MRSA PCR Screening     Status: None   Collection Time: 09/13/14  9:18 PM  Result Value Ref Range Status   MRSA by PCR NEGATIVE NEGATIVE Final    Comment:        The GeneXpert MRSA Assay (FDA approved for NASAL  specimens only), is one component of a comprehensive MRSA colonization surveillance program. It is not intended to diagnose MRSA infection nor to guide or monitor treatment for MRSA infections.       Studies/Results: Dg Chest Portable 1 View  09/13/2014   CLINICAL DATA:  Chest pain and hypoglycemia. Initially unresponsive.  EXAM: PORTABLE CHEST - 1 VIEW  COMPARISON:  08/02/2014.  FINDINGS: The heart size and mediastinal contours are within normal limits. Both lungs are clear. The visualized skeletal structures are unremarkable. Prior CABG. LEFT subclavian vascular stent appears stable.  IMPRESSION: No active disease.  Stable chest.   Electronically Signed   By: Staci Righter M.D.   On: 09/13/2014 17:29    Medications:  Scheduled: . aspirin EC  81 mg Oral Daily  . atorvastatin  40 mg Oral q1800  . brimonidine  1 drop Both Eyes 3 times per day  . clopidogrel  75 mg Oral Daily  . enoxaparin (LOVENOX) injection  40 mg Subcutaneous Q24H  . gabapentin  300 mg Oral TID  . isosorbide mononitrate  90 mg Oral Daily  . latanoprost  1 drop Both Eyes QHS  . lisinopril  20 mg Oral Daily  . pantoprazole  20 mg Oral Daily  . saccharomyces boulardii  250 mg Oral BID  . sodium chloride  3 mL Intravenous Q12H   Continuous: . dextrose 50 mL/hr at 09/14/14 0836   [id number] **OR** acetaminophen, albuterol, ondansetron **OR** ondansetron (ZOFRAN) IV  Assessment/Plan:  Principal Problem:   Hypoglycemia Active Problems:   Hyperlipidemia with target LDL less than 70   Essential hypertension   DM2 (diabetes mellitus, type 2) - with CAD   CAD -> CABG x3 then Re-DO CABG x1 (LRad-dLAD) after atretic LIMA & LAD stent occlusion.   S/P CABG (coronary artery bypass graft): Initial CABGX3 1995 (LIMA-LAD, SVG-OM 2, SVG-RPDA) --> redo CABG x1 FreeLRad-LAD for a occluded LAD with atretic LIMA and failed attempted revascularization.   Angina at rest    Hypoglycemia in the setting of known type 2  diabetes Has had a few more episodes of low blood sugars during the night. He was started on a D5 infusion overnight. Blood sugar early this morning was 42. We will change him to a D10 infusion. Hypoglycemia is most likely secondary to increased dose of insulin that he was asked to take recently by his [place] providers. Continue close monitoring for now. HbA1c was 6.5 in July. Repeat one is pending. Continue to hold all of his glucose lowering drugs. It likely that he is being aggressively treated. He is an elderly male and does not need to  be so aggressively treated for his diabetes. Do not suspect any other etiology such as sepsis as causing his low blood sugar. Lactic acid was mildly elevated initially. Subsequently normal.   Chest pain in the setting of known CAD He has lesions that are not amenable to intervention based on recent cardiac catheterization. Continue with his imdur at current dose. Minimal elevation in troponin is noted. Do not anticipate any further workup in the hospital.  Coronary artery disease Continue medical management. Continue aspirin, Plavix, ACE inhibitor. Not on a beta blocker due to history of significant bradycardia.  Diarrhea Had 3 loose stools overnight. C. difficile is pending. He had similar episodes during his previous hospitalization. Metformin was discontinued at that time. If C. difficile is negative use Imodium as needed.   History of essential hypertension Blood pressure is stable and reasonably well controlled. Continue current management.  History of dyslipidemia Continue statin  History of peripheral vascular disease Stable. Continue antiplatelets agents and medical management.  History of glaucoma Continue with his eyedrops. Noted to be on Timolol eyedrops which can worsen his bradycardia. Hold for now.  DVT Prophylaxis: Lovenox    Code Status: Full code  Family Communication: Discussed with the patient  Disposition Plan: Will remain in step down  for now.     LOS: 1 day   [facility] Pager [phone] 09/14/2014, 7:35 AM  If 7PM-7AM, please contact night-coverage at [email or web address], password [facility]

## 2014-09-14 NOTE — Progress Notes (Signed)
Utilization Review Completed.[name] T9/11/2014  

## 2014-09-15 DIAGNOSIS — E11649 Type 2 diabetes mellitus with hypoglycemia without coma: Principal | ICD-10-CM

## 2014-09-15 LAB — GLUCOSE, CAPILLARY
GLUCOSE-CAPILLARY: 140 mg/dL — AB (ref 65–99)
GLUCOSE-CAPILLARY: 102 mg/dL — AB (ref 65–99)
GLUCOSE-CAPILLARY: 94 mg/dL (ref 65–99)
GLUCOSE-CAPILLARY: 69 mg/dL (ref 65–99)
GLUCOSE-CAPILLARY: 116 mg/dL — AB (ref 65–99)
Glucose-Capillary: 134 mg/dL — ABNORMAL HIGH (ref 65–99)
Glucose-Capillary: 203 mg/dL — ABNORMAL HIGH (ref 65–99)
Glucose-Capillary: 104 mg/dL — ABNORMAL HIGH (ref 65–99)

## 2014-09-15 LAB — BASIC METABOLIC PANEL
Anion gap: 6 (ref 5–15)
BUN: 17 mg/dL (ref 6–20)
CHLORIDE: 105 mmol/L (ref 101–111)
CO2: 26 mmol/L (ref 22–32)
Calcium: 8.9 mg/dL (ref 8.9–10.3)
Creatinine, Ser: 1.12 mg/dL (ref 0.61–1.24)
GFR calc Af Amer: >60 mL/min (ref >60)
GLUCOSE: 175 mg/dL — AB (ref 65–99)
POTASSIUM: 3.9 mmol/L (ref 3.5–5.1)
Sodium: 137 mmol/L (ref 135–145)

## 2014-09-15 LAB — HEMOGLOBIN A1C
Hgb A1c MFr Bld: 7.1 % — ABNORMAL HIGH (ref 4.8–5.6)
MEAN PLASMA GLUCOSE: 157 mg/dL

## 2014-09-15 LAB — CBC
HEMATOCRIT: 34 % — AB (ref 39.0–52.0)
Hemoglobin: 11.3 g/dL — ABNORMAL LOW (ref 13.0–17.0)
MCH: 29.7 pg (ref 26.0–34.0)
MCHC: 33.2 g/dL (ref 30.0–36.0)
MCV: 89.2 fL (ref 78.0–100.0)
PLATELETS: 193 10*3/uL (ref 150–400)
RBC: 3.81 MIL/uL — AB (ref 4.22–5.81)
RDW: 13 % (ref 11.5–15.5)
WBC: 6.4 10*3/uL (ref 4.0–10.5)

## 2014-09-15 NOTE — Evaluation (Signed)
Physical Therapy Evaluation Patient Details Name: [name] MRN: [id number] DOB: January 03, 1933 Today's Date: 09/15/2014   History of Present Illness  81 y.o. male with a past medical history of diabetes, on insulin, coronary artery disease, chronic angina, hyperlipidemia, s/p cardiac cath in August. Episode of unresponsiveness due to CBG in the 50's.  Clinical Impression  Patient in bed, eager to participate in PT today in order to get OOB. Patient presents with problems listed below (see PT Problem List), and was able to ambulate and transfer as described below. Patient reports being significantly lower than baseline. Patient will benefit from continued PT to improve balance and independence with ambulation to return him to his previously active and independent lifestyle.    Follow Up Recommendations No PT follow up;Supervision for mobility/OOB    Equipment Recommendations  None recommended by PT    Recommendations for Other Services       Precautions / Restrictions Precautions Precautions: None Restrictions Weight Bearing Restrictions: No      Mobility  Bed Mobility Overal bed mobility: Modified Independent             General bed mobility comments: Slight use of bed rails.  Transfers Overall transfer level: Needs assistance Equipment used: 1 person hand held assist Transfers: Sit to/from Stand Sit to Stand: Min guard         General transfer comment: HHA x1 for steadiness once in standing. Patient feels weaker than baseline due to being in hospital bed since Saturday. Has not gotten up due to low CBG.  Ambulation/Gait Ambulation/Gait assistance: Min assist Ambulation Distance (Feet): 800 Feet Assistive device: 1 person hand held assist Gait Pattern/deviations: Step-through pattern;Decreased stride length;Drifts right/left Gait velocity: Decreased   General Gait Details: Patient reports significantly decreased gait speed, as well as feeling unsteady on his  feet. Denied dizziness or lightheadedness throughout. States he is weaker than normal due to being in the bed for so long. Multiple episodes of mild LOB that he was able to correct by slowing or halting gait, one episode requiring min A.   Stairs            Wheelchair Mobility    Modified Rankin (Stroke Patients Only)       Balance Overall balance assessment: Needs assistance Sitting-balance support: Feet supported;No upper extremity supported Sitting balance-Leahy Scale: Good     Standing balance support: Single extremity supported Standing balance-Leahy Scale: Fair Standing balance comment: One LOB at end of ambulation required patient to hold onto recliner. Able to regain balance after short rest break and maintain standing.                             Pertinent Vitals/Pain Pain Assessment: No/denies pain    Home Living Family/patient expects to be discharged to:: Private residence Living Arrangements: Spouse/significant other;Other (Comment) (Grandson lives next door.) Available Help at Discharge: Family;Available 24 hours/day;Available PRN/intermittently (Grandson works at night.) Type of Home: House Home Access: Stairs to enter Entrance Stairs-Rails: None Technical brewer of Steps: 1 Home Layout: One level Home Equipment: Environmental consultant - 2 wheels;Cane - single point;Shower seat;Grab bars - tub/shower Additional Comments: Wife is main user of DME, reports he has never used AD for ambulation.    Prior Function Level of Independence: Independent         Comments: Tends to 11 acres of lawn regularly.     Hand Dominance   Dominant Hand: Right    Extremity/Trunk  Assessment   Upper Extremity Assessment: Overall WFL for tasks assessed           Lower Extremity Assessment: Overall WFL for tasks assessed      Cervical / Trunk Assessment: Normal  Communication   Communication: No difficulties  Cognition Arousal/Alertness:  Awake/alert Behavior During Therapy: WFL for tasks assessed/performed Overall Cognitive Status: Within Functional Limits for tasks assessed                      General Comments      Exercises        Assessment/Plan    PT Assessment Patient needs continued PT services  PT Diagnosis Generalized weakness   PT Problem List Decreased activity tolerance;Decreased balance;Decreased mobility  PT Treatment Interventions Gait training;Stair training;Functional mobility training;Therapeutic activities;Therapeutic exercise;Balance training;Patient/family education   PT Goals (Current goals can be found in the Care Plan section) Acute Rehab PT Goals Patient Stated Goal: Go home, return to previous lifestyle. PT Goal Formulation: With patient Time For Goal Achievement: 09/29/14 Potential to Achieve Goals: Good    Frequency Min 3X/week   Barriers to discharge        Co-evaluation               End of Session Equipment Utilized During Treatment: Gait belt Activity Tolerance: Patient tolerated treatment well;Patient limited by fatigue Patient left: in chair;with call bell/phone within reach;with family/visitor present Nurse Communication: Mobility status         Time: [id number] PT Time Calculation (min) (ACUTE ONLY): 14 min   Charges:   PT Evaluation $Initial PT Evaluation Tier I: 1 Procedure     PT G CodesRoanna Epley, SPT [phone] 09/15/2014, 2:34 PM

## 2014-09-15 NOTE — Progress Notes (Signed)
PT Cancellation Note  Patient Details Name: [name] MRN: [id number] DOB: Jul 24, 1932   Cancelled Treatment:    Reason Eval/Treat Not Completed: Medical issues which prohibited therapy (pt with blood sugar of 69 and just staring on lunch. will hold until meal completed and attempt as time allows)   Melford Aase 09/15/2014, 12:53 PM Elwyn Reach, [place]

## 2014-09-15 NOTE — Progress Notes (Signed)
Pt c/o feeling light headed CBG done 116 Pt still requested and given 120 cc Orange juice - says his sugar drops quickly felt better almost immediately.

## 2014-09-15 NOTE — Progress Notes (Signed)
[facility] PROGRESS NOTE  [name] [id number] DOB: [birth year] DOA: 09/13/2014  PCP: Rory Percy, MD  Brief HPI: 81 year old Caucasian male with a past medical history of diabetes on insulin, coronary artery disease, chronic angina, was recently hospitalized in early August for chest pain. He underwent cardiac catheterization. Medical management was recommended. He presented to the emergency department yesterday with episode of hypoglycemia after recent increase in the dose of his insulin. He was persistently hypoglycemic and was hospitalized for further management.  Past medical history:  Past Medical History  Diagnosis Date  . CAD in native artery 1995    CABG x 3 - LIMA-LAD, SVG-OM2, SVG-rPDA  . CAD (coronary artery disease) of artery bypass graft 2000    PCI x 2 - ostial & prox-mid LAD (BMS) for atretic LIMA-LAD  . S/P CABG x 3 1995   . S/P Redo CABG x 1 2001    L Radial-LAD after 2 failed attempts @ LIMA-LAD PTCA; LAD stents 100% occluded  . CAD (coronary artery disease)     1995 - CABG x3; redo in CABG x 1 2001 Free Radial to LAD; All grafts patent by Cath 08/2011  . Hypertension, essential   . Hyperlipidemia LDL goal <70   . DM (diabetes mellitus) type II controlled peripheral vascular disorder 2002    Left subclavian and left vertebral artery stenoses  . Asymptomatic stenosis of left vertebral artery 2002, 2005    Status post PTA/stent with redo; normal antegrade flow on dopplers 09/2013  . Stenosis of left subclavian artery 11/2003    Status post PTA/stent --> < 50 % stenosis by Dopplers 09/2013  . Glaucoma     Consultants: None  Procedures: None  Antibiotics: None  Subjective: Patient feels better. No nausea, vomiting. Tolerating his diet. No further episodes of low blood sugar. No further diarrhea.   Objective: Vital Signs  Filed Vitals:   09/14/14 1724 09/14/14 1947 09/15/14 0010 09/15/14 0408  BP:  95/58 118/48 110/90  Pulse:  70 59 63    Temp: 98.4 F (36.9 C) 98 F (36.7 C) 97.9 F (36.6 C) 98.3 F (36.8 C)  TempSrc: Oral Oral Oral Oral  Resp:  25 19 18   Height:      Weight:    59.9 kg (132 lb 0.9 oz)  SpO2:  95% 95% 93%    Intake/Output Summary (Last 24 hours) at 09/15/14 0755 Last data filed at 09/15/14 0600  Gross per 24 hour  Intake   1683 ml  Output    750 ml  Net    933 ml   Filed Weights   09/13/14 2111 09/15/14 0408  Weight: 59.467 kg (131 lb 1.6 oz) 59.9 kg (132 lb 0.9 oz)    General appearance: alert, cooperative, appears stated age and no distress Resp: clear to auscultation bilaterally Cardio: S1, S2 is normal, regular. Systolic murmur appreciated over the precordium. No S3, S4. No rubs, or bruit. No pedal edema. GI: soft, non-tender; bowel sounds normal; no masses,  no organomegaly Neurologic: Alert and oriented 3. No focal neurological deficits noted.  Lab Results:  Basic Metabolic Panel:  Recent Labs Lab 09/13/14 1619 09/14/14 0314 09/15/14 0232  NA 135 139 137  K 3.3* 4.0 3.9  CL 102 105 105  CO2 24 26 26   GLUCOSE 77 49* 175*  BUN 23* 19 17  CREATININE 1.18 1.04 1.12  CALCIUM 9.2 9.6 8.9   Liver Function Tests:  Recent Labs Lab 09/13/14 1619  AST  24  ALT 21  ALKPHOS 30*  BILITOT 0.6  PROT 6.1*  ALBUMIN 3.6   CBC:  Recent Labs Lab 09/13/14 1619 09/14/14 0314 09/15/14 0232  WBC 11.7* 9.6 6.4  NEUTROABS 10.4*  --   --   HGB 11.1* 12.4* 11.3*  HCT 31.8* 35.5* 34.0*  MCV 87.1 87.7 89.2  PLT 170 211 193   Cardiac Enzymes:  Recent Labs Lab 09/13/14 2243 09/14/14 0314 09/14/14 0955  TROPONINI 0.04* 0.05* 0.04*    CBG:  Recent Labs Lab 09/14/14 2204 09/15/14 0009 09/15/14 0209 09/15/14 0407 09/15/14 0630  GLUCAP 141* 134* 203* 140* 102*    Recent Results (from the past 240 hour(s))  MRSA PCR Screening     Status: None   Collection Time: 09/13/14  9:18 PM  Result Value Ref Range Status   MRSA by PCR NEGATIVE NEGATIVE Final    Comment:         The GeneXpert MRSA Assay (FDA approved for NASAL specimens only), is one component of a comprehensive MRSA colonization surveillance program. It is not intended to diagnose MRSA infection nor to guide or monitor treatment for MRSA infections.       Studies/Results: Dg Chest Portable 1 View  09/13/2014   CLINICAL DATA:  Chest pain and hypoglycemia. Initially unresponsive.  EXAM: PORTABLE CHEST - 1 VIEW  COMPARISON:  08/02/2014.  FINDINGS: The heart size and mediastinal contours are within normal limits. Both lungs are clear. The visualized skeletal structures are unremarkable. Prior CABG. LEFT subclavian vascular stent appears stable.  IMPRESSION: No active disease.  Stable chest.   Electronically Signed   By: Staci Righter M.D.   On: 09/13/2014 17:29    Medications:  Scheduled: . aspirin EC  81 mg Oral Daily  . atorvastatin  40 mg Oral q1800  . brimonidine  1 drop Right Eye 3 times per day  . clopidogrel  75 mg Oral Daily  . enoxaparin (LOVENOX) injection  40 mg Subcutaneous Q24H  . gabapentin  300 mg Oral TID  . isosorbide mononitrate  90 mg Oral Daily  . latanoprost  1 drop Left Eye QHS  . lisinopril  20 mg Oral Daily  . pantoprazole  20 mg Oral Daily  . saccharomyces boulardii  250 mg Oral BID  . sodium chloride  3 mL Intravenous Q12H   Continuous:   [id number] **OR** acetaminophen, albuterol, ondansetron **OR** ondansetron (ZOFRAN) IV  Assessment/Plan:  Principal Problem:   Hypoglycemia Active Problems:   Hyperlipidemia with target LDL less than 70   Essential hypertension   DM2 (diabetes mellitus, type 2) - with CAD   CAD -> CABG x3 then Re-DO CABG x1 (LRad-dLAD) after atretic LIMA & LAD stent occlusion.   S/P CABG (coronary artery bypass graft): Initial CABGX3 1995 (LIMA-LAD, SVG-OM 2, SVG-RPDA) --> redo CABG x1 FreeLRad-LAD for a occluded LAD with atretic LIMA and failed attempted revascularization.   Angina at rest    Hypoglycemia in the setting of  known type 2 diabetes Blood sugars appeared to have stabilized. After being initiated on D10 infusion yesterday morning, his blood sugars went up to greater than 200. Infusion was discontinued yesterday afternoon. Blood sugars have been stable throughout the night. Hypoglycemia is most likely secondary to increased dose of insulin that he was asked to take recently by his [place] providers. Continue close monitoring for now. HbA1c was 6.5 in July. Repeat HbA1c is 7.1. Continue to hold all of his glucose lowering drugs. It likely that he is being  aggressively treated. He is an elderly male and does not need to be so aggressively treated for his diabetes. Previously he was taking glipizide on a daily basis and then would take insulin only if blood sugar was greater than 200. Recently asked by his providers to consistently take his insulin twice a day. Do not suspect any other etiology such as sepsis as causing his low blood sugar. Lactic acid was mildly elevated initially. Subsequently normal.   Chest pain in the setting of known CAD He has lesions that are not amenable to intervention based on recent cardiac catheterization. Continue with his imdur at current dose. Minimal elevation in troponin is noted. Do not anticipate any further workup in the hospital.  Coronary artery disease Continue medical management. Continue aspirin, Plavix, ACE inhibitor. Not on a beta blocker due to history of significant bradycardia.  Diarrhea No further episodes of diarrhea over the last 24 hours. Sample for C. difficile could not be sent. Unlikely this is an infectious process. Continue to monitor for now.   History of essential hypertension Blood pressure is stable and reasonably well controlled. Continue current management.  History of dyslipidemia Continue statin  History of peripheral vascular disease Stable. Continue antiplatelets agents and medical management.  History of glaucoma Continue with his eyedrops.  Noted to be on Timolol eyedrops which can worsen his bradycardia. Hold for now.  DVT Prophylaxis: Lovenox    Code Status: Full code  Family Communication: Discussed with the patient  Disposition Plan: PT and OT to evaluate. Okay for transfer to telemetry. Possible discharge tomorrow.     LOS: 2 days   [facility] Pager [phone] 09/15/2014, 7:55 AM  If 7PM-7AM, please contact night-coverage at [email or web address], password [facility]

## 2014-09-16 LAB — GLUCOSE, CAPILLARY
GLUCOSE-CAPILLARY: 53 mg/dL — AB (ref 65–99)
GLUCOSE-CAPILLARY: 78 mg/dL (ref 65–99)
GLUCOSE-CAPILLARY: 90 mg/dL (ref 65–99)
Glucose-Capillary: 117 mg/dL — ABNORMAL HIGH (ref 65–99)
Glucose-Capillary: 95 mg/dL (ref 65–99)
Glucose-Capillary: 134 mg/dL — ABNORMAL HIGH (ref 65–99)

## 2014-09-16 LAB — BASIC METABOLIC PANEL
Anion gap: 8 (ref 5–15)
BUN: 18 mg/dL (ref 6–20)
CHLORIDE: 104 mmol/L (ref 101–111)
CO2: 27 mmol/L (ref 22–32)
CREATININE: 0.99 mg/dL (ref 0.61–1.24)
Calcium: 9.4 mg/dL (ref 8.9–10.3)
Glucose, Bld: 59 mg/dL — ABNORMAL LOW (ref 65–99)
POTASSIUM: 4 mmol/L (ref 3.5–5.1)
SODIUM: 139 mmol/L (ref 135–145)

## 2014-09-16 MED ORDER — ISOSORBIDE MONONITRATE ER 60 MG PO TB24
120.0000 mg | ORAL_TABLET | Freq: Every day | ORAL | Status: DC
  Administered 2014-09-16 – 2014-09-17 (×2): 120 mg via ORAL
  Filled 2014-09-16: qty 2
  Filled 2014-09-16: qty 4

## 2014-09-16 NOTE — Progress Notes (Signed)
Hypoglycemic Event  CBG:   61  Treatment: 1/2 AMP D50  Symptoms: None  Follow-up CBG: Time: 0134 CBG Result:  116  Possible Reasons for Event: Inadequate meal intake  Comments/MD notified: MD notified    [name] C  Remember to initiate Hypoglycemia Order Set & complete

## 2014-09-16 NOTE — Progress Notes (Addendum)
Hypoglycemic Event  CBG: 47  Treatment: D50 IV 50 mL  Symptoms: Sweaty, Hungry and Vision changes  Follow-up CBG: [id number]  Result:177  Possible Reasons for Event: Unknown  Comments/MD notified: MD notified    Nani Ravens C  Remember to initiate Hypoglycemia Order Set & complete

## 2014-09-16 NOTE — Progress Notes (Signed)
Pt transferred to room 2W13 at 1830. He is alert and oriented x 4. He denied any pain on arrival to 2W. Pt was oriented to room and unit routines. Pt reminded to call for assistance for ambulation or transfers. He verbalized understanding.

## 2014-09-16 NOTE — Progress Notes (Addendum)
Hypoglycemic Event  CBG: 53  Treatment: 15 GM carbohydrate snack  Symptoms: Pale  Follow-up CBG: Time:0400 CBG Result: 78  Possible Reasons for Event: Unknown  Comments/MD notified: NP made aware.     Monico Hoar M  Remember to initiate Hypoglycemia Order Set & complete

## 2014-09-16 NOTE — Progress Notes (Signed)
[facility] PROGRESS NOTE  [name] [id number] DOB: 03-Jul-1932 DOA: 09/13/2014  PCP: Rory Percy, MD  Brief HPI: 81 year old Caucasian male with a past medical history of diabetes on insulin, coronary artery disease, chronic angina, was recently hospitalized in early August for chest pain. He underwent cardiac catheterization. Medical management was recommended. He presented to the emergency department yesterday with episode of hypoglycemia after recent increase in the dose of his insulin. He was persistently hypoglycemic and was hospitalized for further management. He was initially placed on a D5 infusion. Subsequently changed over to D10 infusion. Currently off IV fluids.  Past medical history:  Past Medical History  Diagnosis Date  . CAD in native artery 1995    CABG x 3 - LIMA-LAD, SVG-OM2, SVG-rPDA  . CAD (coronary artery disease) of artery bypass graft 2000    PCI x 2 - ostial & prox-mid LAD (BMS) for atretic LIMA-LAD  . S/P CABG x 3 1995   . S/P Redo CABG x 1 2001    L Radial-LAD after 2 failed attempts @ LIMA-LAD PTCA; LAD stents 100% occluded  . CAD (coronary artery disease)     1995 - CABG x3; redo in CABG x 1 2001 Free Radial to LAD; All grafts patent by Cath 08/2011  . Hypertension, essential   . Hyperlipidemia LDL goal <70   . DM (diabetes mellitus) type II controlled peripheral vascular disorder 2002    Left subclavian and left vertebral artery stenoses  . Asymptomatic stenosis of left vertebral artery 2002, 2005    Status post PTA/stent with redo; normal antegrade flow on dopplers 09/2013  . Stenosis of left subclavian artery 11/2003    Status post PTA/stent --> < 50 % stenosis by Dopplers 09/2013  . Glaucoma     Consultants: None  Procedures: None  Antibiotics: None  Subjective: Patient feels well. Had another episode of low sugar overnight. He denies any loss of appetite. He states that he is eating most of his meals. No further diarrhea.    Objective: Vital Signs  Filed Vitals:   09/15/14 2000 09/16/14 0000 09/16/14 0400 09/16/14 0500  BP: 118/57     Pulse: 57     Temp: 97.9 F (36.6 C) 98.1 F (36.7 C) 98 F (36.7 C)   TempSrc: Oral Oral Oral   Resp: 21     Height:      Weight:    60.2 kg (132 lb 11.5 oz)  SpO2: 98%       Intake/Output Summary (Last 24 hours) at 09/16/14 0743 Last data filed at 09/16/14 0000  Gross per 24 hour  Intake    893 ml  Output   1150 ml  Net   -257 ml   Filed Weights   09/13/14 2111 09/15/14 0408 09/16/14 0500  Weight: 59.467 kg (131 lb 1.6 oz) 59.9 kg (132 lb 0.9 oz) 60.2 kg (132 lb 11.5 oz)    General appearance: alert, cooperative, appears stated age and no distress Resp: clear to auscultation bilaterally Cardio: S1, S2 is normal, regular. Systolic murmur appreciated over the precordium. No S3, S4. No rubs, or bruit. No pedal edema. GI: soft, non-tender; bowel sounds normal; no masses,  no organomegaly Neurologic: Alert and oriented 3. No focal neurological deficits noted.  Lab Results:  Basic Metabolic Panel:  Recent Labs Lab 09/13/14 1619 09/14/14 0314 09/15/14 0232 09/16/14 0240  NA 135 139 137 139  K 3.3* 4.0 3.9 4.0  CL 102 105 105 104  CO2  24 26 26 27   GLUCOSE 77 49* 175* 59*  BUN 23* 19 17 18   CREATININE 1.18 1.04 1.12 0.99  CALCIUM 9.2 9.6 8.9 9.4   Liver Function Tests:  Recent Labs Lab 09/13/14 1619  AST 24  ALT 21  ALKPHOS 30*  BILITOT 0.6  PROT 6.1*  ALBUMIN 3.6   CBC:  Recent Labs Lab 09/13/14 1619 09/14/14 0314 09/15/14 0232  WBC 11.7* 9.6 6.4  NEUTROABS 10.4*  --   --   HGB 11.1* 12.4* 11.3*  HCT 31.8* 35.5* 34.0*  MCV 87.1 87.7 89.2  PLT 170 211 193   Cardiac Enzymes:  Recent Labs Lab 09/13/14 2243 09/14/14 0314 09/14/14 0955  TROPONINI 0.04* 0.05* 0.04*    CBG:  Recent Labs Lab 09/15/14 1538 09/15/14 2110 09/16/14 0331 09/16/14 0414 09/16/14 0704  GLUCAP 116* 104* 53* 78 117*    Recent Results (from  the past 240 hour(s))  MRSA PCR Screening     Status: None   Collection Time: 09/13/14  9:18 PM  Result Value Ref Range Status   MRSA by PCR NEGATIVE NEGATIVE Final    Comment:        The GeneXpert MRSA Assay (FDA approved for NASAL specimens only), is one component of a comprehensive MRSA colonization surveillance program. It is not intended to diagnose MRSA infection nor to guide or monitor treatment for MRSA infections.       Studies/Results: No results found.  Medications:  Scheduled: . aspirin EC  81 mg Oral Daily  . atorvastatin  40 mg Oral q1800  . brimonidine  1 drop Right Eye 3 times per day  . clopidogrel  75 mg Oral Daily  . enoxaparin (LOVENOX) injection  40 mg Subcutaneous Q24H  . gabapentin  300 mg Oral TID  . isosorbide mononitrate  120 mg Oral Daily  . latanoprost  1 drop Left Eye QHS  . lisinopril  20 mg Oral Daily  . pantoprazole  20 mg Oral Daily  . saccharomyces boulardii  250 mg Oral BID  . sodium chloride  3 mL Intravenous Q12H   Continuous:   [id number] **OR** acetaminophen, albuterol, ondansetron **OR** ondansetron (ZOFRAN) IV  Assessment/Plan:  Principal Problem:   Hypoglycemia Active Problems:   Hyperlipidemia with target LDL less than 70   Essential hypertension   DM2 (diabetes mellitus, type 2) - with CAD   CAD -> CABG x3 then Re-DO CABG x1 (LRad-dLAD) after atretic LIMA & LAD stent occlusion.   S/P CABG (coronary artery bypass graft): Initial CABGX3 1995 (LIMA-LAD, SVG-OM 2, SVG-RPDA) --> redo CABG x1 FreeLRad-LAD for a occluded LAD with atretic LIMA and failed attempted revascularization.   Angina at rest    Hypoglycemia in the setting of known type 2 diabetes Still with occasional drop in blood glucose levels. No sleep at nighttime. We will check 3 AM glucose levels. Surprising that this is occurring despite more than 48 hours of hospitalization. Still appears to be primarily due to excessive use of insulin. He does have a  good appetite. Has been consuming more than 75% of his meals per nursing staff. We will check an a.m. cortisol level tomorrow morning. Continue to observe him in the hospital for now. Hypoglycemia is most likely secondary to increased dose of insulin that he was asked to take recently by his [facility] providers. HbA1c was 6.5 in July. Repeat HbA1c is 7.1. Continue to hold all of his glucose lowering drugs. It likely that he is being aggressively treated. Previously he  was taking glipizide on a daily basis and then would take insulin only if blood sugar was greater than 200. Recently asked by his providers to consistently take his insulin twice a day. Do not suspect any other etiology such as sepsis as causing his low blood sugar. Lactic acid was mildly elevated initially. Subsequently normal.   Chest pain in the setting of known CAD He has chronic angina. He has lesions that are not amenable to intervention based on recent cardiac catheterization. Pain persists. Will increase the dose of Imdur. Minimal elevation in troponin is noted. Do not anticipate any further workup in the hospital.  Coronary artery disease Continue medical management. Continue aspirin, Plavix, ACE inhibitor. Not on a beta blocker due to history of significant bradycardia. Outpatient follow-up with cardiology.  Diarrhea Diarrhea subsided. Sample for C. difficile could not be sent. Unlikely this is an infectious process. Continue to monitor for now.   History of essential hypertension Blood pressure is stable and reasonably well controlled. Continue current management.  History of dyslipidemia Continue statin  History of peripheral vascular disease Stable. Continue antiplatelets agents and medical management.  History of glaucoma Continue with his eyedrops. Noted to be on Timolol eyedrops which can worsen his bradycardia. Hold for now.  DVT Prophylaxis: Lovenox    Code Status: Full code  Family Communication: Discussed with the  patient  Disposition Plan: PT and OT has seen the patient. Okay for transfer to telemetry. Unfortunately, discharge will have to be delayed till blood sugars remain stable.      LOS: 3 days   [facility] Pager [phone] 09/16/2014, 7:43 AM  If 7PM-7AM, please contact night-coverage at [email or web address], password [email or web address]

## 2014-09-16 NOTE — Care Management Important Message (Signed)
Important Message  Patient Details  Name: KERBY FOLLOWELL MRN: [id number] Date of Birth: August 10, 1932   Medicare Important Message Given:  Yes-second notification given    Nathen May 09/16/2014, 10:46 AMImportant Message  Patient Details  Name: DIMARCO HOLSTAD MRN: [id number] Date of Birth: 1932-07-31   Medicare Important Message Given:  Yes-second notification given    Nathen May 09/16/2014, 10:45 AM

## 2014-09-17 DIAGNOSIS — Z951 Presence of aortocoronary bypass graft: Secondary | ICD-10-CM

## 2014-09-17 DIAGNOSIS — I208 Other forms of angina pectoris: Secondary | ICD-10-CM

## 2014-09-17 DIAGNOSIS — E785 Hyperlipidemia, unspecified: Secondary | ICD-10-CM

## 2014-09-17 DIAGNOSIS — I10 Essential (primary) hypertension: Secondary | ICD-10-CM

## 2014-09-17 DIAGNOSIS — I25708 Atherosclerosis of coronary artery bypass graft(s), unspecified, with other forms of angina pectoris: Secondary | ICD-10-CM | POA: Insufficient documentation

## 2014-09-17 DIAGNOSIS — E162 Hypoglycemia, unspecified: Secondary | ICD-10-CM

## 2014-09-17 DIAGNOSIS — E1169 Type 2 diabetes mellitus with other specified complication: Secondary | ICD-10-CM

## 2014-09-17 LAB — BASIC METABOLIC PANEL
Anion gap: 9 (ref 5–15)
BUN: 19 mg/dL (ref 6–20)
CO2: 24 mmol/L (ref 22–32)
Calcium: 9 mg/dL (ref 8.9–10.3)
Chloride: 104 mmol/L (ref 101–111)
Creatinine, Ser: 1.02 mg/dL (ref 0.61–1.24)
GFR calc Af Amer: >60 mL/min (ref >60)
GLUCOSE: 99 mg/dL (ref 65–99)
POTASSIUM: 4.1 mmol/L (ref 3.5–5.1)
Sodium: 137 mmol/L (ref 135–145)

## 2014-09-17 LAB — GLUCOSE, CAPILLARY
GLUCOSE-CAPILLARY: 87 mg/dL (ref 65–99)
GLUCOSE-CAPILLARY: 92 mg/dL (ref 65–99)

## 2014-09-17 LAB — CORTISOL-AM, BLOOD: Cortisol - AM: 11.2 ug/dL (ref 6.7–22.6)

## 2014-09-17 MED ORDER — INSULIN ASPART PROT & ASPART (70-30 MIX) 100 UNIT/ML ~~LOC~~ SUSP: 10.0000 [IU] | Freq: Two times a day (BID) | SUBCUTANEOUS | Status: DC

## 2014-09-17 MED ORDER — GLIPIZIDE ER 5 MG PO TB24: 5.0000 mg | ORAL_TABLET | Freq: Two times a day (BID) | ORAL | Status: DC

## 2014-09-17 MED ORDER — ISOSORBIDE MONONITRATE ER 120 MG PO TB24: 120.0000 mg | ORAL_TABLET | Freq: Every day | ORAL | Status: DC

## 2014-09-17 NOTE — Progress Notes (Signed)
Discharged home with wife, NSL & Telemetry discontinued, Reviewed discharge instructions with patient & wife.  Mervyn Skeeters, RN

## 2014-09-17 NOTE — Discharge Summary (Signed)
Physician Discharge Summary  [name] [id number] DOB: [birth year] DOA: 09/13/2014  PCP: Rory Percy, MD  Admit date: 09/13/2014 Discharge date: 09/17/2014  Time spent: 40 minutes  Recommendations for Outpatient Follow-up:  1. Close follow up of patient's CBG's and diabetes; adjust hypoglycemic regimen as needed 2. Repeat BMET to follow electrolytes and renal function   Discharge Diagnoses:  Principal Problem:   Hypoglycemia Active Problems:   Hyperlipidemia with target LDL less than 70   Essential hypertension   DM2 (diabetes mellitus, type 2) - with CAD   CAD -> CABG x3 then Re-DO CABG x1 (LRad-dLAD) after atretic LIMA & LAD stent occlusion.   S/P CABG (coronary artery bypass graft): Initial CABGX3 1995 (LIMA-LAD, SVG-OM 2, SVG-RPDA) --> redo CABG x1 FreeLRad-LAD for a occluded LAD with atretic LIMA and failed attempted revascularization.   Angina at rest   Discharge Condition: stable and improved. No CP, no SOB and no further hypoglycemic events in > 24 hours prior to discharge. Patient will follow up with PCP in 10 days   Diet recommendation: heart healthy diet; also advise to watch carbohydrates intake  Filed Weights   09/13/14 2111 09/15/14 0408 09/16/14 0500  Weight: 59.467 kg (131 lb 1.6 oz) 59.9 kg (132 lb 0.9 oz) 60.2 kg (132 lb 11.5 oz)    History of present illness:  81 year old Caucasian male with a past medical history of diabetes on insulin, coronary artery disease, chronic angina, was recently hospitalized in early August for chest pain. He underwent cardiac catheterization. Medical management was recommended. He presented to the emergency department yesterday with episode of hypoglycemia after recent increase in the dose of his insulin. He was persistently hypoglycemic and was hospitalized for further management.  Hospital Course:  Hypoglycemia in the setting of known type 2 diabetes No further hypoglycemia Appears to be secondary to too much  hypoglycemic regimen Will discharge on low dose glipizide BID and very low dose insulin dose (in comparison to PTA dose) Follow up with PCP in 10 days Advise to follow CBG's three times a day and not to skip meals  Chest pain in the setting of known CAD He has chronic angina. He has lesions that are not amenable to intervention based on recent cardiac catheterization (08/2014).  Pain has now resolved. Will discharge on increase dose of imdur. Minimal/plateau elevation in troponin was noted; No further workup in the hospital needed. Patient will follow with cardiology service as an outpatient  Coronary artery disease Continue medical management.  Continue aspirin, Plavix, ACE inhibitor and imdur. Not on a beta blocker due to history of significant bradycardia.  Outpatient follow-up with cardiology.  Diarrhea Diarrhea subsided.  Most likely secondary to hypoglycemia Patient advise to keep himself well hydrated  History of essential hypertension Blood pressure is stable and reasonably well controlled.  Will continue home antihypertensive regimen Advise to follow heart healthy diet  History of dyslipidemia Continue statin  History of peripheral vascular disease Stable. Continue antiplatelets agents and medical management.  History of glaucoma Continue with his eyedrops.   Procedures:  See below for x-ray reports   Consultations:  None   Discharge Exam: Filed Vitals:   09/17/14 1003  BP: 136/62  Pulse:   Temp:   Resp:    General appearance: AAOX3and in no distress. Denies CP, nausea, vomiting and is afebrile Resp: clear to auscultation bilaterally Cardio: S1, S2 is normal, regular. Systolic murmur appreciated over the precordium. No S3, S4. No rubs, or bruit. No pedal edema. GI:  soft, non-tender; bowel sounds normal; no masses, no organomegaly Neurologic: No focal neurological deficits noted.   Discharge Instructions   Discharge Instructions    Diet - low  sodium heart healthy    Complete by:  As directed      Discharge instructions    Complete by:  As directed   Follow up with PCP in 10 days Maintain yourself well hydrated Please avoid skipping meals Take insulin 10 units BID; starting 9/16 Please check CBG's three times a day Notice glipizide dose is 5mg  twice a day          Current Discharge Medication List    CONTINUE these medications which have CHANGED   Details  glipiZIDE (GLUCOTROL XL) 5 MG 24 hr tablet Take 1 tablet (5 mg total) by mouth 2 (two) times daily. Qty: 60 tablet, Refills: 1    insulin aspart protamine- aspart (NOVOLOG MIX 70/30) (70-30) 100 UNIT/ML injection Inject 0.1 mLs (10 Units total) into the skin 2 (two) times daily with a meal. 36 units every morning and 30 units every evening    isosorbide mononitrate (IMDUR) 120 MG 24 hr tablet Take 1 tablet (120 mg total) by mouth daily. Qty: 30 tablet, Refills: 1      CONTINUE these medications which have NOT CHANGED   Details  acetaminophen (TYLENOL) 500 MG tablet Take 1,000 mg by mouth every 6 (six) hours as needed for mild pain or headache.    Ascorbic Acid (VITAMIN C) 1000 MG tablet Take 1,000 mg by mouth daily.    aspirin EC 81 MG tablet Take 81 mg by mouth daily.    brimonidine (ALPHAGAN P) 0.1 % SOLN Place 1 drop into the right eye 3 (three) times daily.     Cholecalciferol (VITAMIN D-3) 1000 UNITS CAPS Take 1,000 Units by mouth daily.    clopidogrel (PLAVIX) 75 MG tablet Take 1 tablet (75 mg total) by mouth daily. Qty: 30 tablet, Refills: 1    doxazosin (CARDURA) 4 MG tablet Take 2 mg by mouth at bedtime.     gabapentin (NEURONTIN) 300 MG capsule Take 300 mg by mouth 3 (three) times daily.    lansoprazole (PREVACID) 15 MG capsule Take 15 mg by mouth daily at 12 noon.    latanoprost (XALATAN) 0.005 % ophthalmic solution Place 1 drop into both eyes at bedtime.     lisinopril (PRINIVIL,ZESTRIL) 40 MG tablet Take 0.5 tablets (20 mg total) by mouth  daily. Qty: 30 tablet, Refills: 0    loratadine (CLARITIN) 10 MG tablet Take 10 mg by mouth daily as needed. Forb  Seasonal allergies    Omega-3 Fatty Acids (FISH OIL) 300 MG CAPS Take 300 mg by mouth 2 (two) times daily.     saccharomyces boulardii (FLORASTOR) 250 MG capsule Take 1 capsule (250 mg total) by mouth 2 (two) times daily. Qty: 60 capsule, Refills: 0    saw palmetto 160 MG capsule Take 480 mg by mouth daily.    timolol (TIMOPTIC) 0.5 % ophthalmic solution Place 1 drop into the left eye 3 (three) times daily.     atorvastatin (LIPITOR) 40 MG tablet Take 1 tablet (40 mg total) by mouth daily at 6 PM. Qty: 30 tablet, Refills: 0      STOP taking these medications     simvastatin (ZOCOR) 80 MG tablet        Allergies  Allergen Reactions  . Iohexol Other (See Comments)    Intractable shaking.  . Contrast Media [Iodinated Diagnostic Agents]  Shivering & shaking. Pt reports takes antihistamine prior to procedures.  . Metformin And Related Diarrhea    Subsequently discontinued   Follow-up Information    Follow up with Rory Percy, MD. Schedule an appointment as soon as possible for a visit in 10 days.   Specialty:  Family Medicine   Contact information:   [place] [address] [phone]       The results of significant diagnostics from this hospitalization (including imaging, microbiology, ancillary and laboratory) are listed below for reference.    Significant Diagnostic Studies: Dg Chest Portable 1 View  09/13/2014   CLINICAL DATA:  Chest pain and hypoglycemia. Initially unresponsive.  EXAM: PORTABLE CHEST - 1 VIEW  COMPARISON:  08/02/2014.  FINDINGS: The heart size and mediastinal contours are within normal limits. Both lungs are clear. The visualized skeletal structures are unremarkable. Prior CABG. LEFT subclavian vascular stent appears stable.  IMPRESSION: No active disease.  Stable chest.   Electronically Signed   By: Staci Righter M.D.   On:  09/13/2014 17:29    Microbiology: Recent Results (from the past 240 hour(s))  MRSA PCR Screening     Status: None   Collection Time: 09/13/14  9:18 PM  Result Value Ref Range Status   MRSA by PCR NEGATIVE NEGATIVE Final    Comment:        The GeneXpert MRSA Assay (FDA approved for NASAL specimens only), is one component of a comprehensive MRSA colonization surveillance program. It is not intended to diagnose MRSA infection nor to guide or monitor treatment for MRSA infections.      Labs: Basic Metabolic Panel:  Recent Labs Lab 09/13/14 1619 09/14/14 0314 09/15/14 0232 09/16/14 0240 09/17/14 0505  NA 135 139 137 139 137  K 3.3* 4.0 3.9 4.0 4.1  CL 102 105 105 104 104  CO2 24 26 26 27 24   GLUCOSE 77 49* 175* 59* 99  BUN 23* 19 17 18 19   CREATININE 1.18 1.04 1.12 0.99 1.02  CALCIUM 9.2 9.6 8.9 9.4 9.0   Liver Function Tests:  Recent Labs Lab 09/13/14 1619  AST 24  ALT 21  ALKPHOS 30*  BILITOT 0.6  PROT 6.1*  ALBUMIN 3.6   CBC:  Recent Labs Lab 09/13/14 1619 09/14/14 0314 09/15/14 0232  WBC 11.7* 9.6 6.4  NEUTROABS 10.4*  --   --   HGB 11.1* 12.4* 11.3*  HCT 31.8* 35.5* 34.0*  MCV 87.1 87.7 89.2  PLT 170 211 193   Cardiac Enzymes:  Recent Labs Lab 09/13/14 2243 09/14/14 0314 09/14/14 0955  TROPONINI 0.04* 0.05* 0.04*   CBG:  Recent Labs Lab 09/16/14 0806 09/16/14 1320 09/16/14 2058 09/17/14 0410 09/17/14 0618  GLUCAP 95 90 134* 87 92    Signed:  Barton Dubois  [facility] 09/17/2014, 10:58 AM

## 2014-09-18 DIAGNOSIS — E11649 Type 2 diabetes mellitus with hypoglycemia without coma: Secondary | ICD-10-CM | POA: Diagnosis not present

## 2014-09-18 DIAGNOSIS — Z23 Encounter for immunization: Secondary | ICD-10-CM | POA: Diagnosis not present

## 2014-10-17 DIAGNOSIS — E11649 Type 2 diabetes mellitus with hypoglycemia without coma: Secondary | ICD-10-CM | POA: Diagnosis not present

## 2014-10-17 DIAGNOSIS — K219 Gastro-esophageal reflux disease without esophagitis: Secondary | ICD-10-CM | POA: Diagnosis not present

## 2014-10-17 DIAGNOSIS — I2510 Atherosclerotic heart disease of native coronary artery without angina pectoris: Secondary | ICD-10-CM | POA: Diagnosis not present

## 2014-10-17 DIAGNOSIS — I10 Essential (primary) hypertension: Secondary | ICD-10-CM | POA: Diagnosis not present

## 2014-11-17 DIAGNOSIS — J019 Acute sinusitis, unspecified: Secondary | ICD-10-CM | POA: Diagnosis not present

## 2014-12-02 DIAGNOSIS — K219 Gastro-esophageal reflux disease without esophagitis: Secondary | ICD-10-CM | POA: Diagnosis not present

## 2014-12-02 DIAGNOSIS — I10 Essential (primary) hypertension: Secondary | ICD-10-CM | POA: Diagnosis not present

## 2014-12-02 DIAGNOSIS — I2510 Atherosclerotic heart disease of native coronary artery without angina pectoris: Secondary | ICD-10-CM | POA: Diagnosis not present

## 2014-12-02 DIAGNOSIS — E1165 Type 2 diabetes mellitus with hyperglycemia: Secondary | ICD-10-CM | POA: Diagnosis not present

## 2014-12-09 ENCOUNTER — Ambulatory Visit (INDEPENDENT_AMBULATORY_CARE_PROVIDER_SITE_OTHER): Payer: Medicare Other | Admitting: Cardiology

## 2014-12-09 VITALS — BP 124/58 | HR 58 | Ht 67.0 in | Wt 139.1 lb

## 2014-12-09 DIAGNOSIS — M79602 Pain in left arm: Secondary | ICD-10-CM

## 2014-12-09 DIAGNOSIS — E785 Hyperlipidemia, unspecified: Secondary | ICD-10-CM

## 2014-12-09 DIAGNOSIS — I25119 Atherosclerotic heart disease of native coronary artery with unspecified angina pectoris: Secondary | ICD-10-CM | POA: Diagnosis not present

## 2014-12-09 DIAGNOSIS — I739 Peripheral vascular disease, unspecified: Secondary | ICD-10-CM

## 2014-12-09 DIAGNOSIS — I10 Essential (primary) hypertension: Secondary | ICD-10-CM

## 2014-12-09 DIAGNOSIS — M79601 Pain in right arm: Secondary | ICD-10-CM

## 2014-12-09 DIAGNOSIS — I25708 Atherosclerosis of coronary artery bypass graft(s), unspecified, with other forms of angina pectoris: Secondary | ICD-10-CM

## 2014-12-09 DIAGNOSIS — I208 Other forms of angina pectoris: Secondary | ICD-10-CM | POA: Diagnosis not present

## 2014-12-09 DIAGNOSIS — Z951 Presence of aortocoronary bypass graft: Secondary | ICD-10-CM

## 2014-12-09 NOTE — Progress Notes (Signed)
PCP: Rory Percy, MD  Clinic Note: Chief Complaint  Patient presents with  . Follow-up  . Chest Pain    pt states he always have chest pain, states Dr. Ellyn Hack knows about it  . Edema    no edema  . Shortness of Breath    no SOB, no light headedness or dizziness    HPI: [name] is a 81 y.o. male with a PMH below who presents today for 4 month f/u - h/o CAD, chronic stable Angina.. Ranexa too expensive.  AVYN HENNESY was last seen in August 2016 By Craig Jester, PA-C following Cath revealing retrograde LAD stent ISR - no PCI option. Med Rx.  ? Metformin related nausea -- then had CP --> cath.  Plan was Ranexa --> due to $$, changed to Imdur.  Recent Hospitalizations: Aug. - cath Sept 10-14 -> hypoglycemia; changed DM meds  Studies Reviewed:   Echo 07/2014  Myoview 07/24/14  Cath: 08/04/2014  Conclusion    3. Ost RCA to Mid RCA lesion, 70% stenosed. Mid RCA to Dist RCA lesion, 70% stenosed. 4. Ost LAD to Prox LAD stented segment 100% stenosed. Mid LAD to Dist LAD stented segment 95% stenosed (visualized via retrograde filling from Radial Graft. 5. Widely patent SVG-distal RCA with Retrograde flow almost all the way up to the proximal 70% stenosis of the native RCA . Antegrade flow fills the entire posterolateral system with minimal disease. 6. Ost Cx to Prox Cx lesion, 80% stenosed. Widely patent SVG-CxOM. Retrograde flow from the native circumflex in the graft fills the entire graft back to the ostium. 7. Roughly 50% ostial stenosis of the free radial artery graft to the LAD. 8. Normal LV function and EDP  Likely culprit lesion for the patient's chest pain and abnormal stress test is the poor retrograde filling of the mid LAD 95% in-stent restenosis. Or truly this cannot be approached from a percutaneous option.  Recommendation is to continue medical management.     Interval History: [name] presents today with his same standard complaints of almost  daily "angina" CP.  He does not take NTG.  Worse with deep breath.  OK with routine activities.  Mostly occurs @ rest & not with exertion.  He does not that he may get "weak" with activity. Notes pain in his L arm with any movement - l arm gets numb & hurts - especially when he moves his had/arm above his head / shoulder level.  No shortness of breath with rest or exertion.  No CHF Sx of  PND, orthopnea or edema.  No palpitations, lightheadedness, dizziness, weakness or syncope/near syncope.  May get dizzy when hypoglycemic.  No TIA/amaurosis fugax symptoms.   ROS: A comprehensive was performed. Review of Systems  Constitutional: Positive for malaise/fatigue (with exertion).  Respiratory: Positive for shortness of breath (if he over exerts).   Cardiovascular: Positive for claudication (L arm).  Gastrointestinal: Negative for blood in stool and melena.  Genitourinary: Negative for hematuria.  Neurological: Positive for dizziness (if hypoglycemic). Negative for headaches.  Endo/Heme/Allergies: Does not bruise/bleed easily.  Psychiatric/Behavioral: Negative for depression.  All other systems reviewed and are negative.   Past Medical History  Diagnosis Date  . CAD in native artery 1995    CABG x 3 - LIMA-LAD, SVG-OM2, SVG-rPDA  . CAD (coronary artery disease) of artery bypass graft 2000    PCI x 2 - ostial & prox-mid LAD (BMS) for atretic LIMA-LAD  . S/P CABG x 3 1995   .  S/P Redo CABG x 1 2001    L Radial-LAD after 2 failed attempts @ LIMA-LAD PTCA; LAD stents 100% occluded  . CAD (coronary artery disease)     1995 - CABG x3; redo in CABG x 1 2001 Free Radial to LAD; All grafts patent by Cath 08/2011  . Hypertension, essential   . Hyperlipidemia LDL goal <70   . DM (diabetes mellitus) type II controlled peripheral vascular disorder 2002    Left subclavian and left vertebral artery stenoses  . Asymptomatic stenosis of left vertebral artery 2002, 2005    Status post PTA/stent with  redo; normal antegrade flow on dopplers 09/2013  . Stenosis of left subclavian artery 11/2003    Status post PTA/stent --> < 50 % stenosis by Dopplers 09/2013  . Glaucoma     Past Surgical History  Procedure Laterality Date  . Coronary artery bypass graft  1995     LIMA-LAD, SVG-OM2, SVG-rPDA  . Coronary stent placement  1995-2000    2 BMS stents to osital-proximal & proximal-mid LAD; because of atretic LIMA-LDA  . Coronary angioplasty  April and May 2001    After Both LAD stents occluded - PTCA of anastomatic LIMA-LAD lesion -- Unsuccessful.  . Coronary artery bypass graft  June 2001    Dr. Lucianne Lei Trigt: Redo LAD grafting with free Left Radial-distal LAD  . Shoulder open rotator cuff repair  October 2004    Dr. Noemi Chapel  . Sp extracran vert or thor carotid stent Left October 2002; November 2005    Left Vertebral stent placed in October 2002 (Dr. Patrecia Pour); redo PCI in 2005  . Subclavian artery stent Left November 2005    Dr. Gwenlyn Found  . Cardiac catheterization  November 2006; August 13    Known occluded LIMA-LAD and ostial LAD. Moderate to severe proximal circumflex and RCA disease. Widely patent freeLRAD-dLAD, as well as SVG-RPDA (backfilling RPL), SVG-OM 2 (backfilling OM1)  . Left heart catheterization with coronary/graft angiogram N/A 08/22/2011    Procedure: LEFT HEART CATHETERIZATION WITH Beatrix Fetters;  Surgeon: Craig Man, MD;  Location: [facility] CATH LAB;  Service: Cardiovascular;  Laterality: N/A;  . Cardiac catheterization N/A 08/04/2014    Procedure: Left Heart Cath and Cors/Grafts Angiography;  Surgeon: Craig Man, MD;  Location: [facility] CV LAB;  Service: Cardiovascular;  Laterality: N/A;    Prior to Admission medications   Medication Sig Start Date End Date Taking? Authorizing Provider  acetaminophen (TYLENOL) 500 MG tablet Take 1,000 mg by mouth every 6 (six) hours as needed for mild pain or headache.   Yes Historical Provider, MD  Ascorbic Acid (VITAMIN C)  1000 MG tablet Take 1,000 mg by mouth daily.   Yes Historical Provider, MD  aspirin EC 81 MG tablet Take 81 mg by mouth daily.   Yes Historical Provider, MD  atorvastatin (LIPITOR) 40 MG tablet Take 40 mg by mouth daily.   Yes Historical Provider, MD  brimonidine (ALPHAGAN P) 0.1 % SOLN Place 1 drop into the right eye 3 (three) times daily.    Yes Historical Provider, MD  Cholecalciferol (VITAMIN D-3) 1000 UNITS CAPS Take 1,000 Units by mouth daily.   Yes Historical Provider, MD  clopidogrel (PLAVIX) 75 MG tablet Take 1 tablet (75 mg total) by mouth daily. 10/24/12  Yes Craig Man, MD  doxazosin (CARDURA) 4 MG tablet Take 2 mg by mouth at bedtime.    Yes Historical Provider, MD  fluticasone (FLONASE) 50 MCG/ACT nasal spray Place 1 spray into  both nostrils daily. 11/17/14  Yes Historical Provider, MD  gabapentin (NEURONTIN) 300 MG capsule Take 300 mg by mouth 3 (three) times daily.   Yes Historical Provider, MD  glipiZIDE (GLUCOTROL) 2.5 mg TABS tablet Take 2.5 mg by mouth 2 (two) times daily before a meal.   Yes Historical Provider, MD  insulin aspart protamine- aspart (NOVOLOG MIX 70/30) (70-30) 100 UNIT/ML injection Inject 10 Units into the skin 2 (two) times daily with a meal.   Yes Historical Provider, MD  isosorbide mononitrate (IMDUR) 120 MG 24 hr tablet Take 1 tablet (120 mg total) by mouth daily. 09/17/14  Yes Barton Dubois, MD  lansoprazole (PREVACID) 15 MG capsule Take 15 mg by mouth daily at 12 noon.   Yes Historical Provider, MD  latanoprost (XALATAN) 0.005 % ophthalmic solution Place 1 drop into both eyes at bedtime.    Yes Historical Provider, MD  lisinopril (PRINIVIL,ZESTRIL) 40 MG tablet Take 0.5 tablets (20 mg total) by mouth daily. 08/04/14  Yes Shanker Kristeen Mans, MD  loratadine (CLARITIN) 10 MG tablet Take 10 mg by mouth daily as needed. Forb  Seasonal allergies   Yes Historical Provider, MD  Omega-3 Fatty Acids (FISH OIL) 300 MG CAPS Take 300 mg by mouth 2 (two) times daily.     Yes Historical Provider, MD  saccharomyces boulardii (FLORASTOR) 250 MG capsule Take 1 capsule (250 mg total) by mouth 2 (two) times daily. Patient taking differently: Take 250 mg by mouth daily.  08/04/14  Yes Shanker Kristeen Mans, MD  saw palmetto 160 MG capsule Take 480 mg by mouth daily.   Yes Historical Provider, MD  timolol (TIMOPTIC) 0.5 % ophthalmic solution Place 1 drop into the left eye 3 (three) times daily.    Yes Historical Provider, MD   Allergies  Allergen Reactions  . Iohexol Other (See Comments)    Intractable shaking.  . Contrast Media [Iodinated Diagnostic Agents]     Shivering & shaking. Pt reports takes antihistamine prior to procedures.  . Metformin And Related Diarrhea    Subsequently discontinued    Social History   Social History  . Marital Status: Married    Spouse Name: N/A  . Number of Children: N/A  . Years of Education: N/A   Social History Main Topics  . Smoking status: Former Smoker -- 3.00 packs/day for 30 years    Types: Cigarettes  . Smokeless tobacco: Not on file     Comment: quit smoking about 50 years ago  . Alcohol Use: No  . Drug Use: No  . Sexual Activity: Not on file   Other Topics Concern  . Not on file   Social History Narrative   He is married with one daughter. He has 2 grandchildren.   He does not smoke. He quit smoking in roughly 1970 after smoking 3 packs per day.   He is routinely at give him. It does not necessarily do routine exercise.    No family history on file.   Wt Readings from Last 3 Encounters:  12/09/14 139 lb 1.6 oz (63.095 kg)  09/16/14 132 lb 11.5 oz (60.2 kg)  09/01/14 134 lb (60.782 kg)    PHYSICAL EXAM BP 124/58 mmHg  Pulse 58  Ht 5\' 7"  (1.702 m)  Wt 139 lb 1.6 oz (63.095 kg)  BMI 21.78 kg/m2 General appearance: He is alert and oriented x2. A well-nourished and well-groomed. Answers questions appropriately. NAD. HEENT: [place]/AT, EOMI, MMM, anicteric sclera. Neck: no adenopathy, no carotid bruit, no  JVD and supple, symmetrical, trachea midline Lungs: clear to auscultation bilaterally, normal percussion bilaterally and Nonlabored, good air movement Heart: regular rate and rhythm, S1, S2 normal, no murmur, click, rub or gallop, normal apical impulse and Palpation along the surgical scar at shows a couple bumps down along the lower sternum. These are somewhat tender to palpation. Abdomen: soft, non-tender; bowel sounds normal; no masses, no organomegaly Extremities: extremities normal, atraumatic, no cyanosis or edema Pulses: 2+ and symmetric Neurologic: Grossly normal    Adult ECG Report  Not Checked    Other studies Reviewed: Additional studies/ records that were reviewed today include:  Recent Labs:  No results found for: CHOL, HDL, LDLCALC, LDLDIRECT, TRIG, CHOLHDL   ASSESSMENT / PLAN: Problem List Items Addressed This Visit    S/P CABG (coronary artery bypass graft): Initial CABGX3 1995 (LIMA-LAD, SVG-OM 2, SVG-RPDA) --> redo CABG x1 FreeLRad-LAD for a occluded LAD with atretic LIMA and failed attempted revascularization. (Chronic)   Peripheral arterial disease - left vertebral and subclavian artery stenosis status post PTA/stent (Chronic)    H/o LCA stent - now with L arm discomfort with motion.  ? Arm claudication --> check Upper Ext Dopplers.       Relevant Medications   atorvastatin (LIPITOR) 40 MG tablet   Hyperlipidemia with target LDL less than 70 (Chronic)    On Atorvastatin - changed from Simvastatin.   Labs followed by PCP.  - labs not available On Omega 3 FA.      Relevant Medications   atorvastatin (LIPITOR) 40 MG tablet   Essential hypertension (Chronic)    BP Stable. On ACE-I.        Relevant Medications   atorvastatin (LIPITOR) 40 MG tablet   CAD -> CABG x3 then Re-DO CABG x1 (LRad-dLAD) after atretic LIMA & LAD stent occlusion. (Chronic)    Not sure if his Sx are truly Anginal.  No PCI target by recent cath. On Plavix & ASA.  - can stop ASA. On  Statin & ACE-I.  No BB due to bradycardia.      Relevant Medications   atorvastatin (LIPITOR) 40 MG tablet   Bilateral arm pain - Primary    ? If L arm pain is related to claudication from PAD.  Has h/o of SCA stent --> recheck dopplers.      Relevant Orders   VAS [place] UPPER EXTREMITY ARTERIAL DUPLEX   Atherosclerosis of coronary artery bypass graft with other forms of angina pectoris ([place])    See Native CAD.      Relevant Medications   atorvastatin (LIPITOR) 40 MG tablet   Angina at rest [facility])    Somewhat atypical Sx.  No PCI options - ? If related tospasm.   No now on 120 mg Imdur.  Next option is CCB.      Relevant Medications   atorvastatin (LIPITOR) 40 MG tablet      Current medicines are reviewed at length with the patient today. (+/- concerns) The following changes have been made: Studies Ordered:   Upper Extr Duplex  Pls send labs from PCP  ROV 6 months  No orders of the defined types were placed in this encounter.      [name], M.D., M.S. Interventional Cardiologist   Pager # [phone]

## 2014-12-09 NOTE — Patient Instructions (Signed)
Your physician has requested that you have a  upper extremity arterial duplex. This test is an ultrasound of the arteries in the arms. It looks at arterial blood flow in arms. Allow one hour for  Upper Arterial scans. There are no restrictions or special instructions  Your physician wants you to follow-up in [place]. You will receive a reminder letter in the mail two months in advance. If you don't receive a letter, please call our office to schedule the follow-up appointment.  SEND LABS FROM PRIMARY OFFICE WHEN YOU HAVE THEM DONE  If you need a refill on your cardiac medications before your next appointment, please call your pharmacy.

## 2014-12-11 ENCOUNTER — Other Ambulatory Visit: Payer: Self-pay | Admitting: Cardiology

## 2014-12-11 ENCOUNTER — Encounter: Payer: Self-pay | Admitting: Cardiology

## 2014-12-11 DIAGNOSIS — E1165 Type 2 diabetes mellitus with hyperglycemia: Secondary | ICD-10-CM | POA: Diagnosis not present

## 2014-12-11 DIAGNOSIS — M79601 Pain in right arm: Secondary | ICD-10-CM | POA: Insufficient documentation

## 2014-12-11 DIAGNOSIS — M79602 Pain in left arm: Principal | ICD-10-CM

## 2014-12-11 DIAGNOSIS — Z23 Encounter for immunization: Secondary | ICD-10-CM | POA: Diagnosis not present

## 2014-12-11 DIAGNOSIS — I2510 Atherosclerotic heart disease of native coronary artery without angina pectoris: Secondary | ICD-10-CM | POA: Diagnosis not present

## 2014-12-11 DIAGNOSIS — K219 Gastro-esophageal reflux disease without esophagitis: Secondary | ICD-10-CM | POA: Diagnosis not present

## 2014-12-11 DIAGNOSIS — I10 Essential (primary) hypertension: Secondary | ICD-10-CM | POA: Diagnosis not present

## 2014-12-11 NOTE — Assessment & Plan Note (Signed)
BP Stable. On ACE-I.

## 2014-12-11 NOTE — Assessment & Plan Note (Signed)
H/o LCA stent - now with L arm discomfort with motion.  ? Arm claudication --> check Upper Ext Dopplers.

## 2014-12-11 NOTE — Assessment & Plan Note (Signed)
On Atorvastatin - changed from Simvastatin.   Labs followed by PCP.  - labs not available On Omega 3 FA.

## 2014-12-11 NOTE — Assessment & Plan Note (Signed)
Somewhat atypical Sx.  No PCI options - ? If related tospasm.   No now on 120 mg Imdur.  Next option is CCB.

## 2014-12-11 NOTE — Assessment & Plan Note (Signed)
Not sure if his Sx are truly Anginal.  No PCI target by recent cath. On Plavix & ASA.  - can stop ASA. On Statin & ACE-I.  No BB due to bradycardia.

## 2014-12-11 NOTE — Assessment & Plan Note (Signed)
?   If L arm pain is related to claudication from PAD.  Has h/o of SCA stent --> recheck dopplers.

## 2014-12-11 NOTE — Assessment & Plan Note (Signed)
See Native CAD.

## 2014-12-18 ENCOUNTER — Ambulatory Visit (HOSPITAL_COMMUNITY)
Admission: RE | Admit: 2014-12-18 | Discharge: 2014-12-18 | Disposition: A | Discharge status: Home / Self Care | Payer: Medicare Other | Source: Ambulatory Visit | Attending: Cardiology | Admitting: Cardiology

## 2014-12-18 DIAGNOSIS — I10 Essential (primary) hypertension: Secondary | ICD-10-CM | POA: Diagnosis not present

## 2014-12-18 DIAGNOSIS — E119 Type 2 diabetes mellitus without complications: Secondary | ICD-10-CM | POA: Diagnosis not present

## 2014-12-18 DIAGNOSIS — M79601 Pain in right arm: Secondary | ICD-10-CM | POA: Insufficient documentation

## 2014-12-18 DIAGNOSIS — E785 Hyperlipidemia, unspecified: Secondary | ICD-10-CM | POA: Diagnosis not present

## 2014-12-18 DIAGNOSIS — M79602 Pain in left arm: Secondary | ICD-10-CM | POA: Insufficient documentation

## 2014-12-18 DIAGNOSIS — I7789 Other specified disorders of arteries and arterioles: Secondary | ICD-10-CM | POA: Diagnosis not present

## 2014-12-18 DIAGNOSIS — M79603 Pain in arm, unspecified: Secondary | ICD-10-CM | POA: Diagnosis present

## 2014-12-25 NOTE — Progress Notes (Signed)
Quick Note:  UPPER EXTREMITY DOPPLER REPORT: (12/22/2014) No evidence of upper extremity arterial occlusive disease.   1-59% bilateral brachial artery disease, without focal stenosis. -- MILD TO MODERATE. Should not explain any symptoms.  All flow is triphasic, with equal amplitudes, bilaterally.  The left subclavian stent is patent.  HARDING, DAVID W  pls fwd to:PCP - Rory Percy, MD ______

## 2014-12-26 ENCOUNTER — Telehealth: Payer: Self-pay | Admitting: *Deleted

## 2014-12-26 NOTE — Telephone Encounter (Signed)
-----   Message from Leonie Man, MD sent at 12/25/2014  9:46 PM EST ----- UPPER EXTREMITY DOPPLER REPORT: (12/22/2014) No evidence of upper extremity arterial occlusive disease.   1-59% bilateral brachial artery disease, without focal stenosis. -- MILD TO MODERATE.  Should not explain any symptoms.  All flow is triphasic, with equal amplitudes, bilaterally.  The left subclavian stent is patent.  HARDING, DAVID W  pls fwd to:PCP - Rory Percy, MD

## 2014-12-26 NOTE — Telephone Encounter (Signed)
Spoke to patient.  Upper extremities doppler Result given . Verbalized understanding Faxed results to Dr Rory Percy

## 2014-12-31 DIAGNOSIS — G629 Polyneuropathy, unspecified: Secondary | ICD-10-CM | POA: Diagnosis not present

## 2015-02-18 DIAGNOSIS — E1165 Type 2 diabetes mellitus with hyperglycemia: Secondary | ICD-10-CM | POA: Diagnosis not present

## 2015-02-18 DIAGNOSIS — E11649 Type 2 diabetes mellitus with hypoglycemia without coma: Secondary | ICD-10-CM | POA: Diagnosis not present

## 2015-02-18 DIAGNOSIS — I2510 Atherosclerotic heart disease of native coronary artery without angina pectoris: Secondary | ICD-10-CM | POA: Diagnosis not present

## 2015-02-18 DIAGNOSIS — I10 Essential (primary) hypertension: Secondary | ICD-10-CM | POA: Diagnosis not present

## 2015-02-18 DIAGNOSIS — G629 Polyneuropathy, unspecified: Secondary | ICD-10-CM | POA: Diagnosis not present

## 2015-03-03 ENCOUNTER — Encounter: Payer: Self-pay | Admitting: Neurology

## 2015-03-03 ENCOUNTER — Ambulatory Visit (INDEPENDENT_AMBULATORY_CARE_PROVIDER_SITE_OTHER): Payer: Self-pay | Admitting: Neurology

## 2015-03-03 ENCOUNTER — Ambulatory Visit (INDEPENDENT_AMBULATORY_CARE_PROVIDER_SITE_OTHER): Payer: Medicare Other | Admitting: Neurology

## 2015-03-03 VITALS — BP 130/58 | HR 62 | Resp 16 | Ht 67.0 in | Wt 140.0 lb

## 2015-03-03 DIAGNOSIS — M79602 Pain in left arm: Secondary | ICD-10-CM

## 2015-03-03 DIAGNOSIS — E1169 Type 2 diabetes mellitus with other specified complication: Secondary | ICD-10-CM

## 2015-03-03 NOTE — Progress Notes (Signed)
Please refer to EMG and nerve conduction study procedure note. 

## 2015-03-03 NOTE — Procedures (Signed)
     HISTORY:  [name] is an 82 year old, with a history of numbness of the left arm with flexion and elevation that has been present for several years. He primarily notes this issue while talking on the telephone. He also has some achy pain in the medial aspect of the left elbow. He will also have issues when he works with this left arm above the head for 2 or 3 minutes. The patient denies any neck pain or pain down the arms. He is being evaluated for the above symptoms.  NERVE CONDUCTION STUDIES:  Nerve conduction studies were performed on both upper extremities. The distal motor latencies and motor amplitudes for the median and ulnar nerves were within normal limits. The F wave latencies and nerve conduction velocities for these nerves were also normal. The sensory latencies for the median and ulnar nerves were normal.   EMG STUDIES:  EMG study was performed on the left upper extremity:  The first dorsal interosseous muscle reveals 2 to 4 K units with full recruitment. No fibrillations or positive waves were noted. The abductor pollicis brevis muscle reveals 2 to 4 K units with full recruitment. No fibrillations or positive waves were noted. The extensor indicis proprius muscle reveals 1 to 3 K units with full recruitment. No fibrillations or positive waves were noted. The pronator teres muscle reveals 2 to 3 K units with full recruitment. No fibrillations or positive waves were noted. The biceps muscle reveals 1 to 2 K units with full recruitment. No fibrillations or positive waves were noted. The triceps muscle reveals 2 to 4 K units with full recruitment. No fibrillations or positive waves were noted. The anterior deltoid muscle reveals 2 to 3 K units with full recruitment. No fibrillations or positive waves were noted. The cervical paraspinal muscles were tested at 2 levels. No abnormalities of insertional activity were seen at either level tested. There was poor  relaxation.   IMPRESSION:  Nerve conduction studies done on both upper extremities were within normal limits. No evidence of a neuropathy is seen. EMG evaluation of the left upper extremity is unremarkable, without evidence of an overlying cervical radiculopathy.  Jill Alexanders MD 03/03/2015 12:15 PM  [facility] [address] [place], [place] [address]  Phone [phone] Fax [phone]

## 2015-03-06 DIAGNOSIS — J069 Acute upper respiratory infection, unspecified: Secondary | ICD-10-CM | POA: Diagnosis not present

## 2015-03-06 DIAGNOSIS — R05 Cough: Secondary | ICD-10-CM | POA: Diagnosis not present

## 2015-03-18 ENCOUNTER — Telehealth: Payer: Self-pay | Admitting: Cardiology

## 2015-03-18 DIAGNOSIS — H1812 Bullous keratopathy, left eye: Secondary | ICD-10-CM | POA: Diagnosis not present

## 2015-03-18 NOTE — Telephone Encounter (Signed)
Patient at facility for corneal transplant and needs clearance b/c of chronic cp.  Office wants to know if they can talk to Dr. Ellyn Hack and take a verbal order for clearance to do light sedation.

## 2015-03-18 NOTE — Telephone Encounter (Signed)
Dr. Ellyn Hack s/w representative from the office.

## 2015-04-14 DIAGNOSIS — D649 Anemia, unspecified: Secondary | ICD-10-CM | POA: Diagnosis not present

## 2015-04-14 DIAGNOSIS — I10 Essential (primary) hypertension: Secondary | ICD-10-CM | POA: Diagnosis not present

## 2015-04-14 DIAGNOSIS — R1013 Epigastric pain: Secondary | ICD-10-CM | POA: Diagnosis not present

## 2015-04-14 DIAGNOSIS — E1169 Type 2 diabetes mellitus with other specified complication: Secondary | ICD-10-CM | POA: Diagnosis not present

## 2015-04-14 DIAGNOSIS — R42 Dizziness and giddiness: Secondary | ICD-10-CM | POA: Diagnosis not present

## 2015-04-20 ENCOUNTER — Emergency Department (HOSPITAL_COMMUNITY)
Admission: EM | Admit: 2015-04-20 | Discharge: 2015-04-20 | Disposition: A | Discharge status: Home / Self Care | Payer: Medicare Other | Attending: Emergency Medicine | Admitting: Emergency Medicine

## 2015-04-20 DIAGNOSIS — B961 Klebsiella pneumoniae [K. pneumoniae] as the cause of diseases classified elsewhere: Secondary | ICD-10-CM | POA: Diagnosis not present

## 2015-04-20 DIAGNOSIS — Z794 Long term (current) use of insulin: Secondary | ICD-10-CM | POA: Diagnosis not present

## 2015-04-20 DIAGNOSIS — Z951 Presence of aortocoronary bypass graft: Secondary | ICD-10-CM | POA: Insufficient documentation

## 2015-04-20 DIAGNOSIS — R300 Dysuria: Secondary | ICD-10-CM | POA: Diagnosis not present

## 2015-04-20 DIAGNOSIS — I10 Essential (primary) hypertension: Secondary | ICD-10-CM | POA: Diagnosis not present

## 2015-04-20 DIAGNOSIS — Z79899 Other long term (current) drug therapy: Secondary | ICD-10-CM | POA: Insufficient documentation

## 2015-04-20 DIAGNOSIS — R1030 Lower abdominal pain, unspecified: Secondary | ICD-10-CM | POA: Insufficient documentation

## 2015-04-20 DIAGNOSIS — Z9889 Other specified postprocedural states: Secondary | ICD-10-CM | POA: Diagnosis not present

## 2015-04-20 DIAGNOSIS — E785 Hyperlipidemia, unspecified: Secondary | ICD-10-CM | POA: Diagnosis not present

## 2015-04-20 DIAGNOSIS — R531 Weakness: Secondary | ICD-10-CM | POA: Diagnosis not present

## 2015-04-20 DIAGNOSIS — H409 Unspecified glaucoma: Secondary | ICD-10-CM | POA: Insufficient documentation

## 2015-04-20 DIAGNOSIS — Z7982 Long term (current) use of aspirin: Secondary | ICD-10-CM | POA: Insufficient documentation

## 2015-04-20 DIAGNOSIS — Z87891 Personal history of nicotine dependence: Secondary | ICD-10-CM | POA: Insufficient documentation

## 2015-04-20 DIAGNOSIS — N390 Urinary tract infection, site not specified: Secondary | ICD-10-CM

## 2015-04-20 DIAGNOSIS — E119 Type 2 diabetes mellitus without complications: Secondary | ICD-10-CM | POA: Insufficient documentation

## 2015-04-20 DIAGNOSIS — R0682 Tachypnea, not elsewhere classified: Secondary | ICD-10-CM | POA: Diagnosis not present

## 2015-04-20 DIAGNOSIS — R509 Fever, unspecified: Secondary | ICD-10-CM | POA: Diagnosis not present

## 2015-04-20 DIAGNOSIS — Z7952 Long term (current) use of systemic steroids: Secondary | ICD-10-CM | POA: Insufficient documentation

## 2015-04-20 DIAGNOSIS — Z9861 Coronary angioplasty status: Secondary | ICD-10-CM | POA: Diagnosis not present

## 2015-04-20 DIAGNOSIS — Z7902 Long term (current) use of antithrombotics/antiplatelets: Secondary | ICD-10-CM | POA: Diagnosis not present

## 2015-04-20 DIAGNOSIS — I2510 Atherosclerotic heart disease of native coronary artery without angina pectoris: Secondary | ICD-10-CM | POA: Insufficient documentation

## 2015-04-20 DIAGNOSIS — I959 Hypotension, unspecified: Secondary | ICD-10-CM | POA: Diagnosis not present

## 2015-04-20 DIAGNOSIS — R404 Transient alteration of awareness: Secondary | ICD-10-CM | POA: Diagnosis not present

## 2015-04-20 LAB — URINE MICROSCOPIC-ADD ON

## 2015-04-20 LAB — URINALYSIS, ROUTINE W REFLEX MICROSCOPIC
BILIRUBIN URINE: NEGATIVE
Glucose, UA: NEGATIVE mg/dL
HGB URINE DIPSTICK: NEGATIVE
Ketones, ur: 15 mg/dL — AB
Nitrite: NEGATIVE
PH: 6.5 (ref 5.0–8.0)
Protein, ur: 30 mg/dL — AB
SPECIFIC GRAVITY, URINE: 1.019 (ref 1.005–1.030)

## 2015-04-20 LAB — CBC WITH DIFFERENTIAL/PLATELET
BASOS ABS: 0 10*3/uL (ref 0.0–0.1)
BASOS PCT: 0 %
EOS ABS: 0 10*3/uL (ref 0.0–0.7)
EOS PCT: 0 %
HCT: 29.6 % — ABNORMAL LOW (ref 39.0–52.0)
Hemoglobin: 10.5 g/dL — ABNORMAL LOW (ref 13.0–17.0)
Lymphocytes Relative: 7 %
Lymphs Abs: 1.1 10*3/uL (ref 0.7–4.0)
MCH: 30.4 pg (ref 26.0–34.0)
MCHC: 35.5 g/dL (ref 30.0–36.0)
MCV: 85.8 fL (ref 78.0–100.0)
MONO ABS: 1.7 10*3/uL — AB (ref 0.1–1.0)
Monocytes Relative: 11 %
Neutro Abs: 11.9 10*3/uL — ABNORMAL HIGH (ref 1.7–7.7)
Neutrophils Relative %: 82 %
PLATELETS: 175 10*3/uL (ref 150–400)
RBC: 3.45 MIL/uL — ABNORMAL LOW (ref 4.22–5.81)
RDW: 12.9 % (ref 11.5–15.5)
WBC: 14.7 10*3/uL — ABNORMAL HIGH (ref 4.0–10.5)

## 2015-04-20 LAB — BASIC METABOLIC PANEL
ANION GAP: 8 (ref 5–15)
BUN: 24 mg/dL — ABNORMAL HIGH (ref 6–20)
CALCIUM: 8.3 mg/dL — AB (ref 8.9–10.3)
CO2: 22 mmol/L (ref 22–32)
Chloride: 103 mmol/L (ref 101–111)
Creatinine, Ser: 1.18 mg/dL (ref 0.61–1.24)
GFR, EST NON AFRICAN AMERICAN: 56 mL/min — AB (ref >60)
GLUCOSE: 185 mg/dL — AB (ref 65–99)
Potassium: 3.4 mmol/L — ABNORMAL LOW (ref 3.5–5.1)
SODIUM: 133 mmol/L — AB (ref 135–145)

## 2015-04-20 MED ORDER — POTASSIUM CHLORIDE CRYS ER 20 MEQ PO TBCR
40.0000 meq | EXTENDED_RELEASE_TABLET | Freq: Once | ORAL | Status: AC
  Administered 2015-04-20: 40 meq via ORAL
  Filled 2015-04-20: qty 2

## 2015-04-20 MED ORDER — CEPHALEXIN 500 MG PO CAPS: 500.0000 mg | ORAL_CAPSULE | Freq: Two times a day (BID) | ORAL | Status: DC

## 2015-04-20 NOTE — Discharge Instructions (Signed)
Your evaluation today reveals a urinary tract infection.  We treat this with antibiotics.  Please take Keflex twice daily for the next 7 days.  You may take ibuprofen or tylenol for fever or pain.  Follow up with your regular doctor in 2 days for recheck.  Your urine sample has been sent for culture, and if there needs to be any changes in antibiotics you will receive a phone call.  Urinary Tract Infection Urinary tract infections (UTIs) can develop anywhere along your urinary tract. Your urinary tract is your body's drainage system for removing wastes and extra water. Your urinary tract includes two kidneys, two ureters, a bladder, and a urethra. Your kidneys are a pair of bean-shaped organs. Each kidney is about the size of your fist. They are located below your ribs, one on each side of your spine. CAUSES Infections are caused by microbes, which are microscopic organisms, including fungi, viruses, and bacteria. These organisms are so small that they can only be seen through a microscope. Bacteria are the microbes that most commonly cause UTIs. SYMPTOMS  Symptoms of UTIs may vary by age and gender of the patient and by the location of the infection. Symptoms in young women typically include a frequent and intense urge to urinate and a painful, burning feeling in the bladder or urethra during urination. Older women and men are more likely to be tired, shaky, and weak and have muscle aches and abdominal pain. A fever may mean the infection is in your kidneys. Other symptoms of a kidney infection include pain in your back or sides below the ribs, nausea, and vomiting. DIAGNOSIS To diagnose a UTI, your caregiver will ask you about your symptoms. Your caregiver will also ask you to provide a urine sample. The urine sample will be tested for bacteria and white blood cells. White blood cells are made by your body to help fight infection. TREATMENT  Typically, UTIs can be treated with medication. Because most  UTIs are caused by a bacterial infection, they usually can be treated with the use of antibiotics. The choice of antibiotic and length of treatment depend on your symptoms and the type of bacteria causing your infection. HOME CARE INSTRUCTIONS  If you were prescribed antibiotics, take them exactly as your caregiver instructs you. Finish the medication even if you feel better after you have only taken some of the medication.  Drink enough water and fluids to keep your urine clear or pale yellow.  Avoid caffeine, tea, and carbonated beverages. They tend to irritate your bladder.  Empty your bladder often. Avoid holding urine for long periods of time.  Empty your bladder before and after sexual intercourse.  After a bowel movement, women should cleanse from front to back. Use each tissue only once. SEEK MEDICAL CARE IF:   You have back pain.  You develop a fever.  Your symptoms do not begin to resolve within 3 days. SEEK IMMEDIATE MEDICAL CARE IF:   You have severe back pain or lower abdominal pain.  You develop chills.  You have nausea or vomiting.  You have continued burning or discomfort with urination. MAKE SURE YOU:   Understand these instructions.  Will watch your condition.  Will get help right away if you are not doing well or get worse.   This information is not intended to replace advice given to you by your health care provider. Make sure you discuss any questions you have with your health care provider.   Document Released: 09/29/2004  Document Revised: 09/10/2014 Document Reviewed: 01/28/2011 Elsevier Interactive Patient Education [employer].

## 2015-04-20 NOTE — ED Notes (Signed)
Pt c/o fever and burning with urination for the past 2 days. Pt given 1g of tylenol and 400cc NS just PTA.

## 2015-04-20 NOTE — ED Provider Notes (Signed)
CSN: [id number]     Arrival date & time 04/20/15  1335 History   First MD Initiated Contact with Patient 04/20/15 1342     Chief Complaint  Patient presents with  . Fever  . Dysuria   HPI  Mr. Nodarse is an 82 year old male with past medical history of coronary artery disease, hypertension, diabetes presenting with fever and dysuria. Onset of symptoms was 2-3 days ago. He reports temperatures at home up to 102. He has taken Tylenol for his fever which appropriately reduced it. He is complaining of burning on urination and mild suprapubic abdominal tenderness. He has not taken any over-the-counter medications for this. He denies associated nausea, vomiting, diarrhea, hematuria, flank pain, penile discharge or testicular pain. He reports a history of urinary tract infection a few years ago and states this feels similar. He has no other complaints today. He denies dizziness or syncope. His wife is at bedside and states he is at his mental baseline. He notes that the pressure taken from his left arm is typically lower due to artery grafts for his CABGs.  Past Medical History  Diagnosis Date  . CAD in native artery 1995    CABG x 3 - LIMA-LAD, SVG-OM2, SVG-rPDA  . CAD (coronary artery disease) of artery bypass graft 2000    PCI x 2 - ostial & prox-mid LAD (BMS) for atretic LIMA-LAD  . S/P CABG x 3 1995   . S/P Redo CABG x 1 2001    L Radial-LAD after 2 failed attempts @ LIMA-LAD PTCA; LAD stents 100% occluded  . CAD (coronary artery disease)     1995 - CABG x3; redo in CABG x 1 2001 Free Radial to LAD; All grafts patent by Cath 08/2011  . Hypertension, essential   . Hyperlipidemia LDL goal <70   . DM (diabetes mellitus) type II controlled peripheral vascular disorder ([place]) 2002    Left subclavian and left vertebral artery stenoses  . Asymptomatic stenosis of left vertebral artery 2002, 2005    Status post PTA/stent with redo; normal antegrade flow on dopplers 09/2013  . Stenosis of left subclavian  artery 11/2003    Status post PTA/stent --> < 50 % stenosis by Dopplers 09/2013  . Glaucoma    Past Surgical History  Procedure Laterality Date  . Coronary artery bypass graft  1995     LIMA-LAD, SVG-OM2, SVG-rPDA  . Coronary stent placement  1995-2000    2 BMS stents to osital-proximal & proximal-mid LAD; because of atretic LIMA-LDA  . Coronary angioplasty  April and May 2001    After Both LAD stents occluded - PTCA of anastomatic LIMA-LAD lesion -- Unsuccessful.  . Coronary artery bypass graft  June 2001    Dr. Lucianne Lei Trigt: Redo LAD grafting with free Left Radial-distal LAD  . Shoulder open rotator cuff repair  October 2004    Dr. Noemi Chapel  . Sp extracran vert or thor carotid stent Left October 2002; November 2005    Left Vertebral stent placed in October 2002 (Dr. Patrecia Pour); redo PCI in 2005  . Subclavian artery stent Left November 2005    Dr. Gwenlyn Found  . Cardiac catheterization  November 2006; August 13    Known occluded LIMA-LAD and ostial LAD. Moderate to severe proximal circumflex and RCA disease. Widely patent freeLRAD-dLAD, as well as SVG-RPDA (backfilling RPL), SVG-OM 2 (backfilling OM1)  . Left heart catheterization with coronary/graft angiogram [name] 08/22/2011    Procedure: LEFT HEART CATHETERIZATION WITH Beatrix Fetters;  Surgeon: Leonie Green  Ellyn Hack, MD;  Location: [facility] CATH LAB;  Service: Cardiovascular;  Laterality: [name];  . Cardiac catheterization [name] 08/04/2014    Procedure: Left Heart Cath and Cors/Grafts Angiography;  Surgeon: Leonie Man, MD;  Location: [place] CV LAB;  Service: Cardiovascular;  Laterality: [name];   No family history on file. Social History  Substance Use Topics  . Smoking status: Former Smoker -- 3.00 packs/day for 30 years    Types: Cigarettes  . Smokeless tobacco: Not on file     Comment: quit smoking about 50 years ago  . Alcohol Use: No    Review of Systems  All other systems reviewed and are negative.     Allergies  Iohexol;  Contrast media; and Metformin and related  Home Medications   Prior to Admission medications   Medication Sig Start Date End Date Taking? Authorizing Provider  acetaminophen (TYLENOL) 500 MG tablet Take 1,000 mg by mouth every 6 (six) hours as needed for mild pain or headache.   Yes Historical Provider, MD  Ascorbic Acid (VITAMIN C) 1000 MG tablet Take 1,000 mg by mouth daily.   Yes Historical Provider, MD  aspirin EC 81 MG tablet Take 81 mg by mouth daily.   Yes Historical Provider, MD  atorvastatin (LIPITOR) 40 MG tablet Take 40 mg by mouth daily.   Yes Historical Provider, MD  brimonidine (ALPHAGAN P) 0.1 % SOLN Place 1 drop into the right eye 3 (three) times daily.    Yes Historical Provider, MD  Cholecalciferol (VITAMIN D-3) 1000 UNITS CAPS Take 1,000 Units by mouth daily.   Yes Historical Provider, MD  clopidogrel (PLAVIX) 75 MG tablet Take 1 tablet (75 mg total) by mouth daily. 10/24/12  Yes Leonie Man, MD  doxazosin (CARDURA) 4 MG tablet Take 2 mg by mouth at bedtime.    Yes Historical Provider, MD  fluticasone (FLONASE) 50 MCG/ACT nasal spray Place 1 spray into both nostrils daily. 11/17/14  Yes Historical Provider, MD  gabapentin (NEURONTIN) 300 MG capsule Take 300 mg by mouth 3 (three) times daily.   Yes Historical Provider, MD  glipiZIDE (GLUCOTROL XL) 10 MG 24 hr tablet Take 10 mg by mouth 2 (two) times daily. 03/31/15  Yes Historical Provider, MD  GLOBAL INJECT EASE LANCETS 30G MISC DIABETIC CLUB - USE TO CHECK BLOOD GLUCOSE LEVELS 02/23/15  Yes Historical Provider, MD  glucose blood test strip DIABETIC CLUB - USE TO CHECK BLOOD GLUCOSE LEVELS 02/23/15  Yes Historical Provider, MD  insulin aspart protamine- aspart (NOVOLOG MIX 70/30) (70-30) 100 UNIT/ML injection Inject 10 Units into the skin 2 (two) times daily with a meal.   Yes Historical Provider, MD  isosorbide mononitrate (IMDUR) 60 MG 24 hr tablet Take 90 mg by mouth daily. 03/31/15  Yes Historical Provider, MD   lansoprazole (PREVACID) 15 MG capsule Take 15 mg by mouth daily at 12 noon.   Yes Historical Provider, MD  latanoprost (XALATAN) 0.005 % ophthalmic solution Place 1 drop into both eyes at bedtime.    Yes Historical Provider, MD  lisinopril (PRINIVIL,ZESTRIL) 40 MG tablet Take 0.5 tablets (20 mg total) by mouth daily. 08/04/14  Yes Shanker Kristeen Mans, MD  loratadine (CLARITIN) 10 MG tablet Take 10 mg by mouth daily as needed. Forb  Seasonal allergies   Yes Historical Provider, MD  Omega-3 Fatty Acids (FISH OIL) 300 MG CAPS Take 300 mg by mouth 2 (two) times daily.    Yes Historical Provider, MD  saccharomyces boulardii (FLORASTOR) 250 MG capsule Take  1 capsule (250 mg total) by mouth 2 (two) times daily. Patient taking differently: Take 250 mg by mouth daily.  08/04/14  Yes Shanker Kristeen Mans, MD  saw palmetto 160 MG capsule Take 480 mg by mouth daily.   Yes Historical Provider, MD  timolol (TIMOPTIC) 0.5 % ophthalmic solution Place 1 drop into the left eye 3 (three) times daily.    Yes Historical Provider, MD  isosorbide mononitrate (IMDUR) 120 MG 24 hr tablet Take 1 tablet (120 mg total) by mouth daily. Patient not taking: Reported on 04/20/2015 09/17/14   Barton Dubois, MD   BP 124/51 mmHg  Pulse 66  Temp(Src) 98.2 F (36.8 C) (Oral)  Resp 14  Ht 5\' 4"  (1.626 m)  Wt 63.504 kg  BMI 24.02 kg/m2  SpO2 97% Physical Exam  Constitutional: He appears well-developed and well-nourished. No distress.  Nontoxic-appearing. Blood pressure noted to be 92/50 from left arm. Blood pressure on right arm is on 117/55.  HENT:  Head: Normocephalic and atraumatic.  Eyes: Conjunctivae are normal. Right eye exhibits no discharge. Left eye exhibits no discharge. No scleral icterus.  Neck: Normal range of motion. Neck supple.  Cardiovascular: Normal rate and regular rhythm.   Pulmonary/Chest: Effort normal. No respiratory distress.  Abdominal: Soft. Bowel sounds are normal. He exhibits no distension. There is  tenderness in the suprapubic area. There is no rebound and no guarding.  Musculoskeletal: Normal range of motion.  Neurological: He is alert. Coordination normal.  Skin: Skin is warm and dry.  Psychiatric: He has a normal mood and affect. His behavior is normal.  Nursing note and vitals reviewed.   ED Course  Procedures (including critical care time) Labs Review Labs Reviewed  CBC WITH DIFFERENTIAL/PLATELET - Abnormal; Notable for the following:    WBC 14.7 (*)    RBC 3.45 (*)    Hemoglobin 10.5 (*)    HCT 29.6 (*)    Neutro Abs 11.9 (*)    Monocytes Absolute 1.7 (*)    All other components within normal limits  BASIC METABOLIC PANEL - Abnormal; Notable for the following:    Sodium 133 (*)    Potassium 3.4 (*)    Glucose, Bld 185 (*)    BUN 24 (*)    Calcium 8.3 (*)    GFR calc non Af Amer 56 (*)    All other components within normal limits  URINALYSIS, ROUTINE W REFLEX MICROSCOPIC (NOT AT [facility])    Imaging Review No results found. I have personally reviewed and evaluated these images and lab results as part of my medical decision-making.   EKG Interpretation None      MDM   Final diagnoses:  Dysuria   82 year old male presenting with fever, dysuria and suprapubic tenderness times 2-3 days. Temperature 100 upon presentation. Patient had received 1 g Tylenol present EMS. Patient is hemodynamically stable and nontoxic appearing. Alert and oriented 4. Mild suprapubic tenderness to palpation without peritoneal signs. Leukocytosis to 14.7. Hemoglobin at 10.5 which appears slightly lower from his baseline of 11. BUN elevated to 24 inches above his baseline. Creatinine 1.18. Very mild hyponatremia and hypokalemia. Given IV fluid bolus in ED and potassium repleted. Urinalysis pending. Signed out to oncoming provider to disposition after UA results.     Josephina Gip, PA-C 04/20/15 [place], DO 04/20/15 1745

## 2015-04-20 NOTE — ED Provider Notes (Signed)
4 PM: Care assumed from [facility], PA-C at shift change.  Patient presents with suprapubic tenderness, dysuria, and fever.  Initial BP noted to be 92/50; however, hx of graft in this arm.  Right arm BP 117/55.  Vitals have remained stable. Has remained afebrile after 1g Tylenol, presented with temperature 100.  Patient given IVF and K.  UA remarkable for moderate leukocytes, TNTC WBCs, and many bacteria. Urine culture pending.  Plan to discharge home with Keflex.  Evaluation does not show pathology requiring ongoing emergent intervention or admission. Pt is hemodynamically stable and mentating appropriately. Discussed findings/results and plan with patient/guardian, who agrees with plan. All questions answered. Return precautions discussed and outpatient follow up given.    Gloriann Loan, PA-C 04/20/15 [place], DO 04/20/15 1745

## 2015-04-20 NOTE — ED Notes (Signed)
Bed: [facility] Expected date:  Expected time:  Means of arrival:  Comments: EMS- fever

## 2015-04-22 LAB — URINE CULTURE: Culture: >=100000 — AB

## 2015-04-23 ENCOUNTER — Telehealth: Payer: Self-pay | Admitting: *Deleted

## 2015-04-23 NOTE — ED Notes (Signed)
Post ED Visit - Positive Culture Follow-up  Culture report reviewed by antimicrobial stewardship pharmacist:  []  Elenor Quinones, Pharm.D. []  Heide Guile, Pharm.D., BCPS []  Parks Neptune, Pharm.D. []  Alycia Rossetti, Pharm.D., BCPS []  [place], Pharm.D., BCPS, AAHIVP [x]  Legrand Como, Pharm.D., BCPS, AAHIVP []  Milus Glazier, Pharm.D. []  Stephens November, Pharm.D.  Positive urine culture Treated with Cephalexin, organism sensitive to the same and no further patient follow-up is required at this time.  Harlon [facility] 04/23/2015, 11:27 AM

## 2015-05-01 DIAGNOSIS — N390 Urinary tract infection, site not specified: Secondary | ICD-10-CM | POA: Diagnosis not present

## 2015-05-05 DIAGNOSIS — N390 Urinary tract infection, site not specified: Secondary | ICD-10-CM | POA: Diagnosis not present

## 2015-05-05 DIAGNOSIS — R509 Fever, unspecified: Secondary | ICD-10-CM | POA: Diagnosis not present

## 2015-06-04 DIAGNOSIS — I2510 Atherosclerotic heart disease of native coronary artery without angina pectoris: Secondary | ICD-10-CM | POA: Diagnosis not present

## 2015-06-04 DIAGNOSIS — E1165 Type 2 diabetes mellitus with hyperglycemia: Secondary | ICD-10-CM | POA: Diagnosis not present

## 2015-06-04 DIAGNOSIS — K219 Gastro-esophageal reflux disease without esophagitis: Secondary | ICD-10-CM | POA: Diagnosis not present

## 2015-06-04 DIAGNOSIS — I10 Essential (primary) hypertension: Secondary | ICD-10-CM | POA: Diagnosis not present

## 2015-06-08 DIAGNOSIS — I10 Essential (primary) hypertension: Secondary | ICD-10-CM | POA: Diagnosis not present

## 2015-06-08 DIAGNOSIS — E1165 Type 2 diabetes mellitus with hyperglycemia: Secondary | ICD-10-CM | POA: Diagnosis not present

## 2015-06-23 DIAGNOSIS — Z0001 Encounter for general adult medical examination with abnormal findings: Secondary | ICD-10-CM | POA: Diagnosis not present

## 2015-06-23 DIAGNOSIS — I2510 Atherosclerotic heart disease of native coronary artery without angina pectoris: Secondary | ICD-10-CM | POA: Diagnosis not present

## 2015-07-09 ENCOUNTER — Telehealth: Payer: Self-pay | Admitting: Cardiology

## 2015-07-09 NOTE — Telephone Encounter (Signed)
1. Type of surgery: corneal transplant w/local anesthesia with light sedation 2. Date of surgery: July 15, 2015 3. Surgeon: Dr. Geronimo Running @ [facility] 4. Medications that need to be held & how long: none specified  5. Fax and/or Phone: ([phone]

## 2015-07-13 NOTE — Telephone Encounter (Signed)
Clearance routed via EPIC 

## 2015-07-13 NOTE — Telephone Encounter (Signed)
Low risk for corneal transplant- if necessary, can hold plavix for 5 days prior to procedure.  Dr. Debara Pickett ( for Dr. Ellyn Hack)

## 2015-07-15 DIAGNOSIS — T86841 Corneal transplant failure: Secondary | ICD-10-CM | POA: Diagnosis not present

## 2015-08-30 DIAGNOSIS — E1140 Type 2 diabetes mellitus with diabetic neuropathy, unspecified: Secondary | ICD-10-CM | POA: Diagnosis not present

## 2015-08-30 DIAGNOSIS — Z794 Long term (current) use of insulin: Secondary | ICD-10-CM | POA: Diagnosis not present

## 2015-08-30 DIAGNOSIS — R55 Syncope and collapse: Secondary | ICD-10-CM | POA: Diagnosis not present

## 2015-08-30 DIAGNOSIS — E876 Hypokalemia: Secondary | ICD-10-CM | POA: Diagnosis not present

## 2015-08-30 DIAGNOSIS — R001 Bradycardia, unspecified: Secondary | ICD-10-CM | POA: Diagnosis not present

## 2015-08-30 DIAGNOSIS — R402311 Coma scale, best motor response, none, in the field [EMT or ambulance]: Secondary | ICD-10-CM | POA: Diagnosis not present

## 2015-08-30 DIAGNOSIS — I10 Essential (primary) hypertension: Secondary | ICD-10-CM | POA: Diagnosis not present

## 2015-08-30 DIAGNOSIS — E11649 Type 2 diabetes mellitus with hypoglycemia without coma: Secondary | ICD-10-CM | POA: Diagnosis not present

## 2015-08-30 DIAGNOSIS — Z7984 Long term (current) use of oral hypoglycemic drugs: Secondary | ICD-10-CM | POA: Diagnosis not present

## 2015-08-30 DIAGNOSIS — I509 Heart failure, unspecified: Secondary | ICD-10-CM | POA: Diagnosis not present

## 2015-08-30 DIAGNOSIS — I2510 Atherosclerotic heart disease of native coronary artery without angina pectoris: Secondary | ICD-10-CM | POA: Diagnosis not present

## 2015-08-30 DIAGNOSIS — E162 Hypoglycemia, unspecified: Secondary | ICD-10-CM | POA: Diagnosis not present

## 2015-08-30 DIAGNOSIS — I110 Hypertensive heart disease with heart failure: Secondary | ICD-10-CM | POA: Diagnosis not present

## 2015-09-23 ENCOUNTER — Other Ambulatory Visit: Payer: Self-pay | Admitting: Cardiology

## 2015-09-26 DIAGNOSIS — Z6821 Body mass index (BMI) 21.0-21.9, adult: Secondary | ICD-10-CM | POA: Diagnosis not present

## 2015-09-26 DIAGNOSIS — E1165 Type 2 diabetes mellitus with hyperglycemia: Secondary | ICD-10-CM | POA: Diagnosis not present

## 2015-09-30 DIAGNOSIS — Z6821 Body mass index (BMI) 21.0-21.9, adult: Secondary | ICD-10-CM | POA: Diagnosis not present

## 2015-09-30 DIAGNOSIS — E162 Hypoglycemia, unspecified: Secondary | ICD-10-CM | POA: Diagnosis not present

## 2015-09-30 DIAGNOSIS — T68XXXS Hypothermia, sequela: Secondary | ICD-10-CM | POA: Diagnosis not present

## 2015-10-16 ENCOUNTER — Encounter (HOSPITAL_COMMUNITY): Payer: Self-pay | Admitting: Emergency Medicine

## 2015-10-16 ENCOUNTER — Emergency Department (HOSPITAL_COMMUNITY)
Admission: EM | Admit: 2015-10-16 | Discharge: 2015-10-16 | Disposition: A | Discharge status: Home / Self Care | Payer: Medicare Other | Attending: Emergency Medicine | Admitting: Emergency Medicine

## 2015-10-16 DIAGNOSIS — R739 Hyperglycemia, unspecified: Secondary | ICD-10-CM

## 2015-10-16 DIAGNOSIS — Z794 Long term (current) use of insulin: Secondary | ICD-10-CM | POA: Diagnosis not present

## 2015-10-16 DIAGNOSIS — Z7982 Long term (current) use of aspirin: Secondary | ICD-10-CM | POA: Insufficient documentation

## 2015-10-16 DIAGNOSIS — I10 Essential (primary) hypertension: Secondary | ICD-10-CM | POA: Diagnosis not present

## 2015-10-16 DIAGNOSIS — Z87891 Personal history of nicotine dependence: Secondary | ICD-10-CM | POA: Diagnosis not present

## 2015-10-16 DIAGNOSIS — E1165 Type 2 diabetes mellitus with hyperglycemia: Secondary | ICD-10-CM | POA: Insufficient documentation

## 2015-10-16 DIAGNOSIS — I2510 Atherosclerotic heart disease of native coronary artery without angina pectoris: Secondary | ICD-10-CM | POA: Diagnosis not present

## 2015-10-16 DIAGNOSIS — Z79899 Other long term (current) drug therapy: Secondary | ICD-10-CM | POA: Diagnosis not present

## 2015-10-16 DIAGNOSIS — Z7984 Long term (current) use of oral hypoglycemic drugs: Secondary | ICD-10-CM | POA: Insufficient documentation

## 2015-10-16 LAB — CBC
HEMATOCRIT: 37.5 % — AB (ref 39.0–52.0)
HEMOGLOBIN: 13.2 g/dL (ref 13.0–17.0)
MCH: 30.3 pg (ref 26.0–34.0)
MCHC: 35.2 g/dL (ref 30.0–36.0)
MCV: 86.2 fL (ref 78.0–100.0)
Platelets: 178 10*3/uL (ref 150–400)
RBC: 4.35 MIL/uL (ref 4.22–5.81)
RDW: 12.2 % (ref 11.5–15.5)
WBC: 5.5 10*3/uL (ref 4.0–10.5)

## 2015-10-16 LAB — URINALYSIS, ROUTINE W REFLEX MICROSCOPIC
BILIRUBIN URINE: NEGATIVE
Glucose, UA: >1000 mg/dL — AB
HGB URINE DIPSTICK: NEGATIVE
KETONES UR: 15 mg/dL — AB
Leukocytes, UA: NEGATIVE
NITRITE: NEGATIVE
Protein, ur: NEGATIVE mg/dL
SPECIFIC GRAVITY, URINE: 1.01 (ref 1.005–1.030)
pH: 5.5 (ref 5.0–8.0)

## 2015-10-16 LAB — BASIC METABOLIC PANEL
Anion gap: 8 (ref 5–15)
BUN: 23 mg/dL — AB (ref 6–20)
CALCIUM: 9.1 mg/dL (ref 8.9–10.3)
CO2: 26 mmol/L (ref 22–32)
Chloride: 96 mmol/L — ABNORMAL LOW (ref 101–111)
Creatinine, Ser: 1.27 mg/dL — ABNORMAL HIGH (ref 0.61–1.24)
GFR calc Af Amer: 59 mL/min — ABNORMAL LOW (ref >60)
GFR, EST NON AFRICAN AMERICAN: 51 mL/min — AB (ref >60)
GLUCOSE: 461 mg/dL — AB (ref 65–99)
Potassium: 4.2 mmol/L (ref 3.5–5.1)
Sodium: 130 mmol/L — ABNORMAL LOW (ref 135–145)

## 2015-10-16 LAB — URINE MICROSCOPIC-ADD ON
RBC / HPF: NONE SEEN RBC/hpf (ref 0–5)
SQUAMOUS EPITHELIAL / LPF: NONE SEEN

## 2015-10-16 LAB — CBG MONITORING, ED
GLUCOSE-CAPILLARY: 316 mg/dL — AB (ref 65–99)
Glucose-Capillary: 472 mg/dL — ABNORMAL HIGH (ref 65–99)
Glucose-Capillary: 283 mg/dL — ABNORMAL HIGH (ref 65–99)

## 2015-10-16 MED ORDER — INSULIN ASPART 100 UNIT/ML ~~LOC~~ SOLN
2.0000 [IU] | Freq: Once | SUBCUTANEOUS | Status: AC
  Administered 2015-10-16: 2 [IU] via SUBCUTANEOUS
  Filled 2015-10-16: qty 1

## 2015-10-16 MED ORDER — SODIUM CHLORIDE 0.9 % IV BOLUS (SEPSIS)
500.0000 mL | Freq: Once | INTRAVENOUS | Status: AC
  Administered 2015-10-16: 500 mL via INTRAVENOUS

## 2015-10-16 NOTE — Discharge Instructions (Signed)
Take your usual prescriptions as previously directed. Continue to follow your diabetes eating plan, and record your home glucose readings. Show these readings to your doctor during your next office visit.  Call your regular medical doctor on Monday to schedule a follow up appointment within the next 3 days.  Return to the Emergency Department immediately sooner if worsening.

## 2015-10-16 NOTE — ED Triage Notes (Signed)
Pt with high glucose readings, intermittently over 500. Pt states he is having problems with control. Blood sugars will be high and then drop. Pt's reading at home at [phone].

## 2015-10-16 NOTE — ED Provider Notes (Signed)
[place] DEPT Provider Note   CSN: [id number] Arrival date & time: 10/16/15  1332     History   Chief Complaint Chief Complaint  Patient presents with  . Hyperglycemia    HPI [name] is a 82 y.o. male.  HPI  Pt was seen at 1600. Per pt, c/o gradual onset and persistence of multiple intermittent episodes of "high blood sugars" for the past 2+ weeks. Pt states he was evaluated by his PMD 2 weeks ago for same, and had "my doses of insulin changed around." Pt states he has been taking his DM meds as prescribed. Denies any other complaints. Denies CP/palpitations, no SOB/cough, no abd pain, no N/V/D, no fevers.    Past Medical History:  Diagnosis Date  . Asymptomatic stenosis of left vertebral artery 2002, 2005   Status post PTA/stent with redo; normal antegrade flow on dopplers 09/2013  . CAD (coronary artery disease)    1995 - CABG x3; redo in CABG x 1 2001 Free Radial to LAD; All grafts patent by Cath 08/2011  . CAD (coronary artery disease) of artery bypass graft 2000   PCI x 2 - ostial & prox-mid LAD (BMS) for atretic LIMA-LAD  . CAD in native artery 1995   CABG x 3 - LIMA-LAD, SVG-OM2, SVG-rPDA  . DM (diabetes mellitus) type II controlled peripheral vascular disorder ([place]) 2002   Left subclavian and left vertebral artery stenoses  . Glaucoma   . Hyperlipidemia LDL goal <70   . Hypertension, essential   . S/P CABG x 3 1995   . S/P Redo CABG x 1 2001   L Radial-LAD after 2 failed attempts @ LIMA-LAD PTCA; LAD stents 100% occluded  . Stenosis of left subclavian artery ([place]) 11/2003   Status post PTA/stent --> < 50 % stenosis by Dopplers 09/2013    Patient Active Problem List   Diagnosis Date Noted  . Bilateral arm pain 12/11/2014  . Atherosclerosis of coronary artery bypass graft with other forms of angina pectoris   . Hypoglycemia 09/13/2014  . Ischemic chest pain ([place])   . Diarrhea 08/02/2014  . GERD (gastroesophageal reflux disease) 08/02/2014  .  Abdominal pain 08/02/2014  . Weight loss 08/02/2014  . Angina at rest [facility]) 08/02/2014  . DM (diabetes mellitus) type II controlled peripheral vascular disorder ([place])   . Essential hypertension 08/22/2011  . DM2 (diabetes mellitus, type 2) - with CAD 08/22/2011  . Hyperlipidemia with target LDL less than 70   . Peripheral arterial disease - left vertebral and subclavian artery stenosis status post PTA/stent 08/11/2000    Class: Diagnosis of  . CAD -> CABG x3 then Re-DO CABG x1 (LRad-dLAD) after atretic LIMA & LAD stent occlusion. 11/26/1993    Class: Diagnosis of  . S/P CABG (coronary artery bypass graft): Initial CABGX3 1995 (LIMA-LAD, SVG-OM 2, SVG-RPDA) --> redo CABG x1 FreeLRad-LAD for a occluded LAD with atretic LIMA and failed attempted revascularization. 08/11/1993    Class: History of    Past Surgical History:  Procedure Laterality Date  . CARDIAC CATHETERIZATION  November 2006; August 13   Known occluded LIMA-LAD and ostial LAD. Moderate to severe proximal circumflex and RCA disease. Widely patent freeLRAD-dLAD, as well as SVG-RPDA (backfilling RPL), SVG-OM 2 (backfilling OM1)  . CARDIAC CATHETERIZATION N/A 08/04/2014   Procedure: Left Heart Cath and Cors/Grafts Angiography;  Surgeon: Leonie Man, MD;  Location: [facility];  Service: Cardiovascular;  Laterality: N/A;  . CORONARY ANGIOPLASTY  April and May 2001  After Both LAD stents occluded - PTCA of anastomatic LIMA-LAD lesion -- Unsuccessful.  . CORONARY ARTERY BYPASS GRAFT  1995    LIMA-LAD, SVG-OM2, SVG-rPDA  . CORONARY ARTERY BYPASS GRAFT  June 2001   Dr. Lucianne Lei Trigt: Redo LAD grafting with free Left Radial-distal LAD  . CORONARY STENT PLACEMENT  1995-2000   2 BMS stents to osital-proximal & proximal-mid LAD; because of atretic LIMA-LDA  . LEFT HEART CATHETERIZATION WITH CORONARY/GRAFT ANGIOGRAM N/A 08/22/2011   Procedure: LEFT HEART CATHETERIZATION WITH Beatrix Fetters;  Surgeon: Leonie Man, MD;   Location: [facility];  Service: Cardiovascular;  Laterality: N/A;  . SHOULDER OPEN ROTATOR CUFF REPAIR  October 2004   Dr. Noemi Chapel  . SP Ninfa Meeker OR THOR CAROTID STENT Left October 2002; November 2005   Left Vertebral stent placed in October 2002 (Dr. Patrecia Pour); redo PCI in 2005  . SUBCLAVIAN ARTERY STENT Left November 2005   Dr. Gwenlyn Found       Home Medications    Prior to Admission medications   Medication Sig Start Date End Date Taking? Authorizing Provider  acetaminophen (TYLENOL) 500 MG tablet Take 1,000 mg by mouth every 6 (six) hours as needed for mild pain or headache.    Historical Provider, MD  Ascorbic Acid (VITAMIN C) 1000 MG tablet Take 1,000 mg by mouth daily.    Historical Provider, MD  aspirin EC 81 MG tablet Take 81 mg by mouth daily.    Historical Provider, MD  atorvastatin (LIPITOR) 40 MG tablet Take 40 mg by mouth daily.    Historical Provider, MD  brimonidine (ALPHAGAN P) 0.1 % SOLN Place 1 drop into the right eye 3 (three) times daily.     Historical Provider, MD  cephALEXin (KEFLEX) 500 MG capsule Take 1 capsule (500 mg total) by mouth 2 (two) times daily. 04/20/15   Gloriann Loan, PA-C  Cholecalciferol (VITAMIN D-3) 1000 UNITS CAPS Take 1,000 Units by mouth daily.    Historical Provider, MD  clopidogrel (PLAVIX) 75 MG tablet Take 1 tablet (75 mg total) by mouth daily. 10/24/12   Leonie Man, MD  doxazosin (CARDURA) 4 MG tablet Take 2 mg by mouth at bedtime.     Historical Provider, MD  fluticasone (FLONASE) 50 MCG/ACT nasal spray Place 1 spray into both nostrils daily. 11/17/14   Historical Provider, MD  gabapentin (NEURONTIN) 300 MG capsule Take 300 mg by mouth 3 (three) times daily.    Historical Provider, MD  glipiZIDE (GLUCOTROL XL) 10 MG 24 hr tablet Take 10 mg by mouth 2 (two) times daily. 03/31/15   Historical Provider, MD  GLOBAL INJECT EASE LANCETS 30G MISC DIABETIC CLUB - USE TO CHECK BLOOD GLUCOSE LEVELS 02/23/15   Historical Provider, MD  glucose  blood test strip DIABETIC CLUB - USE TO CHECK BLOOD GLUCOSE LEVELS 02/23/15   Historical Provider, MD  insulin aspart protamine- aspart (NOVOLOG MIX 70/30) (70-30) 100 UNIT/ML injection Inject 10 Units into the skin 2 (two) times daily with a meal.    Historical Provider, MD  isosorbide mononitrate (IMDUR) 60 MG 24 hr tablet TAKE 1 & 1/2 TABLETS BY MOUTH EVERY DAY 09/23/15   Leonie Man, MD  lansoprazole (PREVACID) 15 MG capsule Take 15 mg by mouth daily at 12 noon.    Historical Provider, MD  latanoprost (XALATAN) 0.005 % ophthalmic solution Place 1 drop into both eyes at bedtime.     Historical Provider, MD  lisinopril (PRINIVIL,ZESTRIL) 40 MG tablet Take 0.5 tablets (20 mg  total) by mouth daily. 08/04/14   Shanker Kristeen Mans, MD  loratadine (CLARITIN) 10 MG tablet Take 10 mg by mouth daily as needed. Forb  Seasonal allergies    Historical Provider, MD  Omega-3 Fatty Acids (FISH OIL) 300 MG CAPS Take 300 mg by mouth 2 (two) times daily.     Historical Provider, MD  saccharomyces boulardii (FLORASTOR) 250 MG capsule Take 1 capsule (250 mg total) by mouth 2 (two) times daily. Patient taking differently: Take 250 mg by mouth daily.  08/04/14   Shanker Kristeen Mans, MD  saw palmetto 160 MG capsule Take 480 mg by mouth daily.    Historical Provider, MD  timolol (TIMOPTIC) 0.5 % ophthalmic solution Place 1 drop into the left eye 3 (three) times daily.     Historical Provider, MD    Family History Family History  Problem Relation Age of Onset  . Diabetes Mother   . Diabetes Sister   . Diabetes Brother     Social History Social History  Substance Use Topics  . Smoking status: Former Smoker    Packs/day: 3.00    Years: 30.00    Types: Cigarettes  . Smokeless tobacco: Never Used     Comment: quit smoking about 50 years ago  . Alcohol use No     Allergies   Iohexol; Contrast media [iodinated diagnostic agents]; and Metformin and related   Review of Systems Review of Systems ROS: Statement:  All systems negative except as marked or noted in the HPI; Constitutional: Negative for fever and chills. ; ; Eyes: Negative for eye pain, redness and discharge. ; ; ENMT: Negative for ear pain, hoarseness, nasal congestion, sinus pressure and sore throat. ; ; Cardiovascular: Negative for chest pain, palpitations, diaphoresis, dyspnea and peripheral edema. ; ; Respiratory: Negative for cough, wheezing and stridor. ; ; Gastrointestinal: Negative for nausea, vomiting, diarrhea, abdominal pain, blood in stool, hematemesis, jaundice and rectal bleeding. . ; ; Genitourinary: Negative for dysuria, flank pain and hematuria. ; ; Musculoskeletal: Negative for back pain and neck pain. Negative for swelling and trauma.; ; Skin: Negative for pruritus, rash, abrasions, blisters, bruising and skin lesion.; ; Neuro: Negative for headache, lightheadedness and neck stiffness. Negative for weakness, altered level of consciousness, altered mental status, extremity weakness, paresthesias, involuntary movement, seizure and syncope.     Physical Exam Updated Vital Signs BP 135/65   Pulse (!) 53   Temp 97.9 F (36.6 C) (Oral)   Resp 19   Ht 5\' 7"  (1.702 m)   Wt 130 lb (59 kg)   SpO2 98%   BMI 20.36 kg/m   Physical Exam 1605: Physical examination:  Nursing notes reviewed; Vital signs and O2 SAT reviewed;  Constitutional: Well developed, Well nourished, Well hydrated, In no acute distress; Head:  Normocephalic, atraumatic; Eyes: EOMI, PERRL, No scleral icterus; ENMT: Mouth and pharynx normal, Mucous membranes moist; Neck: Supple, Full range of motion, No lymphadenopathy; Cardiovascular: Regular rate and rhythm, No gallop; Respiratory: Breath sounds clear & equal bilaterally, No wheezes.  Speaking full sentences with ease, Normal respiratory effort/excursion; Chest: Nontender, Movement normal; Abdomen: Soft, Nontender, Nondistended, Normal bowel sounds; Genitourinary: No CVA tenderness; Extremities: Pulses normal, No  tenderness, No edema, No calf edema or asymmetry.; Neuro: AA&Ox3, Major CN grossly intact.  Speech clear. No gross focal motor or sensory deficits in extremities.; Skin: Color normal, Warm, Dry.    ED Treatments / Results  Labs (all labs ordered are listed, but only abnormal results are displayed)  EKG  EKG Interpretation None       Radiology   Procedures Procedures (including critical care time)  Medications Ordered in ED Medications  sodium chloride 0.9 % bolus 500 mL (500 mLs Intravenous New Bag/Given 10/16/15 1614)  insulin aspart (novoLOG) injection 2 Units (2 Units Subcutaneous Given 10/16/15 1621)     Initial Impression / Assessment and Plan / ED Course  I have reviewed the triage vital signs and the nursing notes.  Pertinent labs & imaging results that were available during my care of the patient were reviewed by me and considered in my medical decision making (see chart for details).  MDM Reviewed: previous chart, nursing note and vitals Reviewed previous: labs Interpretation: labs   Results for orders placed or performed during the hospital encounter of 82/95/62  Basic metabolic panel  Result Value Ref Range   Sodium 130 (L) 135 - 145 mmol/L   Potassium 4.2 3.5 - 5.1 mmol/L   Chloride 96 (L) 101 - 111 mmol/L   CO2 26 22 - 32 mmol/L   Glucose, Bld 461 (H) 65 - 99 mg/dL   BUN 23 (H) 6 - 20 mg/dL   Creatinine, Ser 1.27 (H) 0.61 - 1.24 mg/dL   Calcium 9.1 8.9 - 10.3 mg/dL   GFR calc non Af Amer 51 (L) >60 mL/min   GFR calc Af Amer 59 (L) >60 mL/min   Anion gap 8 5 - 15  CBC  Result Value Ref Range   WBC 5.5 4.0 - 10.5 K/uL   RBC 4.35 4.22 - 5.81 MIL/uL   Hemoglobin 13.2 13.0 - 17.0 g/dL   HCT 37.5 (L) 39.0 - 52.0 %   MCV 86.2 78.0 - 100.0 fL   MCH 30.3 26.0 - 34.0 pg   MCHC 35.2 30.0 - 36.0 g/dL   RDW 12.2 11.5 - 15.5 %   Platelets 178 150 - 400 K/uL  Urinalysis, Routine w reflex microscopic  Result Value Ref Range   Color, Urine YELLOW YELLOW     APPearance CLEAR CLEAR   Specific Gravity, Urine 1.010 1.005 - 1.030   pH 5.5 5.0 - 8.0   Glucose, UA >1000 (A) NEGATIVE mg/dL   Hgb urine dipstick NEGATIVE NEGATIVE   Bilirubin Urine NEGATIVE NEGATIVE   Ketones, ur 15 (A) NEGATIVE mg/dL   Protein, ur NEGATIVE NEGATIVE mg/dL   Nitrite NEGATIVE NEGATIVE   Leukocytes, UA NEGATIVE NEGATIVE  Urine microscopic-add on  Result Value Ref Range   Squamous Epithelial / LPF NONE SEEN NONE SEEN   WBC, UA 0-5 0 - 5 WBC/hpf   RBC / HPF NONE SEEN 0 - 5 RBC/hpf   Bacteria, UA RARE (A) NONE SEEN  CBG monitoring, ED  Result Value Ref Range   Glucose-Capillary 472 (H) 65 - 99 mg/dL  CBG monitoring, ED  Result Value Ref Range   Glucose-Capillary 316 (H) 65 - 99 mg/dL  CBG monitoring, ED  Result Value Ref Range   Glucose-Capillary 283 (H) 65 - 99 mg/dL    Results for ROBSON, TRICKEY (MRN [id number]) as of 10/16/2015 17:31  Ref. Range 09/15/2014 02:32 09/16/2014 02:40 09/17/2014 05:05 04/20/2015 14:20 10/16/2015 14:37  BUN Latest Ref Range: 6 - 20 mg/dL 17 18 19 24  (H) 23 (H)  Creatinine Latest Ref Range: 0.61 - 1.24 mg/dL 1.12 0.99 1.02 1.18 1.27 (H)    2000:  CBG trending downward after IVF and small dose of SQ insulin. Pt not acidotic. Continues to deny any complaints. States he is ready  to go home now. Dx and testing d/w pt and family.  Questions answered.  Verb understanding, agreeable to d/c home with outpt f/u.    Final Clinical Impressions(s) / ED Diagnoses   Final diagnoses:  None    New Prescriptions New Prescriptions   No medications on file     Francine Graven, DO 10/20/15 1925

## 2015-10-16 NOTE — ED Notes (Signed)
Pt checked cbg 492. Last took insulin this am

## 2015-10-16 NOTE — ED Notes (Signed)
[id number]  

## 2015-10-18 LAB — URINE CULTURE: Culture: NO GROWTH

## 2015-10-21 DIAGNOSIS — Z6820 Body mass index (BMI) 20.0-20.9, adult: Secondary | ICD-10-CM | POA: Diagnosis not present

## 2015-10-21 DIAGNOSIS — E1140 Type 2 diabetes mellitus with diabetic neuropathy, unspecified: Secondary | ICD-10-CM | POA: Diagnosis not present

## 2015-10-21 DIAGNOSIS — E1165 Type 2 diabetes mellitus with hyperglycemia: Secondary | ICD-10-CM | POA: Diagnosis not present

## 2015-10-21 DIAGNOSIS — N481 Balanitis: Secondary | ICD-10-CM | POA: Diagnosis not present

## 2015-10-27 DIAGNOSIS — E1165 Type 2 diabetes mellitus with hyperglycemia: Secondary | ICD-10-CM | POA: Diagnosis not present

## 2015-10-27 DIAGNOSIS — Z6820 Body mass index (BMI) 20.0-20.9, adult: Secondary | ICD-10-CM | POA: Diagnosis not present

## 2015-10-27 DIAGNOSIS — E1140 Type 2 diabetes mellitus with diabetic neuropathy, unspecified: Secondary | ICD-10-CM | POA: Diagnosis not present

## 2015-10-27 DIAGNOSIS — E11649 Type 2 diabetes mellitus with hypoglycemia without coma: Secondary | ICD-10-CM | POA: Diagnosis not present

## 2015-12-06 DIAGNOSIS — Z794 Long term (current) use of insulin: Secondary | ICD-10-CM | POA: Diagnosis not present

## 2015-12-06 DIAGNOSIS — M1990 Unspecified osteoarthritis, unspecified site: Secondary | ICD-10-CM | POA: Diagnosis not present

## 2015-12-06 DIAGNOSIS — Z79899 Other long term (current) drug therapy: Secondary | ICD-10-CM | POA: Diagnosis not present

## 2015-12-06 DIAGNOSIS — I10 Essential (primary) hypertension: Secondary | ICD-10-CM | POA: Diagnosis not present

## 2015-12-06 DIAGNOSIS — E11649 Type 2 diabetes mellitus with hypoglycemia without coma: Secondary | ICD-10-CM | POA: Diagnosis not present

## 2015-12-06 DIAGNOSIS — S0990XA Unspecified injury of head, initial encounter: Secondary | ICD-10-CM | POA: Diagnosis not present

## 2015-12-06 DIAGNOSIS — I2510 Atherosclerotic heart disease of native coronary artery without angina pectoris: Secondary | ICD-10-CM | POA: Diagnosis not present

## 2015-12-06 DIAGNOSIS — Z889 Allergy status to unspecified drugs, medicaments and biological substances status: Secondary | ICD-10-CM | POA: Diagnosis not present

## 2015-12-06 DIAGNOSIS — I6789 Other cerebrovascular disease: Secondary | ICD-10-CM | POA: Diagnosis not present

## 2015-12-06 DIAGNOSIS — I6523 Occlusion and stenosis of bilateral carotid arteries: Secondary | ICD-10-CM | POA: Diagnosis not present

## 2015-12-06 DIAGNOSIS — Z888 Allergy status to other drugs, medicaments and biological substances status: Secondary | ICD-10-CM | POA: Diagnosis not present

## 2015-12-06 DIAGNOSIS — E785 Hyperlipidemia, unspecified: Secondary | ICD-10-CM | POA: Diagnosis not present

## 2015-12-06 DIAGNOSIS — E161 Other hypoglycemia: Secondary | ICD-10-CM | POA: Diagnosis not present

## 2015-12-22 DIAGNOSIS — E1140 Type 2 diabetes mellitus with diabetic neuropathy, unspecified: Secondary | ICD-10-CM | POA: Diagnosis not present

## 2015-12-22 DIAGNOSIS — E162 Hypoglycemia, unspecified: Secondary | ICD-10-CM | POA: Diagnosis not present

## 2015-12-22 DIAGNOSIS — I10 Essential (primary) hypertension: Secondary | ICD-10-CM | POA: Diagnosis not present

## 2015-12-22 DIAGNOSIS — I2510 Atherosclerotic heart disease of native coronary artery without angina pectoris: Secondary | ICD-10-CM | POA: Diagnosis not present

## 2015-12-22 DIAGNOSIS — E1165 Type 2 diabetes mellitus with hyperglycemia: Secondary | ICD-10-CM | POA: Diagnosis not present

## 2015-12-29 DIAGNOSIS — E1140 Type 2 diabetes mellitus with diabetic neuropathy, unspecified: Secondary | ICD-10-CM | POA: Diagnosis not present

## 2015-12-29 DIAGNOSIS — Z6821 Body mass index (BMI) 21.0-21.9, adult: Secondary | ICD-10-CM | POA: Diagnosis not present

## 2015-12-29 DIAGNOSIS — E11649 Type 2 diabetes mellitus with hypoglycemia without coma: Secondary | ICD-10-CM | POA: Diagnosis not present

## 2016-02-12 ENCOUNTER — Other Ambulatory Visit: Payer: Self-pay | Admitting: Cardiology

## 2016-02-16 ENCOUNTER — Other Ambulatory Visit: Payer: Self-pay | Admitting: Cardiology

## 2016-02-16 DIAGNOSIS — E11649 Type 2 diabetes mellitus with hypoglycemia without coma: Secondary | ICD-10-CM | POA: Diagnosis not present

## 2016-02-16 DIAGNOSIS — Z6822 Body mass index (BMI) 22.0-22.9, adult: Secondary | ICD-10-CM | POA: Diagnosis not present

## 2016-02-16 DIAGNOSIS — E1140 Type 2 diabetes mellitus with diabetic neuropathy, unspecified: Secondary | ICD-10-CM | POA: Diagnosis not present

## 2016-02-16 NOTE — Telephone Encounter (Signed)
REFILL 

## 2016-03-08 ENCOUNTER — Ambulatory Visit (INDEPENDENT_AMBULATORY_CARE_PROVIDER_SITE_OTHER): Payer: Medicare Other | Admitting: Cardiology

## 2016-03-08 ENCOUNTER — Encounter: Payer: Self-pay | Admitting: Cardiology

## 2016-03-08 VITALS — BP 122/62 | HR 69 | Ht 67.0 in | Wt 135.0 lb

## 2016-03-08 DIAGNOSIS — I2089 Other forms of angina pectoris: Secondary | ICD-10-CM

## 2016-03-08 DIAGNOSIS — I739 Peripheral vascular disease, unspecified: Secondary | ICD-10-CM

## 2016-03-08 DIAGNOSIS — I208 Other forms of angina pectoris: Secondary | ICD-10-CM | POA: Diagnosis not present

## 2016-03-08 DIAGNOSIS — R42 Dizziness and giddiness: Secondary | ICD-10-CM

## 2016-03-08 DIAGNOSIS — E785 Hyperlipidemia, unspecified: Secondary | ICD-10-CM | POA: Diagnosis not present

## 2016-03-08 DIAGNOSIS — I25119 Atherosclerotic heart disease of native coronary artery with unspecified angina pectoris: Secondary | ICD-10-CM

## 2016-03-08 DIAGNOSIS — I10 Essential (primary) hypertension: Secondary | ICD-10-CM

## 2016-03-08 NOTE — Patient Instructions (Addendum)
Medication changes-- stop hctz   (hydrochlorothiazide)  Keep hydrated  drink 8-10  10oz of water daily.    SCHEDULE AT [place] Your physician has requested that you have a carotid duplex. This test is an ultrasound of the carotid arteries in your neck. It looks at blood flow through these arteries that supply the brain with blood. Allow one hour for this exam. There are no restrictions or special instructions.   Your physician recommends that you schedule a follow-up appointment WITH YOUR PRIMARY DOCTOR - DR Deneen Harts- to discuss your blood sugar readings    Your physician recommends that you schedule a follow-up appointment in [place] [facility]     Your physician wants you to follow-up in [place].You will receive a reminder letter in the mail two months in advance. If you don't receive a letter, please call our office to schedule the follow-up appointment.   If you need a refill on your cardiac medications before your next appointment, please call your pharmacy.
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
## 2016-03-10 ENCOUNTER — Encounter: Payer: Self-pay | Admitting: Cardiology

## 2016-03-10 DIAGNOSIS — R42 Dizziness and giddiness: Secondary | ICD-10-CM | POA: Insufficient documentation

## 2016-03-10 NOTE — Assessment & Plan Note (Signed)
Perfectly well controlled - no longer on ACE-I --> allow for some permissive HTN given current dizziness.  D/c HCTZ Adequate hydration.

## 2016-03-10 NOTE — Assessment & Plan Note (Signed)
Not really c/o angina @ present -- ASA, Plavix --(as before, would be fine stopping ASA)> On Imdur & ACEI along with simvastatin. No BB due to bradycardia.    As lon as he is doing well & close to baseline, continue current Rx.

## 2016-03-10 NOTE — Assessment & Plan Note (Addendum)
His symptoms sound both orthostatic & vertebrobasilar vs. Vertiginous. He has h/o subclavian & ? Carotid disease.  Plan recheck carotid doppler - focus on vertebral & Subclavian flow. D/c HCTZ  & hydrate Follow glycemic control

## 2016-03-10 NOTE — Assessment & Plan Note (Signed)
No longer present - on Imdur 60-90 mg -- doing well. Next option would be add CCB --> continue to f/u for worsening angina-equivalent Sx.[name]

## 2016-03-10 NOTE — Assessment & Plan Note (Signed)
Changed back to high dose simvastatin (related to [employer]acy [employer]) - from atorvastatin.  PCP checks lipids.

## 2016-03-10 NOTE — Assessment & Plan Note (Signed)
Better controlled with BP control

## 2016-03-14 ENCOUNTER — Other Ambulatory Visit: Payer: Self-pay | Admitting: Cardiology

## 2016-03-14 NOTE — Telephone Encounter (Signed)
Rx(s) sent to pharmacy electronically.  

## 2016-03-21 ENCOUNTER — Ambulatory Visit (HOSPITAL_COMMUNITY)
Admission: RE | Admit: 2016-03-21 | Discharge: 2016-03-21 | Disposition: A | Discharge status: Home / Self Care | Payer: Medicare Other | Source: Ambulatory Visit | Attending: Cardiovascular Disease | Admitting: Cardiovascular Disease

## 2016-03-21 DIAGNOSIS — I6523 Occlusion and stenosis of bilateral carotid arteries: Secondary | ICD-10-CM | POA: Insufficient documentation

## 2016-03-21 DIAGNOSIS — I739 Peripheral vascular disease, unspecified: Secondary | ICD-10-CM | POA: Diagnosis not present

## 2016-03-21 DIAGNOSIS — R42 Dizziness and giddiness: Secondary | ICD-10-CM | POA: Insufficient documentation

## 2016-03-30 ENCOUNTER — Telehealth: Payer: Self-pay | Admitting: Cardiology

## 2016-03-30 NOTE — Telephone Encounter (Signed)
Spoke to patient. Carotid  (03/21/16)Results have not been reviewed by Dr Ellyn Hack.  preliminary  Report given. Patient states he is still having the same symptoms and want to know what to do next .  Will defer Dr Ellyn Hack.

## 2016-03-30 NOTE — Telephone Encounter (Signed)
New Message  Pt calling to get results.  Please f/u

## 2016-03-31 NOTE — Telephone Encounter (Signed)
Heterogeneous plaque, bilaterally. 1-39% bilateral ICA stenosis (high end range on the right).  Normal subclavian arteries, bilaterally. Patent vertebral arteries with antegrade flow. Small caliber right vertebral artery.   Pretty normal - rec. 1 yr f/u  [facility] (was not in my results box - I may have clicked a wrong button) - sorry for delay. Glenetta Hew, MD

## 2016-05-13 DIAGNOSIS — H6123 Impacted cerumen, bilateral: Secondary | ICD-10-CM | POA: Diagnosis not present

## 2016-05-13 DIAGNOSIS — D649 Anemia, unspecified: Secondary | ICD-10-CM | POA: Diagnosis not present

## 2016-05-13 DIAGNOSIS — Z6823 Body mass index (BMI) 23.0-23.9, adult: Secondary | ICD-10-CM | POA: Diagnosis not present

## 2016-05-13 DIAGNOSIS — R5383 Other fatigue: Secondary | ICD-10-CM | POA: Diagnosis not present

## 2016-06-28 DIAGNOSIS — E11649 Type 2 diabetes mellitus with hypoglycemia without coma: Secondary | ICD-10-CM | POA: Diagnosis not present

## 2016-06-28 DIAGNOSIS — Z6822 Body mass index (BMI) 22.0-22.9, adult: Secondary | ICD-10-CM | POA: Diagnosis not present

## 2016-06-28 DIAGNOSIS — E1165 Type 2 diabetes mellitus with hyperglycemia: Secondary | ICD-10-CM | POA: Diagnosis not present

## 2016-09-05 IMAGING — DX DG CHEST 2V
2 series · 2 of 2 positions shown · non-contrast
Comparison: 07/28/2014 and prior radiographs

CLINICAL DATA: 82-year-old male with chest pain and dizziness.

EXAM:
CHEST  2 VIEW

[chest lat]
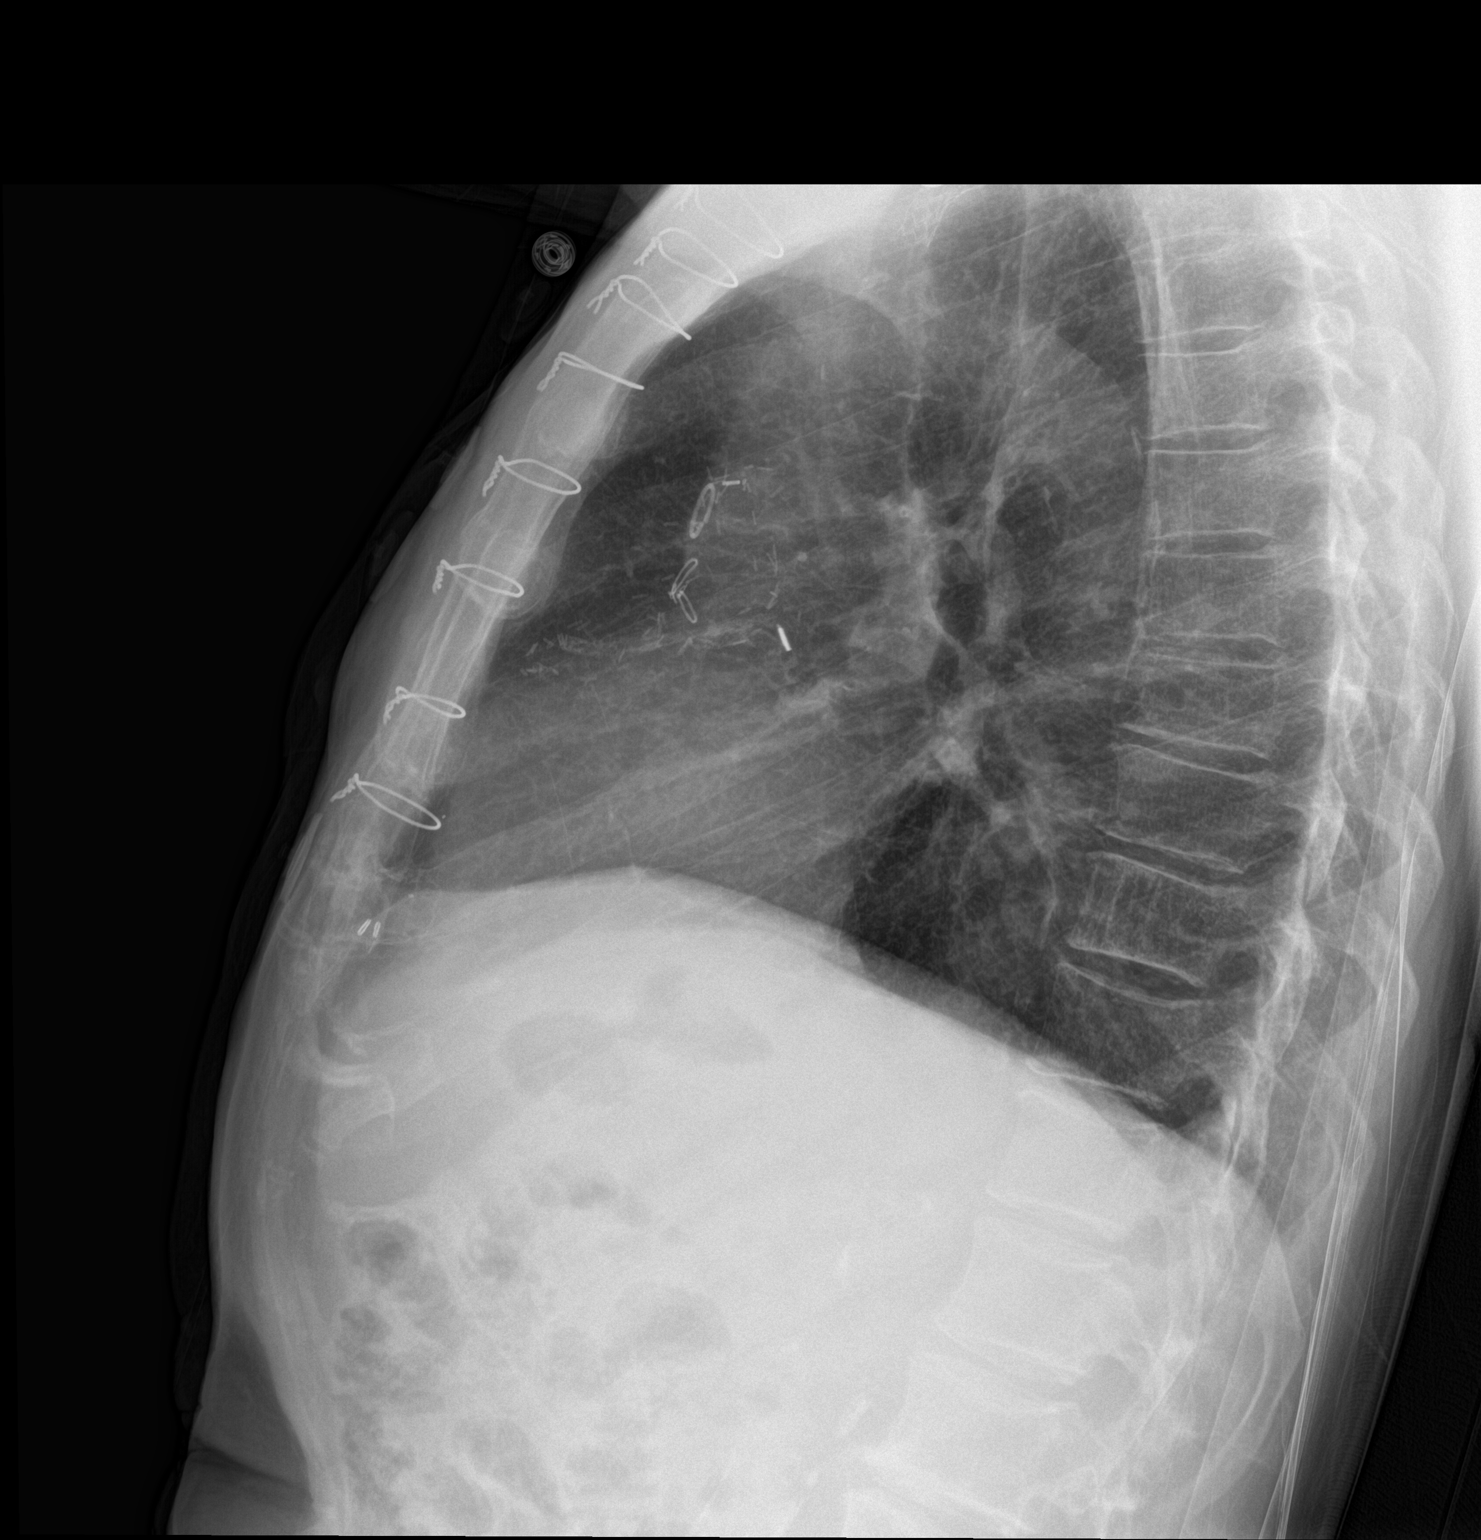

[chest ap]
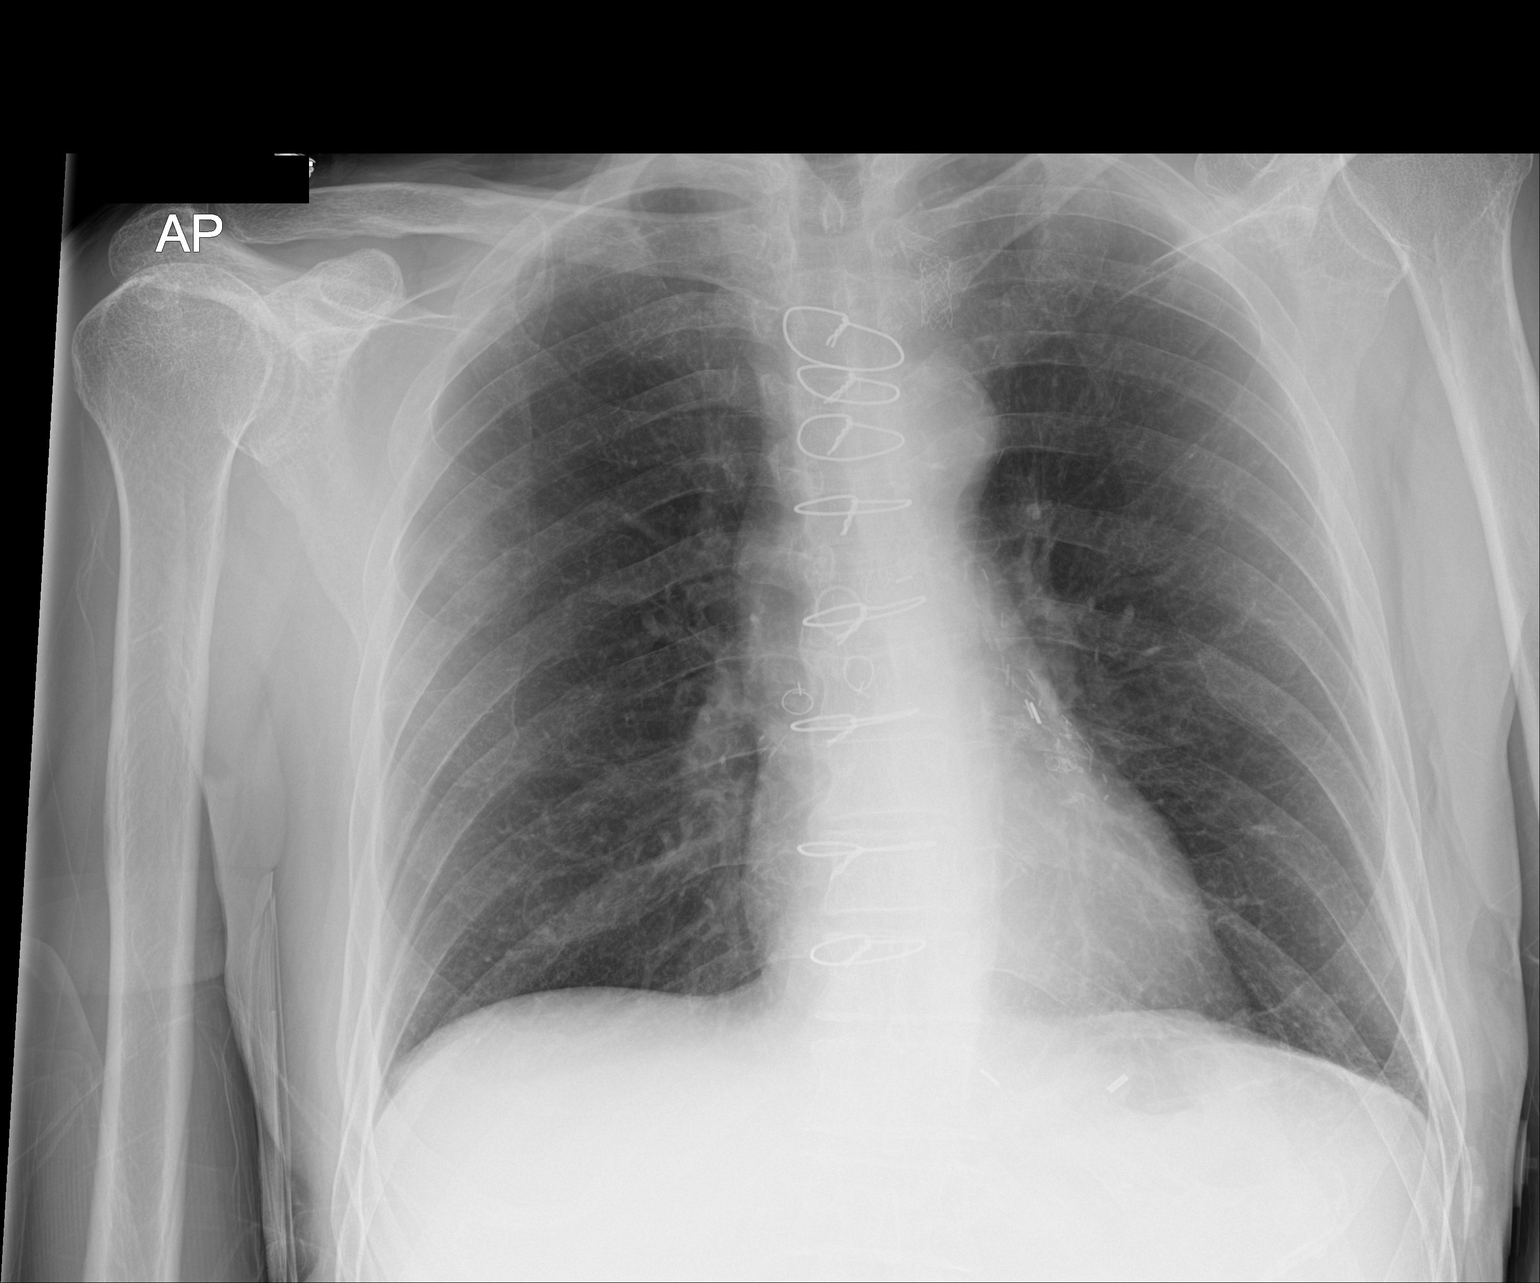

[2 of 2 positions shown; findings below may reference images not displayed]

FINDINGS: CABG changes and left subclavian stent again noted.

The cardiomediastinal silhouette is unchanged.

There is no evidence of focal airspace disease, pulmonary edema,
suspicious pulmonary nodule/mass, pleural effusion, or pneumothorax.

No acute bony abnormalities are identified.
IMPRESSION: No active cardiopulmonary disease.

## 2016-09-28 ENCOUNTER — Ambulatory Visit: Payer: Medicare Other | Admitting: Cardiology

## 2016-10-02 ENCOUNTER — Other Ambulatory Visit: Payer: Self-pay | Admitting: Cardiology

## 2016-10-26 ENCOUNTER — Other Ambulatory Visit: Payer: Self-pay | Admitting: *Deleted

## 2016-10-26 DIAGNOSIS — I6523 Occlusion and stenosis of bilateral carotid arteries: Secondary | ICD-10-CM

## 2016-10-27 DIAGNOSIS — E1165 Type 2 diabetes mellitus with hyperglycemia: Secondary | ICD-10-CM | POA: Diagnosis not present

## 2016-10-27 DIAGNOSIS — I2510 Atherosclerotic heart disease of native coronary artery without angina pectoris: Secondary | ICD-10-CM | POA: Diagnosis not present

## 2016-10-27 DIAGNOSIS — E11649 Type 2 diabetes mellitus with hypoglycemia without coma: Secondary | ICD-10-CM | POA: Diagnosis not present

## 2016-10-27 DIAGNOSIS — E1140 Type 2 diabetes mellitus with diabetic neuropathy, unspecified: Secondary | ICD-10-CM | POA: Diagnosis not present

## 2016-10-27 DIAGNOSIS — Z23 Encounter for immunization: Secondary | ICD-10-CM | POA: Diagnosis not present

## 2016-10-27 DIAGNOSIS — I10 Essential (primary) hypertension: Secondary | ICD-10-CM | POA: Diagnosis not present

## 2016-11-04 ENCOUNTER — Ambulatory Visit: Payer: Medicare Other | Admitting: Cardiology

## 2016-11-21 ENCOUNTER — Encounter: Payer: Self-pay | Admitting: Cardiology

## 2016-11-21 ENCOUNTER — Ambulatory Visit: Payer: Medicare Other | Admitting: Cardiology

## 2016-11-21 VITALS — BP 150/56 | HR 57 | Ht 67.0 in | Wt 146.8 lb

## 2016-11-21 DIAGNOSIS — I25119 Atherosclerotic heart disease of native coronary artery with unspecified angina pectoris: Secondary | ICD-10-CM

## 2016-11-21 DIAGNOSIS — I739 Peripheral vascular disease, unspecified: Secondary | ICD-10-CM | POA: Diagnosis not present

## 2016-11-21 DIAGNOSIS — Z951 Presence of aortocoronary bypass graft: Secondary | ICD-10-CM | POA: Diagnosis not present

## 2016-11-21 DIAGNOSIS — I10 Essential (primary) hypertension: Secondary | ICD-10-CM | POA: Diagnosis not present

## 2016-11-21 DIAGNOSIS — R42 Dizziness and giddiness: Secondary | ICD-10-CM | POA: Diagnosis not present

## 2016-11-21 DIAGNOSIS — I208 Other forms of angina pectoris: Secondary | ICD-10-CM | POA: Diagnosis not present

## 2016-11-21 MED ORDER — ISOSORBIDE MONONITRATE ER 60 MG PO TB24: ORAL_TABLET | ORAL | 6 refills | Status: DC
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
## 2016-11-21 NOTE — Patient Instructions (Addendum)
Medication changes  May take ISOSORBIDE MONO ( IMDUR) 1 and 1/2 tablets up to 2 tablets daily , if needed for chest discomfort.    Your physician wants you to follow-up in 6 months with DR HARDING.You will receive a reminder letter in the mail two months in advance. If you don't receive a letter, please call our office to schedule the follow-up appointment.    If you need a refill on your cardiac medications before your next appointment, please call your pharmacy.

## 2016-11-22 ENCOUNTER — Encounter: Payer: Self-pay | Admitting: Cardiology

## 2016-11-22 NOTE — Assessment & Plan Note (Signed)
Current chest pain is cardiac in nature.  It does not seem to be consistent with his classic anginal symptoms. Could very well be siblings monitoring.  Continue to mark monitor for signs of his mother and mother return home.

## 2016-11-22 NOTE — Assessment & Plan Note (Signed)
Less prominent now off of diuretic.

## 2016-11-22 NOTE — Assessment & Plan Note (Signed)
We are allowing for mild permissive hypertension for better energy levels, however his blood pressure now is little bit high.  He is only on Imdur now.  Not on beta-blocker or ACE inhibitor.

## 2016-11-22 NOTE — Assessment & Plan Note (Signed)
Previously followed by Dr. Gwenlyn Found.  Check Dopplers No active claudication symptoms

## 2016-11-22 NOTE — Assessment & Plan Note (Signed)
In the last few years he has had 2 catheterizations showing patent grafts along with a negative Myoview.  For now, we will forego further testing unless she actively has recurrence of worsening of his angina.  We will also follow volunteers for changes in symptoms.

## 2016-11-22 NOTE — Assessment & Plan Note (Addendum)
I do not know really know that the symptoms he is describing is actually active angina.  It seems very musculoskeletal in nature. I recommend using Tylenol. I will also increase his Imdur dose up to 2 tabs daily. Next option would be to add calcium channel blocker, however his ACE inhibitor was discontinued because of hypotension.[name]

## 2016-11-22 NOTE — Assessment & Plan Note (Addendum)
Somewhat controlled symptoms now on current medications.  Symptoms he is describing do not sound like classic angina.. Unfortunately did not have a lot of revascularization options.  We are therefore limited to medications.  Not on a beta-blocker because of bradycardia unfortunately.  He is on Imdur which we are increasing the dose of.  He does not seem to be on an ACE inhibitor He is on aspirin plus Plavix and statin.  For now continue medical management and avoid any further testing as his symptoms did not seem like true angina

## 2017-01-11 DIAGNOSIS — I2510 Atherosclerotic heart disease of native coronary artery without angina pectoris: Secondary | ICD-10-CM | POA: Diagnosis not present

## 2017-01-11 DIAGNOSIS — E1165 Type 2 diabetes mellitus with hyperglycemia: Secondary | ICD-10-CM | POA: Diagnosis not present

## 2017-01-11 DIAGNOSIS — I10 Essential (primary) hypertension: Secondary | ICD-10-CM | POA: Diagnosis not present

## 2017-01-11 DIAGNOSIS — E1140 Type 2 diabetes mellitus with diabetic neuropathy, unspecified: Secondary | ICD-10-CM | POA: Diagnosis not present

## 2017-01-11 DIAGNOSIS — E11649 Type 2 diabetes mellitus with hypoglycemia without coma: Secondary | ICD-10-CM | POA: Diagnosis not present

## 2017-03-27 DIAGNOSIS — Z6824 Body mass index (BMI) 24.0-24.9, adult: Secondary | ICD-10-CM | POA: Diagnosis not present

## 2017-03-27 DIAGNOSIS — Z794 Long term (current) use of insulin: Secondary | ICD-10-CM | POA: Diagnosis not present

## 2017-03-27 DIAGNOSIS — Z87891 Personal history of nicotine dependence: Secondary | ICD-10-CM | POA: Diagnosis not present

## 2017-03-27 DIAGNOSIS — I10 Essential (primary) hypertension: Secondary | ICD-10-CM | POA: Diagnosis not present

## 2017-03-27 DIAGNOSIS — N390 Urinary tract infection, site not specified: Secondary | ICD-10-CM | POA: Diagnosis not present

## 2017-03-27 DIAGNOSIS — I2510 Atherosclerotic heart disease of native coronary artery without angina pectoris: Secondary | ICD-10-CM | POA: Diagnosis not present

## 2017-03-27 DIAGNOSIS — Z951 Presence of aortocoronary bypass graft: Secondary | ICD-10-CM | POA: Diagnosis not present

## 2017-03-27 DIAGNOSIS — K529 Noninfective gastroenteritis and colitis, unspecified: Secondary | ICD-10-CM | POA: Diagnosis not present

## 2017-03-27 DIAGNOSIS — R1110 Vomiting, unspecified: Secondary | ICD-10-CM | POA: Diagnosis not present

## 2017-03-27 DIAGNOSIS — Z7902 Long term (current) use of antithrombotics/antiplatelets: Secondary | ICD-10-CM | POA: Diagnosis not present

## 2017-03-27 DIAGNOSIS — E860 Dehydration: Secondary | ICD-10-CM | POA: Diagnosis not present

## 2017-03-27 DIAGNOSIS — Z79899 Other long term (current) drug therapy: Secondary | ICD-10-CM | POA: Diagnosis not present

## 2017-03-27 DIAGNOSIS — E119 Type 2 diabetes mellitus without complications: Secondary | ICD-10-CM | POA: Diagnosis not present

## 2017-03-31 DIAGNOSIS — E860 Dehydration: Secondary | ICD-10-CM | POA: Diagnosis not present

## 2017-03-31 DIAGNOSIS — I2510 Atherosclerotic heart disease of native coronary artery without angina pectoris: Secondary | ICD-10-CM | POA: Diagnosis not present

## 2017-03-31 DIAGNOSIS — Z794 Long term (current) use of insulin: Secondary | ICD-10-CM | POA: Diagnosis not present

## 2017-03-31 DIAGNOSIS — Z951 Presence of aortocoronary bypass graft: Secondary | ICD-10-CM | POA: Diagnosis not present

## 2017-03-31 DIAGNOSIS — K529 Noninfective gastroenteritis and colitis, unspecified: Secondary | ICD-10-CM | POA: Diagnosis not present

## 2017-03-31 DIAGNOSIS — R0602 Shortness of breath: Secondary | ICD-10-CM | POA: Diagnosis not present

## 2017-03-31 DIAGNOSIS — Z87891 Personal history of nicotine dependence: Secondary | ICD-10-CM | POA: Diagnosis not present

## 2017-03-31 DIAGNOSIS — R109 Unspecified abdominal pain: Secondary | ICD-10-CM | POA: Diagnosis not present

## 2017-03-31 DIAGNOSIS — E119 Type 2 diabetes mellitus without complications: Secondary | ICD-10-CM | POA: Diagnosis not present

## 2017-03-31 DIAGNOSIS — Z79899 Other long term (current) drug therapy: Secondary | ICD-10-CM | POA: Diagnosis not present

## 2017-03-31 DIAGNOSIS — K219 Gastro-esophageal reflux disease without esophagitis: Secondary | ICD-10-CM | POA: Diagnosis not present

## 2017-03-31 DIAGNOSIS — R1084 Generalized abdominal pain: Secondary | ICD-10-CM | POA: Diagnosis not present

## 2017-03-31 DIAGNOSIS — I10 Essential (primary) hypertension: Secondary | ICD-10-CM | POA: Diagnosis not present

## 2017-04-17 DIAGNOSIS — E1165 Type 2 diabetes mellitus with hyperglycemia: Secondary | ICD-10-CM | POA: Diagnosis not present

## 2017-04-17 DIAGNOSIS — I10 Essential (primary) hypertension: Secondary | ICD-10-CM | POA: Diagnosis not present

## 2017-04-17 DIAGNOSIS — R5383 Other fatigue: Secondary | ICD-10-CM | POA: Diagnosis not present

## 2017-04-17 DIAGNOSIS — K219 Gastro-esophageal reflux disease without esophagitis: Secondary | ICD-10-CM | POA: Diagnosis not present

## 2017-04-17 DIAGNOSIS — E1140 Type 2 diabetes mellitus with diabetic neuropathy, unspecified: Secondary | ICD-10-CM | POA: Diagnosis not present

## 2017-04-20 DIAGNOSIS — E162 Hypoglycemia, unspecified: Secondary | ICD-10-CM | POA: Diagnosis not present

## 2017-04-20 DIAGNOSIS — E11649 Type 2 diabetes mellitus with hypoglycemia without coma: Secondary | ICD-10-CM | POA: Diagnosis not present

## 2017-04-20 DIAGNOSIS — I10 Essential (primary) hypertension: Secondary | ICD-10-CM | POA: Diagnosis not present

## 2017-04-20 DIAGNOSIS — E1165 Type 2 diabetes mellitus with hyperglycemia: Secondary | ICD-10-CM | POA: Diagnosis not present

## 2017-04-20 DIAGNOSIS — Z0001 Encounter for general adult medical examination with abnormal findings: Secondary | ICD-10-CM | POA: Diagnosis not present

## 2017-05-18 DIAGNOSIS — Z6824 Body mass index (BMI) 24.0-24.9, adult: Secondary | ICD-10-CM | POA: Diagnosis not present

## 2017-05-18 DIAGNOSIS — M25551 Pain in right hip: Secondary | ICD-10-CM | POA: Diagnosis not present

## 2017-05-18 DIAGNOSIS — M545 Low back pain: Secondary | ICD-10-CM | POA: Diagnosis not present

## 2017-05-18 DIAGNOSIS — M25552 Pain in left hip: Secondary | ICD-10-CM | POA: Diagnosis not present

## 2017-05-18 DIAGNOSIS — I10 Essential (primary) hypertension: Secondary | ICD-10-CM | POA: Diagnosis not present

## 2017-07-15 DIAGNOSIS — J440 Chronic obstructive pulmonary disease with acute lower respiratory infection: Secondary | ICD-10-CM | POA: Diagnosis not present

## 2017-07-15 DIAGNOSIS — Z6823 Body mass index (BMI) 23.0-23.9, adult: Secondary | ICD-10-CM | POA: Diagnosis not present

## 2017-09-03 DIAGNOSIS — R079 Chest pain, unspecified: Secondary | ICD-10-CM

## 2017-09-03 HISTORY — DX: Chest pain, unspecified: R07.9

## 2017-09-15 DIAGNOSIS — R079 Chest pain, unspecified: Secondary | ICD-10-CM | POA: Diagnosis not present

## 2017-09-15 DIAGNOSIS — Z7902 Long term (current) use of antithrombotics/antiplatelets: Secondary | ICD-10-CM | POA: Diagnosis not present

## 2017-09-15 DIAGNOSIS — I10 Essential (primary) hypertension: Secondary | ICD-10-CM | POA: Diagnosis not present

## 2017-09-15 DIAGNOSIS — M542 Cervicalgia: Secondary | ICD-10-CM | POA: Diagnosis not present

## 2017-09-15 DIAGNOSIS — E119 Type 2 diabetes mellitus without complications: Secondary | ICD-10-CM | POA: Diagnosis not present

## 2017-09-15 DIAGNOSIS — I25110 Atherosclerotic heart disease of native coronary artery with unstable angina pectoris: Secondary | ICD-10-CM | POA: Diagnosis not present

## 2017-09-15 DIAGNOSIS — Z7982 Long term (current) use of aspirin: Secondary | ICD-10-CM | POA: Diagnosis not present

## 2017-09-15 DIAGNOSIS — Z951 Presence of aortocoronary bypass graft: Secondary | ICD-10-CM | POA: Diagnosis not present

## 2017-09-15 DIAGNOSIS — R51 Headache: Secondary | ICD-10-CM | POA: Diagnosis not present

## 2017-09-15 DIAGNOSIS — Z87891 Personal history of nicotine dependence: Secondary | ICD-10-CM | POA: Diagnosis not present

## 2017-09-15 DIAGNOSIS — R4781 Slurred speech: Secondary | ICD-10-CM | POA: Diagnosis not present

## 2017-09-15 DIAGNOSIS — M25512 Pain in left shoulder: Secondary | ICD-10-CM | POA: Diagnosis not present

## 2017-09-15 DIAGNOSIS — I2510 Atherosclerotic heart disease of native coronary artery without angina pectoris: Secondary | ICD-10-CM | POA: Diagnosis not present

## 2017-09-15 DIAGNOSIS — Z79899 Other long term (current) drug therapy: Secondary | ICD-10-CM | POA: Diagnosis not present

## 2017-09-15 DIAGNOSIS — R42 Dizziness and giddiness: Secondary | ICD-10-CM | POA: Diagnosis not present

## 2017-09-15 DIAGNOSIS — Z955 Presence of coronary angioplasty implant and graft: Secondary | ICD-10-CM | POA: Diagnosis not present

## 2017-09-15 DIAGNOSIS — Z794 Long term (current) use of insulin: Secondary | ICD-10-CM | POA: Diagnosis not present

## 2017-09-15 DIAGNOSIS — M791 Myalgia, unspecified site: Secondary | ICD-10-CM | POA: Diagnosis not present

## 2017-09-16 ENCOUNTER — Encounter (HOSPITAL_COMMUNITY): Payer: Self-pay | Admitting: *Deleted

## 2017-09-16 ENCOUNTER — Inpatient Hospital Stay (HOSPITAL_COMMUNITY)
Admission: AD | Admit: 2017-09-16 | Discharge: 2017-09-19 | DRG: 287 | Disposition: A | Discharge status: Home / Self Care | Payer: Medicare Other | Source: Other Acute Inpatient Hospital | Attending: Internal Medicine | Admitting: Internal Medicine

## 2017-09-16 DIAGNOSIS — Z951 Presence of aortocoronary bypass graft: Secondary | ICD-10-CM | POA: Diagnosis not present

## 2017-09-16 DIAGNOSIS — R0789 Other chest pain: Secondary | ICD-10-CM | POA: Diagnosis present

## 2017-09-16 DIAGNOSIS — I208 Other forms of angina pectoris: Secondary | ICD-10-CM | POA: Diagnosis present

## 2017-09-16 DIAGNOSIS — E1151 Type 2 diabetes mellitus with diabetic peripheral angiopathy without gangrene: Secondary | ICD-10-CM | POA: Diagnosis not present

## 2017-09-16 DIAGNOSIS — Z794 Long term (current) use of insulin: Secondary | ICD-10-CM

## 2017-09-16 DIAGNOSIS — Z7951 Long term (current) use of inhaled steroids: Secondary | ICD-10-CM | POA: Diagnosis not present

## 2017-09-16 DIAGNOSIS — R001 Bradycardia, unspecified: Secondary | ICD-10-CM | POA: Diagnosis not present

## 2017-09-16 DIAGNOSIS — I2089 Other forms of angina pectoris: Secondary | ICD-10-CM | POA: Diagnosis present

## 2017-09-16 DIAGNOSIS — I10 Essential (primary) hypertension: Secondary | ICD-10-CM | POA: Diagnosis not present

## 2017-09-16 DIAGNOSIS — Z833 Family history of diabetes mellitus: Secondary | ICD-10-CM | POA: Diagnosis not present

## 2017-09-16 DIAGNOSIS — I25110 Atherosclerotic heart disease of native coronary artery with unstable angina pectoris: Secondary | ICD-10-CM | POA: Diagnosis not present

## 2017-09-16 DIAGNOSIS — Z91041 Radiographic dye allergy status: Secondary | ICD-10-CM | POA: Diagnosis not present

## 2017-09-16 DIAGNOSIS — I25700 Atherosclerosis of coronary artery bypass graft(s), unspecified, with unstable angina pectoris: Secondary | ICD-10-CM | POA: Diagnosis not present

## 2017-09-16 DIAGNOSIS — R51 Headache: Secondary | ICD-10-CM | POA: Diagnosis not present

## 2017-09-16 DIAGNOSIS — Z955 Presence of coronary angioplasty implant and graft: Secondary | ICD-10-CM

## 2017-09-16 DIAGNOSIS — Z7982 Long term (current) use of aspirin: Secondary | ICD-10-CM | POA: Diagnosis not present

## 2017-09-16 DIAGNOSIS — M791 Myalgia, unspecified site: Secondary | ICD-10-CM | POA: Diagnosis not present

## 2017-09-16 DIAGNOSIS — Z23 Encounter for immunization: Secondary | ICD-10-CM

## 2017-09-16 DIAGNOSIS — K219 Gastro-esophageal reflux disease without esophagitis: Secondary | ICD-10-CM | POA: Diagnosis not present

## 2017-09-16 DIAGNOSIS — R4781 Slurred speech: Secondary | ICD-10-CM | POA: Diagnosis not present

## 2017-09-16 DIAGNOSIS — E1169 Type 2 diabetes mellitus with other specified complication: Secondary | ICD-10-CM

## 2017-09-16 DIAGNOSIS — R079 Chest pain, unspecified: Secondary | ICD-10-CM | POA: Diagnosis not present

## 2017-09-16 DIAGNOSIS — R072 Precordial pain: Secondary | ICD-10-CM | POA: Diagnosis not present

## 2017-09-16 DIAGNOSIS — M25512 Pain in left shoulder: Secondary | ICD-10-CM | POA: Diagnosis not present

## 2017-09-16 DIAGNOSIS — Z87891 Personal history of nicotine dependence: Secondary | ICD-10-CM | POA: Diagnosis not present

## 2017-09-16 DIAGNOSIS — Z888 Allergy status to other drugs, medicaments and biological substances status: Secondary | ICD-10-CM

## 2017-09-16 DIAGNOSIS — E785 Hyperlipidemia, unspecified: Secondary | ICD-10-CM | POA: Diagnosis not present

## 2017-09-16 DIAGNOSIS — H409 Unspecified glaucoma: Secondary | ICD-10-CM | POA: Diagnosis not present

## 2017-09-16 DIAGNOSIS — Z7902 Long term (current) use of antithrombotics/antiplatelets: Secondary | ICD-10-CM

## 2017-09-16 DIAGNOSIS — Z79899 Other long term (current) drug therapy: Secondary | ICD-10-CM

## 2017-09-16 DIAGNOSIS — R42 Dizziness and giddiness: Secondary | ICD-10-CM | POA: Diagnosis not present

## 2017-09-16 DIAGNOSIS — M542 Cervicalgia: Secondary | ICD-10-CM | POA: Diagnosis not present

## 2017-09-16 DIAGNOSIS — I25118 Atherosclerotic heart disease of native coronary artery with other forms of angina pectoris: Secondary | ICD-10-CM | POA: Diagnosis present

## 2017-09-16 DIAGNOSIS — I25119 Atherosclerotic heart disease of native coronary artery with unspecified angina pectoris: Secondary | ICD-10-CM | POA: Diagnosis not present

## 2017-09-16 DIAGNOSIS — E119 Type 2 diabetes mellitus without complications: Secondary | ICD-10-CM

## 2017-09-16 DIAGNOSIS — I209 Angina pectoris, unspecified: Secondary | ICD-10-CM | POA: Diagnosis present

## 2017-09-16 HISTORY — DX: Chest pain, unspecified: R07.9

## 2017-09-16 LAB — GLUCOSE, CAPILLARY
GLUCOSE-CAPILLARY: 148 mg/dL — AB (ref 70–99)
Glucose-Capillary: 161 mg/dL — ABNORMAL HIGH (ref 70–99)
Glucose-Capillary: 69 mg/dL — ABNORMAL LOW (ref 70–99)

## 2017-09-16 MED ORDER — OMEGA-3-ACID ETHYL ESTERS 1 G PO CAPS
1.0000 g | ORAL_CAPSULE | Freq: Every day | ORAL | Status: DC
  Administered 2017-09-16 – 2017-09-19 (×4): 1 g via ORAL
  Filled 2017-09-16 (×4): qty 1

## 2017-09-16 MED ORDER — ACETAMINOPHEN 500 MG PO TABS: 1000.0000 mg | ORAL_TABLET | Freq: Four times a day (QID) | ORAL | Status: DC | PRN

## 2017-09-16 MED ORDER — FLUTICASONE PROPIONATE 50 MCG/ACT NA SUSP
1.0000 | Freq: Every day | NASAL | Status: DC
  Administered 2017-09-17: 1 via NASAL
  Filled 2017-09-16: qty 16

## 2017-09-16 MED ORDER — MORPHINE SULFATE (PF) 2 MG/ML IV SOLN
2.0000 mg | INTRAVENOUS | Status: DC | PRN
  Administered 2017-09-16 – 2017-09-17 (×2): 2 mg via INTRAVENOUS
  Filled 2017-09-16 (×2): qty 1

## 2017-09-16 MED ORDER — LATANOPROST 0.005 % OP SOLN
1.0000 [drp] | Freq: Every day | OPHTHALMIC | Status: DC
  Administered 2017-09-16: 1 [drp] via OPHTHALMIC
  Filled 2017-09-16: qty 2.5

## 2017-09-16 MED ORDER — DOXAZOSIN MESYLATE 2 MG PO TABS
2.0000 mg | ORAL_TABLET | Freq: Every day | ORAL | Status: DC
  Administered 2017-09-16 – 2017-09-18 (×2): 2 mg via ORAL
  Filled 2017-09-16 (×4): qty 1

## 2017-09-16 MED ORDER — CLOPIDOGREL BISULFATE 75 MG PO TABS
75.0000 mg | ORAL_TABLET | Freq: Every day | ORAL | Status: DC
  Administered 2017-09-17 – 2017-09-19 (×3): 75 mg via ORAL
  Filled 2017-09-16 (×3): qty 1

## 2017-09-16 MED ORDER — VITAMIN C 500 MG PO TABS
1000.0000 mg | ORAL_TABLET | Freq: Every day | ORAL | Status: DC
  Administered 2017-09-17 – 2017-09-19 (×3): 1000 mg via ORAL
  Filled 2017-09-16 (×3): qty 2

## 2017-09-16 MED ORDER — INSULIN GLARGINE 100 UNIT/ML SOLOSTAR PEN: 24.0000 [IU] | PEN_INJECTOR | Freq: Every day | SUBCUTANEOUS | Status: DC

## 2017-09-16 MED ORDER — BRIMONIDINE TARTRATE 0.15 % OP SOLN: 1.0000 [drp] | Freq: Three times a day (TID) | OPHTHALMIC | Status: DC

## 2017-09-16 MED ORDER — NITROGLYCERIN IN D5W 200-5 MCG/ML-% IV SOLN
0.0000 ug/min | INTRAVENOUS | Status: DC
  Administered 2017-09-16: 5 ug/min via INTRAVENOUS
  Administered 2017-09-18: 35 ug/min via INTRAVENOUS
  Filled 2017-09-16 (×2): qty 250

## 2017-09-16 MED ORDER — SODIUM CHLORIDE 0.9 % IV SOLN
INTRAVENOUS | Status: DC
  Administered 2017-09-16 – 2017-09-17 (×3): via INTRAVENOUS

## 2017-09-16 MED ORDER — ONDANSETRON HCL 4 MG/2ML IJ SOLN
4.0000 mg | Freq: Four times a day (QID) | INTRAMUSCULAR | Status: DC | PRN
  Administered 2017-09-16: 4 mg via INTRAVENOUS
  Filled 2017-09-16: qty 2

## 2017-09-16 MED ORDER — GABAPENTIN 300 MG PO CAPS
300.0000 mg | ORAL_CAPSULE | Freq: Three times a day (TID) | ORAL | Status: DC
  Administered 2017-09-16 – 2017-09-19 (×8): 300 mg via ORAL
  Filled 2017-09-16 (×8): qty 1

## 2017-09-16 MED ORDER — ATORVASTATIN CALCIUM 40 MG PO TABS: 40.0000 mg | ORAL_TABLET | Freq: Every day | ORAL | Status: DC

## 2017-09-16 MED ORDER — NITROGLYCERIN 0.4 MG SL SUBL
SUBLINGUAL_TABLET | SUBLINGUAL | Status: AC
  Filled 2017-09-16: qty 1

## 2017-09-16 MED ORDER — BRIMONIDINE TARTRATE 0.2 % OP SOLN
1.0000 [drp] | Freq: Three times a day (TID) | OPHTHALMIC | Status: DC
  Administered 2017-09-17 – 2017-09-19 (×7): 1 [drp] via OPHTHALMIC
  Filled 2017-09-16: qty 5

## 2017-09-16 MED ORDER — PANTOPRAZOLE SODIUM 40 MG PO TBEC
40.0000 mg | DELAYED_RELEASE_TABLET | Freq: Every day | ORAL | Status: DC
  Administered 2017-09-17 – 2017-09-19 (×3): 40 mg via ORAL
  Filled 2017-09-16 (×3): qty 1

## 2017-09-16 MED ORDER — ACETAMINOPHEN 325 MG PO TABS
650.0000 mg | ORAL_TABLET | ORAL | Status: DC | PRN
  Administered 2017-09-16 – 2017-09-18 (×2): 650 mg via ORAL
  Filled 2017-09-16 (×2): qty 2

## 2017-09-16 MED ORDER — INSULIN ASPART 100 UNIT/ML ~~LOC~~ SOLN
0.0000 [IU] | Freq: Every day | SUBCUTANEOUS | Status: DC
  Administered 2017-09-18: 3 [IU] via SUBCUTANEOUS

## 2017-09-16 MED ORDER — INSULIN GLARGINE 100 UNIT/ML ~~LOC~~ SOLN
24.0000 [IU] | Freq: Every day | SUBCUTANEOUS | Status: DC
  Administered 2017-09-17 – 2017-09-18 (×2): 24 [IU] via SUBCUTANEOUS
  Filled 2017-09-16 (×4): qty 0.24

## 2017-09-16 MED ORDER — SAW PALMETTO (SERENOA REPENS) 320 MG PO CAPS: 320.0000 mg | ORAL_CAPSULE | Freq: Every day | ORAL | Status: DC

## 2017-09-16 MED ORDER — HEPARIN (PORCINE) IN NACL 100-0.45 UNIT/ML-% IJ SOLN
900.0000 [IU]/h | INTRAMUSCULAR | Status: DC
  Administered 2017-09-16: 900 [IU]/h via INTRAVENOUS

## 2017-09-16 MED ORDER — BRINZOLAMIDE 1 % OP SUSP
1.0000 [drp] | Freq: Three times a day (TID) | OPHTHALMIC | Status: DC
  Administered 2017-09-16 – 2017-09-19 (×8): 1 [drp] via OPHTHALMIC
  Filled 2017-09-16: qty 10

## 2017-09-16 MED ORDER — TIMOLOL MALEATE 0.5 % OP SOLN
1.0000 [drp] | Freq: Every day | OPHTHALMIC | Status: DC
  Administered 2017-09-17: 1 [drp] via OPHTHALMIC
  Filled 2017-09-16: qty 5

## 2017-09-16 MED ORDER — VITAMIN D 1000 UNITS PO TABS
1000.0000 [IU] | ORAL_TABLET | Freq: Every day | ORAL | Status: DC
  Administered 2017-09-17 – 2017-09-19 (×3): 1000 [IU] via ORAL
  Filled 2017-09-16 (×3): qty 1

## 2017-09-16 MED ORDER — NITROGLYCERIN 0.4 MG SL SUBL
0.4000 mg | SUBLINGUAL_TABLET | SUBLINGUAL | Status: DC | PRN
  Administered 2017-09-16: 0.4 mg via SUBLINGUAL

## 2017-09-16 MED ORDER — LORATADINE 10 MG PO TABS: 10.0000 mg | ORAL_TABLET | Freq: Every day | ORAL | Status: DC | PRN

## 2017-09-16 MED ORDER — SACCHAROMYCES BOULARDII 250 MG PO CAPS
250.0000 mg | ORAL_CAPSULE | Freq: Two times a day (BID) | ORAL | Status: DC
  Administered 2017-09-16 – 2017-09-19 (×6): 250 mg via ORAL
  Filled 2017-09-16 (×6): qty 1

## 2017-09-16 MED ORDER — INFLUENZA VAC SPLIT HIGH-DOSE 0.5 ML IM SUSY
0.5000 mL | PREFILLED_SYRINGE | INTRAMUSCULAR | Status: AC
  Administered 2017-09-17: 0.5 mL via INTRAMUSCULAR
  Filled 2017-09-16: qty 0.5

## 2017-09-16 MED ORDER — ASPIRIN EC 81 MG PO TBEC
81.0000 mg | DELAYED_RELEASE_TABLET | Freq: Every day | ORAL | Status: DC
  Administered 2017-09-17 – 2017-09-19 (×2): 81 mg via ORAL
  Filled 2017-09-16 (×2): qty 1

## 2017-09-16 MED ORDER — INSULIN ASPART 100 UNIT/ML ~~LOC~~ SOLN
0.0000 [IU] | Freq: Three times a day (TID) | SUBCUTANEOUS | Status: DC
  Administered 2017-09-17 (×2): 1 [IU] via SUBCUTANEOUS
  Administered 2017-09-18 (×2): 2 [IU] via SUBCUTANEOUS
  Administered 2017-09-18: 7 [IU] via SUBCUTANEOUS
  Administered 2017-09-19 (×2): 2 [IU] via SUBCUTANEOUS

## 2017-09-16 MED ORDER — INSULIN ASPART 100 UNIT/ML FLEXPEN: 12.0000 [IU] | PEN_INJECTOR | Freq: Three times a day (TID) | SUBCUTANEOUS | Status: DC

## 2017-09-16 MED ORDER — ISOSORBIDE MONONITRATE ER 30 MG PO TB24
30.0000 mg | ORAL_TABLET | Freq: Every day | ORAL | Status: DC
  Administered 2017-09-17 – 2017-09-19 (×3): 30 mg via ORAL
  Filled 2017-09-16 (×3): qty 1

## 2017-09-16 MED ORDER — PREDNISOLONE ACETATE 1 % OP SUSP
1.0000 [drp] | Freq: Two times a day (BID) | OPHTHALMIC | Status: DC
  Administered 2017-09-17 – 2017-09-19 (×5): 1 [drp] via OPHTHALMIC
  Filled 2017-09-16: qty 5

## 2017-09-16 MED ORDER — DEXTROSE 50 % IV SOLN
INTRAVENOUS | Status: AC
  Administered 2017-09-16: 50 mL
  Filled 2017-09-16: qty 50

## 2017-09-16 NOTE — Progress Notes (Signed)
Amion service called about new pt. Amion called back and  Dr. Tyrell Antonio to admit patient.

## 2017-09-16 NOTE — Progress Notes (Signed)
ANTICOAGULATION CONSULT NOTE - Initial Consult  Pharmacy Consult for heparin Indication: chest pain/ACS  Allergies  Allergen Reactions  . Iohexol Other (See Comments)    Intractable shaking.  . Contrast Media [Iodinated Diagnostic Agents]     Shivering & shaking. Pt reports takes antihistamine prior to procedures.  . Metformin And Related Diarrhea    Subsequently discontinued    Patient Measurements: Height: 5\' 7"  (170.2 cm) Weight: 139 lb 9.5 oz (63.3 kg) IBW/kg (Calculated) : 66.1  Vital Signs: Temp: 97.8 F (36.6 C) (09/14 1700) Temp Source: Oral (09/14 1700) BP: 184/55 (09/14 1700) Pulse Rate: 61 (09/14 1700)  Labs: No results for input(s): HGB, HCT, PLT, APTT, LABPROT, INR, HEPARINUNFRC, HEPRLOWMOCWT, CREATININE, CKTOTAL, CKMB, TROPONINI in the last 72 hours.  CrCl cannot be calculated (Patient's most recent lab result is older than the maximum 21 days allowed.).  Assessment: 84 yo m transferring from [facility] on heparin 920 units/hr  Goal of Therapy:  Heparin level 0.3-0.7 units/ml Monitor platelets by anticoagulation protocol: Yes   Plan:  Adjust heparin to 900 units/hr 0000 hep lvl  Levester Fresh, PharmD, BCPS, BCCCP Clinical Pharmacist [phone]  Please check AMION for all [facility] numbers  09/16/2017 6:27 PM

## 2017-09-16 NOTE — Progress Notes (Signed)
PHARMACIST - PHYSICIAN ORDER COMMUNICATION  CONCERNING: P&T Medication Policy on Herbal Medications  DESCRIPTION:  This patient's order for:  Saw Palmetto  has been noted.  This product(s) is classified as an "herbal" or natural product. Due to a lack of definitive safety studies or FDA approval, nonstandard manufacturing practices, plus the potential risk of unknown drug-drug interactions while on inpatient medications, the Pharmacy and Therapeutics Committee does not permit the use of "herbal" or natural products of this type within [facility].   ACTION TAKEN: The pharmacy department is unable to verify this order at this time and your patient has been informed of this safety policy. Please reevaluate patient's clinical condition at discharge and address if the herbal or natural product(s) should be resumed at that time.  Doylene Canard, PharmD Clinical Pharmacist  Pager: [phone] Phone: [phone]

## 2017-09-16 NOTE — H&P (Signed)
History and Physical   [name] EVO:[id number] DOB: 08-Dec-1932 DOA: 09/16/2017  Referring MD/NP/PA: Benson Norway  PCP: Rory Percy, MD   Outpatient Specialists: Dr. Reece Levy, Cardiology  Patient coming from: [facility]  Chief Complaint: Chest pain  HPI: [name] is a 84 y.o. male with medical history significant of coronary artery disease status post coronary artery bypass grafting first in 1995 with a redo in 2001 and in 2016 with multiple subsequent cath and PCI with the last in 2016 at which point patient has been placed on medical management but all grafts were patent.  Patient being followed by Dr. Ellyn Hack.  Also has diabetes with hypertension.  Patient went to [facility] emergency room yesterday with persistent chest pain.  Pain was rated as 7-8 out of 10 in the left side of his chest and going all the way to his jaw.  The pain is consistent with his typical cardiac pain.  He was treated with nitroglycerin and morphine.  EKG was unchanged.  Serial enzymes were negative but patient continues to have chest pain.  Suspected unstable angina.  ER physician discussed with cardiologist over the phone and recommended transferring patient to [facility] where his cardiologist is.  He is still having chest pain on arrival rated as 5 out of 10.  He responded slightly to nitroglycerin.  Patient is being admitted to be evaluated by cardiology for unstable angina.   Review of Systems: As per HPI otherwise 10 point review of systems negative.    Past Medical History:  Diagnosis Date  . Asymptomatic stenosis of left vertebral artery 2002, 2005   Status post PTA/stent with redo; normal antegrade flow on dopplers 09/2013  . CAD (coronary artery disease) 1995   1CABG x3; redo in CABG x 1 2001 Free Radial to LAD; All grafts patent by Cath 08/2014: The proximal to mid LAD has poor retrograde filling from the LIMA due to in-stent restenosis. 50% ostial disease of free radial  artery to the LAD.  [name] CAD (coronary artery disease) of artery bypass graft 2000   PCI x 2 - ostial & prox-mid LAD (BMS) for atretic LIMA-LAD  . CAD in native artery 1995   CABG x 3 - LIMA-LAD, SVG-OM2, SVG-rPDA  . DM (diabetes mellitus) type II controlled peripheral vascular disorder 2002   Left subclavian and left vertebral artery stenoses  . Glaucoma   . Hyperlipidemia LDL goal <70   . Hypertension, essential   . S/P CABG x 3 1995   . S/P Redo CABG x 1 2001   L Radial-LAD after 2 failed attempts @ LIMA-LAD PTCA; LAD stents 100% occluded  . Stenosis of left subclavian artery ([place]) 11/2003   Status post PTA/stent --> < 50 % stenosis by Dopplers 09/2013    Past Surgical History:  Procedure Laterality Date  . CARDIAC CATHETERIZATION N/A 08/04/2014   Procedure: Left Heart Cath and Cors/Grafts Angiography;  Surgeon: Leonie Man, MD;  Location: [place] [facility];  Service: Cardiovascular: o-mRCA 70%, m-dRCA ~70% - SVG-dRCA Patent.  o-pLAD 100% stent occluded, mLAD 95% ISR with poor retrograde filling from freeRadiial graft-LAD (50% ostial graft dz).  o-p Cx 80%. Widely patent SVG-Cx-OM fills retrograde to 80% lesion.; Normal LV Fxn & EDP  . CORONARY ANGIOPLASTY  April and May 2001   After Both LAD stents occluded - PTCA of anastomatic LIMA-LAD lesion -- Unsuccessful.  . CORONARY ARTERY BYPASS GRAFT  1995    LIMA-LAD, SVG-OM2, SVG-rPDA  .  CORONARY ARTERY BYPASS GRAFT  June 2001   Dr. Lucianne Lei Trigt: Redo LAD grafting with free Left Radial-distal LAD  . CORONARY STENT PLACEMENT  1995-2000   2 BMS stents to osital-proximal & proximal-mid LAD; because of atretic LIMA-LDA  . LEFT HEART CATHETERIZATION WITH CORONARY/GRAFT ANGIOGRAM N/A 08/22/2011   Procedure: LEFT HEART CATHETERIZATION WITH Beatrix Fetters;  Surgeon: Leonie Man, MD;  Location: [facility] CATH LAB;  Service: Cardiovascular:  Known occluded LIMA-LAD and ostial LAD. Moderate to severe proximal circumflex and RCA disease. Widely  patent freeLRAD-dLAD, as well as SVG-RPDA (backfilling RPL), SVG-OM 2 (backfilling OM1)  . SHOULDER OPEN ROTATOR CUFF REPAIR  October 2004   Dr. Noemi Chapel  . SP Ninfa Meeker OR THOR CAROTID STENT Left October 2002; November 2005   Left Vertebral stent placed in October 2002 (Dr. Patrecia Pour); redo PCI in 2005  . SUBCLAVIAN ARTERY STENT Left November 2005   Dr. Gwenlyn Found     reports that he has quit smoking. His smoking use included cigarettes. He has a 90.00 pack-year smoking history. He has never used smokeless tobacco. He reports that he does not drink alcohol or use drugs.  Allergies  Allergen Reactions  . Iohexol Other (See Comments)    Intractable shaking.  . Contrast Media [Iodinated Diagnostic Agents]     Shivering & shaking. Pt reports takes antihistamine prior to procedures.  . Metformin And Related Diarrhea    Subsequently discontinued    Family History  Problem Relation Age of Onset  . Diabetes Mother   . Diabetes Sister   . Diabetes Brother      Prior to Admission medications   Medication Sig Start Date End Date Taking? Authorizing Provider  acetaminophen (TYLENOL) 500 MG tablet Take 1,000 mg by mouth every 6 (six) hours as needed for mild pain or headache.    [provider]  Ascorbic Acid (VITAMIN C) 1000 MG tablet Take 1,000 mg by mouth daily.    [provider]  aspirin EC 81 MG tablet Take 81 mg by mouth daily.    [provider]  brimonidine (ALPHAGAN P) 0.1 % SOLN Place 1 drop into the right eye 3 (three) times daily.     [provider]  Cholecalciferol (VITAMIN D-3) 1000 UNITS CAPS Take 1,000 Units by mouth daily.    [provider]  clopidogrel (PLAVIX) 75 MG tablet Take 1 tablet (75 mg total) by mouth daily. 10/24/12   Leonie Man, MD  doxazosin (CARDURA) 4 MG tablet Take 2 mg by mouth at bedtime.     [provider]  fluticasone (FLONASE) 50 MCG/ACT nasal spray Place 1 spray into both nostrils daily.  11/17/14   [provider]  gabapentin (NEURONTIN) 300 MG capsule Take 300 mg by mouth 3 (three) times daily.    [provider]  GLOBAL INJECT EASE LANCETS 30G MISC DIABETIC CLUB - USE TO CHECK BLOOD GLUCOSE LEVELS 02/23/15   [provider]  glucose blood test strip DIABETIC CLUB - USE TO CHECK BLOOD GLUCOSE LEVELS 02/23/15   [provider]  Insulin Aspart (NOVOLOG FLEXPEN [place]) Inject 12 Units 3 (three) times daily into the skin. Morning ,lunch time, night time     [provider]  Insulin Glargine (LANTUS SOLOSTAR) 100 UNIT/ML Solostar Pen Inject 24 Units daily at 10 pm into the skin.     [provider]  isosorbide mononitrate (IMDUR) 60 MG 24 hr tablet May take 1 and 1/2 tablets to 2 tablets a day  depending on if chest discomfort is present 11/21/16   Leonie Man, MD  latanoprost (XALATAN) 0.005 % ophthalmic solution Place 1 drop into both eyes at bedtime.     [provider]  loratadine (CLARITIN) 10 MG tablet Take 10 mg by mouth daily as needed for allergies. Forb  Seasonal allergies     [provider]  Omega-3 Fatty Acids (FISH OIL) 300 MG CAPS Take 300 mg by mouth 2 (two) times daily.     [provider]  omeprazole (PRILOSEC) 20 MG capsule Take 20 mg by mouth daily.    [provider]  saccharomyces boulardii (FLORASTOR) 250 MG capsule Take 1 capsule (250 mg total) by mouth 2 (two) times daily. 08/04/14   Ghimire, Henreitta Leber, MD  Saw Palmetto, Serenoa repens, 320 MG CAPS Take 320 mg by mouth daily.     [provider]  simvastatin (ZOCOR) 80 MG tablet Take 80 mg by mouth daily.    [provider]  timolol (TIMOPTIC) 0.5 % ophthalmic solution Place 1 drop into the left eye 3 (three) times daily.     [provider]    Physical Exam: Vitals:   09/16/17 1700  BP: (!) 184/55  Pulse: 61  Temp: 97.8 F (36.6 C)  TempSrc: Oral  SpO2: 97%  Weight: 63.3 kg  Height: 5\' 7"   (1.702 m)      Constitutional: NAD, calm, comfortable Vitals:   09/16/17 1700  BP: (!) 184/55  Pulse: 61  Temp: 97.8 F (36.6 C)  TempSrc: Oral  SpO2: 97%  Weight: 63.3 kg  Height: 5\' 7"  (1.702 m)   Eyes: PERRL, lids and conjunctivae normal ENMT: Mucous membranes are moist. Posterior pharynx clear of any exudate or lesions.Normal dentition.  Neck: normal, supple, no masses, no thyromegaly Respiratory: clear to auscultation bilaterally, no wheezing, no crackles. Normal respiratory effort. No accessory muscle use.  Cardiovascular: Regular rate and rhythm, no murmurs / rubs / gallops. No extremity edema. 2+ pedal pulses. No carotid bruits.  Abdomen: no tenderness, no masses palpated. No hepatosplenomegaly. Bowel sounds positive.  Musculoskeletal: no clubbing / cyanosis. No joint deformity upper and lower extremities. Good ROM, no contractures. Normal muscle tone.  Skin: no rashes, lesions, ulcers. No induration Neurologic: CN 2-12 grossly intact. Sensation intact, DTR normal. Strength 5/5 in all 4.  Psychiatric: Normal judgment and insight. Alert and oriented x 3. Normal mood.     Labs on Admission: I have personally reviewed following labs and imaging studies  CBC: No results for input(s): WBC, NEUTROABS, HGB, HCT, MCV, PLT in the last 168 hours. Basic Metabolic Panel: No results for input(s): NA, K, CL, CO2, GLUCOSE, BUN, CREATININE, CALCIUM, MG, PHOS in the last 168 hours. GFR: CrCl cannot be calculated (Patient's most recent lab result is older than the maximum 21 days allowed.). Liver Function Tests: No results for input(s): AST, ALT, ALKPHOS, BILITOT, PROT, ALBUMIN in the last 168 hours. No results for input(s): LIPASE, AMYLASE in the last 168 hours. No results for input(s): AMMONIA in the last 168 hours. Coagulation Profile: No results for input(s): INR, PROTIME in the last 168 hours. Cardiac Enzymes: No results for input(s): CKTOTAL, CKMB, CKMBINDEX, TROPONINI in the  last 168 hours. BNP (last 3 results) No results for input(s): PROBNP in the last 8760 hours. HbA1C: No results for input(s): HGBA1C in the last 72 hours. CBG: Recent Labs  Lab 09/16/17 1801  GLUCAP 161*   Lipid Profile: No results for input(s): CHOL, HDL, LDLCALC,  TRIG, CHOLHDL, LDLDIRECT in the last 72 hours. Thyroid Function Tests: No results for input(s): TSH, T4TOTAL, FREET4, T3FREE, THYROIDAB in the last 72 hours. Anemia Panel: No results for input(s): VITAMINB12, FOLATE, FERRITIN, TIBC, IRON, RETICCTPCT in the last 72 hours. Urine analysis:    Component Value Date/Time   COLORURINE YELLOW 10/16/2015 [place] 10/16/2015 1606   LABSPEC 1.010 10/16/2015 1606   PHURINE 5.5 10/16/2015 1606   GLUCOSEU >1000 (A) 10/16/2015 1606   HGBUR NEGATIVE 10/16/2015 1606   BILIRUBINUR NEGATIVE 10/16/2015 1606   KETONESUR 15 (A) 10/16/2015 1606   PROTEINUR NEGATIVE 10/16/2015 1606   UROBILINOGEN 1.0 05/22/2009 2346   NITRITE NEGATIVE 10/16/2015 1606   LEUKOCYTESUR NEGATIVE 10/16/2015 1606   Sepsis Labs: @LABRCNTIP (procalcitonin:4,lacticidven:4) )No results found for this or any previous visit (from the past 240 hour(s)).   Radiological Exams on Admission: No results found.  EKG: Independently reviewed.  Shows sinus bradycardia with a rate of 54, ST depression mildly in the lateral leads looks unchanged from previous EKG.  Assessment/Plan Principal Problem:   Angina at rest [facility]) Active Problems:   Essential hypertension   DM2 (diabetes mellitus, type 2) - with CAD   CAD -> CABG x3 then Re-DO CABG x1 (LRad-dLAD) after atretic LIMA & LAD stent occlusion.   GERD (gastroesophageal reflux disease)   Ischemic chest pain     #1 Unstable angina: Suspected unstable angina.  Cardiac enzymes still remain negative.  Patient will be admitted to stepdown bed.  IV heparin and IV nitroglycerin ordered.  Continue aspirin and statin.  Cardiology consult for possible  evaluation.  Continue Plavix also  #2 essential hypertension: Continue medical management.  #3 diabetes: Continue sliding scale insulin with home regimen.  #4 GERD: Continue with PPIs.  #5 peripheral arterial disease: Continue statin.  DVT prophylaxis: Heparin Code Status: Full code Family Communication: Wife who is with patient Disposition Plan: Home Consults called: Cardiology, Dr. Ellyn Hack to be consulted also Admission status: Inpatient  Severity of Illness: The appropriate patient status for this patient is INPATIENT. Inpatient status is judged to be reasonable and necessary in order to provide the required intensity of service to ensure the patient's safety. The patient's presenting symptoms, physical exam findings, and initial radiographic and laboratory data in the context of their chronic comorbidities is felt to place them at high risk for further clinical deterioration. Furthermore, it is not anticipated that the patient will be medically stable for discharge from the hospital within 2 midnights of admission. The following factors support the patient status of inpatient.   " The patient's presenting symptoms include chest pain. " The worrisome physical exam findings include no significant abnormalities found. " The initial radiographic and laboratory data are worrisome because of normal enzymes and EKG. " The chronic co-morbidities include coronary artery disease.   * I certify that at the point of admission it is my clinical judgment that the patient will require inpatient hospital care spanning beyond 2 midnights from the point of admission due to high intensity of service, high risk for further deterioration and high frequency of surveillance required.Barbette Merino MD [facility] Pager [phone]  If 7PM-7AM, please contact night-coverage [email or web address] Password [email or web address]  09/16/2017, 6:20 PM

## 2017-09-16 NOTE — Progress Notes (Signed)
Hypoglycemic Event  CBG: 69  Treatment: dextrose 50% solution, orange juice   Symptoms: diaphoretic   Follow-up CBG: Time:2153 CBG Result:148  Possible Reasons for Event: patient self administered an increased dose of insulin.   Comments/MD notified: MD gave a verbal orders to hold all insulin.     Myrta Mercer

## 2017-09-16 NOTE — Progress Notes (Signed)
Patient stated increased chest pain. He had been given morphine at 1905 with some relief. Pt rated chest pain 6/10. I gave 1 nitroglycerin with cp relief. MD at bedside, spoke about increased cp. MD gave orders to start nitro gtt at 23mcg/min. Patient stated feeling better after ntiro gtt was started. Will continue to monitor.

## 2017-09-17 DIAGNOSIS — I25110 Atherosclerotic heart disease of native coronary artery with unstable angina pectoris: Principal | ICD-10-CM

## 2017-09-17 DIAGNOSIS — E119 Type 2 diabetes mellitus without complications: Secondary | ICD-10-CM

## 2017-09-17 DIAGNOSIS — I25700 Atherosclerosis of coronary artery bypass graft(s), unspecified, with unstable angina pectoris: Secondary | ICD-10-CM

## 2017-09-17 DIAGNOSIS — I10 Essential (primary) hypertension: Secondary | ICD-10-CM

## 2017-09-17 LAB — HEPARIN LEVEL (UNFRACTIONATED)
HEPARIN UNFRACTIONATED: 0.78 [IU]/mL — AB (ref 0.30–0.70)
Heparin Unfractionated: 0.77 IU/mL — ABNORMAL HIGH (ref 0.30–0.70)
Heparin Unfractionated: 0.67 IU/mL (ref 0.30–0.70)

## 2017-09-17 LAB — GLUCOSE, CAPILLARY
GLUCOSE-CAPILLARY: 132 mg/dL — AB (ref 70–99)
GLUCOSE-CAPILLARY: 129 mg/dL — AB (ref 70–99)
GLUCOSE-CAPILLARY: 197 mg/dL — AB (ref 70–99)
Glucose-Capillary: 102 mg/dL — ABNORMAL HIGH (ref 70–99)

## 2017-09-17 LAB — CBC
HCT: 37.4 % — ABNORMAL LOW (ref 39.0–52.0)
Hemoglobin: 12.9 g/dL — ABNORMAL LOW (ref 13.0–17.0)
MCH: 30.6 pg (ref 26.0–34.0)
MCHC: 34.5 g/dL (ref 30.0–36.0)
MCV: 88.8 fL (ref 78.0–100.0)
PLATELETS: 178 10*3/uL (ref 150–400)
RBC: 4.21 MIL/uL — AB (ref 4.22–5.81)
RDW: 12 % (ref 11.5–15.5)
WBC: 6.9 10*3/uL (ref 4.0–10.5)

## 2017-09-17 LAB — TROPONIN I: Troponin I: <0.03 ng/mL (ref <0.03)

## 2017-09-17 MED ORDER — HEPARIN (PORCINE) IN NACL 100-0.45 UNIT/ML-% IJ SOLN
750.0000 [IU]/h | INTRAMUSCULAR | Status: DC
  Administered 2017-09-17: 750 [IU]/h via INTRAVENOUS
  Filled 2017-09-17: qty 250

## 2017-09-17 MED ORDER — LATANOPROST 0.005 % OP SOLN
1.0000 [drp] | Freq: Every day | OPHTHALMIC | Status: DC
  Administered 2017-09-17 – 2017-09-18 (×2): 1 [drp] via OPHTHALMIC
  Filled 2017-09-17: qty 2.5

## 2017-09-17 MED ORDER — ATORVASTATIN CALCIUM 80 MG PO TABS
80.0000 mg | ORAL_TABLET | Freq: Every day | ORAL | Status: DC
  Administered 2017-09-17 – 2017-09-18 (×2): 80 mg via ORAL
  Filled 2017-09-17 (×2): qty 1

## 2017-09-17 MED ORDER — CYCLOBENZAPRINE HCL 10 MG PO TABS
5.0000 mg | ORAL_TABLET | Freq: Three times a day (TID) | ORAL | Status: DC
  Administered 2017-09-17 – 2017-09-19 (×6): 5 mg via ORAL
  Filled 2017-09-17 (×7): qty 1

## 2017-09-17 MED ORDER — MORPHINE SULFATE (PF) 2 MG/ML IV SOLN
2.0000 mg | INTRAVENOUS | Status: DC | PRN
  Administered 2017-09-17: 2 mg via INTRAVENOUS
  Filled 2017-09-17: qty 1

## 2017-09-17 MED ORDER — HYDRALAZINE HCL 20 MG/ML IJ SOLN
5.0000 mg | INTRAMUSCULAR | Status: DC | PRN
  Administered 2017-09-18: 5 mg via INTRAVENOUS
  Filled 2017-09-17 (×3): qty 1

## 2017-09-17 MED ORDER — TIMOLOL MALEATE 0.5 % OP SOLN
1.0000 [drp] | Freq: Every day | OPHTHALMIC | Status: DC
  Administered 2017-09-18 – 2017-09-19 (×2): 1 [drp] via OPHTHALMIC
  Filled 2017-09-17: qty 5

## 2017-09-17 NOTE — Progress Notes (Signed)
[place] for heparin Indication: chest pain/ACS  Allergies  Allergen Reactions  . Iohexol Other (See Comments)    Intractable shaking.  . Contrast Media [Iodinated Diagnostic Agents]     Shivering & shaking. Pt reports takes antihistamine prior to procedures.  . Metformin And Related Diarrhea    Subsequently discontinued    Patient Measurements: Height: 5\' 7"  (170.2 cm) Weight: 139 lb 11.2 oz (63.4 kg) IBW/kg (Calculated) : 66.1  Heparin dosing weight: 63.3 kg  Vital Signs: Temp: 98.1 F (36.7 C) (09/15 0535) Temp Source: Oral (09/15 0535) BP: 156/53 (09/15 0535) Pulse Rate: 63 (09/15 0535)  Labs: Recent Labs    09/17/17 0101 09/17/17 0704 09/17/17 1047 09/17/17 1313  HGB 12.9*  --   --   --   HCT 37.4*  --   --   --   PLT 178  --   --   --   HEPARINUNFRC 0.77* 0.78*  --  0.67  TROPONINI  --   --  <0.03  --     CrCl cannot be calculated (Patient's most recent lab result is older than the maximum 21 days allowed.).  Assessment: 84 yo m transferred from [facility] on heparin for chest pain/ACS.  Heparin level is therapeutic at higher end of goal at 0.67, on 800 units/hr. Hgb 12.9, plt 178. No infusion issues. No s/sx of bleeding. Not on anticoag PTA.   Goal of Therapy:  Heparin level 0.3-0.7 units/ml Monitor platelets by anticoagulation protocol: Yes   Plan:  Decrease heparin infusion slightly to 750 units/hr Daily heparin level, CBC.  Doylene Canard, PharmD Clinical Pharmacist  Pager: [phone] Phone: [phone] Please check AMION for all [facility] numbers 09/17/2017 1:58 PM

## 2017-09-17 NOTE — Consult Note (Addendum)
Cardiology Consultation:   Patient ID: LOGUN COLAVITO MRN: [id number]; DOB: September 26, 1932  Admit date: 09/16/2017 Date of Consult: 09/17/2017  Primary Care Provider: Rory Percy, MD Primary Cardiologist: Glenetta Hew, MD  Primary Electrophysiologist:  None    Patient Profile:   [name] is a 84 y.o. male with a hx of CAD and CABG 1995 and redo 2001, chronic stable angina, last cath 2016 no PCI option medical therapy recommended, could not afford Imdur, DM-2, HLD, PAD with stent to Lt subclavian artery who is being seen today for the evaluation of chest pain at the request of Dr. Wyline Copas after pt transferred from [facility] .  History of Present Illness:   Mr. [name] with cardiac hx of CABG 1995 and redo CABG 2001 with free Lt radial to the LAD after distal difficulty to LIMA to LAD and multiple PCIs.   last cath  08/2014 with ost RCA to mRCA 70% stenosed, mRCA to dRCA 70% stenosed,  Ost LAD to Prox LAD stented segment 100% stenosed. Mid LAD to Dist LAD stented segment 95% stenosed (visualized via retrograde filling from Radial Graft.  Widely patent SVG-distal RCA with Retrograde flow almost all the way up to the proximal 70% stenosis of the native RCA . Antegrade flow fills the entire posterolateral system with minimal disease, Ost Cx to Prox Cx lesion, 80% stenosed. Widely patent SVG-CxOM. Retrograde flow from the native circumflex in the graft fills the entire graft back to the ostium. Roughly 50% ostial stenosis of the free radial artery graft to the LAD. Normal LV function and EDP   Pt has intolerance to BB due to bradycardia.    Now admitted 9/112/19 with chest pain --left chest with radiation to jaw.  Pain was 7-8/10 and on arrival at [facility] was 5/10.  Placed on IV heparin and NTG for ACS.   Pt was working on [employer] and developed chest pain with radiation to Lt arm and jaw neck.  NTG seems to help.   Prior to Wed he only used rare NTG.   EKG SB at 59 possible lat ischemia but  compared to prior EKGs no acute changes.  I personally reviewed.  EKG from [facility] with the same   From [facility] Troponin T <0.01  X 3 Hgb 14.5 plts 187, WBC 8.6 Na 137, K+ 3.8 BUN 13 Cr 0.90 Mg 1.8, AST 26, ALT 29,   BP elevated at times 193/68  Currently sitting up in bed rocking with pain.  Both chest which is a squeezing pain and back pain.  NTG seems to help chest pain and not morphine.  The back pain just started.  He has hx of back pain.   It is much worse than his usual angina.  On IV NTG at 3 cc will increase and give IV morphine with his back pain.    Past Medical History:  Diagnosis Date  . Asymptomatic stenosis of left vertebral artery 2002, 2005   Status post PTA/stent with redo; normal antegrade flow on dopplers 09/2013  . CAD (coronary artery disease) 1995   1CABG x3; redo in CABG x 1 2001 Free Radial to LAD; All grafts patent by Cath 08/2014: The proximal to mid LAD has poor retrograde filling from the LIMA due to in-stent restenosis. 50% ostial disease of free radial artery to the LAD.  [name] CAD (coronary artery disease) of artery bypass graft 2000   PCI x 2 - ostial & prox-mid LAD (BMS) for atretic LIMA-LAD  .  CAD in native artery 1995   CABG x 3 - LIMA-LAD, SVG-OM2, SVG-rPDA  . DM (diabetes mellitus) type II controlled peripheral vascular disorder 2002   Left subclavian and left vertebral artery stenoses  . Glaucoma   . Hyperlipidemia LDL goal <70   . Hypertension, essential   . S/P CABG x 3 1995   . S/P Redo CABG x 1 2001   L Radial-LAD after 2 failed attempts @ LIMA-LAD PTCA; LAD stents 100% occluded  . Stenosis of left subclavian artery ([place]) 11/2003   Status post PTA/stent --> < 50 % stenosis by Dopplers 09/2013    Past Surgical History:  Procedure Laterality Date  . CARDIAC CATHETERIZATION N/A 08/04/2014   Procedure: Left Heart Cath and Cors/Grafts Angiography;  Surgeon: Leonie Man, MD;  Location: [place] [facility];  Service: Cardiovascular:  o-mRCA 70%, m-dRCA ~70% - SVG-dRCA Patent.  o-pLAD 100% stent occluded, mLAD 95% ISR with poor retrograde filling from freeRadiial graft-LAD (50% ostial graft dz).  o-p Cx 80%. Widely patent SVG-Cx-OM fills retrograde to 80% lesion.; Normal LV Fxn & EDP  . CORONARY ANGIOPLASTY  April and May 2001   After Both LAD stents occluded - PTCA of anastomatic LIMA-LAD lesion -- Unsuccessful.  . CORONARY ARTERY BYPASS GRAFT  1995    LIMA-LAD, SVG-OM2, SVG-rPDA  . CORONARY ARTERY BYPASS GRAFT  June 2001   Dr. Lucianne Lei Trigt: Redo LAD grafting with free Left Radial-distal LAD  . CORONARY STENT PLACEMENT  1995-2000   2 BMS stents to osital-proximal & proximal-mid LAD; because of atretic LIMA-LDA  . LEFT HEART CATHETERIZATION WITH CORONARY/GRAFT ANGIOGRAM N/A 08/22/2011   Procedure: LEFT HEART CATHETERIZATION WITH Beatrix Fetters;  Surgeon: Leonie Man, MD;  Location: [facility] CATH LAB;  Service: Cardiovascular:  Known occluded LIMA-LAD and ostial LAD. Moderate to severe proximal circumflex and RCA disease. Widely patent freeLRAD-dLAD, as well as SVG-RPDA (backfilling RPL), SVG-OM 2 (backfilling OM1)  . SHOULDER OPEN ROTATOR CUFF REPAIR  October 2004   Dr. Noemi Chapel  . SP Ninfa Meeker OR THOR CAROTID STENT Left October 2002; November 2005   Left Vertebral stent placed in October 2002 (Dr. Patrecia Pour); redo PCI in 2005  . SUBCLAVIAN ARTERY STENT Left November 2005   Dr. Gwenlyn Found     Home Medications:  Prior to Admission medications   Medication Sig Start Date End Date Taking? Authorizing Provider  acetaminophen (TYLENOL) 500 MG tablet Take 1,000 mg by mouth every 6 (six) hours as needed for mild pain or headache.   Yes [provider]  acyclovir (ZOVIRAX) 400 MG tablet Take 400 mg by mouth 2 (two) times daily with a meal.  08/18/17  Yes [provider]  aspirin EC 81 MG tablet Take 81 mg by mouth daily with breakfast.    Yes [provider]  Brinzolamide-Brimonidine (SIMBRINZA) 1-0.2  % SUSP Place 1 drop into both eyes 3 (three) times daily.   Yes [provider]  Cholecalciferol (VITAMIN D) 2000 units tablet Take 2,000 Units by mouth daily with breakfast.   Yes [provider]  clopidogrel (PLAVIX) 75 MG tablet Take 1 tablet (75 mg total) by mouth daily. Patient taking differently: Take 75 mg by mouth daily with breakfast.  10/24/12  Yes Leonie Man, MD  doxazosin (CARDURA) 8 MG tablet Take 8 mg by mouth at bedtime.    Yes [provider]  fluticasone (FLONASE) 50 MCG/ACT nasal spray Place 1 spray into both nostrils daily as needed (seasonal allergies).  11/17/14  Yes [provider]  gabapentin (NEURONTIN) 300 MG capsule Take 600 mg by mouth See admin instructions. Take two capsules (600 mg) by mouth three times daily - breakfast, supper and bedtime   Yes [provider]  hydrocortisone cream 1 % Apply 1 application topically daily as needed for itching.   Yes [provider]  Insulin Aspart (NOVOLOG FLEXPEN [place]) Inject 16 Units into the skin 3 (three) times daily before meals.    Yes [provider]  Insulin Glargine (LANTUS SOLOSTAR) 100 UNIT/ML Solostar Pen Inject 26 Units into the skin at bedtime.    Yes [provider]  latanoprost (XALATAN) 0.005 % ophthalmic solution Place 1 drop into the right eye at bedtime.    Yes [provider]  loratadine (CLARITIN) 10 MG tablet Take 10 mg by mouth daily as needed (seasonal allergies).    Yes [provider]  Omega-3 Fatty Acids (FISH OIL) 1000 MG CAPS Take 1,000 mg by mouth 2 (two) times daily with a meal.    Yes [provider]  pantoprazole (PROTONIX) 40 MG tablet Take 40 mg by mouth daily with breakfast. 09/14/17  Yes [provider]  prednisoLONE acetate (PRED FORTE) 1 % ophthalmic suspension Place 1 drop into the left eye 2 (two) times daily.   Yes [provider]  simvastatin (ZOCOR) 80 MG tablet Take 40 mg  by mouth at bedtime.    Yes [provider]  timolol (TIMOPTIC) 0.5 % ophthalmic solution Place 1 drop into both eyes daily.    Yes [provider]  vitamin C (ASCORBIC ACID) 500 MG tablet Take 500 mg by mouth 2 (two) times daily with a meal.    Yes [provider]  GLOBAL INJECT EASE LANCETS 30G MISC DIABETIC CLUB - USE TO CHECK BLOOD GLUCOSE LEVELS 02/23/15   [provider]  glucose blood test strip DIABETIC CLUB - USE TO CHECK BLOOD GLUCOSE LEVELS 02/23/15   [provider]  isosorbide mononitrate (IMDUR) 60 MG 24 hr tablet May take 1 and 1/2 tablets to 2 tablets a day depending on if chest discomfort is present Patient not taking: Reported on 09/16/2017 11/21/16   Leonie Man, MD    Inpatient Medications: Scheduled Meds: . aspirin EC  81 mg Oral Daily  . atorvastatin  40 mg Oral q1800  . brinzolamide  1 drop Both Eyes TID   And  . brimonidine  1 drop Both Eyes TID  . cholecalciferol  1,000 Units Oral Daily  . clopidogrel  75 mg Oral Daily  . doxazosin  2 mg Oral QHS  . fluticasone  1 spray Each Nare Daily  . gabapentin  300 mg Oral TID  . insulin aspart  0-5 Units Subcutaneous QHS  . insulin aspart  0-9 Units Subcutaneous TID WC  . insulin glargine  24 Units Subcutaneous QHS  . isosorbide mononitrate  30 mg Oral Daily  . latanoprost  1 drop Both Eyes QHS  . omega-3 acid ethyl esters  1 g Oral Daily  . pantoprazole  40 mg Oral Daily  . prednisoLONE acetate  1 drop Left Eye BID  . saccharomyces boulardii  250 mg Oral BID  . timolol  1 drop Left Eye Daily  . vitamin C  1,000 mg Oral Daily   Continuous Infusions: . sodium chloride 50 mL/hr at 09/17/17 0400  . heparin 800 Units/hr (09/17/17 0315)  . nitroGLYCERIN 10 mcg/min (09/17/17 0655)   PRN Meds: acetaminophen, loratadine, morphine injection,  nitroGLYCERIN, ondansetron (ZOFRAN) IV  Allergies:    Allergies  Allergen Reactions  . Iohexol Other (See Comments)    Intractable  shaking.  . Contrast Media [Iodinated Diagnostic Agents]     Shivering & shaking. Pt reports takes antihistamine prior to procedures.  . Metformin And Related Diarrhea    Subsequently discontinued    Social History:   Social History   Socioeconomic History  . Marital status: Married    Spouse name: Not on file  . Number of children: Not on file  . Years of education: Not on file  . Highest education level: Not on file  Occupational History  . Not on file  Social Needs  . Financial resource strain: Not on file  . Food insecurity:    Worry: Not on file    Inability: Not on file  . Transportation needs:    Medical: Not on file    Non-medical: Not on file  Tobacco Use  . Smoking status: Former Smoker    Packs/day: 3.00    Years: 30.00    Pack years: 90.00    Types: Cigarettes  . Smokeless tobacco: Never Used  . Tobacco comment: quit smoking about 50 years ago  Substance and Sexual Activity  . Alcohol use: No  . Drug use: No  . Sexual activity: Not Currently  Lifestyle  . Physical activity:    Days per week: Not on file    Minutes per session: Not on file  . Stress: Not on file  Relationships  . Social connections:    Talks on phone: Not on file    Gets together: Not on file    Attends religious service: Not on file    Active member of club or organization: Not on file    Attends meetings of clubs or organizations: Not on file    Relationship status: Not on file  . Intimate partner violence:    Fear of current or ex partner: Not on file    Emotionally abused: Not on file    Physically abused: Not on file    Forced sexual activity: Not on file  Other Topics Concern  . Not on file  Social History Narrative   He is married with one daughter. He has 2 grandchildren.   He does not smoke. He quit smoking in roughly 1970 after smoking 3 packs per day.   He is routinely at give him. It does not necessarily do routine exercise.     Family History:    Family History    Problem Relation Age of Onset  . Diabetes Mother   . Diabetes Sister   . Diabetes Brother    pt with long hx of CAD  ROS:  Please see the history of present illness.  General:no colds or fevers, no weight changes Skin:no rashes or ulcers HEENT:no blurred vision, no congestion CV:see HPI PUL:see HPI GI:no diarrhea constipation or melena, no indigestion GU:no hematuria, no dysuria MS:no joint pain, no claudication Neuro:no syncope, no lightheadedness Endo:+ diabetes, no thyroid disease  All other ROS reviewed and negative.     Physical Exam/Data:   Vitals:   09/16/17 1700 09/16/17 2057 09/17/17 0535  BP: (!) 184/55 (!) 152/47 (!) 156/53  Pulse: 61 60 63  Resp:   11  Temp: 97.8 F (36.6 C) 97.7 F (36.5 C) 98.1 F (36.7 C)  TempSrc: Oral Oral Oral  SpO2: 97% 97% 97%  Weight: 63.3 kg  63.4 kg  Height: 5\' 7"  (9.381 m)  Intake/Output Summary (Last 24 hours) at 09/17/2017 1013 Last data filed at 09/17/2017 0400 Gross per 24 hour  Intake 556.14 ml  Output -  Net 556.14 ml   Filed Weights   09/16/17 1700 09/17/17 0535  Weight: 63.3 kg 63.4 kg   Body mass index is 21.88 kg/m.  General:  Well nourished, well developed, in significant pain with chest and back HEENT: normal Lymph: no adenopathy Neck: no JVD Endocrine:  No thryomegaly Vascular: No carotid bruits; pedal pulses 2+ bilaterally   Cardiac:  normal S1, S2; RRR; no murmur gallup rub or click Lungs:  clear to auscultation bilaterally, no wheezing, rhonchi or rales  Abd: soft, nontender, no hepatomegaly  Ext: no edema Musculoskeletal:  No deformities, BUE and BLE strength normal and equal no pain to palpation to back  Skin: warm and dry  Neuro:  Alert and oriented X 3 MAE follows commands, no focal abnormalities noted Psych:  Normal affect    Relevant CV Studies: Cardiac cath 08/04/14 1. Ost RCA to Mid RCA lesion, 70% stenosed. Mid RCA to Dist RCA lesion, 70% stenosed. 2. Ost LAD to Prox LAD stented  segment 100% stenosed. Mid LAD to Dist LAD stented segment 95% stenosed (visualized via retrograde filling from Radial Graft. 3. Widely patent SVG-distal RCA with Retrograde flow almost all the way up to the proximal 70% stenosis of the native RCA . Antegrade flow fills the entire posterolateral system with minimal disease. 4. Ost Cx to Prox Cx lesion, 80% stenosed. Widely patent SVG-CxOM. Retrograde flow from the native circumflex in the graft fills the entire graft back to the ostium. 5. Roughly 50% ostial stenosis of the free radial artery graft to the LAD. 6. Normal LV function and EDP   Likely culprit lesion for the patient's chest pain and abnormal stress test is the poor retrograde filling of the mid LAD 95% in-stent restenosis. Or truly this cannot be approached from a percutaneous option.  Recommendation is to continue medical management.  Plan:  Sheath removal and PACU holding area for manual pressure hemostasis.  Likely discharge later on today by primary team.  He cannot take beta blockers due to bradycardia, so for into anginal medications we will add Ranexa and amlodipine. Reduce dose of ACE inhibitor to allow blood pressure room for amlodipine. Would not restart HCTZ.   Diagnostic Diagram        Laboratory Data:  ChemistryNo results for input(s): NA, K, CL, CO2, GLUCOSE, BUN, CREATININE, CALCIUM, GFRNONAA, GFRAA, ANIONGAP in the last 168 hours.  No results for input(s): PROT, ALBUMIN, AST, ALT, ALKPHOS, BILITOT in the last 168 hours. Hematology Recent Labs  Lab 09/17/17 0101  WBC 6.9  RBC 4.21*  HGB 12.9*  HCT 37.4*  MCV 88.8  MCH 30.6  MCHC 34.5  RDW 12.0  PLT 178   Cardiac EnzymesNo results for input(s): TROPONINI in the last 168 hours. No results for input(s): TROPIPOC in the last 168 hours.  BNPNo results for input(s): BNP, PROBNP in the last 168 hours.  DDimer No results for input(s): DDIMER in the last 168 hours.  Radiology/Studies:  No results  found.  Assessment and Plan:   1. [place]/ ACS pt with increased chest pain and troponin T neg. Now on Heparin and IV NTG with continued pain.  Will increase NTG and he is NPO , not sure stress test will be beneficial - will need cardiac cath - either today or tomorrow Dr. Domenic Polite to see.  He is on asa and  plavix.   DOES have allergy to contrast  2. Significant CAD with CABG 1995 and Re-do 2001 to LAD with free radial last cath 50 % prox stenosis in this vessel and other grafts patent.  Pt could not afford ranexa.  BB with significant bradycardia.  On imdur at home.  3. HLD on lipitor 40, will increase to 80 for now. On lovaza  4. HTN  Mostly controlled on cardura, imdura 30 will hold while on IV NTG,  5. DM on insulin per IM.   For questions or updates, please contact [place] Please consult [email or web address] for contact info under   Signed, Cecilie Kicks, NP  09/17/2017 10:13 AM   Attending note:  Patient seen and examined.  I reviewed extensive records and discussed the case with Ms. Ingold NP.  Mr. Urbanek is a patient of Dr. Ellyn Hack with a history of CAD status post CABG in 1995 with redo operation in 2001.  He has prior history of multiple PCI's as well. Last cardiac catheterization in 2016 revealed patent vein grafts with 50% ostial stenosis of the free radial to the LAD.  He was managed medically at that time.  He presents now with suddenly worsening angina symptoms since Wednesday.  States that he has been taking his medications regularly.  He was initially evaluated at [facility], troponin I levels negative, and ECG nonacute, but continued to have intermittent angina symptoms.  He was transferred for further evaluation at [facility].  Patient complaining of chest discomfort this morning, also musculoskeletal back pain.  He has been placed on IV nitroglycerin as well as IV heparin, morphine being given as well.  His symptoms were improving on my assessment.  Heart rate  is in the 50s to 60s in sinus rhythm by telemetry which I personally reviewed.  Systolic blood pressure ranging 130s to 150s.  Lungs are clear without labored breathing.  Cardiac exam reveals RRR without gallop or rub.  Lab work shows hemoglobin 12.9, platelets 178, follow-up troponin I less than 0.03.  His ECG shows sinus rhythm with nonspecific ST-T changes, personally reviewed.  Patient presents with unstable angina, ongoing symptoms this morning on evaluation, but improving on combination of IV nitroglycerin, IV heparin, and morphine.  He is otherwise without abnormal cardiac markers despite fairly prolonged symptoms, and ECG is nonspecific.  Continue to titrate therapies.  Continue aspirin, Lipitor, Plavix, and Lovaza.  He will need a diagnostic cardiac catheterization.  I discussed the case with Dr. Irish Lack on-call for the interventional team today in case symptoms do not settle down and he needs to go more urgently.  Otherwise will anticipate procedure for tomorrow.  Satira Sark, M.D., F.A.C.C.

## 2017-09-17 NOTE — Progress Notes (Signed)
PROGRESS NOTE    [name]  OVZ:[id number] DOB: 1932/09/03 DOA: 09/16/2017 PCP: Rory Percy, MD    Brief Narrative:  84 y.o. male with medical history significant of coronary artery disease status post coronary artery bypass grafting first in 1995 with a redo in 2001 and in 2016 with multiple subsequent cath and PCI with the last in 2016 at which point patient has been placed on medical management but all grafts were patent.  Patient being followed by Dr. Ellyn Hack.  Also has diabetes with hypertension.  Patient went to [facility] emergency room yesterday with persistent chest pain.  Pain was rated as 7-8 out of 10 in the left side of his chest and going all the way to his jaw.  The pain is consistent with his typical cardiac pain.  He was treated with nitroglycerin and morphine.  EKG was unchanged.  Serial enzymes were negative but patient continues to have chest pain.  Suspected unstable angina.  ER physician discussed with cardiologist over the phone and recommended transferring patient to [facility] where his cardiologist is.  He is still having chest pain on arrival rated as 5 out of 10.  He responded slightly to nitroglycerin.  Patient is being admitted to be evaluated by cardiology for unstable angina.  Assessment & Plan:   Principal Problem:   Angina at rest [facility]) Active Problems:   Essential hypertension   DM2 (diabetes mellitus, type 2) - with CAD   CAD -> CABG x3 then Re-DO CABG x1 (LRad-dLAD) after atretic LIMA & LAD stent occlusion.   GERD (gastroesophageal reflux disease)   Ischemic chest pain  #1 Unstable angina:  -Suspected unstable angina.   -Cardiac enzymes noted to be negative  -Cardiology consulted. Plan for heart cath tomorrow -Currently continued on Plavix  #2 essential hypertension:  -BP stable at present, albeit suboptimal -Have added PRN IV hydralazine  #3 diabetes:  -Continue with SSI coverage -Continue on Lantus 24 units as tolerated  #4  GERD:  -Appears stable at present -Will continue with PPIs.  #5 peripheral arterial disease:  -Continue on statin as tolerated   DVT prophylaxis: heparin gtt Code Status: Full Family Communication: Pt in room, family at bedside Disposition Plan: Uncertain at this time  Consultants:   Cardiology  Procedures:     Antimicrobials: Anti-infectives (From admission, onward)   None       Subjective: Complaining of mild chest discomfort  Objective: Vitals:   09/16/17 1700 09/16/17 2057 09/17/17 0535  BP: (!) 184/55 (!) 152/47 (!) 156/53  Pulse: 61 60 63  Resp:   11  Temp: 97.8 F (36.6 C) 97.7 F (36.5 C) 98.1 F (36.7 C)  TempSrc: Oral Oral Oral  SpO2: 97% 97% 97%  Weight: 63.3 kg  63.4 kg  Height: 5\' 7"  (1.702 m)      Intake/Output Summary (Last 24 hours) at 09/17/2017 1318 Last data filed at 09/17/2017 1023 Gross per 24 hour  Intake 947.18 ml  Output -  Net 947.18 ml   Filed Weights   09/16/17 1700 09/17/17 0535  Weight: 63.3 kg 63.4 kg    Examination:  General exam: Appears calm and comfortable  Respiratory system: Clear to auscultation. Respiratory effort normal. Cardiovascular system: S1 & S2 heard, RRR. Gastrointestinal system: Abdomen is nondistended, soft and nontender. No organomegaly or masses felt. Normal bowel sounds heard. Central nervous system: Alert and oriented. No focal neurological deficits. Extremities: Symmetric 5 x 5 power. Skin: No rashes, lesions Psychiatry: Judgement and insight  appear normal. Mood & affect appropriate.   Data Reviewed: I have personally reviewed following labs and imaging studies  CBC: Recent Labs  Lab 09/17/17 0101  WBC 6.9  HGB 12.9*  HCT 37.4*  MCV 88.8  PLT 161   Basic Metabolic Panel: No results for input(s): NA, K, CL, CO2, GLUCOSE, BUN, CREATININE, CALCIUM, MG, PHOS in the last 168 hours. GFR: CrCl cannot be calculated (Patient's most recent lab result is older than the maximum 21 days  allowed.). Liver Function Tests: No results for input(s): AST, ALT, ALKPHOS, BILITOT, PROT, ALBUMIN in the last 168 hours. No results for input(s): LIPASE, AMYLASE in the last 168 hours. No results for input(s): AMMONIA in the last 168 hours. Coagulation Profile: No results for input(s): INR, PROTIME in the last 168 hours. Cardiac Enzymes: Recent Labs  Lab 09/17/17 1047  TROPONINI <0.03   BNP (last 3 results) No results for input(s): PROBNP in the last 8760 hours. HbA1C: No results for input(s): HGBA1C in the last 72 hours. CBG: Recent Labs  Lab 09/16/17 1801 09/16/17 2050 09/16/17 2205 09/17/17 0744 09/17/17 1134  GLUCAP 161* 69* 148* 132* 129*   Lipid Profile: No results for input(s): CHOL, HDL, LDLCALC, TRIG, CHOLHDL, LDLDIRECT in the last 72 hours. Thyroid Function Tests: No results for input(s): TSH, T4TOTAL, FREET4, T3FREE, THYROIDAB in the last 72 hours. Anemia Panel: No results for input(s): VITAMINB12, FOLATE, FERRITIN, TIBC, IRON, RETICCTPCT in the last 72 hours. Sepsis Labs: No results for input(s): PROCALCITON, LATICACIDVEN in the last 168 hours.  No results found for this or any previous visit (from the past 240 hour(s)).   Radiology Studies: No results found.  Scheduled Meds: . aspirin EC  81 mg Oral Daily  . atorvastatin  80 mg Oral q1800  . brinzolamide  1 drop Both Eyes TID   And  . brimonidine  1 drop Both Eyes TID  . cholecalciferol  1,000 Units Oral Daily  . clopidogrel  75 mg Oral Daily  . cyclobenzaprine  5 mg Oral TID  . doxazosin  2 mg Oral QHS  . fluticasone  1 spray Each Nare Daily  . gabapentin  300 mg Oral TID  . insulin aspart  0-5 Units Subcutaneous QHS  . insulin aspart  0-9 Units Subcutaneous TID WC  . insulin glargine  24 Units Subcutaneous QHS  . isosorbide mononitrate  30 mg Oral Daily  . latanoprost  1 drop Both Eyes QHS  . omega-3 acid ethyl esters  1 g Oral Daily  . pantoprazole  40 mg Oral Daily  . prednisoLONE acetate   1 drop Left Eye BID  . saccharomyces boulardii  250 mg Oral BID  . timolol  1 drop Left Eye Daily  . vitamin C  1,000 mg Oral Daily   Continuous Infusions: . sodium chloride 50 mL/hr at 09/17/17 0400  . heparin 800 Units/hr (09/17/17 0315)  . nitroGLYCERIN 35 mcg/min (09/17/17 1140)     LOS: 1 day   Marylu Lund, MD [facility] Pager On Amion  If 7PM-7AM, please contact night-coverage 09/17/2017, 1:18 PM

## 2017-09-17 NOTE — Progress Notes (Signed)
ANTICOAGULATION CONSULT NOTE - follow up Pharmacy Consult for heparin Indication: chest pain/ACS  Allergies  Allergen Reactions  . Iohexol Other (See Comments)    Intractable shaking.  . Contrast Media [Iodinated Diagnostic Agents]     Shivering & shaking. Pt reports takes antihistamine prior to procedures.  . Metformin And Related Diarrhea    Subsequently discontinued    Patient Measurements: Height: 5\' 7"  (170.2 cm) Weight: 139 lb 9.5 oz (63.3 kg) IBW/kg (Calculated) : 66.1  Heparin dosing weight: 63.3 kg  Vital Signs: Temp: 97.7 F (36.5 C) (09/14 2057) Temp Source: Oral (09/14 2057) BP: 152/47 (09/14 2057) Pulse Rate: 60 (09/14 2057)  Labs: Recent Labs    09/17/17 0101  HGB 12.9*  HCT 37.4*  PLT 178  HEPARINUNFRC 0.77*    CrCl cannot be calculated (Patient's most recent lab result is older than the maximum 21 days allowed.).  Assessment: 84 yo m transferred from [facility]. Currently on heparin 900 units/hr for chest pain/ACS.   Heparin level = 0.77, slightly above therapeutic goal.  No bleeding reported and confirmed with RN.   H/H 12.9/37.4, pltc 178k  Goal of Therapy:  Heparin level 0.3-0.7 units/ml Monitor platelets by anticoagulation protocol: Yes   Plan:  Decrease heparin to 800 units/hr 8 hour heparin level Daily heparin level, CBC.  Nicole Cella, RPh Clinical Pharmacist Please check AMION for all [facility] Pharmacy numbers 09/17/2017 3:05 AM

## 2017-09-18 ENCOUNTER — Encounter (HOSPITAL_COMMUNITY): Payer: Self-pay | Admitting: General Practice

## 2017-09-18 ENCOUNTER — Encounter (HOSPITAL_COMMUNITY): Admission: AD | Disposition: A | Discharge status: Home / Self Care | Payer: Self-pay | Attending: Internal Medicine

## 2017-09-18 ENCOUNTER — Other Ambulatory Visit: Payer: Self-pay

## 2017-09-18 DIAGNOSIS — I208 Other forms of angina pectoris: Secondary | ICD-10-CM

## 2017-09-18 HISTORY — PX: LEFT HEART CATH AND CORONARY ANGIOGRAPHY: CATH118249

## 2017-09-18 LAB — CBC
HCT: 36.1 % — ABNORMAL LOW (ref 39.0–52.0)
Hemoglobin: 12.1 g/dL — ABNORMAL LOW (ref 13.0–17.0)
MCH: 30.6 pg (ref 26.0–34.0)
MCHC: 33.5 g/dL (ref 30.0–36.0)
MCV: 91.2 fL (ref 78.0–100.0)
PLATELETS: 145 10*3/uL — AB (ref 150–400)
RBC: 3.96 MIL/uL — ABNORMAL LOW (ref 4.22–5.81)
RDW: 12.3 % (ref 11.5–15.5)
WBC: 6.3 10*3/uL (ref 4.0–10.5)

## 2017-09-18 LAB — GLUCOSE, CAPILLARY
Glucose-Capillary: 151 mg/dL — ABNORMAL HIGH (ref 70–99)
Glucose-Capillary: 182 mg/dL — ABNORMAL HIGH (ref 70–99)
Glucose-Capillary: 320 mg/dL — ABNORMAL HIGH (ref 70–99)
Glucose-Capillary: 252 mg/dL — ABNORMAL HIGH (ref 70–99)

## 2017-09-18 LAB — BASIC METABOLIC PANEL
Anion gap: 11 (ref 5–15)
BUN: 14 mg/dL (ref 8–23)
CHLORIDE: 107 mmol/L (ref 98–111)
CO2: 21 mmol/L — ABNORMAL LOW (ref 22–32)
CREATININE: 1.06 mg/dL (ref 0.61–1.24)
Calcium: 8.8 mg/dL — ABNORMAL LOW (ref 8.9–10.3)
GFR calc Af Amer: >60 mL/min (ref >60)
GFR calc non Af Amer: >60 mL/min (ref >60)
GLUCOSE: 158 mg/dL — AB (ref 70–99)
Potassium: 3.8 mmol/L (ref 3.5–5.1)
Sodium: 139 mmol/L (ref 135–145)

## 2017-09-18 LAB — LIPID PANEL
Cholesterol: 131 mg/dL (ref 0–200)
HDL: 29 mg/dL — AB (ref >40)
LDL Cholesterol: 80 mg/dL (ref 0–99)
TRIGLYCERIDES: 110 mg/dL (ref <150)
Total CHOL/HDL Ratio: 4.5 RATIO
VLDL: 22 mg/dL (ref 0–40)

## 2017-09-18 LAB — HEPARIN LEVEL (UNFRACTIONATED): Heparin Unfractionated: 0.5 IU/mL (ref 0.30–0.70)

## 2017-09-18 SURGERY — LEFT HEART CATH AND CORONARY ANGIOGRAPHY
Anesthesia: LOCAL

## 2017-09-18 MED ORDER — LIDOCAINE HCL (PF) 1 % IJ SOLN
INTRAMUSCULAR | Status: DC | PRN
  Administered 2017-09-18: 2 mL

## 2017-09-18 MED ORDER — SODIUM CHLORIDE 0.9% FLUSH
3.0000 mL | Freq: Two times a day (BID) | INTRAVENOUS | Status: DC
  Administered 2017-09-18 – 2017-09-19 (×2): 3 mL via INTRAVENOUS

## 2017-09-18 MED ORDER — DIPHENHYDRAMINE HCL 25 MG PO CAPS
50.0000 mg | ORAL_CAPSULE | Freq: Once | ORAL | Status: AC
  Administered 2017-09-18: 50 mg via ORAL
  Filled 2017-09-18: qty 2

## 2017-09-18 MED ORDER — HEPARIN (PORCINE) IN NACL 1000-0.9 UT/500ML-% IV SOLN
INTRAVENOUS | Status: DC | PRN
  Administered 2017-09-18 (×2): 500 mL

## 2017-09-18 MED ORDER — SODIUM CHLORIDE 0.9% FLUSH: 3.0000 mL | INTRAVENOUS | Status: DC | PRN

## 2017-09-18 MED ORDER — METHYLPREDNISOLONE SODIUM SUCC 40 MG IJ SOLR
40.0000 mg | INTRAMUSCULAR | Status: DC
  Administered 2017-09-18 (×2): 40 mg via INTRAVENOUS
  Filled 2017-09-18 (×2): qty 1

## 2017-09-18 MED ORDER — SODIUM CHLORIDE 0.9 % IV SOLN: 250.0000 mL | INTRAVENOUS | Status: DC | PRN

## 2017-09-18 MED ORDER — MIDAZOLAM HCL 2 MG/2ML IJ SOLN
INTRAMUSCULAR | Status: DC | PRN
  Administered 2017-09-18: 1 mg via INTRAVENOUS

## 2017-09-18 MED ORDER — HEPARIN (PORCINE) IN NACL 1000-0.9 UT/500ML-% IV SOLN
INTRAVENOUS | Status: AC
  Filled 2017-09-18: qty 1000

## 2017-09-18 MED ORDER — HEPARIN SODIUM (PORCINE) 1000 UNIT/ML IJ SOLN
INTRAMUSCULAR | Status: DC | PRN
  Administered 2017-09-18: 3000 [IU] via INTRAVENOUS

## 2017-09-18 MED ORDER — SODIUM CHLORIDE 0.9 % WEIGHT BASED INFUSION: 1.0000 mL/kg/h | INTRAVENOUS | Status: AC

## 2017-09-18 MED ORDER — LIDOCAINE HCL (PF) 1 % IJ SOLN
INTRAMUSCULAR | Status: AC
  Filled 2017-09-18: qty 30

## 2017-09-18 MED ORDER — ASPIRIN 81 MG PO CHEW
81.0000 mg | CHEWABLE_TABLET | ORAL | Status: AC
  Administered 2017-09-18: 81 mg via ORAL
  Filled 2017-09-18: qty 1

## 2017-09-18 MED ORDER — FENTANYL CITRATE (PF) 100 MCG/2ML IJ SOLN
INTRAMUSCULAR | Status: DC | PRN
  Administered 2017-09-18: 25 ug via INTRAVENOUS

## 2017-09-18 MED ORDER — ENOXAPARIN SODIUM 40 MG/0.4ML ~~LOC~~ SOLN
40.0000 mg | SUBCUTANEOUS | Status: DC
  Filled 2017-09-18: qty 0.4

## 2017-09-18 MED ORDER — SODIUM CHLORIDE 0.9 % IV SOLN: INTRAVENOUS | Status: DC

## 2017-09-18 MED ORDER — FENTANYL CITRATE (PF) 100 MCG/2ML IJ SOLN
INTRAMUSCULAR | Status: AC
  Filled 2017-09-18: qty 2

## 2017-09-18 MED ORDER — IOHEXOL 350 MG/ML SOLN
INTRAVENOUS | Status: DC | PRN
  Administered 2017-09-18: 90 mL via INTRAVENOUS

## 2017-09-18 MED ORDER — DIPHENHYDRAMINE HCL 50 MG/ML IJ SOLN: 50.0000 mg | Freq: Once | INTRAMUSCULAR | Status: AC

## 2017-09-18 MED ORDER — VERAPAMIL HCL 2.5 MG/ML IV SOLN
INTRAVENOUS | Status: AC
  Filled 2017-09-18: qty 2

## 2017-09-18 MED ORDER — MIDAZOLAM HCL 2 MG/2ML IJ SOLN
INTRAMUSCULAR | Status: AC
  Filled 2017-09-18: qty 2

## 2017-09-18 MED ORDER — HYDRALAZINE HCL 20 MG/ML IJ SOLN
5.0000 mg | Freq: Once | INTRAMUSCULAR | Status: AC
  Administered 2017-09-18: 5 mg via INTRAVENOUS

## 2017-09-18 MED ORDER — SODIUM CHLORIDE 0.9% FLUSH
3.0000 mL | Freq: Two times a day (BID) | INTRAVENOUS | Status: DC
  Administered 2017-09-18: 3 mL via INTRAVENOUS

## 2017-09-18 MED ORDER — VERAPAMIL HCL 2.5 MG/ML IV SOLN
INTRAVENOUS | Status: DC | PRN
  Administered 2017-09-18: 10 mL via INTRA_ARTERIAL

## 2017-09-18 SURGICAL SUPPLY — 11 items
CATH 5FR JL3.5 JR4 ANG PIG MP (CATHETERS) ×2 IMPLANT
CATH INFINITI 5 FR LCB (CATHETERS) ×2 IMPLANT
DEVICE RAD COMP TR BAND LRG (VASCULAR PRODUCTS) ×2 IMPLANT
GLIDESHEATH SLEND SS 6F .021 (SHEATH) ×2 IMPLANT
GUIDEWIRE INQWIRE 1.5J.035X260 (WIRE) ×1 IMPLANT
INQWIRE 1.5J .035X260CM (WIRE) ×2
KIT HEART LEFT (KITS) ×2 IMPLANT
PACK CARDIAC CATHETERIZATION (CUSTOM PROCEDURE TRAY) ×2 IMPLANT
SYR MEDRAD MARK V 150ML (SYRINGE) ×2 IMPLANT
TRANSDUCER W/STOPCOCK (MISCELLANEOUS) ×2 IMPLANT
TUBING CIL FLEX 10 FLL-RA (TUBING) ×2 IMPLANT

## 2017-09-18 NOTE — Progress Notes (Signed)
[place] for heparin Indication: chest pain/ACS  Allergies  Allergen Reactions  . Iohexol Other (See Comments)    Intractable shaking.  . Contrast Media [Iodinated Diagnostic Agents]     Shivering & shaking. Pt reports takes antihistamine prior to procedures.  . Metformin And Related Diarrhea    Subsequently discontinued    Patient Measurements: Height: 5\' 7"  (170.2 cm) Weight: 139 lb 3.2 oz (63.1 kg) IBW/kg (Calculated) : 66.1  Heparin dosing weight: 63.3 kg  Vital Signs: Temp: 98.2 F (36.8 C) (09/16 0528) Temp Source: Oral (09/16 0528)  Labs: Recent Labs    09/17/17 0101 09/17/17 0704 09/17/17 1047 09/17/17 1313 09/17/17 1623 09/17/17 2205 09/18/17 0543  HGB 12.9*  --   --   --   --   --  12.1*  HCT 37.4*  --   --   --   --   --  36.1*  PLT 178  --   --   --   --   --  145*  HEPARINUNFRC 0.77* 0.78*  --  0.67  --   --  0.50  CREATININE  --   --   --   --   --   --  1.06  TROPONINI  --   --  <0.03  --  <0.03 <0.03  --     Estimated Creatinine Clearance: 47.1 mL/min (by C-G formula based on SCr of 1.06 mg/dL).  Assessment: 84 yo m transferred from [facility] on heparin for chest pain/ACS.  9/16: Heparin level is therapeutic today at 0.50, on 750 units/hr. Hgb 12.1, plt 145. No infusion issues and no s/sx of bleeding. Not on anticoag PTA.  Plan is for cardiac cath today, 9/16.  Goal of Therapy:  Heparin level 0.3-0.7 units/ml Monitor platelets by anticoagulation protocol: Yes   Plan:  Continue heparin infusion at 750 units/hr Daily heparin level, CBC, monitor for s/sx of bleeding  Thank you for involving pharmacy in this patient's care.  Janae Bridgeman, PharmD PGY1 Pharmacy Resident Phone: [phone] 09/18/2017 8:47 AM

## 2017-09-18 NOTE — Interval H&P Note (Signed)
History and Physical Interval Note:  09/18/2017 10:44 AM  [name]  has presented today for surgery, with the diagnosis of unstable angina  The various methods of treatment have been discussed with the patient and family. After consideration of risks, benefits and other options for treatment, the patient has consented to  Procedure(s): LEFT HEART CATH AND CORONARY ANGIOGRAPHY (N/A) as a surgical intervention .  The patient's history has been reviewed, patient examined, no change in status, stable for surgery.  I have reviewed the patient's chart and labs.  Questions were answered to the patient's satisfaction.    Cath Lab Visit (complete for each Cath Lab visit)  Clinical Evaluation Leading to the Procedure:   ACS: Yes.    Non-ACS:    Anginal Classification: CCS IV  Anti-ischemic medical therapy: Maximal Therapy (2 or more classes of medications)  Non-Invasive Test Results: No non-invasive testing performed  Prior CABG: Previous CABG       Collier Salina [facility] 09/18/2017 10:44 AM

## 2017-09-18 NOTE — H&P (View-Only) (Signed)
Progress Note  Patient Name: [name] Date of Encounter: 09/18/2017  Primary Cardiologist: Glenetta Hew, MD  Subjective   Still with intermittent episodes of chest pain while on nitro drip. Currently pain free.   Inpatient Medications    Scheduled Meds: . aspirin EC  81 mg Oral Daily  . atorvastatin  80 mg Oral q1800  . brinzolamide  1 drop Both Eyes TID   And  . brimonidine  1 drop Both Eyes TID  . cholecalciferol  1,000 Units Oral Daily  . clopidogrel  75 mg Oral Daily  . cyclobenzaprine  5 mg Oral TID  . diphenhydrAMINE  50 mg Oral Once   Or  . diphenhydrAMINE  50 mg Intravenous Once  . doxazosin  2 mg Oral QHS  . fluticasone  1 spray Each Nare Daily  . gabapentin  300 mg Oral TID  . insulin aspart  0-5 Units Subcutaneous QHS  . insulin aspart  0-9 Units Subcutaneous TID WC  . insulin glargine  24 Units Subcutaneous QHS  . isosorbide mononitrate  30 mg Oral Daily  . latanoprost  1 drop Right Eye QHS  . methylPREDNISolone (SOLU-MEDROL) injection  40 mg Intravenous Q4H  . omega-3 acid ethyl esters  1 g Oral Daily  . pantoprazole  40 mg Oral Daily  . prednisoLONE acetate  1 drop Left Eye BID  . saccharomyces boulardii  250 mg Oral BID  . sodium chloride flush  3 mL Intravenous Q12H  . timolol  1 drop Both Eyes Daily  . vitamin C  1,000 mg Oral Daily   Continuous Infusions: . sodium chloride 50 mL/hr at 09/17/17 1510  . sodium chloride    . sodium chloride    . heparin 750 Units/hr (09/17/17 2358)  . nitroGLYCERIN 35 mcg/min (09/18/17 0709)   PRN Meds: sodium chloride, acetaminophen, hydrALAZINE, loratadine, morphine injection, nitroGLYCERIN, ondansetron (ZOFRAN) IV, sodium chloride flush   Vital Signs    Vitals:   09/17/17 2000 09/17/17 2054 09/18/17 0528 09/18/17 0546  BP: (!) 129/50     Pulse: 64     Resp:      Temp:   98.2 F (36.8 C)   TempSrc:   Oral   SpO2: 95% 95%    Weight:    63.1 kg  Height:        Intake/Output Summary (Last 24  hours) at 09/18/2017 0847 Last data filed at 09/17/2017 1848 Gross per 24 hour  Intake 957.13 ml  Output -  Net 957.13 ml   Filed Weights   09/16/17 1700 09/17/17 0535 09/18/17 0546  Weight: 63.3 kg 63.4 kg 63.1 kg    Telemetry    SB - Personally Reviewed  ECG    SB - Personally Reviewed  Physical Exam   General: Well developed, well nourished, male appearing in no acute distress. Head: Normocephalic, atraumatic.  Neck: Supple without bruits, JVD. Lungs:  Resp regular and unlabored, CTA. Heart: RRR, S1, S2, soft systolic murmur; no rub. Abdomen: Soft, non-tender, non-distended with normoactive bowel sounds. Extremities: No clubbing, cyanosis, edema. Distal pedal pulses are 2+ bilaterally. Neuro: Alert and oriented X 3. Moves all extremities spontaneously. Psych: Normal affect.  Labs    Chemistry Recent Labs  Lab 09/18/17 0543  NA 139  K 3.8  CL 107  CO2 21*  GLUCOSE 158*  BUN 14  CREATININE 1.06  CALCIUM 8.8*  GFRNONAA >60  GFRAA >60  ANIONGAP 11     Hematology Recent Labs  Lab  09/17/17 0101 09/18/17 0543  WBC 6.9 6.3  RBC 4.21* 3.96*  HGB 12.9* 12.1*  HCT 37.4* 36.1*  MCV 88.8 91.2  MCH 30.6 30.6  MCHC 34.5 33.5  RDW 12.0 12.3  PLT 178 145*    Cardiac Enzymes Recent Labs  Lab 09/17/17 1047 09/17/17 1623 09/17/17 2205  TROPONINI <0.03 <0.03 <0.03   No results for input(s): TROPIPOC in the last 168 hours.   BNPNo results for input(s): BNP, PROBNP in the last 168 hours.   DDimer No results for input(s): DDIMER in the last 168 hours.    Radiology    No results found.  Cardiac Studies   N/a   Patient Profile     84 y.o. male with a hx of CAD and CABG 1995 and redo 2001, chronic stable angina, last cath 2016 no PCI option medical therapy recommended, could not afford Imdur, DM-2, HLD, PAD with stent to Lt subclavian artery who presented with chest pain from [place]    1. [place]/ ACS: pt with increased  chest pain and troponin T neg. Remains on Heparin and IV NTG with continued pain at times.  Given CAD hx planned for cardiac cath today. Does have a contrast allergy but being medicated for this.  -- continue IV heparin, ASA, statin and plavix. Baseline bradycardia preventing adding BB.   2. Significant CAD with CABG 1995 and Re-do 2001 to LAD with free radial last cath 50 % prox stenosis in this vessel and other grafts patent:  Pt could not afford ranexa.  No BB with significant bradycardia.  On imdur at home.   3. HLD: previously on Zocor with medications received through the [place]. Reports no intolerance to statins. Switched to Lipitor 80mg  on admission. On lovaza  -- check lipids   4. HTN:  Mostly controlled on cardura, and  Imdur 30. Hold Imdur  while on IV NTG,   5. DM: on insulin per IM.   Signed, Reino Bellis, NP  09/18/2017, 8:47 AM  Pager # [phone]   For questions or updates, please contact [place] Please consult [email or web address] for contact info under Cardiology/STEMI.  I have personally seen and examined this patient. I agree with the assessment and plan as outlined above.  Agree with the above plan. Pt with ongoing chest pain. Troponin negative. Cardiac cath indicated to see if there are any options for revascularization.  He is being taken to the cath lab now.   Lauree Chandler 09/18/2017 10:32 AM

## 2017-09-18 NOTE — Progress Notes (Addendum)
Progress Note  Patient Name: [name] Date of Encounter: 09/18/2017  Primary Cardiologist: Glenetta Hew, MD  Subjective   Still with intermittent episodes of chest pain while on nitro drip. Currently pain free.   Inpatient Medications    Scheduled Meds: . aspirin EC  81 mg Oral Daily  . atorvastatin  80 mg Oral q1800  . brinzolamide  1 drop Both Eyes TID   And  . brimonidine  1 drop Both Eyes TID  . cholecalciferol  1,000 Units Oral Daily  . clopidogrel  75 mg Oral Daily  . cyclobenzaprine  5 mg Oral TID  . diphenhydrAMINE  50 mg Oral Once   Or  . diphenhydrAMINE  50 mg Intravenous Once  . doxazosin  2 mg Oral QHS  . fluticasone  1 spray Each Nare Daily  . gabapentin  300 mg Oral TID  . insulin aspart  0-5 Units Subcutaneous QHS  . insulin aspart  0-9 Units Subcutaneous TID WC  . insulin glargine  24 Units Subcutaneous QHS  . isosorbide mononitrate  30 mg Oral Daily  . latanoprost  1 drop Right Eye QHS  . methylPREDNISolone (SOLU-MEDROL) injection  40 mg Intravenous Q4H  . omega-3 acid ethyl esters  1 g Oral Daily  . pantoprazole  40 mg Oral Daily  . prednisoLONE acetate  1 drop Left Eye BID  . saccharomyces boulardii  250 mg Oral BID  . sodium chloride flush  3 mL Intravenous Q12H  . timolol  1 drop Both Eyes Daily  . vitamin C  1,000 mg Oral Daily   Continuous Infusions: . sodium chloride 50 mL/hr at 09/17/17 1510  . sodium chloride    . sodium chloride    . heparin 750 Units/hr (09/17/17 2358)  . nitroGLYCERIN 35 mcg/min (09/18/17 0709)   PRN Meds: sodium chloride, acetaminophen, hydrALAZINE, loratadine, morphine injection, nitroGLYCERIN, ondansetron (ZOFRAN) IV, sodium chloride flush   Vital Signs    Vitals:   09/17/17 2000 09/17/17 2054 09/18/17 0528 09/18/17 0546  BP: (!) 129/50     Pulse: 64     Resp:      Temp:   98.2 F (36.8 C)   TempSrc:   Oral   SpO2: 95% 95%    Weight:    63.1 kg  Height:        Intake/Output Summary (Last 24  hours) at 09/18/2017 0847 Last data filed at 09/17/2017 1848 Gross per 24 hour  Intake 957.13 ml  Output -  Net 957.13 ml   Filed Weights   09/16/17 1700 09/17/17 0535 09/18/17 0546  Weight: 63.3 kg 63.4 kg 63.1 kg    Telemetry    SB - Personally Reviewed  ECG    SB - Personally Reviewed  Physical Exam   General: Well developed, well nourished, male appearing in no acute distress. Head: Normocephalic, atraumatic.  Neck: Supple without bruits, JVD. Lungs:  Resp regular and unlabored, CTA. Heart: RRR, S1, S2, soft systolic murmur; no rub. Abdomen: Soft, non-tender, non-distended with normoactive bowel sounds. Extremities: No clubbing, cyanosis, edema. Distal pedal pulses are 2+ bilaterally. Neuro: Alert and oriented X 3. Moves all extremities spontaneously. Psych: Normal affect.  Labs    Chemistry Recent Labs  Lab 09/18/17 0543  NA 139  K 3.8  CL 107  CO2 21*  GLUCOSE 158*  BUN 14  CREATININE 1.06  CALCIUM 8.8*  GFRNONAA >60  GFRAA >60  ANIONGAP 11     Hematology Recent Labs  Lab  09/17/17 0101 09/18/17 0543  WBC 6.9 6.3  RBC 4.21* 3.96*  HGB 12.9* 12.1*  HCT 37.4* 36.1*  MCV 88.8 91.2  MCH 30.6 30.6  MCHC 34.5 33.5  RDW 12.0 12.3  PLT 178 145*    Cardiac Enzymes Recent Labs  Lab 09/17/17 1047 09/17/17 1623 09/17/17 2205  TROPONINI <0.03 <0.03 <0.03   No results for input(s): TROPIPOC in the last 168 hours.   BNPNo results for input(s): BNP, PROBNP in the last 168 hours.   DDimer No results for input(s): DDIMER in the last 168 hours.    Radiology    No results found.  Cardiac Studies   N/a   Patient Profile     84 y.o. male with a hx of CAD and CABG 1995 and redo 2001, chronic stable angina, last cath 2016 no PCI option medical therapy recommended, could not afford Imdur, DM-2, HLD, PAD with stent to Lt subclavian artery who presented with chest pain from [place]    1. [place]/ ACS: pt with increased  chest pain and troponin T neg. Remains on Heparin and IV NTG with continued pain at times.  Given CAD hx planned for cardiac cath today. Does have a contrast allergy but being medicated for this.  -- continue IV heparin, ASA, statin and plavix. Baseline bradycardia preventing adding BB.   2. Significant CAD with CABG 1995 and Re-do 2001 to LAD with free radial last cath 50 % prox stenosis in this vessel and other grafts patent:  Pt could not afford ranexa.  No BB with significant bradycardia.  On imdur at home.   3. HLD: previously on Zocor with medications received through the [place]. Reports no intolerance to statins. Switched to Lipitor 80mg  on admission. On lovaza  -- check lipids   4. HTN:  Mostly controlled on cardura, and  Imdur 30. Hold Imdur  while on IV NTG,   5. DM: on insulin per IM.   Signed, Reino Bellis, NP  09/18/2017, 8:47 AM  Pager # [phone]   For questions or updates, please contact [place] Please consult [email or web address] for contact info under Cardiology/STEMI.  I have personally seen and examined this patient. I agree with the assessment and plan as outlined above.  Agree with the above plan. Pt with ongoing chest pain. Troponin negative. Cardiac cath indicated to see if there are any options for revascularization.  He is being taken to the cath lab now.   Lauree Chandler 09/18/2017 10:32 AM

## 2017-09-18 NOTE — Progress Notes (Signed)
PROGRESS NOTE    [name]  WYO:[id number] DOB: 07-31-1932 DOA: 09/16/2017 PCP: Rory Percy, MD    Brief Narrative:  85 y.o. male with medical history significant of coronary artery disease status post coronary artery bypass grafting first in 1995 with a redo in 2001 and in 2016 with multiple subsequent cath and PCI with the last in 2016 at which point patient has been placed on medical management but all grafts were patent.  Patient being followed by Dr. Ellyn Hack.  Also has diabetes with hypertension.  Patient went to [facility] emergency room yesterday with persistent chest pain.  Pain was rated as 7-8 out of 10 in the left side of his chest and going all the way to his jaw.  The pain is consistent with his typical cardiac pain.  He was treated with nitroglycerin and morphine.  EKG was unchanged.  Serial enzymes were negative but patient continues to have chest pain.  Suspected unstable angina.  ER physician discussed with cardiologist over the phone and recommended transferring patient to [facility] where his cardiologist is.  He is still having chest pain on arrival rated as 5 out of 10.  He responded slightly to nitroglycerin.  Patient is being admitted to be evaluated by cardiology for unstable angina.  Assessment & Plan:   Principal Problem:   Angina at rest [facility]) Active Problems:   Essential hypertension   DM2 (diabetes mellitus, type 2) - with CAD   CAD -> CABG x3 then Re-DO CABG x1 (LRad-dLAD) after atretic LIMA & LAD stent occlusion.   GERD (gastroesophageal reflux disease)   Ischemic chest pain  #1 Unstable angina:  -Cardiac enzymes noted to be negative  -Cardiology following. -Patient now s/p heart cath 9/16 with findings of severe 3 vessel disease with patent grafts - Cardiology recommendations for maximizing medical management -Currently continued on Plavix  #2 essential hypertension:  -BP stable at present, controlled -Continuing hydralazine PRN  #3  diabetes:  -Continue with SSI coverage -Continue on Lantus 24 units as tolerated -Glucose trends reviewed, stable  #4 GERD:  -Appears stable at present -Will continue with PPI as tolerated  #5 peripheral arterial disease:  -Continue on statin as patient tolerates   DVT prophylaxis: lovenox subq Code Status: Full Family Communication: Pt in room, family at bedside Disposition Plan: Uncertain at this time  Consultants:   Cardiology  Procedures:   Heart cath 9/16  Antimicrobials: Anti-infectives (From admission, onward)   None      Subjective: Without complaining  Objective: Vitals:   09/18/17 1107 09/18/17 1112 09/18/17 1117 09/18/17 1122  BP: (!) 127/55 (!) 125/52 (!) 121/51 (!) 112/52  Pulse: 63 70 63 62  Resp: 15 16 17 15   Temp:      TempSrc:      SpO2: 97% 98% 100% 99%  Weight:      Height:        Intake/Output Summary (Last 24 hours) at 09/18/2017 1621 Last data filed at 09/18/2017 1528 Gross per 24 hour  Intake 2090.1 ml  Output -  Net 2090.1 ml   Filed Weights   09/16/17 1700 09/17/17 0535 09/18/17 0546  Weight: 63.3 kg 63.4 kg 63.1 kg    Examination: General exam: Awake, laying in bed, in nad Respiratory system: Normal respiratory effort, no wheezing Cardiovascular system: regular rate, s1, s2 Gastrointestinal system: Soft, nondistended, positive BS Central nervous system: CN2-12 grossly intact, strength intact Extremities: Perfused, no clubbing Skin: Normal skin turgor, no notable skin lesions seen  Psychiatry: Mood normal // no visual hallucinations   Data Reviewed: I have personally reviewed following labs and imaging studies  CBC: Recent Labs  Lab 09/17/17 0101 09/18/17 0543  WBC 6.9 6.3  HGB 12.9* 12.1*  HCT 37.4* 36.1*  MCV 88.8 91.2  PLT 178 195*   Basic Metabolic Panel: Recent Labs  Lab 09/18/17 0543  NA 139  K 3.8  CL 107  CO2 21*  GLUCOSE 158*  BUN 14  CREATININE 1.06  CALCIUM 8.8*   GFR: Estimated  Creatinine Clearance: 47.1 mL/min (by C-G formula based on SCr of 1.06 mg/dL). Liver Function Tests: No results for input(s): AST, ALT, ALKPHOS, BILITOT, PROT, ALBUMIN in the last 168 hours. No results for input(s): LIPASE, AMYLASE in the last 168 hours. No results for input(s): AMMONIA in the last 168 hours. Coagulation Profile: No results for input(s): INR, PROTIME in the last 168 hours. Cardiac Enzymes: Recent Labs  Lab 09/17/17 1047 09/17/17 1623 09/17/17 2205  TROPONINI <0.03 <0.03 <0.03   BNP (last 3 results) No results for input(s): PROBNP in the last 8760 hours. HbA1C: No results for input(s): HGBA1C in the last 72 hours. CBG: Recent Labs  Lab 09/17/17 1134 09/17/17 1721 09/17/17 2043 09/18/17 0818 09/18/17 1219  GLUCAP 129* 102* 197* 151* 182*   Lipid Profile: Recent Labs    09/18/17 0543  CHOL 131  HDL 29*  LDLCALC 80  TRIG 110  CHOLHDL 4.5   Thyroid Function Tests: No results for input(s): TSH, T4TOTAL, FREET4, T3FREE, THYROIDAB in the last 72 hours. Anemia Panel: No results for input(s): VITAMINB12, FOLATE, FERRITIN, TIBC, IRON, RETICCTPCT in the last 72 hours. Sepsis Labs: No results for input(s): PROCALCITON, LATICACIDVEN in the last 168 hours.  No results found for this or any previous visit (from the past 240 hour(s)).   Radiology Studies: No results found.  Scheduled Meds: . aspirin EC  81 mg Oral Daily  . atorvastatin  80 mg Oral q1800  . brinzolamide  1 drop Both Eyes TID   And  . brimonidine  1 drop Both Eyes TID  . cholecalciferol  1,000 Units Oral Daily  . clopidogrel  75 mg Oral Daily  . cyclobenzaprine  5 mg Oral TID  . doxazosin  2 mg Oral QHS  . [START ON 09/19/2017] enoxaparin (LOVENOX) injection  40 mg Subcutaneous Q24H  . fluticasone  1 spray Each Nare Daily  . gabapentin  300 mg Oral TID  . insulin aspart  0-5 Units Subcutaneous QHS  . insulin aspart  0-9 Units Subcutaneous TID WC  . insulin glargine  24 Units Subcutaneous  QHS  . isosorbide mononitrate  30 mg Oral Daily  . latanoprost  1 drop Right Eye QHS  . omega-3 acid ethyl esters  1 g Oral Daily  . pantoprazole  40 mg Oral Daily  . prednisoLONE acetate  1 drop Left Eye BID  . saccharomyces boulardii  250 mg Oral BID  . sodium chloride flush  3 mL Intravenous Q12H  . timolol  1 drop Both Eyes Daily  . vitamin C  1,000 mg Oral Daily   Continuous Infusions: . sodium chloride 50 mL/hr at 09/17/17 1510  . sodium chloride    . sodium chloride 1 mL/kg/hr (09/18/17 1148)     LOS: 2 days   Marylu Lund, MD [facility] Pager On Amion  If 7PM-7AM, please contact night-coverage 09/18/2017, 4:21 PM

## 2017-09-19 DIAGNOSIS — R072 Precordial pain: Secondary | ICD-10-CM

## 2017-09-19 DIAGNOSIS — Z23 Encounter for immunization: Secondary | ICD-10-CM | POA: Diagnosis not present

## 2017-09-19 LAB — BASIC METABOLIC PANEL
Anion gap: 12 (ref 5–15)
BUN: 18 mg/dL (ref 8–23)
CALCIUM: 9 mg/dL (ref 8.9–10.3)
CO2: 21 mmol/L — AB (ref 22–32)
CREATININE: 1.13 mg/dL (ref 0.61–1.24)
Chloride: 107 mmol/L (ref 98–111)
GFR calc non Af Amer: 58 mL/min — ABNORMAL LOW (ref >60)
Glucose, Bld: 234 mg/dL — ABNORMAL HIGH (ref 70–99)
Potassium: 3.9 mmol/L (ref 3.5–5.1)
Sodium: 140 mmol/L (ref 135–145)

## 2017-09-19 LAB — CBC
HEMATOCRIT: 36.1 % — AB (ref 39.0–52.0)
Hemoglobin: 12.3 g/dL — ABNORMAL LOW (ref 13.0–17.0)
MCH: 30.7 pg (ref 26.0–34.0)
MCHC: 34.1 g/dL (ref 30.0–36.0)
MCV: 90 fL (ref 78.0–100.0)
Platelets: 171 10*3/uL (ref 150–400)
RBC: 4.01 MIL/uL — ABNORMAL LOW (ref 4.22–5.81)
RDW: 12.1 % (ref 11.5–15.5)
WBC: 13.9 10*3/uL — ABNORMAL HIGH (ref 4.0–10.5)

## 2017-09-19 LAB — GLUCOSE, CAPILLARY
GLUCOSE-CAPILLARY: 196 mg/dL — AB (ref 70–99)
Glucose-Capillary: 191 mg/dL — ABNORMAL HIGH (ref 70–99)

## 2017-09-19 MED FILL — Heparin Sodium (Porcine) 100 Unt/ML in Sodium Chloride 0.45%: INTRAMUSCULAR | Qty: 250 | Status: AC

## 2017-09-19 NOTE — Discharge Summary (Signed)
Physician Discharge Summary  [name] EQA:[id number] DOB: Feb 21, 1932 DOA: 09/16/2017  PCP: Rory Percy, MD  Admit date: 09/16/2017 Discharge date: 09/19/2017  Admitted From: Home Disposition:  Home  Recommendations for Outpatient Follow-up:  1. Follow up with PCP in 1-2 weeks 2. Follow up with Cardiology as scheduled  Discharge Condition:Stable CODE STATUS:Full Diet recommendation: Diabetic, heart healthy   Brief/Interim Summary: 84 y.o.malewith medical history significant ofcoronary artery disease status post coronary artery bypass grafting first in 1995 with a redo in 2001 and in 2016 with multiple subsequent cath and PCI with the last in 2016 at which point patient has been placed on medical management but all grafts were patent. Patient being followed by Dr. Ellyn Hack. Also has diabetes with hypertension. Patient went to [place] room yesterday with persistent chest pain. Pain was rated as 7-8 out of 10 in the left side of his chest and going all the way to his jaw. The pain is consistent with his typical cardiac pain. He was treated with nitroglycerin and morphine. EKG was unchanged. Serial enzymes were negative but patient continues to have chest pain. Suspected unstable angina. ER physician discussed with cardiologist over the phone and recommended transferring patient to [facility] where his cardiologist is. He is still having chest pain on arrival rated as 5 out of 10. He responded slightly to nitroglycerin. Patient is being admitted to be evaluated by cardiology for unstable angina.  #1Unstableangina: -Cardiac enzymes noted to be negative  -Cardiology was consulted -Patient now s/p heart cath 9/16 with findings of severe 3 vessel disease with patent grafts - Cardiology recommendations for maximizing medical management -Currently continued on Plavix -Continue outpatient follow up with Cardiology  #2 essential hypertension: -BP stable at  present, controlled -Continuing hydralazine PRN  #3 diabetes: -Continue with SSI coverage -Continued on Lantus 24 units as tolerated -Glucose trends reviewed, stable  #4 GERD: -Appears stable at present -Continued on PPI   #5 peripheral arterial disease:  -Continue on statin as patient tolerates   Discharge Diagnoses:  Principal Problem:   Angina at rest [facility]) Active Problems:   Essential hypertension   DM2 (diabetes mellitus, type 2) - with CAD   CAD -> CABG x3 then Re-DO CABG x1 (LRad-dLAD) after atretic LIMA & LAD stent occlusion.   GERD (gastroesophageal reflux disease)   Ischemic chest pain    Discharge Instructions   Allergies as of 09/19/2017      Reactions   Iohexol Other (See Comments)   Intractable shaking.   Contrast Media [iodinated Diagnostic Agents]    Shivering & shaking. Pt reports takes antihistamine prior to procedures.   Metformin And Related Diarrhea   Subsequently discontinued      Medication List    TAKE these medications   acetaminophen 500 MG tablet Commonly known as:  TYLENOL Take 1,000 mg by mouth every 6 (six) hours as needed for mild pain or headache.   acyclovir 400 MG tablet Commonly known as:  ZOVIRAX Take 400 mg by mouth 2 (two) times daily with a meal.   aspirin EC 81 MG tablet Take 81 mg by mouth daily with breakfast.   clopidogrel 75 MG tablet Commonly known as:  PLAVIX Take 1 tablet (75 mg total) by mouth daily. What changed:  when to take this   doxazosin 8 MG tablet Commonly known as:  CARDURA Take 8 mg by mouth at bedtime.   Fish Oil 1000 MG Caps Take 1,000 mg by mouth 2 (two) times daily with a  meal.   fluticasone 50 MCG/ACT nasal spray Commonly known as:  FLONASE Place 1 spray into both nostrils daily as needed (seasonal allergies).   gabapentin 300 MG capsule Commonly known as:  NEURONTIN Take 600 mg by mouth See admin instructions. Take two capsules (600 mg) by mouth three times daily - breakfast,  supper and bedtime   GLOBAL INJECT EASE LANCETS 30G Misc DIABETIC CLUB - USE TO CHECK BLOOD GLUCOSE LEVELS   glucose blood test strip DIABETIC CLUB - USE TO CHECK BLOOD GLUCOSE LEVELS   hydrocortisone cream 1 % Apply 1 application topically daily as needed for itching.   isosorbide mononitrate 60 MG 24 hr tablet Commonly known as:  IMDUR May take 1 and 1/2 tablets to 2 tablets a day depending on if chest discomfort is present   LANTUS SOLOSTAR 100 UNIT/ML Solostar Pen Generic drug:  Insulin Glargine Inject 26 Units into the skin at bedtime.   latanoprost 0.005 % ophthalmic solution Commonly known as:  XALATAN Place 1 drop into the right eye at bedtime.   loratadine 10 MG tablet Commonly known as:  CLARITIN Take 10 mg by mouth daily as needed (seasonal allergies).   NOVOLOG FLEXPEN [place] Inject 16 Units into the skin 3 (three) times daily before meals.   pantoprazole 40 MG tablet Commonly known as:  PROTONIX Take 40 mg by mouth daily with breakfast.   prednisoLONE acetate 1 % ophthalmic suspension Commonly known as:  PRED FORTE Place 1 drop into the left eye 2 (two) times daily.   SIMBRINZA 1-0.2 % Susp Generic drug:  Brinzolamide-Brimonidine Place 1 drop into both eyes 3 (three) times daily.   simvastatin 80 MG tablet Commonly known as:  ZOCOR Take 40 mg by mouth at bedtime.   timolol 0.5 % ophthalmic solution Commonly known as:  TIMOPTIC Place 1 drop into both eyes daily.   vitamin C 500 MG tablet Commonly known as:  ASCORBIC ACID Take 500 mg by mouth 2 (two) times daily with a meal.   Vitamin D 2000 units tablet Take 2,000 Units by mouth daily with breakfast.      Follow-up Information    Rory Percy, MD. Schedule an appointment as soon as possible for a visit in 2 week(s).   Specialty:  Family Medicine Contact information: [place] [address] [phone]        Leonie Man, MD .   Specialty:  Cardiology Contact  information: [address] [place] [address] [place] [address] [phone]          Allergies  Allergen Reactions  . Iohexol Other (See Comments)    Intractable shaking.  . Contrast Media [Iodinated Diagnostic Agents]     Shivering & shaking. Pt reports takes antihistamine prior to procedures.  . Metformin And Related Diarrhea    Subsequently discontinued    Consultations:  Cardiology 9/16  Procedures/Studies: No results found.  Subjective: Eager to go home  Discharge Exam: Vitals:   09/19/17 0500 09/19/17 1356  BP: (!) 158/57 (!) 106/39  Pulse: (!) 59 (!) 57  Resp: 16 18  Temp: 98.4 F (36.9 C) (!) 97.5 F (36.4 C)  SpO2:  98%   Vitals:   09/18/17 2356 09/19/17 0230 09/19/17 0500 09/19/17 1356  BP: (!) 116/49 (!) 139/54 (!) 158/57 (!) 106/39  Pulse: 68 62 (!) 59 (!) 57  Resp:   16 18  Temp:   98.4 F (36.9 C) (!) 97.5 F (36.4 C)  TempSrc:   Oral  Oral  SpO2: 96% 98%  98%  Weight:   64 kg   Height:        General: Pt is alert, awake, not in acute distress Cardiovascular: RRR, S1/S2 +, no rubs, no gallops Respiratory: CTA bilaterally, no wheezing, no rhonchi Abdominal: Soft, NT, ND, bowel sounds + Extremities: no edema, no cyanosis   The results of significant diagnostics from this hospitalization (including imaging, microbiology, ancillary and laboratory) are listed below for reference.     Microbiology: No results found for this or any previous visit (from the past 240 hour(s)).   Labs: BNP (last 3 results) No results for input(s): BNP in the last 8760 hours. Basic Metabolic Panel: Recent Labs  Lab 09/18/17 0543 09/19/17 0420  NA 139 140  K 3.8 3.9  CL 107 107  CO2 21* 21*  GLUCOSE 158* 234*  BUN 14 18  CREATININE 1.06 1.13  CALCIUM 8.8* 9.0   Liver Function Tests: No results for input(s): AST, ALT, ALKPHOS, BILITOT, PROT, ALBUMIN in the last 168 hours. No results for input(s): LIPASE, AMYLASE in the last 168 hours. No results  for input(s): AMMONIA in the last 168 hours. CBC: Recent Labs  Lab 09/17/17 0101 09/18/17 0543 09/19/17 0420  WBC 6.9 6.3 13.9*  HGB 12.9* 12.1* 12.3*  HCT 37.4* 36.1* 36.1*  MCV 88.8 91.2 90.0  PLT 178 145* 171   Cardiac Enzymes: Recent Labs  Lab 09/17/17 1047 09/17/17 1623 09/17/17 2205  TROPONINI <0.03 <0.03 <0.03   BNP: Invalid input(s): POCBNP CBG: Recent Labs  Lab 09/18/17 1219 09/18/17 1645 09/18/17 2101 09/19/17 0821 09/19/17 1203  GLUCAP 182* 320* 252* 191* 196*   D-Dimer No results for input(s): DDIMER in the last 72 hours. Hgb A1c No results for input(s): HGBA1C in the last 72 hours. Lipid Profile Recent Labs    09/18/17 0543  CHOL 131  HDL 29*  LDLCALC 80  TRIG 110  CHOLHDL 4.5   Thyroid function studies No results for input(s): TSH, T4TOTAL, T3FREE, THYROIDAB in the last 72 hours.  Invalid input(s): FREET3 Anemia work up No results for input(s): VITAMINB12, FOLATE, FERRITIN, TIBC, IRON, RETICCTPCT in the last 72 hours. Urinalysis    Component Value Date/Time   COLORURINE YELLOW 10/16/2015 [place] 10/16/2015 1606   LABSPEC 1.010 10/16/2015 1606   PHURINE 5.5 10/16/2015 1606   GLUCOSEU >1000 (A) 10/16/2015 1606   HGBUR NEGATIVE 10/16/2015 1606   BILIRUBINUR NEGATIVE 10/16/2015 1606   KETONESUR 15 (A) 10/16/2015 1606   PROTEINUR NEGATIVE 10/16/2015 1606   UROBILINOGEN 1.0 05/22/2009 2346   NITRITE NEGATIVE 10/16/2015 1606   LEUKOCYTESUR NEGATIVE 10/16/2015 1606   Sepsis Labs Invalid input(s): PROCALCITONIN,  WBC,  LACTICIDVEN Microbiology No results found for this or any previous visit (from the past 240 hour(s)).  Time spent: 30 min  SIGNED:   Marylu Lund, MD  [facility] 09/19/2017, 5:49 PM  If 7PM-7AM, please contact night-coverage

## 2017-09-19 NOTE — Progress Notes (Addendum)
Progress Note  Patient Name: [name] Date of Encounter: 09/19/2017  Primary Cardiologist: Glenetta Hew, MD  Subjective   Patient reports chest pain, rated 1-3 of 10 and at baseline for him.   No complaints reported otherwise.  Wife and sister-in-law are present with him at the time of his exam this morning. We again reviewed the results of the yesterday's cardiac cath / impression that the chest pain is non-cardiac in etiology.   While in the room, monitor showed normotensive BP; however, earlier readings recorded as hypertensive. Bradycardic. Plan for medical management as below in A/P.  Inpatient Medications    Scheduled Meds: . aspirin EC  81 mg Oral Daily  . atorvastatin  80 mg Oral q1800  . brinzolamide  1 drop Both Eyes TID   And  . brimonidine  1 drop Both Eyes TID  . cholecalciferol  1,000 Units Oral Daily  . clopidogrel  75 mg Oral Daily  . cyclobenzaprine  5 mg Oral TID  . doxazosin  2 mg Oral QHS  . enoxaparin (LOVENOX) injection  40 mg Subcutaneous Q24H  . fluticasone  1 spray Each Nare Daily  . gabapentin  300 mg Oral TID  . insulin aspart  0-5 Units Subcutaneous QHS  . insulin aspart  0-9 Units Subcutaneous TID WC  . insulin glargine  24 Units Subcutaneous QHS  . isosorbide mononitrate  30 mg Oral Daily  . latanoprost  1 drop Right Eye QHS  . omega-3 acid ethyl esters  1 g Oral Daily  . pantoprazole  40 mg Oral Daily  . prednisoLONE acetate  1 drop Left Eye BID  . saccharomyces boulardii  250 mg Oral BID  . sodium chloride flush  3 mL Intravenous Q12H  . timolol  1 drop Both Eyes Daily  . vitamin C  1,000 mg Oral Daily   Continuous Infusions: . sodium chloride 50 mL/hr at 09/17/17 1510  . sodium chloride     PRN Meds: sodium chloride, acetaminophen, hydrALAZINE, loratadine, morphine injection, nitroGLYCERIN, ondansetron (ZOFRAN) IV, sodium chloride flush   Vital Signs    Vitals:   09/18/17 2038 09/18/17 2356 09/19/17 0230 09/19/17 0500    BP: (!) 141/58 (!) 116/49 (!) 139/54 (!) 158/57  Pulse: 81 68 62 (!) 59  Resp: 16   16  Temp: 98 F (36.7 C)   98.4 F (36.9 C)  TempSrc: Oral   Oral  SpO2: 97% 96% 98%   Weight:    64 kg  Height:        Intake/Output Summary (Last 24 hours) at 09/19/2017 1047 Last data filed at 09/19/2017 5035 Gross per 24 hour  Intake 714.37 ml  Output 750 ml  Net -35.63 ml   Filed Weights   09/17/17 0535 09/18/17 0546 09/19/17 0500  Weight: 63.4 kg 63.1 kg 64 kg    Telemetry    Sinus bradycardia- Personally Reviewed  Physical Exam    General: Well developed, well nourished, male appearing in no acute distress. Head: Normocephalic, atraumatic.  Neck: Supple without bruits, JVD. Lungs:  Resp regular and unlabored, CTA. Heart: RRR, S1, S2, soft systolic murmur; no rub. Abdomen: Soft, non-tender, non-distended with normoactive bowel sounds. Extremities: No clubbing, cyanosis, edema. Distal pedal pulses are 2+ bilaterally. Neuro: Alert and oriented X 3. Moves all extremities spontaneously. Psych: Normal affect.  Labs    Chemistry Recent Labs  Lab 09/18/17 0543 09/19/17 0420  NA 139 140  K 3.8 3.9  CL 107 107  CO2 21* 21*  GLUCOSE 158* 234*  BUN 14 18  CREATININE 1.06 1.13  CALCIUM 8.8* 9.0  GFRNONAA >60 58*  GFRAA >60 >60  ANIONGAP 11 12     Hematology Recent Labs  Lab 09/17/17 0101 09/18/17 0543 09/19/17 0420  WBC 6.9 6.3 13.9*  RBC 4.21* 3.96* 4.01*  HGB 12.9* 12.1* 12.3*  HCT 37.4* 36.1* 36.1*  MCV 88.8 91.2 90.0  MCH 30.6 30.6 30.7  MCHC 34.5 33.5 34.1  RDW 12.0 12.3 12.1  PLT 178 145* 171    Cardiac Enzymes Recent Labs  Lab 09/17/17 1047 09/17/17 1623 09/17/17 2205  TROPONINI <0.03 <0.03 <0.03   No results for input(s): TROPIPOC in the last 168 hours.   BNPNo results for input(s): BNP, PROBNP in the last 168 hours.   DDimer No results for input(s): DDIMER in the last 168 hours.    Radiology    No results found.  Cardiac Studies    LHC 09/18/2017  Prox RCA to Mid RCA lesion is 80% stenosed.  Dist RCA lesion is 50% stenosed.  SVG graft was visualized by angiography and is normal in caliber.  Ost LM to Dist LM lesion is 90% stenosed.  Ost Cx to Prox Cx lesion is 90% stenosed.  Ost LAD to Prox LAD lesion is 100% stenosed.  Prox LAD to Mid LAD lesion is 100% stenosed.  SVG graft was visualized by angiography and is normal in caliber.  The graft exhibits no disease.  Left radial artery graft was visualized by angiography and is normal in caliber.  The graft exhibits no disease.  The left ventricular systolic function is normal.  LV end diastolic pressure is normal.  The left ventricular ejection fraction is greater than 65% by visual estimate. 1. Severe left main and 3 vessel obstructive CAD 2. Patent free radial graft to the LAD 3. Patent SVG to OM1 4. Patent SVG to distal RCA 5. Normal LV function 6. Normal LVEDP Plan: continue medical therapy. I do not see a coronary cause for his chest pain.    Patient Profile     83 y.o. male with a hx of CAD and CABG 1995 and redo 2001, chronic stable angina, cath 2016 no PCI option medical therapy recommended, could not afford Imdur, DM-2, HLD, PAD with stent to Lt subclavian artery who presented with chest pain from [facility]. 09/2017 Cath with all patent as follows: radial graft to LAD, SVG-OM1, SVG-dRCA. Normal LV EF, Nml LVEDP.  Assessment & Plan    1.Non-cardiac Chest Pain - ACS ruled out  - Troponin neg x3 - Cath performed post contrast allergy loading. Results as above indicate severe left main and 3v obstructive CAD. Patent radial graft to LAD, patent SVG-OM1, patent SVG-dRCA. Normal LV EF, Nml LVEDP.  - Non cardiac etiology of CP.  - Defer to IM for further workup. - Plan for medical therapy. Continue lovenox, ASA, statin and plavix, cardura, hydralazine, imdur. Baseline bradycardia and labile BP - no  blocker at this time.   2.  Significant CAD with CABG 1995 and Re-do 2001 to LAD with free radial last cath 50 % prox stenosis in this vessel and other grafts patent:   - Pt could not afford Ranexa PTA   - No BB with significant bradycardia, labile BP.   - Continue home imdur and cardiac medications / medical management above.   3. HLD:  - Previously on Zocor with medications received through the [place].  - Reports no intolerance to  statins. Switched to Lipitor 80mg  on admission, also on Lovasa. - Liver function follow-up outpatient labs recommended as change in statin therapy and per guidelines.   - 9/16 LDL 80 and not at goal.  - Continue aggressive statin therapy and recommendation for lifestyle changes for better control of lipids.   4. HTN:   - Recent elevation, labile BP with bradycardia so no  blocker at this time. BP had been controlled on cardura, Imdur 30.  - Imdur restarted as on nitro SL tab now (held earlier with pt on IV nitro)  - Continue medical management.  5. DM:  - SSI  - Per IM   Arvil Chaco 09/19/2017 10:47 AM   I have personally seen and examined this patient. I agree with the assessment and plan as outlined above.  He is admitted with chest pain. Troponin negative. EKG without ischemic changes. Cardiac cath with severe three vessel CAD with patent bypass grafts. No focal target lesions were seen. Will continue medical management of CAD. I do not think his chest pain is cardiac related. Consider GI workup. He is already on a PPI.  No further cardiac workup. He will need follow up with Dr. Ellyn Hack or his care team APP after discharge.   [facility] will sign off.   Medication Recommendations:  Continue current medications Other recommendations: None Follow up as an outpatient: Follow up as previously planned with Dr. Loney Loh [facility] 09/19/2017 12:07 PM

## 2017-10-02 NOTE — Progress Notes (Signed)
Cardiology Office Note   Date:  10/05/2017   ID:  JAHBARI REPINSKI, DOB 05/14/1932, MRN [id number]  PCP:  Rory Percy, MD  Cardiologist:  Dr. Ellyn Hack  Chief Complaint  Patient presents with  . Follow-up    post cath, no stents placed     History of Present Illness: BRANDON WIECHMAN is a 84 y.o. male who presents for ongoing assessment and management of CAD, chronic angina, hx of CABG in 1995, redo in 2001, last cardiac cath in 2016 with no options for PCI and medical therapy recommended. Other history includes Type II diabetes, HLD, and PAD with stent to the left subclavian. He was last seen by cardiology while hospitalized on 09/17/2017, for recurrent chest pain worrisome for unstable angina. He was scheduled for cardiac cath.  Cath on 09/18/2017 demonstrated severe LM and 3 vessel obstructive CAD, patent graphs, and normal LV fx. He was continued on medical therapy.   He continues to have chronic pain. He does not feel any better since being seen last. He has been intolerant to Ranxea   Past Medical History:  Diagnosis Date  . Asymptomatic stenosis of left vertebral artery 2002, 2005   Status post PTA/stent with redo; normal antegrade flow on dopplers 09/2013  . CAD (coronary artery disease) 1995   1CABG x3; redo in CABG x 1 2001 Free Radial to LAD; All grafts patent by Cath 08/2014: The proximal to mid LAD has poor retrograde filling from the LIMA due to in-stent restenosis. 50% ostial disease of free radial artery to the LAD.  [name] CAD (coronary artery disease) of artery bypass graft 2000   PCI x 2 - ostial & prox-mid LAD (BMS) for atretic LIMA-LAD  . CAD in native artery 1995   CABG x 3 - LIMA-LAD, SVG-OM2, SVG-rPDA  . Chest pain 09/2017  . DM (diabetes mellitus) type II controlled peripheral vascular disorder 2002   Left subclavian and left vertebral artery stenoses  . Glaucoma   . Hyperlipidemia LDL goal <70   . Hypertension, essential   . S/P CABG x 3 1995   . S/P Redo CABG x 1  2001   L Radial-LAD after 2 failed attempts @ LIMA-LAD PTCA; LAD stents 100% occluded  . Stenosis of left subclavian artery ([place]) 11/2003   Status post PTA/stent --> < 50 % stenosis by Dopplers 09/2013    Past Surgical History:  Procedure Laterality Date  . CARDIAC CATHETERIZATION N/A 08/04/2014   Procedure: Left Heart Cath and Cors/Grafts Angiography;  Surgeon: Leonie Man, MD;  Location: [facility];  Service: Cardiovascular: o-mRCA 70%, m-dRCA ~70% - SVG-dRCA Patent.  o-pLAD 100% stent occluded, mLAD 95% ISR with poor retrograde filling from freeRadiial graft-LAD (50% ostial graft dz).  o-p Cx 80%. Widely patent SVG-Cx-OM fills retrograde to 80% lesion.; Normal LV Fxn & EDP  . CORONARY ANGIOPLASTY  April and May 2001   After Both LAD stents occluded - PTCA of anastomatic LIMA-LAD lesion -- Unsuccessful.  . CORONARY ARTERY BYPASS GRAFT  1995    LIMA-LAD, SVG-OM2, SVG-rPDA  . CORONARY ARTERY BYPASS GRAFT  June 2001   Dr. Lucianne Lei Trigt: Redo LAD grafting with free Left Radial-distal LAD  . CORONARY STENT PLACEMENT  1995-2000   2 BMS stents to osital-proximal & proximal-mid LAD; because of atretic LIMA-LDA  . LEFT HEART CATH AND CORONARY ANGIOGRAPHY N/A 09/18/2017   Procedure: LEFT HEART CATH AND CORONARY ANGIOGRAPHY;  Surgeon: Martinique, Peter M, MD;  Location: [facility];  Service: Cardiovascular;  Laterality: N/A;  . LEFT HEART CATHETERIZATION WITH CORONARY/GRAFT ANGIOGRAM N/A 08/22/2011   Procedure: LEFT HEART CATHETERIZATION WITH Beatrix Fetters;  Surgeon: Leonie Man, MD;  Location: [facility];  Service: Cardiovascular:  Known occluded LIMA-LAD and ostial LAD. Moderate to severe proximal circumflex and RCA disease. Widely patent freeLRAD-dLAD, as well as SVG-RPDA (backfilling RPL), SVG-OM 2 (backfilling OM1)  . SHOULDER OPEN ROTATOR CUFF REPAIR  October 2004   Dr. Noemi Chapel  . SP Ninfa Meeker OR THOR CAROTID STENT Left October 2002; November 2005   Left Vertebral  stent placed in October 2002 (Dr. Patrecia Pour); redo PCI in 2005  . SUBCLAVIAN ARTERY STENT Left November 2005   Dr. Gwenlyn Found     Current Outpatient Medications  Medication Sig Dispense Refill  . acetaminophen (TYLENOL) 500 MG tablet Take 1,000 mg by mouth every 6 (six) hours as needed for mild pain or headache.    [name] acyclovir (ZOVIRAX) 400 MG tablet Take 400 mg by mouth 2 (two) times daily with a meal.   3  . aspirin EC 81 MG tablet Take 81 mg by mouth daily with breakfast.     . Brinzolamide-Brimonidine (SIMBRINZA) 1-0.2 % SUSP Place 1 drop into both eyes 3 (three) times daily.    . Cholecalciferol (VITAMIN D) 2000 units tablet Take 2,000 Units by mouth daily with breakfast.    . clopidogrel (PLAVIX) 75 MG tablet Take 1 tablet (75 mg total) by mouth daily. (Patient taking differently: Take 75 mg by mouth daily with breakfast. ) 30 tablet 1  . doxazosin (CARDURA) 8 MG tablet Take 8 mg by mouth at bedtime.     . fluticasone (FLONASE) 50 MCG/ACT nasal spray Place 1 spray into both nostrils daily as needed (seasonal allergies).     . gabapentin (NEURONTIN) 300 MG capsule Take 600 mg by mouth See admin instructions. Take two capsules (600 mg) by mouth three times daily - breakfast, supper and bedtime    . GLOBAL INJECT EASE LANCETS 30G MISC DIABETIC CLUB - USE TO CHECK BLOOD GLUCOSE LEVELS  12  . glucose blood test strip DIABETIC CLUB - USE TO CHECK BLOOD GLUCOSE LEVELS  12  . hydrocortisone cream 1 % Apply 1 application topically daily as needed for itching.    . Insulin Aspart (NOVOLOG FLEXPEN [place]) Inject 16 Units into the skin 3 (three) times daily before meals.     . Insulin Glargine (LANTUS SOLOSTAR) 100 UNIT/ML Solostar Pen Inject 26 Units into the skin at bedtime.     . isosorbide mononitrate (IMDUR) 60 MG 24 hr tablet May take 1 and 1/2 tablets to 2 tablets a day depending on if chest discomfort is present 60 tablet 6  . latanoprost (XALATAN) 0.005 % ophthalmic solution Place 1 drop into the  right eye at bedtime.     [name] loratadine (CLARITIN) 10 MG tablet Take 10 mg by mouth daily as needed (seasonal allergies).     . Omega-3 Fatty Acids (FISH OIL) 1000 MG CAPS Take 1,000 mg by mouth 2 (two) times daily with a meal.     . pantoprazole (PROTONIX) 40 MG tablet Take 40 mg by mouth daily with breakfast.    . prednisoLONE acetate (PRED FORTE) 1 % ophthalmic suspension Place 1 drop into the left eye 2 (two) times daily.    . simvastatin (ZOCOR) 80 MG tablet Take 40 mg by mouth at bedtime.     . timolol (TIMOPTIC) 0.5 % ophthalmic solution Place 1 drop into  both eyes daily.     . vitamin C (ASCORBIC ACID) 500 MG tablet Take 500 mg by mouth 2 (two) times daily with a meal.      No current facility-administered medications for this visit.     Allergies:   Iohexol; Contrast media [iodinated diagnostic agents]; and Metformin and related    Social History:  The patient  reports that he has quit smoking. His smoking use included cigarettes. He has a 90.00 pack-year smoking history. He has never used smokeless tobacco. He reports that he does not drink alcohol or use drugs.   Family History:  The patient's family history includes Diabetes in his brother, mother, and sister.    ROS: All other systems are reviewed and negative. Unless otherwise mentioned in H&P    PHYSICAL EXAM: VS:  Ht 5\' 7"  (1.702 m)   Wt 144 lb 3.2 oz (65.4 kg)   BMI 22.58 kg/m  , BMI Body mass index is 22.58 kg/m. GEN: Well nourished, well developed, in no acute distress HEENT: normal Neck: no JVD, carotid bruits, or masses Cardiac:RRR; no murmurs, rubs, or gallops,no edema  Respiratory:  Clear to auscultation bilaterally, normal work of breathing GI: soft, nontender, nondistended, + BS MS: no deformity or atrophy. Cath insertion site well healed.  Skin: warm and dry, no rash Neuro:  Strength and sensation are intact Psych: euthymic mood, full affect   EKG:  Not completed this office visit.   Recent  Labs: 09/19/2017: BUN 18; Creatinine, Ser 1.13; Hemoglobin 12.3; Platelets 171; Potassium 3.9; Sodium 140    Lipid Panel    Component Value Date/Time   CHOL 131 09/18/2017 0543   TRIG 110 09/18/2017 0543   HDL 29 (L) 09/18/2017 0543   CHOLHDL 4.5 09/18/2017 0543   VLDL 22 09/18/2017 0543   LDLCALC 80 09/18/2017 0543      Wt Readings from Last 3 Encounters:  10/05/17 144 lb 3.2 oz (65.4 kg)  09/19/17 141 lb 1.5 oz (64 kg)  11/21/16 146 lb 12.8 oz (66.6 kg)      Other studies Reviewed: Cardiac Cath 09/18/2017     Prox RCA to Mid RCA lesion is 80% stenosed.  Dist RCA lesion is 50% stenosed.  SVG graft was visualized by angiography and is normal in caliber.  Ost LM to Dist LM lesion is 90% stenosed.  Ost Cx to Prox Cx lesion is 90% stenosed.  Ost LAD to Prox LAD lesion is 100% stenosed.  Prox LAD to Mid LAD lesion is 100% stenosed.  SVG graft was visualized by angiography and is normal in caliber.  The graft exhibits no disease.  Left radial artery graft was visualized by angiography and is normal in caliber.  The graft exhibits no disease.  The left ventricular systolic function is normal.  LV end diastolic pressure is normal.  The left ventricular ejection fraction is greater than 65% by visual estimate.   1. Severe left main and 3 vessel obstructive CAD 2. Patent free radial graft to the LAD 3. Patent SVG to OM1 4. Patent SVG to distal RCA 5. Normal LV function 6. Normal LVEDP  Plan: continue medical therapy. I do not see a coronary cause for his chest pain.     ASSESSMENT AND PLAN:  1. CAD: Known history of severe 3 vessel disease. He has had a CABG and recent cath on 09/18/2017. His grafts were patent. He will continue on isosorbide. In the past we tried to decrease the dose but pain worsened.  He did not tolerate Ranexa due to dizziness. I have recommended referral to pain management. He has sen them in the past, and should go back for more  recommendations.   2. Hypercholesterolemia: Continue statin therapy. Doubt pain is related to myalgia pain, remains on simvastatin 80 mg daily.  3. Hypertension: BP is slightly elevated today. He is having some pain in his back. Will follow this. May need to titrate if remains elevated.   4. Chronic pain: Follow up with pain management.   Current medicines are reviewed at length with the patient today.    Labs/ tests ordered today include: None   Phill Myron. West Pugh, ANP, AACC   10/05/2017 8:16 AM    [address] Office [phone] Fax [phone]

## 2017-10-05 ENCOUNTER — Encounter: Payer: Self-pay | Admitting: Adult Health

## 2017-10-05 ENCOUNTER — Ambulatory Visit: Payer: Medicare Other | Admitting: Adult Health

## 2017-10-05 VITALS — BP 165/70 | HR 61 | Ht 67.0 in | Wt 144.2 lb

## 2017-10-05 DIAGNOSIS — E7800 Pure hypercholesterolemia, unspecified: Secondary | ICD-10-CM

## 2017-10-05 DIAGNOSIS — I2510 Atherosclerotic heart disease of native coronary artery without angina pectoris: Secondary | ICD-10-CM

## 2017-10-05 DIAGNOSIS — I10 Essential (primary) hypertension: Secondary | ICD-10-CM

## 2017-10-05 NOTE — Patient Instructions (Signed)
Medication Instructions:  NO CHANGES- Your physician recommends that you continue on your current medications as directed. Please refer to the Current Medication list given to you today.  If you need a refill on your cardiac medications before your next appointment, please call your pharmacy.  Follow-Up: Your physician wants you to follow-up in: 6 months WITH DR Ellyn Hack You should receive a reminder letter in the mail two months in advance. If you do not receive a letter, please call our office in FEB 2020 to schedule your April 2020 follow-up appointment.   Thank you for choosing [facility]!!

## 2017-10-18 DIAGNOSIS — E11649 Type 2 diabetes mellitus with hypoglycemia without coma: Secondary | ICD-10-CM | POA: Diagnosis not present

## 2017-10-18 DIAGNOSIS — E162 Hypoglycemia, unspecified: Secondary | ICD-10-CM | POA: Diagnosis not present

## 2017-10-18 DIAGNOSIS — E1165 Type 2 diabetes mellitus with hyperglycemia: Secondary | ICD-10-CM | POA: Diagnosis not present

## 2017-10-18 DIAGNOSIS — Z6824 Body mass index (BMI) 24.0-24.9, adult: Secondary | ICD-10-CM | POA: Diagnosis not present

## 2017-10-18 DIAGNOSIS — I10 Essential (primary) hypertension: Secondary | ICD-10-CM | POA: Diagnosis not present

## 2017-12-18 DIAGNOSIS — Z6824 Body mass index (BMI) 24.0-24.9, adult: Secondary | ICD-10-CM | POA: Diagnosis not present

## 2017-12-18 DIAGNOSIS — J069 Acute upper respiratory infection, unspecified: Secondary | ICD-10-CM | POA: Diagnosis not present

## 2018-02-16 DIAGNOSIS — J209 Acute bronchitis, unspecified: Secondary | ICD-10-CM | POA: Diagnosis not present

## 2018-02-16 DIAGNOSIS — Z6824 Body mass index (BMI) 24.0-24.9, adult: Secondary | ICD-10-CM | POA: Diagnosis not present

## 2018-02-22 DIAGNOSIS — E1165 Type 2 diabetes mellitus with hyperglycemia: Secondary | ICD-10-CM | POA: Diagnosis not present

## 2018-02-22 DIAGNOSIS — E11649 Type 2 diabetes mellitus with hypoglycemia without coma: Secondary | ICD-10-CM | POA: Diagnosis not present

## 2018-02-22 DIAGNOSIS — Z6824 Body mass index (BMI) 24.0-24.9, adult: Secondary | ICD-10-CM | POA: Diagnosis not present

## 2018-02-22 DIAGNOSIS — E162 Hypoglycemia, unspecified: Secondary | ICD-10-CM | POA: Diagnosis not present

## 2018-02-22 DIAGNOSIS — I10 Essential (primary) hypertension: Secondary | ICD-10-CM | POA: Diagnosis not present

## 2018-04-27 DIAGNOSIS — Z0001 Encounter for general adult medical examination with abnormal findings: Secondary | ICD-10-CM | POA: Diagnosis not present

## 2018-04-27 DIAGNOSIS — E1165 Type 2 diabetes mellitus with hyperglycemia: Secondary | ICD-10-CM | POA: Diagnosis not present

## 2018-08-03 DIAGNOSIS — I10 Essential (primary) hypertension: Secondary | ICD-10-CM | POA: Diagnosis not present

## 2018-08-03 DIAGNOSIS — E1165 Type 2 diabetes mellitus with hyperglycemia: Secondary | ICD-10-CM | POA: Diagnosis not present

## 2018-08-31 DIAGNOSIS — Z947 Corneal transplant status: Secondary | ICD-10-CM | POA: Diagnosis not present

## 2018-09-03 DIAGNOSIS — E1165 Type 2 diabetes mellitus with hyperglycemia: Secondary | ICD-10-CM | POA: Diagnosis not present

## 2018-09-03 DIAGNOSIS — I10 Essential (primary) hypertension: Secondary | ICD-10-CM | POA: Diagnosis not present

## 2018-09-03 DIAGNOSIS — E7800 Pure hypercholesterolemia, unspecified: Secondary | ICD-10-CM | POA: Diagnosis not present

## 2018-09-07 LAB — HEMOGLOBIN A1C: Hemoglobin A1C: 8.8

## 2018-10-02 ENCOUNTER — Other Ambulatory Visit: Payer: Self-pay

## 2018-10-02 ENCOUNTER — Encounter: Payer: Self-pay | Admitting: Cardiology

## 2018-10-02 ENCOUNTER — Ambulatory Visit (INDEPENDENT_AMBULATORY_CARE_PROVIDER_SITE_OTHER): Payer: Medicare Other | Admitting: Cardiology

## 2018-10-02 VITALS — BP 172/66 | HR 63 | Temp 98.1°F | Ht 67.0 in | Wt 145.8 lb

## 2018-10-02 DIAGNOSIS — Z951 Presence of aortocoronary bypass graft: Secondary | ICD-10-CM | POA: Diagnosis not present

## 2018-10-02 DIAGNOSIS — R42 Dizziness and giddiness: Secondary | ICD-10-CM

## 2018-10-02 DIAGNOSIS — I10 Essential (primary) hypertension: Secondary | ICD-10-CM | POA: Diagnosis not present

## 2018-10-02 DIAGNOSIS — I25119 Atherosclerotic heart disease of native coronary artery with unspecified angina pectoris: Secondary | ICD-10-CM

## 2018-10-02 DIAGNOSIS — I739 Peripheral vascular disease, unspecified: Secondary | ICD-10-CM | POA: Diagnosis not present

## 2018-10-02 DIAGNOSIS — R0989 Other specified symptoms and signs involving the circulatory and respiratory systems: Secondary | ICD-10-CM | POA: Diagnosis not present

## 2018-10-02 DIAGNOSIS — E785 Hyperlipidemia, unspecified: Secondary | ICD-10-CM

## 2018-10-02 MED ORDER — AMLODIPINE BESYLATE 2.5 MG PO TABS: 2.5000 mg | ORAL_TABLET | Freq: Every day | ORAL | 3 refills | Status: DC

## 2018-10-02 NOTE — Patient Instructions (Addendum)
Medication Instructions:    -START AMLODIPINE 2,5 MG - ONE TABLET IN THE EVENING  DAILY  NO OTHER CHANGES   If you need a refill on your cardiac medications before your next appointment, please call your pharmacy.   Lab work:  NOT NEEDED  Testing/Procedures: WILL BE SCHEDULE AT [place] [address] Your physician has requested that you have a carotid duplex. This test is an ultrasound of the carotid arteries in your neck. It looks at blood flow through these arteries that supply the brain with blood. Allow one hour for this exam. There are no restrictions or special instructions.  Follow-Up: At [facility], you and your health needs are our priority.  As part of our continuing mission to provide you with exceptional heart care, we have created designated Provider Care Teams.  These Care Teams include your primary Cardiologist (physician) and Advanced Practice Providers (APPs -  Physician Assistants and Nurse Practitioners) who all work together to provide you with the care you need, when you need it. . You will need a follow up appointment in 12  months.  Please call our office 2 months in advance to schedule this appointment.  You may see Glenetta Hew, MD or one of the following Advanced Practice Providers on your designated Care Team:   . Rosaria Ferries, PA-C . Jory Sims, DNP, ANP- Your physician recommends that you schedule a follow-up appointment in 6 MONTHS .   Any Other Special Instructions Will Be Listed Below (If Applicable).

## 2018-10-02 NOTE — Progress Notes (Signed)
PCP: Rory Percy, MD  Clinic Note: Chief Complaint  Patient presents with  . Follow-up    Short of breath with exertion, fatigue  . Coronary Artery Disease    Not actively having angina, but has been taking some occasional nitroglycerin.  [name] Hypertension    HPI: [name] is a 85 y.o. male with a PMH of CAD-CABG (1995, re-do in 2001)- chronic stable angina, DM2, HLD & HTN who presents today for annual follow-up. Last heart catheterization was in 2016 - no PCI option as the only new lesion was ISR in the LAD upstream from LIMA insertion.--> Med Rx only -->   Ranexa - too expensive, changed to Imdur.  Status post left corneal transplant x2  I last saw him in Nov 2018.   [name] was last seen in Oct 2019 by Jory Sims, NP for Cath f/u  -His PCP had stopped lisinopril due to low blood pressure.  He noted increased heart rate spells.  Noted some exertional chest pain but not significant.  Noted vertiginous dizziness.  He has stopped his HCTZ in addition to the ACE inhibitor was originally stopped by PCP.  Recent Hospitalizations:   None since 09/16/17  Studies Reviewed:   Cath 09/18/2017: Severe LM & 3 V CAD. Patent fRad-LAD, SVG-OM1, SVG-dRCA. Normal LV Fxn & EDP.  Interval History: [name] presents today seemingly and is somewhat down mood.  He just says that he hurts all over and has been hurting all over and feeling short of breath with exertion since his most recent CABG.  He has diffuse chest discomfort areas including under the caudal and left lateral to the sternum that are somewhat reproducible with movements or palpation.  He says a lot of his walking activity is limited by his diabetic neuropathy.  As such, he really does not do much exercise.  Mostly what he notes is his shortness of breath with exertion and he seems like this is pretty similar to what he is been having.  He says with the higher blood pressures, he has less of the orthostatic dizziness  type symptoms, but now has poorly controlled blood pressure and with higher pressures is noticing more chest pressure and pain. He denies any rapid irregular heartbeats or palpitations.  No syncope/near syncope or TIA/amaurosis fugax.  He seems to be somewhat down today, commenting on how bad things are including his change in blood pressure, diabetes, neuropathy etc.  With having reduce his blood pressure medications last visit, he has noted his blood pressures have been notably higher but not dramatically high.  He is less dizzy with less orthostatic symptoms..  He denies any PND, orthopnea or edema.  No melena, hematochezia, hematuria or claudication.  ROS: A comprehensive was performed. Review of Systems  Constitutional: Negative for fever and malaise/fatigue.  HENT: Positive for hearing loss. Negative for congestion and nosebleeds.   Respiratory: Positive for shortness of breath (with exertion; less pronounced). Negative for cough (Less pronounced).   Gastrointestinal: Negative for diarrhea, nausea and vomiting.       No recent GI illnesses  Genitourinary: Negative for hematuria.  Musculoskeletal: Positive for back pain and neck pain. Negative for falls (near falls).       He just hurts "all over "  Neurological: Positive for dizziness (Less orthostasis.). Negative for loss of consciousness (He has lightheadedness and near syncope with hypoglycemia.  Less orthostatic dizziness) and weakness (He gets weak and tires out early.).  He does have positional vertigo but he also has dizziness associated with hypoglycemia.  Psychiatric/Behavioral: Positive for memory loss. The patient is not nervous/anxious.   All other systems reviewed and are negative.   Past Medical History:  Diagnosis Date  . Asymptomatic stenosis of left vertebral artery 2002, 2005   Status post PTA/stent with redo; normal antegrade flow on dopplers 09/2013  . CAD (coronary artery disease) 1995   1CABG x3; redo  in CABG x 1 2001 Free Radial to LAD; All grafts patent by Cath 08/2014: The proximal to mid LAD has poor retrograde filling from the LIMA due to in-stent restenosis. 50% ostial disease of free radial artery to the LAD.  [name] CAD (coronary artery disease) of artery bypass graft 2000   PCI x 2 - ostial & prox-mid LAD (BMS) for atretic LIMA-LAD  . CAD in native artery 1995   CABG x 3 - LIMA-LAD, SVG-OM2, SVG-rPDA  . Chest pain 09/2017  . DM (diabetes mellitus) type II controlled peripheral vascular disorder 2002   Left subclavian and left vertebral artery stenoses  . Glaucoma   . Hyperlipidemia LDL goal <70   . Hypertension, essential   . S/P CABG x 3 1995   . S/P Redo CABG x 1 2001   L Radial-LAD after 2 failed attempts @ LIMA-LAD PTCA; LAD stents 100% occluded  . Stenosis of left subclavian artery ([place]) 11/2003   Status post PTA/stent --> < 50 % stenosis by Dopplers 09/2013    Past Surgical History:  Procedure Laterality Date  . CARDIAC CATHETERIZATION N/A 08/04/2014   Procedure: Left Heart Cath and Cors/Grafts Angiography;  Surgeon: Leonie Man, MD;  Location: [facility];  Service: Cardiovascular: o-mRCA 70%, m-dRCA ~70% - SVG-dRCA Patent.  o-pLAD 100% stent occluded, mLAD 95% ISR with poor retrograde filling from freeRadiial graft-LAD (50% ostial graft dz).  o-p Cx 80%. Widely patent SVG-Cx-OM fills retrograde to 80% lesion.; Normal LV Fxn & EDP  . CORONARY ANGIOPLASTY  April and May 2001   After Both LAD stents occluded - PTCA of anastomatic LIMA-LAD lesion -- Unsuccessful.  . CORONARY ARTERY BYPASS GRAFT  1995    LIMA-LAD, SVG-OM2, SVG-rPDA  . CORONARY ARTERY BYPASS GRAFT  June 2001   Dr. Lucianne Lei Trigt: Redo LAD grafting with free Left Radial-distal LAD  . CORONARY STENT PLACEMENT  1995-2000   2 BMS stents to osital-proximal & proximal-mid LAD; because of atretic LIMA-LDA  . LEFT HEART CATH AND CORONARY ANGIOGRAPHY N/A 09/18/2017   Procedure: LEFT HEART CATH AND CORONARY  ANGIOGRAPHY;  Surgeon: Martinique, Peter M, MD;  Location: [facility];  Service: Cardiovascular;  Laterality: N/A;  . LEFT HEART CATHETERIZATION WITH CORONARY/GRAFT ANGIOGRAM N/A 08/22/2011   Procedure: LEFT HEART CATHETERIZATION WITH Beatrix Fetters;  Surgeon: Leonie Man, MD;  Location: [facility];  Service: Cardiovascular:  Known occluded LIMA-LAD and ostial LAD. Moderate to severe proximal circumflex and RCA disease. Widely patent freeLRAD-dLAD, as well as SVG-RPDA (backfilling RPL), SVG-OM 2 (backfilling OM1)  . SHOULDER OPEN ROTATOR CUFF REPAIR  October 2004   Dr. Noemi Chapel  . SP Ninfa Meeker OR THOR CAROTID STENT Left October 2002; November 2005   Left Vertebral stent placed in October 2002 (Dr. Patrecia Pour); redo PCI in 2005  . SUBCLAVIAN ARTERY STENT Left November 2005   Dr. Gwenlyn Found    Current Meds  Medication Sig  . acetaminophen (TYLENOL) 500 MG tablet Take 1,000 mg by mouth every 6 (six) hours as needed for mild  pain or headache.  [name] acyclovir (ZOVIRAX) 400 MG tablet Take 400 mg by mouth 2 (two) times daily with a meal.   . aspirin EC 81 MG tablet Take 81 mg by mouth daily with breakfast.   . Brinzolamide-Brimonidine (SIMBRINZA) 1-0.2 % SUSP Place 1 drop into both eyes 3 (three) times daily.  . Cholecalciferol (VITAMIN D) 2000 units tablet Take 2,000 Units by mouth daily with breakfast.  . clopidogrel (PLAVIX) 75 MG tablet Take 1 tablet (75 mg total) by mouth daily. (Patient taking differently: Take 75 mg by mouth daily with breakfast. )  . doxazosin (CARDURA) 8 MG tablet Take 8 mg by mouth at bedtime.   . fluticasone (FLONASE) 50 MCG/ACT nasal spray Place 1 spray into both nostrils daily as needed (seasonal allergies).   . gabapentin (NEURONTIN) 300 MG capsule Take 600 mg by mouth See admin instructions. Take two capsules (600 mg) by mouth three times daily - breakfast, supper and bedtime  . GLOBAL INJECT EASE LANCETS 30G MISC DIABETIC CLUB - USE TO CHECK BLOOD GLUCOSE  LEVELS  . glucose blood test strip DIABETIC CLUB - USE TO CHECK BLOOD GLUCOSE LEVELS  . hydrocortisone cream 1 % Apply 1 application topically daily as needed for itching.  . Insulin Aspart (NOVOLOG FLEXPEN [place]) Inject 16 Units into the skin 3 (three) times daily before meals.   . Insulin Glargine (LANTUS SOLOSTAR) 100 UNIT/ML Solostar Pen Inject 26 Units into the skin at bedtime.   . isosorbide mononitrate (IMDUR) 60 MG 24 hr tablet May take 1 and 1/2 tablets to 2 tablets a day depending on if chest discomfort is present  . latanoprost (XALATAN) 0.005 % ophthalmic solution Place 1 drop into the right eye at bedtime.   [name] loratadine (CLARITIN) 10 MG tablet Take 10 mg by mouth daily as needed (seasonal allergies).   . Omega-3 Fatty Acids (FISH OIL) 1000 MG CAPS Take 1,000 mg by mouth 2 (two) times daily with a meal.   . pantoprazole (PROTONIX) 40 MG tablet Take 40 mg by mouth daily with breakfast.  . prednisoLONE acetate (PRED FORTE) 1 % ophthalmic suspension Place 1 drop into the left eye 2 (two) times daily.  . simvastatin (ZOCOR) 80 MG tablet Take 40 mg by mouth at bedtime.   . timolol (TIMOPTIC) 0.5 % ophthalmic solution Place 1 drop into both eyes daily.   . vitamin C (ASCORBIC ACID) 500 MG tablet Take 500 mg by mouth 2 (two) times daily with a meal.     Allergies  Allergen Reactions  . Iohexol Other (See Comments)    Intractable shaking.  . Contrast Media [Iodinated Diagnostic Agents]     Shivering & shaking. Pt reports takes antihistamine prior to procedures.  . Metformin And Related Diarrhea    Subsequently discontinued   Social History   Tobacco Use  . Smoking status: Former Smoker    Packs/day: 3.00    Years: 30.00    Pack years: 90.00    Types: Cigarettes  . Smokeless tobacco: Never Used  . Tobacco comment: quit smoking about 50 years ago  Substance Use Topics  . Alcohol use: No  . Drug use: No   Social History   Social History Narrative   He is married with one  daughter. He has 2 grandchildren.   He does not smoke. He quit smoking in roughly 1970 after smoking 3 packs per day.   He is routinely at give him. It does not necessarily do routine exercise.  family history includes Diabetes in his brother, mother, and sister.  Wt Readings from Last 3 Encounters:  10/02/18 145 lb 12.8 oz (66.1 kg)  10/05/17 144 lb 3.2 oz (65.4 kg)  09/19/17 141 lb 1.5 oz (64 kg)    PHYSICAL EXAM BP (!) 172/66   Pulse 63   Temp 98.1 F (36.7 C)   Ht 5\' 7"  (1.702 m)   Wt 145 lb 12.8 oz (66.1 kg)   SpO2 93%   BMI 22.84 kg/m   No data found.  Physical Exam  Constitutional: He is oriented to person, place, and time. He appears well-developed and well-nourished. He is active. No distress.  Well-groomed.  HENT:  Head: Normocephalic and atraumatic.  Neck: Normal range of motion. Neck supple. No hepatojugular reflux and no JVD present. Carotid bruit is present (Soft bilateral bruits).  Cardiovascular: Normal rate, regular rhythm, normal heart sounds and intact distal pulses. Exam reveals no gallop and no friction rub.  No murmur heard. Nondisplaced PMI  Pulmonary/Chest: Effort normal and breath sounds normal. No respiratory distress. He has no wheezes. He has no rales.  Chest wall pain on palp.  Along the manubrium and left lateral sternal border  Abdominal: Soft. Bowel sounds are normal. He exhibits no distension. There is no abdominal tenderness. There is no rebound.  Musculoskeletal: Normal range of motion.        General: No edema.  Neurological: He is alert and oriented to person, place, and time.  Skin: Skin is warm and dry.  Psychiatric: His behavior is normal. Judgment and thought content normal.  Seems to have a somewhat down/dejected mood  Nursing note and vitals reviewed.    Adult ECG Report Sinus rhythm, 63 bpm.  LVH with repolarization changes.  Otherwise normal axis, intervals and durations.  Stable/normal EKG  Other studies Reviewed:  Additional studies/ records that were reviewed today include:  Recent Labs: No recent labs available Lab Results  Component Value Date   CHOL 131 09/18/2017   HDL 29 (L) 09/18/2017   [place] 80 09/18/2017   TRIG 110 09/18/2017   CHOLHDL 4.5 09/18/2017    ASSESSMENT / PLAN: Problem List Items Addressed This Visit    CAD -> CABG x3 then Re-DO CABG x1 (LRad-dLAD) after atretic LIMA & LAD stent occlusion. - Primary (Chronic)    Last cath was in 2016.  Everything seemed patent.  EF relatively normal. Most of his chest discomfort symptoms seem to be musculoskeletal costochondral type pain.  Recommend treating with Tylenol, heating press, etc.  Is no longer on beta-blocker because of bradycardia.  ACE inhibitor was stopped because of hypotension. However now he is hypertensive and still has chest discomfort. Plan: Add low-dose amlodipine to existing dose of Imdur. Seems relatively euvolemic and therefore I have not placed him on a diuretic.[name]      Relevant Medications   amLODipine (NORVASC) 2.5 MG tablet   Other Relevant Orders   EKG 12-Lead (Completed)   S/P CABG (coronary artery bypass graft): Initial CABGX3 1995 (LIMA-LAD, SVG-OM 2, SVG-RPDA) --> redo CABG x1 FreeLRad-LAD for a occluded LAD with atretic LIMA and failed attempted revascularization. (Chronic)    Grafts and stents look pretty stable last year.  Had relook cath about 4 years ago.  He is on high-dose statin (simvastatin) as well as Imdur and we are now adding amitriptyline.  No beta-blocker for issues of bradycardia and fatigue.  Remains on aspirin and Plavix.      Relevant Orders   EKG 12-Lead (  Completed)   Hyperlipidemia with target LDL less than 70 (Chronic)   Relevant Medications   amLODipine (NORVASC) 2.5 MG tablet   Essential hypertension (Chronic)    The plan was to allow for some mild permissive hypertension, but 170/60 is too high. Plan: Add amlodipine 2.5 mg daily.  ACE inhibitor was apparently stopped.   Would probably try to shoot for systolic pressure ranges in the 130 to 150 mmHg range.      Relevant Medications   amLODipine (NORVASC) 2.5 MG tablet   Peripheral arterial disease - left vertebral and subclavian artery stenosis status post PTA/stent (Chronic)    Carotid Dopplers should be being followed by vascular.  Continue aggressive risk factor modification.      Relevant Medications   amLODipine (NORVASC) 2.5 MG tablet   Other Relevant Orders   EKG 12-Lead (Completed)   VAS US CAROTID   Carotid artery bruit (Chronic)   Relevant Orders   VAS US CAROTID   Postural dizziness    Less symptomatic now that he is no longer on ACE inhibitor or beta-blocker.  Now unfortunately he is hypertensive.  I will start low-dose amlodipine in the evening.  Otherwise continue home meds.  Increase hydration.         Current medicines are reviewed at length with the patient today. (+/- concerns) n/a The following changes have been made: n/a  Patient Instructions  Medication Instructions:    -START AMLODIPINE 2,5 MG - ONE TABLET IN THE EVENING  DAILY  NO OTHER CHANGES   If you need a refill on your cardiac medications before your next appointment, please call your pharmacy.   Lab work:  NOT NEEDED  Testing/Procedures: WILL BE SCHEDULE AT [place] [facility] Your physician has requested that you have a carotid duplex. This test is an ultrasound of the carotid arteries in your neck. It looks at blood flow through these arteries that supply the brain with blood. Allow one hour for this exam. There are no restrictions or special instructions.  Follow-Up: At [facility], you and your health needs are our priority.  As part of our continuing mission to provide you with exceptional heart care, we have created designated Provider Care Teams.  These Care Teams include your primary Cardiologist (physician) and Advanced Practice Providers (APPs -  Physician Assistants and Nurse  Practitioners) who all work together to provide you with the care you need, when you need it. . You will need a follow up appointment in 12  months.  Please call our office 2 months in advance to schedule this appointment.  You may see Glenetta Hew, MD or one of the following Advanced Practice Providers on your designated Care Team:   . Rosaria Ferries, PA-C . Jory Sims, DNP, ANP- Your physician recommends that you schedule a follow-up appointment in 6 MONTHS .   Any Other Special Instructions Will Be Listed Below (If Applicable).   Studies Ordered:   Orders Placed This Encounter  Procedures  . EKG 12-Lead  . VAS US CAROTID      Glenetta Hew, M.D., M.S. Interventional Cardiologist   Pager # [phone] Phone # [phone] [address] [place], [place] [address]

## 2018-10-03 DIAGNOSIS — I10 Essential (primary) hypertension: Secondary | ICD-10-CM | POA: Diagnosis not present

## 2018-10-03 DIAGNOSIS — E785 Hyperlipidemia, unspecified: Secondary | ICD-10-CM | POA: Diagnosis not present

## 2018-10-08 ENCOUNTER — Encounter: Payer: Self-pay | Admitting: Cardiology

## 2018-10-08 NOTE — Assessment & Plan Note (Signed)
The plan was to allow for some mild permissive hypertension, but 170/60 is too high. Plan: Add amlodipine 2.5 mg daily.  ACE inhibitor was apparently stopped.  Would probably try to shoot for systolic pressure ranges in the 130 to 150 mmHg range.

## 2018-10-08 NOTE — Assessment & Plan Note (Addendum)
Last cath was in 2016.  Everything seemed patent.  EF relatively normal. Most of his chest discomfort symptoms seem to be musculoskeletal costochondral type pain.  Recommend treating with Tylenol, heating press, etc.  Is no longer on beta-blocker because of bradycardia.  ACE inhibitor was stopped because of hypotension. However now he is hypertensive and still has chest discomfort. Plan: Add low-dose amlodipine to existing dose of Imdur. Seems relatively euvolemic and therefore I have not placed him on a diuretic.[name]

## 2018-10-08 NOTE — Assessment & Plan Note (Signed)
Carotid Dopplers should be being followed by vascular.  Continue aggressive risk factor modification.

## 2018-10-08 NOTE — Assessment & Plan Note (Signed)
Grafts and stents look pretty stable last year.  Had relook cath about 4 years ago.  He is on high-dose statin (simvastatin) as well as Imdur and we are now adding amitriptyline.  No beta-blocker for issues of bradycardia and fatigue.  Remains on aspirin and Plavix.

## 2018-10-08 NOTE — Assessment & Plan Note (Signed)
Less symptomatic now that he is no longer on ACE inhibitor or beta-blocker.  Now unfortunately he is hypertensive.  I will start low-dose amlodipine in the evening.  Otherwise continue home meds.  Increase hydration.

## 2018-10-12 ENCOUNTER — Ambulatory Visit (HOSPITAL_COMMUNITY)
Admission: RE | Admit: 2018-10-12 | Discharge: 2018-10-12 | Disposition: A | Discharge status: Home / Self Care | Payer: Medicare Other | Source: Ambulatory Visit | Attending: Cardiology | Admitting: Cardiology

## 2018-10-12 ENCOUNTER — Other Ambulatory Visit: Payer: Self-pay

## 2018-10-12 ENCOUNTER — Other Ambulatory Visit (HOSPITAL_COMMUNITY): Payer: Self-pay | Admitting: Cardiology

## 2018-10-12 DIAGNOSIS — I739 Peripheral vascular disease, unspecified: Secondary | ICD-10-CM

## 2018-10-12 DIAGNOSIS — R0989 Other specified symptoms and signs involving the circulatory and respiratory systems: Secondary | ICD-10-CM | POA: Diagnosis not present

## 2018-10-12 DIAGNOSIS — I6523 Occlusion and stenosis of bilateral carotid arteries: Secondary | ICD-10-CM

## 2018-11-01 DIAGNOSIS — E1165 Type 2 diabetes mellitus with hyperglycemia: Secondary | ICD-10-CM | POA: Diagnosis not present

## 2018-11-01 DIAGNOSIS — I482 Chronic atrial fibrillation, unspecified: Secondary | ICD-10-CM | POA: Diagnosis not present

## 2018-11-01 DIAGNOSIS — E11649 Type 2 diabetes mellitus with hypoglycemia without coma: Secondary | ICD-10-CM | POA: Diagnosis not present

## 2018-11-01 DIAGNOSIS — Z6824 Body mass index (BMI) 24.0-24.9, adult: Secondary | ICD-10-CM | POA: Diagnosis not present

## 2018-11-01 DIAGNOSIS — I2510 Atherosclerotic heart disease of native coronary artery without angina pectoris: Secondary | ICD-10-CM | POA: Diagnosis not present

## 2018-11-01 DIAGNOSIS — E1140 Type 2 diabetes mellitus with diabetic neuropathy, unspecified: Secondary | ICD-10-CM | POA: Diagnosis not present

## 2018-11-20 ENCOUNTER — Encounter: Payer: Self-pay | Admitting: "Endocrinology

## 2018-11-20 ENCOUNTER — Encounter: Payer: Self-pay | Admitting: Nutrition

## 2018-11-20 ENCOUNTER — Ambulatory Visit: Payer: Medicare Other | Admitting: "Endocrinology

## 2018-11-20 ENCOUNTER — Encounter: Payer: Medicare Other | Attending: Family Medicine | Admitting: Nutrition

## 2018-11-20 ENCOUNTER — Other Ambulatory Visit: Payer: Self-pay

## 2018-11-20 VITALS — BP 142/66 | HR 76 | Ht 67.0 in | Wt 146.0 lb

## 2018-11-20 DIAGNOSIS — I10 Essential (primary) hypertension: Secondary | ICD-10-CM | POA: Insufficient documentation

## 2018-11-20 DIAGNOSIS — E785 Hyperlipidemia, unspecified: Secondary | ICD-10-CM | POA: Diagnosis not present

## 2018-11-20 DIAGNOSIS — I25119 Atherosclerotic heart disease of native coronary artery with unspecified angina pectoris: Secondary | ICD-10-CM | POA: Insufficient documentation

## 2018-11-20 DIAGNOSIS — E1165 Type 2 diabetes mellitus with hyperglycemia: Secondary | ICD-10-CM

## 2018-11-20 DIAGNOSIS — IMO0002 Reserved for concepts with insufficient information to code with codable children: Secondary | ICD-10-CM

## 2018-11-20 DIAGNOSIS — E118 Type 2 diabetes mellitus with unspecified complications: Secondary | ICD-10-CM | POA: Diagnosis not present

## 2018-11-20 MED ORDER — TRESIBA FLEXTOUCH 100 UNIT/ML ~~LOC~~ SOPN: 40.0000 [IU] | PEN_INJECTOR | Freq: Every day | SUBCUTANEOUS | 2 refills | Status: DC

## 2018-11-20 NOTE — Progress Notes (Signed)
MNT  Start 1400  Stop 1430   Walk in from Dr. Dorris Fetch. A1C 8.8%. Tresiba 40 units and 10 units of Novolog with meals Has a dexcom. BS tend to drop before lunch and sometimes at night. Meals inconsistency in [place] may be contributing to elevated blood sugars. Will answer safety question next visit. Education provided: My Plate, timing of meals, avoiding snacks unless BS is low. Goals of BS's. Avoidance of low blood sugars Taking medication as prescribed.  Only ate a banana for lunch today before his visit. BS was 135 mg/dl on his dexcom. Has juice in car.  Reviewed dangers of low blood sugars and treatment.  Goals Follow My Plate Eat three balanced meals Avoid snacks before meals unless blood sugar is low Increase fruit and vegetables with meals Eat a sandwich, fruit and/or yogurt for lunch Don't skip meals Prevent low blood sugars Take 40 units of Tresiba and 10 units of NOvolog before meals and use sliding scale.   Follow up next visit with DR. Dorris Fetch.

## 2018-11-20 NOTE — Patient Instructions (Signed)

## 2018-11-20 NOTE — Progress Notes (Signed)
Endocrinology Consult Note       11/20/2018, 5:19 PM   Subjective:    Patient ID: [name], male    DOB: 1932-12-03.  [name] is being seen in consultation for management of currently uncontrolled symptomatic diabetes requested by  Curlene Labrum, MD.   Past Medical History:  Diagnosis Date  . Asymptomatic stenosis of left vertebral artery 2002, 2005   Status post PTA/stent with redo; normal antegrade flow on dopplers 09/2013  . CAD (coronary artery disease) 1995   1CABG x3; redo in CABG x 1 2001 Free Radial to LAD; All grafts patent by Cath 08/2014: The proximal to mid LAD has poor retrograde filling from the LIMA due to in-stent restenosis. 50% ostial disease of free radial artery to the LAD.  [name] CAD (coronary artery disease) of artery bypass graft 2000   PCI x 2 - ostial & prox-mid LAD (BMS) for atretic LIMA-LAD  . CAD in native artery 1995   CABG x 3 - LIMA-LAD, SVG-OM2, SVG-rPDA  . Chest pain 09/2017  . DM (diabetes mellitus) type II controlled peripheral vascular disorder 2002   Left subclavian and left vertebral artery stenoses  . Glaucoma   . Hyperlipidemia LDL goal <70   . Hypertension, essential   . S/P CABG x 3 1995   . S/P Redo CABG x 1 2001   L Radial-LAD after 2 failed attempts @ LIMA-LAD PTCA; LAD stents 100% occluded  . Stenosis of left subclavian artery ([place]) 11/2003   Status post PTA/stent --> < 50 % stenosis by Dopplers 09/2013    Past Surgical History:  Procedure Laterality Date  . CARDIAC CATHETERIZATION N/A 08/04/2014   Procedure: Left Heart Cath and Cors/Grafts Angiography;  Surgeon: Leonie Man, MD;  Location: [facility];  Service: Cardiovascular: o-mRCA 70%, m-dRCA ~70% - SVG-dRCA Patent.  o-pLAD 100% stent occluded, mLAD 95% ISR with poor retrograde filling from freeRadiial graft-LAD (50% ostial graft dz).  o-p Cx 80%. Widely patent SVG-Cx-OM fills  retrograde to 80% lesion.; Normal LV Fxn & EDP  . CORONARY ANGIOPLASTY  April and May 2001   After Both LAD stents occluded - PTCA of anastomatic LIMA-LAD lesion -- Unsuccessful.  . CORONARY ARTERY BYPASS GRAFT  1995    LIMA-LAD, SVG-OM2, SVG-rPDA  . CORONARY ARTERY BYPASS GRAFT  June 2001   Dr. Lucianne Lei Trigt: Redo LAD grafting with free Left Radial-distal LAD  . CORONARY STENT PLACEMENT  1995-2000   2 BMS stents to osital-proximal & proximal-mid LAD; because of atretic LIMA-LDA  . LEFT HEART CATH AND CORONARY ANGIOGRAPHY N/A 09/18/2017   Procedure: LEFT HEART CATH AND CORONARY ANGIOGRAPHY;  Surgeon: Martinique, Peter M, MD;  Location: [facility];  Service: Cardiovascular;  Laterality: N/A;  . LEFT HEART CATHETERIZATION WITH CORONARY/GRAFT ANGIOGRAM N/A 08/22/2011   Procedure: LEFT HEART CATHETERIZATION WITH Beatrix Fetters;  Surgeon: Leonie Man, MD;  Location: [facility];  Service: Cardiovascular:  Known occluded LIMA-LAD and ostial LAD. Moderate to severe proximal circumflex and RCA disease. Widely patent freeLRAD-dLAD, as well as SVG-RPDA (backfilling RPL), SVG-OM 2 (backfilling OM1)  . SHOULDER  OPEN ROTATOR CUFF REPAIR  October 2004   Dr. Noemi Chapel  . SP Ninfa Meeker OR THOR CAROTID STENT Left October 2002; November 2005   Left Vertebral stent placed in October 2002 (Dr. Patrecia Pour); redo PCI in 2005  . SUBCLAVIAN ARTERY STENT Left November 2005   Dr. Gwenlyn Found    Social History   Socioeconomic History  . Marital status: Married    Spouse name: Not on file  . Number of children: Not on file  . Years of education: Not on file  . Highest education level: Not on file  Occupational History  . Not on file  Social Needs  . Financial resource strain: Not on file  . Food insecurity    Worry: Not on file    Inability: Not on file  . Transportation needs    Medical: Not on file    Non-medical: Not on file  Tobacco Use  . Smoking status: Former Smoker    Packs/day: 3.00     Years: 30.00    Pack years: 90.00    Types: Cigarettes  . Smokeless tobacco: Never Used  . Tobacco comment: quit smoking about 50 years ago  Substance and Sexual Activity  . Alcohol use: No  . Drug use: No  . Sexual activity: Not Currently  Lifestyle  . Physical activity    Days per week: Not on file    Minutes per session: Not on file  . Stress: Not on file  Relationships  . Social Herbalist on phone: Not on file    Gets together: Not on file    Attends religious service: Not on file    Active member of club or organization: Not on file    Attends meetings of clubs or organizations: Not on file    Relationship status: Not on file  Other Topics Concern  . Not on file  Social History Narrative   He is married with one daughter. He has 2 grandchildren.   He does not smoke. He quit smoking in roughly 1970 after smoking 3 packs per day.   He is routinely at give him. It does not necessarily do routine exercise.     Family History  Problem Relation Age of Onset  . Diabetes Mother   . Diabetes Sister   . Diabetes Brother     Outpatient Encounter Medications as of 11/20/2018  Medication Sig  . acetaminophen (TYLENOL) 500 MG tablet Take 1,000 mg by mouth every 6 (six) hours as needed for mild pain or headache.  [name] acyclovir (ZOVIRAX) 400 MG tablet Take 400 mg by mouth 2 (two) times daily with a meal.   . amLODipine (NORVASC) 2.5 MG tablet Take 1 tablet (2.5 mg total) by mouth daily.  [name] aspirin EC 81 MG tablet Take 81 mg by mouth daily with breakfast.   . Brinzolamide-Brimonidine (SIMBRINZA) 1-0.2 % SUSP Place 1 drop into both eyes 3 (three) times daily.  . Cholecalciferol (VITAMIN D) 2000 units tablet Take 2,000 Units by mouth daily with breakfast.  . clopidogrel (PLAVIX) 75 MG tablet Take 1 tablet (75 mg total) by mouth daily. (Patient taking differently: Take 75 mg by mouth daily with breakfast. )  . doxazosin (CARDURA) 8 MG tablet Take 8 mg by mouth at bedtime.   .  fluticasone (FLONASE) 50 MCG/ACT nasal spray Place 1 spray into both nostrils daily as needed (seasonal allergies).   . gabapentin (NEURONTIN) 300 MG capsule Take 600 mg by mouth See admin instructions. Take  two capsules (600 mg) by mouth three times daily - breakfast, supper and bedtime  . GLOBAL INJECT EASE LANCETS 30G MISC DIABETIC CLUB - USE TO CHECK BLOOD GLUCOSE LEVELS  . glucose blood test strip DIABETIC CLUB - USE TO CHECK BLOOD GLUCOSE LEVELS  . hydrocortisone cream 1 % Apply 1 application topically daily as needed for itching.  . Insulin Aspart (NOVOLOG FLEXPEN [place]) Inject 6-12 Units into the skin 3 (three) times daily before meals.  . insulin degludec (TRESIBA FLEXTOUCH) 100 UNIT/ML SOPN FlexTouch Pen Inject 0.4 mLs (40 Units total) into the skin daily.  . isosorbide mononitrate (IMDUR) 60 MG 24 hr tablet May take 1 and 1/2 tablets to 2 tablets a day depending on if chest discomfort is present  . latanoprost (XALATAN) 0.005 % ophthalmic solution Place 1 drop into the right eye at bedtime.   [name] loratadine (CLARITIN) 10 MG tablet Take 10 mg by mouth daily as needed (seasonal allergies).   . Omega-3 Fatty Acids (FISH OIL) 1000 MG CAPS Take 1,000 mg by mouth 2 (two) times daily with a meal.   . pantoprazole (PROTONIX) 40 MG tablet Take 40 mg by mouth daily with breakfast.  . prednisoLONE acetate (PRED FORTE) 1 % ophthalmic suspension Place 1 drop into the left eye 2 (two) times daily.  . simvastatin (ZOCOR) 80 MG tablet Take 40 mg by mouth at bedtime.   . timolol (TIMOPTIC) 0.5 % ophthalmic solution Place 1 drop into both eyes daily.   . vitamin C (ASCORBIC ACID) 500 MG tablet Take 500 mg by mouth 2 (two) times daily with a meal.   . [DISCONTINUED] Insulin Glargine (LANTUS SOLOSTAR) 100 UNIT/ML Solostar Pen Inject 26 Units into the skin at bedtime.    No facility-administered encounter medications on file as of 11/20/2018.     ALLERGIES: Allergies  Allergen Reactions  . Iohexol Other  (See Comments)    Intractable shaking.  . Contrast Media [Iodinated Diagnostic Agents]     Shivering & shaking. Pt reports takes antihistamine prior to procedures.  . Metformin And Related Diarrhea    Subsequently discontinued    VACCINATION STATUS: Immunization History  Administered Date(s) Administered  . Influenza, High Dose Seasonal PF 09/17/2017  . Influenza-Unspecified 09/03/2013    Diabetes He presents for his initial diabetic visit. He has type 2 diabetes mellitus. Onset time: He was diagnosed at approximate age of 60 years. His disease course has been worsening. Hypoglycemia symptoms include hunger, nervousness/anxiousness and sweats. Pertinent negatives for hypoglycemia include no confusion, headaches, pallor or seizures. Associated symptoms include polydipsia and polyuria. Pertinent negatives for diabetes include no chest pain, no fatigue, no polyphagia and no weakness. There are no hypoglycemic complications. Symptoms are worsening. Diabetic complications include heart disease. Risk factors for coronary artery disease include dyslipidemia, diabetes mellitus, family history, male sex, hypertension, sedentary lifestyle and tobacco exposure. Current diabetic treatment includes insulin injections (He is currently on Lantus 63 units nightly, and NovoLog 10 to 20 units in the morning). His weight is fluctuating minimally. He is following a generally unhealthy diet. When asked about meal planning, he reported none. He has not had a previous visit with a dietitian. He participates in exercise intermittently. (He has a Dexcom glucose sensor showing average blood glucose of 212 mg per DL.  And his recent A1c from September 07, 2018 was 8.8% increasing from 7.1% in the prior measurement. ) Eye exam is current.  Hyperlipidemia This is a chronic problem. The current episode started more than 1  year ago. The problem is controlled. Exacerbating diseases include diabetes. Pertinent negatives include no  chest pain, myalgias or shortness of breath. Current antihyperlipidemic treatment includes statins. Risk factors for coronary artery disease include dyslipidemia, diabetes mellitus, hypertension, male sex and a sedentary lifestyle.  Hypertension This is a chronic problem. The current episode started more than 1 year ago. Associated symptoms include sweats. Pertinent negatives include no chest pain, headaches, neck pain, palpitations or shortness of breath. Risk factors for coronary artery disease include family history, dyslipidemia, male gender, diabetes mellitus, sedentary lifestyle and smoking/tobacco exposure. Past treatments include direct vasodilators and calcium channel blockers. Hypertensive end-organ damage includes CAD/MI.     Review of Systems  Constitutional: Negative for chills, fatigue, fever and unexpected weight change.  HENT: Negative for dental problem, mouth sores and trouble swallowing.   Eyes: Negative for visual disturbance.  Respiratory: Negative for cough, choking, chest tightness, shortness of breath and wheezing.   Cardiovascular: Negative for chest pain, palpitations and leg swelling.  Gastrointestinal: Negative for abdominal distention, abdominal pain, constipation, diarrhea, nausea and vomiting.  Endocrine: Positive for polydipsia and polyuria. Negative for polyphagia.  Genitourinary: Negative for dysuria, flank pain, hematuria and urgency.  Musculoskeletal: Negative for back pain, gait problem, myalgias and neck pain.  Skin: Negative for pallor, rash and wound.  Neurological: Negative for seizures, syncope, weakness, numbness and headaches.  Psychiatric/Behavioral: Negative for confusion and dysphoric mood. The patient is nervous/anxious.     Objective:    BP (!) 142/66   Pulse 76   Ht 5\' 7"  (1.702 m)   Wt 146 lb (66.2 kg)   BMI 22.87 kg/m   Wt Readings from Last 3 Encounters:  11/20/18 146 lb (66.2 kg)  10/02/18 145 lb 12.8 oz (66.1 kg)  10/05/17 144 lb  3.2 oz (65.4 kg)     Physical Exam Constitutional:      General: He is not in acute distress.    Appearance: He is well-developed.  HENT:     Head: Normocephalic and atraumatic.  Neck:     Musculoskeletal: Normal range of motion and neck supple.     Thyroid: No thyromegaly.     Trachea: No tracheal deviation.  Cardiovascular:     Rate and Rhythm: Normal rate.     Pulses:          Dorsalis pedis pulses are 1+ on the right side and 1+ on the left side.       Posterior tibial pulses are 1+ on the right side and 1+ on the left side.     Heart sounds: S1 normal and S2 normal. No murmur. No gallop.   Pulmonary:     Effort: Pulmonary effort is normal. No respiratory distress.     Breath sounds: No wheezing.  Abdominal:     General: There is no distension.     Tenderness: There is no abdominal tenderness. There is no guarding.  Musculoskeletal:     Right shoulder: He exhibits no swelling and no deformity.  Skin:    General: Skin is warm and dry.     Findings: No rash.     Nails: There is no clubbing.   Neurological:     Mental Status: He is alert and oriented to person, place, and time.     Cranial Nerves: No cranial nerve deficit.     Sensory: No sensory deficit.     Gait: Gait normal.     Deep Tendon Reflexes: Reflexes are normal and symmetric.  Psychiatric:  Speech: Speech normal.        Behavior: Behavior normal. Behavior is cooperative.        Thought Content: Thought content normal.        Judgment: Judgment normal.       CMP ( most recent) CMP     Component Value Date/Time   NA 140 09/19/2017 0420   K 3.9 09/19/2017 0420   CL 107 09/19/2017 0420   CO2 21 (L) 09/19/2017 0420   GLUCOSE 234 (H) 09/19/2017 0420   BUN 18 09/19/2017 0420   CREATININE 1.13 09/19/2017 0420   CALCIUM 9.0 09/19/2017 0420   PROT 6.1 (L) 09/13/2014 1619   ALBUMIN 3.6 09/13/2014 1619   AST 24 09/13/2014 1619   ALT 21 09/13/2014 1619   ALKPHOS 30 (L) 09/13/2014 1619   BILITOT  0.6 09/13/2014 1619   GFRNONAA 58 (L) 09/19/2017 0420   GFRAA >60 09/19/2017 0420     Diabetic Labs (most recent): Lab Results  Component Value Date   HGBA1C 8.8 09/07/2018   HGBA1C 7.1 (H) 09/13/2014   HGBA1C 6.5 (H) 08/03/2014     Lipid Panel ( most recent) Lipid Panel     Component Value Date/Time   CHOL 131 09/18/2017 0543   TRIG 110 09/18/2017 0543   HDL 29 (L) 09/18/2017 0543   CHOLHDL 4.5 09/18/2017 0543   VLDL 22 09/18/2017 0543   LDLCALC 80 09/18/2017 0543      Lab Results  Component Value Date   TSH 1.466 08/02/2014    Assessment & Plan:   1. Uncontrolled type 2 diabetes mellitus with hyperglycemia (HCC)  - JAYKWON BORDNER has currently uncontrolled symptomatic type 2 DM since  85 years of age,  with most recent A1c of 8.8 %. Recent labs reviewed. - I had a long discussion with him about the progressive nature of diabetes and the pathology behind its complications. -his diabetes is complicated by coronary  artery disease and he remains at a high risk for more acute and chronic complications which include CAD, CVA, CKD, retinopathy, and neuropathy. These are all discussed in detail with him.  - I have counseled him on diet  and weight management  by adopting a carbohydrate restricted/protein rich diet. Patient is encouraged to switch to  unprocessed or minimally processed     complex starch and increased protein intake (animal or plant source), fruits, and vegetables. -  he is advised to stick to a routine mealtimes to eat 3 meals  a day and avoid unnecessary snacks ( to snack only to correct hypoglycemia).   - he admits that there is a room for improvement in his food and drink choices. - Suggestion is made for him to avoid simple carbohydrates  from his diet including Cakes, Sweet Desserts, Ice Cream, Soda (diet and regular), Sweet Tea, Candies, Chips, Cookies, Store Bought Juices, Alcohol in Excess of  1-2 drinks a day, Artificial Sweeteners,  Coffee Creamer,  and "Sugar-free" Products. This will help patient to have more stable blood glucose profile and potentially avoid unintended weight gain.  - he will be scheduled with Jearld Fenton, RDN, CDE for diabetes education.  - I have approached him with the following individualized plan to manage  his diabetes and patient agrees:   -Reportedly, patient is not tolerating any of the oral medications, and he wishes to avoid all of not insulin treatment options.  - Given his current glycemic burden, he will continue to require intensive treatment with basal/bolus insulin in  order for him to achieve and maintain control of diabetes to target. -Priority will be to avoid hypoglycemia in this patient.  -He will benefit from [place] in place of Lantus.  I discussed and initiated [place] at a lower dose of 40 units nightly, and readjust his NovoLog to 6 units 3 times a day with meals  for pre-meal BG readings of 90-150mg /dl, plus patient specific correction dose for unexpected hyperglycemia above 150mg /dl, associated with strict monitoring of glucose 4 times a day-before meals and at bedtime. - he is warned not to take insulin without proper monitoring per orders. - Adjustment parameters are given to him for hypo and hyperglycemia in writing. - he is encouraged to call clinic for blood glucose levels less than 70 or above 300 mg /dl. -He is not a candidate for SGLT2 inhibitors nor incretin therapy. - Specific targets for  A1c;  LDL, HDL, Triglycerides, and  Waist Circumference were discussed with the patient.  2) Blood Pressure /Hypertension:  his blood pressure is uncontrolled to target.   he is advised to continue his current medications including amlodipine 2.5 mg p.o. daily with breakfast .   3) Lipids/Hyperlipidemia:   Review of his recent lipid panel showed  controlled  LDL at 80 .  he  is advised to continue    simvastatin 40 mg daily at bedtime.  Side effects and precautions discussed with him.  4)   Weight/Diet:  Body mass index is 22.87 kg/m.  -    he is not a candidate for weight loss. Exercise, and detailed carbohydrates information provided  -  detailed on discharge instructions.  5) Chronic Care/Health Maintenance:  -he  is on  Statin medications and  is encouraged to initiate and continue to follow up with Ophthalmology, Dentist,  Podiatrist at least yearly or according to recommendations, and advised to  stay away from smoking. I have recommended yearly flu vaccine and pneumonia vaccine at least every 5 years; moderate intensity exercise for up to 150 minutes weekly; and  sleep for at least 7 hours a day.  - he is  advised to maintain close follow up with Burdine, Virgina Evener, MD for primary care needs, as well as his other providers for optimal and coordinated care.  - Time spent with the patient: 60 minutes, of which >50% was spent in obtaining information about his symptoms, reviewing his previous labs/studies, evaluations, and treatments, counseling him about his currently uncontrolled, complicated type 2 diabetes; hyperlipidemia; hypertension, and developing plans for long term treatment based on the latest standards of care/guidelines.  Please refer to " Patient Self Inventory" in the Media  tab for reviewed elements of pertinent patient history.  [name] participated in the discussions, expressed understanding, and voiced agreement with the above plans.  All questions were answered to his satisfaction. he is encouraged to contact clinic should he have any questions or concerns prior to his return visit.  Follow up plan: - Return in about 10 days (around 11/30/2018), or office, for Follow up with Meter and Logs Only - no Labs.  Glade Lloyd, MD [facility] [address] [place], [place] [address] Phone: [phone]  Fax: [phone]    11/20/2018, 5:19 PM  This note was partially dictated with voice recognition  software. Similar sounding words can be transcribed inadequately or may not  be corrected upon review.

## 2018-11-20 NOTE — Patient Instructions (Signed)
Goals Follow My Plate Eat three balanced meals Avoid snacks before meals unless blood sugar is low Increase fruit and vegetables with meals Eat a sandwich, fruit and/or yogurt for lunch Don't skip meals Prevent low blood sugars Take 40 units of Tresiba and 10 units of NOvolog before meals and use sliding scale.

## 2018-11-21 ENCOUNTER — Telehealth: Payer: Self-pay | Admitting: "Endocrinology

## 2018-11-21 NOTE — Telephone Encounter (Signed)
Pt.notified

## 2018-11-21 NOTE — Telephone Encounter (Signed)
Pt states that [facility] is too expensive at $189

## 2018-11-21 NOTE — Telephone Encounter (Signed)
He can finish the sample pen we gave him, get one more pen, for [place] to see the difference from lantus. He can hold off filling the tresiba rx for now.

## 2018-12-04 ENCOUNTER — Encounter: Payer: Self-pay | Admitting: Nutrition

## 2018-12-04 ENCOUNTER — Encounter: Payer: Self-pay | Admitting: "Endocrinology

## 2018-12-04 ENCOUNTER — Ambulatory Visit: Payer: Medicare Other | Admitting: "Endocrinology

## 2018-12-04 ENCOUNTER — Other Ambulatory Visit: Payer: Self-pay

## 2018-12-04 ENCOUNTER — Encounter: Payer: Medicare Other | Attending: Family Medicine | Admitting: Nutrition

## 2018-12-04 VITALS — BP 158/80 | HR 76 | Ht 67.0 in | Wt 144.0 lb

## 2018-12-04 VITALS — Ht 67.0 in | Wt 141.0 lb

## 2018-12-04 DIAGNOSIS — E118 Type 2 diabetes mellitus with unspecified complications: Secondary | ICD-10-CM | POA: Diagnosis not present

## 2018-12-04 DIAGNOSIS — I25119 Atherosclerotic heart disease of native coronary artery with unspecified angina pectoris: Secondary | ICD-10-CM | POA: Diagnosis not present

## 2018-12-04 DIAGNOSIS — E1165 Type 2 diabetes mellitus with hyperglycemia: Secondary | ICD-10-CM | POA: Diagnosis not present

## 2018-12-04 DIAGNOSIS — I10 Essential (primary) hypertension: Secondary | ICD-10-CM

## 2018-12-04 DIAGNOSIS — E782 Mixed hyperlipidemia: Secondary | ICD-10-CM | POA: Diagnosis not present

## 2018-12-04 DIAGNOSIS — IMO0002 Reserved for concepts with insufficient information to code with codable children: Secondary | ICD-10-CM

## 2018-12-04 DIAGNOSIS — E785 Hyperlipidemia, unspecified: Secondary | ICD-10-CM | POA: Insufficient documentation

## 2018-12-04 MED ORDER — TRESIBA FLEXTOUCH 100 UNIT/ML ~~LOC~~ SOPN: 30.0000 [IU] | PEN_INJECTOR | Freq: Every day | SUBCUTANEOUS | 2 refills | Status: DC

## 2018-12-04 NOTE — Progress Notes (Signed)
  Medical Nutrition Therapy:  Appt start time: 1400 end time:  1430.   Assessment:  Primary concerns today: Diabetes Follow up. Feels like he is doing much better. Tresiba 40 units and 6 units of Novolog with meals plus sliding scale.  He got confused on Novolog doses and has only been giving 1-2 units of Novolog with a meal if it was over 200 mg/dl. He hasn't been taking the  6 units with meals. He notes he has had a few low blood sugars in am of 40, 60's in spite of not taking Novolog in the middle of the night or early early am. Will see Dr. Dorris Fetch today.[name] Has a dexcom meter. .  For the last week .FBS: 120-150's  Had a low blood sugar of 69 before breakfast. BL) 141-175 BD) 191-211  Bedtime: 160-180's He has been eaitng fairly well. Meals more consistent and better balanceed. Drinking water.  Preferred Learning Style:   A  No preference indicated   Learning Readiness:   Ready  Change in progress   MEDICATIONS   DIETARY INTAKE:  B) Eggs, toast and applesauce. L) [place] sandwich, apple, water D) Chicken, sweet potato, peas, baked beans, water .    Usual physical activity: ADL   Estimated energy needs: 1800  calories 200 g carbohydrates 135 g protein 50 g fat  Progress Towards Goal(s):  In progress.   Nutritional Diagnosis:  NB-1.1 Food and nutrition-related knowledge deficit As related to Diabetes Type 2.  As evidenced by A1C 8.8%.    Intervention:  Nutrition and Diabetes education provided on My Plate, CHO counting, meal planning, portion sizes, timing of meals, avoiding snacks between meals unless having a low blood sugar, target ranges for A1C and blood sugars, signs/symptoms and treatment of hyper/hypoglycemia, monitoring blood sugars, taking medications as prescribed, benefits of exercising 30 minutes per day and prevention of complications of DM.  Goals  Keep up the great job. Let Dr. Dorris Fetch know about your lower blood sugars. Eat 3-4 carb choices per  meal Drink water Eat 1/2 pb sandwich and some milk before bed IF your Blood sugar is less than 100 mg/dl. Don't skip meals Use simple sugar source if BS is less than 70 to treat, wait 15 minutes and recheck BS. If lower than 80, drink 4-6 oz juice, soda or eat something sweet to bring it up and recheck in 15 minutes. Go to ER if still low after 2nd treatment. .Get A1C to 7% or less. Prevent hypoglycemia.  Teaching Method Utilized: \ Visual Auditory Hands on  Handouts given during visit include: Diabetes Instructions   Barriers to learning/adherence to lifestyle change: none  Demonstrated degree of understanding via:  Teach Back   Monitoring/Evaluation:  Dietary intake, exercise, , and body weight in 3 month(s)

## 2018-12-04 NOTE — Progress Notes (Signed)
12/04/2018, 4:18 PM  Endocrinology follow-up note   Subjective:    Patient ID: [name], male    DOB: Oct 24, 1932.  [name] is being seen in follow-up after he was seen in consultation for management of currently uncontrolled symptomatic diabetes requested by  Rory Percy, MD.   Past Medical History:  Diagnosis Date  . Asymptomatic stenosis of left vertebral artery 2002, 2005   Status post PTA/stent with redo; normal antegrade flow on dopplers 09/2013  . CAD (coronary artery disease) 1995   1CABG x3; redo in CABG x 1 2001 Free Radial to LAD; All grafts patent by Cath 08/2014: The proximal to mid LAD has poor retrograde filling from the LIMA due to in-stent restenosis. 50% ostial disease of free radial artery to the LAD.  [name] CAD (coronary artery disease) of artery bypass graft 2000   PCI x 2 - ostial & prox-mid LAD (BMS) for atretic LIMA-LAD  . CAD in native artery 1995   CABG x 3 - LIMA-LAD, SVG-OM2, SVG-rPDA  . Chest pain 09/2017  . DM (diabetes mellitus) type II controlled peripheral vascular disorder 2002   Left subclavian and left vertebral artery stenoses  . Glaucoma   . Hyperlipidemia LDL goal <70   . Hypertension, essential   . S/P CABG x 3 1995   . S/P Redo CABG x 1 2001   L Radial-LAD after 2 failed attempts @ LIMA-LAD PTCA; LAD stents 100% occluded  . Stenosis of left subclavian artery ([place]) 11/2003   Status post PTA/stent --> < 50 % stenosis by Dopplers 09/2013    Past Surgical History:  Procedure Laterality Date  . CARDIAC CATHETERIZATION N/A 08/04/2014   Procedure: Left Heart Cath and Cors/Grafts Angiography;  Surgeon: Leonie Man, MD;  Location: [facility];  Service: Cardiovascular: o-mRCA 70%, m-dRCA ~70% - SVG-dRCA Patent.  o-pLAD 100% stent occluded, mLAD 95% ISR with poor retrograde filling from freeRadiial graft-LAD (50% ostial graft dz).  o-p Cx 80%. Widely  patent SVG-Cx-OM fills retrograde to 80% lesion.; Normal LV Fxn & EDP  . CORONARY ANGIOPLASTY  April and May 2001   After Both LAD stents occluded - PTCA of anastomatic LIMA-LAD lesion -- Unsuccessful.  . CORONARY ARTERY BYPASS GRAFT  1995    LIMA-LAD, SVG-OM2, SVG-rPDA  . CORONARY ARTERY BYPASS GRAFT  June 2001   Dr. Lucianne Lei Trigt: Redo LAD grafting with free Left Radial-distal LAD  . CORONARY STENT PLACEMENT  1995-2000   2 BMS stents to osital-proximal & proximal-mid LAD; because of atretic LIMA-LDA  . LEFT HEART CATH AND CORONARY ANGIOGRAPHY N/A 09/18/2017   Procedure: LEFT HEART CATH AND CORONARY ANGIOGRAPHY;  Surgeon: Martinique, Peter M, MD;  Location: [facility];  Service: Cardiovascular;  Laterality: N/A;  . LEFT HEART CATHETERIZATION WITH CORONARY/GRAFT ANGIOGRAM N/A 08/22/2011   Procedure: LEFT HEART CATHETERIZATION WITH Beatrix Fetters;  Surgeon: Leonie Man, MD;  Location: [facility];  Service: Cardiovascular:  Known occluded LIMA-LAD and ostial LAD. Moderate to severe proximal circumflex and RCA disease. Widely patent freeLRAD-dLAD, as well as SVG-RPDA (backfilling RPL), SVG-OM 2 (backfilling OM1)  . SHOULDER OPEN ROTATOR CUFF  REPAIR  October 2004   Dr. Noemi Chapel  . SP Ninfa Meeker OR THOR CAROTID STENT Left October 2002; November 2005   Left Vertebral stent placed in October 2002 (Dr. Patrecia Pour); redo PCI in 2005  . SUBCLAVIAN ARTERY STENT Left November 2005   Dr. Gwenlyn Found    Social History   Socioeconomic History  . Marital status: Married    Spouse name: Not on file  . Number of children: Not on file  . Years of education: Not on file  . Highest education level: Not on file  Occupational History  . Not on file  Social Needs  . Financial resource strain: Not on file  . Food insecurity    Worry: Not on file    Inability: Not on file  . Transportation needs    Medical: Not on file    Non-medical: Not on file  Tobacco Use  . Smoking status: Former Smoker     Packs/day: 3.00    Years: 30.00    Pack years: 90.00    Types: Cigarettes  . Smokeless tobacco: Never Used  . Tobacco comment: quit smoking about 50 years ago  Substance and Sexual Activity  . Alcohol use: No  . Drug use: No  . Sexual activity: Not Currently  Lifestyle  . Physical activity    Days per week: Not on file    Minutes per session: Not on file  . Stress: Not on file  Relationships  . Social Herbalist on phone: Not on file    Gets together: Not on file    Attends religious service: Not on file    Active member of club or organization: Not on file    Attends meetings of clubs or organizations: Not on file    Relationship status: Not on file  Other Topics Concern  . Not on file  Social History Narrative   He is married with one daughter. He has 2 grandchildren.   He does not smoke. He quit smoking in roughly 1970 after smoking 3 packs per day.   He is routinely at give him. It does not necessarily do routine exercise.     Family History  Problem Relation Age of Onset  . Diabetes Mother   . Diabetes Sister   . Diabetes Brother     Outpatient Encounter Medications as of 12/04/2018  Medication Sig  . acetaminophen (TYLENOL) 500 MG tablet Take 1,000 mg by mouth every 6 (six) hours as needed for mild pain or headache.  [name] acyclovir (ZOVIRAX) 400 MG tablet Take 400 mg by mouth 2 (two) times daily with a meal.   . amLODipine (NORVASC) 2.5 MG tablet Take 1 tablet (2.5 mg total) by mouth daily.  [name] aspirin EC 81 MG tablet Take 81 mg by mouth daily with breakfast.   . Brinzolamide-Brimonidine (SIMBRINZA) 1-0.2 % SUSP Place 1 drop into both eyes 3 (three) times daily.  . Cholecalciferol (VITAMIN D) 2000 units tablet Take 2,000 Units by mouth daily with breakfast.  . clopidogrel (PLAVIX) 75 MG tablet Take 1 tablet (75 mg total) by mouth daily. (Patient taking differently: Take 75 mg by mouth daily with breakfast. )  . doxazosin (CARDURA) 8 MG tablet Take 8 mg by  mouth at bedtime.   . fluticasone (FLONASE) 50 MCG/ACT nasal spray Place 1 spray into both nostrils daily as needed (seasonal allergies).   . gabapentin (NEURONTIN) 300 MG capsule Take 600 mg by mouth See admin instructions. Take two capsules (600  mg) by mouth three times daily - breakfast, supper and bedtime  . GLOBAL INJECT EASE LANCETS 30G MISC DIABETIC CLUB - USE TO CHECK BLOOD GLUCOSE LEVELS  . glucose blood test strip DIABETIC CLUB - USE TO CHECK BLOOD GLUCOSE LEVELS  . hydrocortisone cream 1 % Apply 1 application topically daily as needed for itching.  . insulin degludec (TRESIBA FLEXTOUCH) 100 UNIT/ML SOPN FlexTouch Pen Inject 0.3 mLs (30 Units total) into the skin at bedtime.  . isosorbide mononitrate (IMDUR) 60 MG 24 hr tablet May take 1 and 1/2 tablets to 2 tablets a day depending on if chest discomfort is present  . latanoprost (XALATAN) 0.005 % ophthalmic solution Place 1 drop into the right eye at bedtime.   [name] loratadine (CLARITIN) 10 MG tablet Take 10 mg by mouth daily as needed (seasonal allergies).   . Omega-3 Fatty Acids (FISH OIL) 1000 MG CAPS Take 1,000 mg by mouth 2 (two) times daily with a meal.   . pantoprazole (PROTONIX) 40 MG tablet Take 40 mg by mouth daily with breakfast.  . prednisoLONE acetate (PRED FORTE) 1 % ophthalmic suspension Place 1 drop into the left eye 2 (two) times daily.  . simvastatin (ZOCOR) 80 MG tablet Take 40 mg by mouth at bedtime.   . timolol (TIMOPTIC) 0.5 % ophthalmic solution Place 1 drop into both eyes daily.   . vitamin C (ASCORBIC ACID) 500 MG tablet Take 500 mg by mouth 2 (two) times daily with a meal.   . [DISCONTINUED] Insulin Aspart (NOVOLOG FLEXPEN [place]) Inject 6-12 Units into the skin 3 (three) times daily before meals.  . [DISCONTINUED] insulin degludec (TRESIBA FLEXTOUCH) 100 UNIT/ML SOPN FlexTouch Pen Inject 0.4 mLs (40 Units total) into the skin daily.   No facility-administered encounter medications on file as of 12/04/2018.      ALLERGIES: Allergies  Allergen Reactions  . Iohexol Other (See Comments)    Intractable shaking.  . Contrast Media [Iodinated Diagnostic Agents]     Shivering & shaking. Pt reports takes antihistamine prior to procedures.  . Metformin And Related Diarrhea    Subsequently discontinued    VACCINATION STATUS: Immunization History  Administered Date(s) Administered  . Influenza, High Dose Seasonal PF 09/17/2017  . Influenza-Unspecified 09/03/2013    Diabetes He presents for his follow-up diabetic visit. He has type 2 diabetes mellitus. Onset time: He was diagnosed at approximate age of 89 years. His disease course has been improving. Pertinent negatives for hypoglycemia include no confusion, headaches, hunger, nervousness/anxiousness, pallor, seizures or sweats. Pertinent negatives for diabetes include no chest pain, no fatigue, no polydipsia, no polyphagia, no polyuria and no weakness. There are no hypoglycemic complications. Symptoms are improving. Diabetic complications include heart disease. Risk factors for coronary artery disease include dyslipidemia, diabetes mellitus, family history, male sex, hypertension, sedentary lifestyle and tobacco exposure. Current diabetic treatment includes insulin injections (He is currently on Lantus 63 units nightly, and NovoLog 10 to 20 units in the morning). He is following a generally unhealthy diet. When asked about meal planning, he reported none. He has not had a previous visit with a dietitian. He participates in exercise intermittently. His breakfast blood glucose range is generally 110-130 mg/dl. His lunch blood glucose range is generally 130-140 mg/dl. His dinner blood glucose range is generally 110-130 mg/dl. His bedtime blood glucose range is generally 130-140 mg/dl. His overall blood glucose range is 110-130 mg/dl. Eye exam is current.  Hyperlipidemia This is a chronic problem. The current episode started more than 1  year ago. The problem is  controlled. Exacerbating diseases include diabetes. Pertinent negatives include no chest pain, myalgias or shortness of breath. Current antihyperlipidemic treatment includes statins. Risk factors for coronary artery disease include dyslipidemia, diabetes mellitus, hypertension, male sex and a sedentary lifestyle.  Hypertension This is a chronic problem. The current episode started more than 1 year ago. Pertinent negatives include no chest pain, headaches, neck pain, palpitations, shortness of breath or sweats. Risk factors for coronary artery disease include family history, dyslipidemia, male gender, diabetes mellitus, sedentary lifestyle and smoking/tobacco exposure. Past treatments include direct vasodilators and calcium channel blockers. Hypertensive end-organ damage includes CAD/MI.     Review of Systems  Constitutional: Negative for chills, fatigue, fever and unexpected weight change.  HENT: Negative for dental problem, mouth sores and trouble swallowing.   Eyes: Negative for visual disturbance.  Respiratory: Negative for cough, choking, chest tightness, shortness of breath and wheezing.   Cardiovascular: Negative for chest pain, palpitations and leg swelling.  Gastrointestinal: Negative for abdominal distention, abdominal pain, constipation, diarrhea, nausea and vomiting.  Endocrine: Negative for polydipsia, polyphagia and polyuria.  Genitourinary: Negative for dysuria, flank pain, hematuria and urgency.  Musculoskeletal: Negative for back pain, gait problem, myalgias and neck pain.  Skin: Negative for pallor, rash and wound.  Neurological: Negative for seizures, syncope, weakness, numbness and headaches.  Psychiatric/Behavioral: Negative for confusion and dysphoric mood. The patient is not nervous/anxious.     Objective:    BP (!) 158/80   Pulse 76   Ht 5\' 7"  (1.702 m)   Wt 144 lb (65.3 kg)   BMI 22.55 kg/m   Wt Readings from Last 3 Encounters:  12/04/18 144 lb (65.3 kg)   12/04/18 141 lb (64 kg)  11/20/18 146 lb (66.2 kg)     Physical Exam Constitutional:      General: He is not in acute distress.    Appearance: He is well-developed.  HENT:     Head: Normocephalic and atraumatic.  Neck:     Musculoskeletal: Normal range of motion and neck supple.     Thyroid: No thyromegaly.     Trachea: No tracheal deviation.  Cardiovascular:     Rate and Rhythm: Normal rate.     Pulses:          Dorsalis pedis pulses are 1+ on the right side and 1+ on the left side.       Posterior tibial pulses are 1+ on the right side and 1+ on the left side.     Heart sounds: S1 normal and S2 normal. No murmur. No gallop.   Pulmonary:     Effort: Pulmonary effort is normal. No respiratory distress.     Breath sounds: No wheezing.  Abdominal:     General: There is no distension.     Tenderness: There is no abdominal tenderness. There is no guarding.  Musculoskeletal:     Right shoulder: He exhibits no swelling and no deformity.  Skin:    General: Skin is warm and dry.     Findings: No rash.     Nails: There is no clubbing.   Neurological:     Mental Status: He is alert and oriented to person, place, and time.     Cranial Nerves: No cranial nerve deficit.     Sensory: No sensory deficit.     Gait: Gait normal.     Deep Tendon Reflexes: Reflexes are normal and symmetric.  Psychiatric:        Speech:  Speech normal.        Behavior: Behavior normal. Behavior is cooperative.        Thought Content: Thought content normal.        Judgment: Judgment normal.       CMP ( most recent) CMP     Component Value Date/Time   NA 140 09/19/2017 0420   K 3.9 09/19/2017 0420   CL 107 09/19/2017 0420   CO2 21 (L) 09/19/2017 0420   GLUCOSE 234 (H) 09/19/2017 0420   BUN 18 09/19/2017 0420   CREATININE 1.13 09/19/2017 0420   CALCIUM 9.0 09/19/2017 0420   PROT 6.1 (L) 09/13/2014 1619   ALBUMIN 3.6 09/13/2014 1619   AST 24 09/13/2014 1619   ALT 21 09/13/2014 1619    ALKPHOS 30 (L) 09/13/2014 1619   BILITOT 0.6 09/13/2014 1619   GFRNONAA 58 (L) 09/19/2017 0420   GFRAA >60 09/19/2017 0420     Diabetic Labs (most recent): Lab Results  Component Value Date   HGBA1C 8.8 09/07/2018   HGBA1C 7.1 (H) 09/13/2014   HGBA1C 6.5 (H) 08/03/2014     Lipid Panel ( most recent) Lipid Panel     Component Value Date/Time   CHOL 131 09/18/2017 0543   TRIG 110 09/18/2017 0543   HDL 29 (L) 09/18/2017 0543   CHOLHDL 4.5 09/18/2017 0543   VLDL 22 09/18/2017 0543   LDLCALC 80 09/18/2017 0543      Lab Results  Component Value Date   TSH 1.466 08/02/2014    Assessment & Plan:   1. Uncontrolled type 2 diabetes mellitus with hyperglycemia (HCC)  - [name] has currently uncontrolled symptomatic type 2 DM since  85 years of age,  with most recent A1c of 8.8 %. Recent labs reviewed. - he came with tightly controlled glycemic profile both fasting and postprandial.  -He did not use NovoLog at all.  -his diabetes is complicated by coronary  artery disease and he remains at a high risk for more acute and chronic complications which include CAD, CVA, CKD, retinopathy, and neuropathy. These are all discussed in detail with him.  - I have counseled him on diet  and weight management  by adopting a carbohydrate restricted/protein rich diet. Patient is encouraged to switch to  unprocessed or minimally processed     complex starch and increased protein intake (animal or plant source), fruits, and vegetables. -  he is advised to stick to a routine mealtimes to eat 3 meals  a day and avoid unnecessary snacks ( to snack only to correct hypoglycemia).   - he  admits there is a room for improvement in his diet and drink choices. -  Suggestion is made for him to avoid simple carbohydrates  from his diet including Cakes, Sweet Desserts / Pastries, Ice Cream, Soda (diet and regular), Sweet Tea, Candies, Chips, Cookies, Sweet Pastries,  Store Bought Juices, Alcohol in Excess  of  1-2 drinks a day, Artificial Sweeteners, Coffee Creamer, and "Sugar-free" Products. This will help patient to have stable blood glucose profile and potentially avoid unintended weight gain.   - he will be scheduled with [name], [name], [name] for diabetes education.  - I have approached him with the following individualized plan to manage  his diabetes and patient agrees:   -Reportedly, patient is not tolerating any of the oral medications, and he wishes to avoid all of not insulin treatment options.  -Given his presentation with tightly controlled glycemic profile, he is advised to  lower his [name] to 30 units nightly, hold NovoLog for now.   -He is encouraged to continue to wear his CGM device at all times.  - he is encouraged to call clinic for blood glucose levels less than 70 or above 200 mg /dl. -He is not a candidate for SGLT2 inhibitors nor incretin therapy. - Specific targets for  A1c;  LDL, HDL, Triglycerides, and  Waist Circumference were discussed with the patient.  2) Blood Pressure /Hypertension:  his blood pressure is uncontrolled to target.   he is advised to continue his current medications including amlodipine 2.5 mg p.o. daily with breakfast .   3) Lipids/Hyperlipidemia:   Review of his recent lipid panel showed  controlled  LDL at 80 .  he  is advised to continue simvastatin 40 mg daily at bedtime.  Side effects and precautions discussed with him.  4)  Weight/Diet:  Body mass index is 22.55 kg/m.  -    he is not a candidate for weight loss. Exercise, and detailed carbohydrates information provided  -  detailed on discharge instructions.  5) Chronic Care/Health Maintenance:  -he  is on  Statin medications and  is encouraged to initiate and continue to follow up with Ophthalmology, Dentist,  Podiatrist at least yearly or according to recommendations, and advised to  stay away from smoking. I have recommended yearly flu vaccine and pneumonia vaccine at least every 5  years; moderate intensity exercise for up to 150 minutes weekly; and  sleep for at least 7 hours a day.  - he is  advised to maintain close follow up with Rory Percy, MD for primary care needs, as well as his other providers for optimal and coordinated care.  - Time spent with the patient: 25 min, of which >50% was spent in reviewing his blood glucose logs , discussing his hypoglycemia and hyperglycemia episodes, reviewing his current and  previous labs / studies and medications  doses and developing a plan to avoid hypoglycemia and hyperglycemia. Please refer to Patient Instructions for Blood Glucose Monitoring and Insulin/Medications Dosing Guide"  in media tab for additional information. Please  also refer to " Patient Self Inventory" in the Media  tab for reviewed elements of pertinent patient history.  [name] participated in the discussions, expressed understanding, and voiced agreement with the above plans.  All questions were answered to his satisfaction. he is encouraged to contact clinic should he have any questions or concerns prior to his return visit.   Follow up plan: - Return in about 9 weeks (around 02/05/2019) for Bring Meter and Logs- A1c in Office.  Glade Lloyd, MD [facility] [address] [place], [place] [address] Phone: [phone]  Fax: [phone]    12/04/2018, 4:18 PM  This note was partially dictated with voice recognition software. Similar sounding words can be transcribed inadequately or may not  be corrected upon review.

## 2018-12-04 NOTE — Patient Instructions (Signed)
Goals  Keep up the great job. Let Dr. Dorris Fetch know about your lower blood sugars. Eat 3-4 carb choices per meal Drink water Eat 1/2 pb sandwich and some milk before bed IF your Blood sugar is less than 100 mg/dl. Don't skip meals Use simple sugar source if BS is less than 70 to treat, wait 15 minutes and recheck BS. If lower than 80, drink 4-6 oz juice, soda or eat something sweet to bring it up and recheck in 15 minutes. Go to ER if still low after 2nd treatment. .Get A1C to 7% or less. Prevent hypoglycemia.

## 2019-01-03 DIAGNOSIS — E7800 Pure hypercholesterolemia, unspecified: Secondary | ICD-10-CM | POA: Diagnosis not present

## 2019-01-03 DIAGNOSIS — I10 Essential (primary) hypertension: Secondary | ICD-10-CM | POA: Diagnosis not present

## 2019-01-08 DIAGNOSIS — J440 Chronic obstructive pulmonary disease with acute lower respiratory infection: Secondary | ICD-10-CM | POA: Diagnosis not present

## 2019-01-08 DIAGNOSIS — Z20822 Contact with and (suspected) exposure to covid-19: Secondary | ICD-10-CM | POA: Diagnosis not present

## 2019-02-01 DIAGNOSIS — E1165 Type 2 diabetes mellitus with hyperglycemia: Secondary | ICD-10-CM | POA: Diagnosis not present

## 2019-02-01 DIAGNOSIS — I10 Essential (primary) hypertension: Secondary | ICD-10-CM | POA: Diagnosis not present

## 2019-02-06 ENCOUNTER — Other Ambulatory Visit: Payer: Self-pay

## 2019-02-06 ENCOUNTER — Encounter: Payer: Self-pay | Admitting: "Endocrinology

## 2019-02-06 ENCOUNTER — Encounter: Payer: Medicare Other | Attending: "Endocrinology | Admitting: Nutrition

## 2019-02-06 ENCOUNTER — Ambulatory Visit: Payer: Medicare Other | Admitting: "Endocrinology

## 2019-02-06 ENCOUNTER — Encounter: Payer: Self-pay | Admitting: Nutrition

## 2019-02-06 VITALS — BP 148/71 | HR 65 | Ht 67.0 in | Wt 146.0 lb

## 2019-02-06 VITALS — Ht 67.0 in | Wt 146.0 lb

## 2019-02-06 DIAGNOSIS — E118 Type 2 diabetes mellitus with unspecified complications: Secondary | ICD-10-CM | POA: Diagnosis not present

## 2019-02-06 DIAGNOSIS — E782 Mixed hyperlipidemia: Secondary | ICD-10-CM

## 2019-02-06 DIAGNOSIS — E1165 Type 2 diabetes mellitus with hyperglycemia: Secondary | ICD-10-CM | POA: Diagnosis not present

## 2019-02-06 DIAGNOSIS — IMO0002 Reserved for concepts with insufficient information to code with codable children: Secondary | ICD-10-CM

## 2019-02-06 DIAGNOSIS — I10 Essential (primary) hypertension: Secondary | ICD-10-CM | POA: Insufficient documentation

## 2019-02-06 DIAGNOSIS — E785 Hyperlipidemia, unspecified: Secondary | ICD-10-CM | POA: Diagnosis not present

## 2019-02-06 LAB — POCT GLYCOSYLATED HEMOGLOBIN (HGB A1C): Hemoglobin A1C: 7.1 % — AB (ref 4.0–5.6)

## 2019-02-06 MED ORDER — TRESIBA FLEXTOUCH 100 UNIT/ML ~~LOC~~ SOPN: 40.0000 [IU] | PEN_INJECTOR | Freq: Every day | SUBCUTANEOUS | 2 refills | Status: DC

## 2019-02-06 NOTE — Progress Notes (Signed)
  Medical Nutrition Therapy:  Appt start time: 1325 end time:  1350    Assessment:  Primary concerns today: Diabetes Follow up.   FBS 93-160 before meals bedtime 170's. Lantus 40 units still and no longer doing meal time insulin. Saw Dr. Dorris Fetch today. A1C down to 7.1% now from 8.8%.  Eating much better. Still skips meals- lunch on occasion . Sleeps in late and combines breakfast and lunch sometimes. Drinking water.  Lab Results  Component Value Date   HGBA1C 7.1 (A) 02/06/2019   CMP Latest Ref Rng & Units 09/19/2017 09/18/2017 10/16/2015  Glucose 70 - 99 mg/dL 234(H) 158(H) 461(H)  BUN 8 - 23 mg/dL 18 14 23(H)  Creatinine 0.61 - 1.24 mg/dL 1.13 1.06 1.27(H)  Sodium 135 - 145 mmol/L 140 139 130(L)  Potassium 3.5 - 5.1 mmol/L 3.9 3.8 4.2  Chloride 98 - 111 mmol/L 107 107 96(L)  CO2 22 - 32 mmol/L 21(L) 21(L) 26  Calcium 8.9 - 10.3 mg/dL 9.0 8.8(L) 9.1  Total Protein 6.5 - 8.1 g/dL - - -  Total Bilirubin 0.3 - 1.2 mg/dL - - -  Alkaline Phos 38 - 126 U/L - - -  AST 15 - 41 U/L - - -  ALT 17 - 63 U/L - - -     Preferred Learning Style:   A  No preference indicated   Learning Readiness:   Ready  Change in progress   MEDICATIONS   DIETARY INTAKE:  B) Eggs, toast, applesauce L)   D) Chicken, sweet potato, peas, baked beans, water .    Usual physical activity: ADL   Estimated energy needs: 1800  calories 200 g carbohydrates 135 g protein 50 g fat  Progress Towards Goal(s):  In progress.   Nutritional Diagnosis:  NB-1.1 Food and nutrition-related knowledge deficit As related to Diabetes Type 2.  As evidenced by A1C 8.8%.    Intervention:  Nutrition and Diabetes education provided on My Plate, CHO counting, meal planning, portion sizes, timing of meals, avoiding snacks between meals unless having a low blood sugar, target ranges for A1C and blood sugars, signs/symptoms and treatment of hyper/hypoglycemia, monitoring blood sugars, taking medications as prescribed,  benefits of exercising 30 minutes per day and prevention of complications of DM.  Goals  Keep up the great job. Let Dr. Dorris Fetch know about your lower blood sugars. Eat 3-4 carb choices per meal Eat 1/2 pb sandwich and some milk before bed IF your Blood sugar is less than 100 mg/dl. Don't skip meals Use simple sugar source if BS is less than 70 to treat, wait 15 minutes and recheck BS. If lower than 80, drink 4-6 oz juice, soda or eat something sweet to bring it up and recheck in 15 minutes. Go to ER if still low after 2nd treatment. .Get A1C to 7% or less. Prevent hypoglycemia.  Teaching Method Utilized: \ Visual Auditory Hands on  Handouts given during visit include: Diabetes Instructions   Barriers to learning/adherence to lifestyle change: none  Demonstrated degree of understanding via:  Teach Back   Monitoring/Evaluation:  Dietary intake, exercise, , and body weight in 3 month(s)

## 2019-02-06 NOTE — Progress Notes (Signed)
02/06/2019, 4:41 PM  Endocrinology follow-up note   Subjective:    Patient ID: [name], male    DOB: May 26, 1932.  [name] is being seen in follow-up after he was seen in consultation for management of currently uncontrolled symptomatic diabetes requested by  Rory Percy, MD.   Past Medical History:  Diagnosis Date  . Asymptomatic stenosis of left vertebral artery 2002, 2005   Status post PTA/stent with redo; normal antegrade flow on dopplers 09/2013  . CAD (coronary artery disease) 1995   1CABG x3; redo in CABG x 1 2001 Free Radial to LAD; All grafts patent by Cath 08/2014: The proximal to mid LAD has poor retrograde filling from the LIMA due to in-stent restenosis. 50% ostial disease of free radial artery to the LAD.  [name] CAD (coronary artery disease) of artery bypass graft 2000   PCI x 2 - ostial & prox-mid LAD (BMS) for atretic LIMA-LAD  . CAD in native artery 1995   CABG x 3 - LIMA-LAD, SVG-OM2, SVG-rPDA  . Chest pain 09/2017  . DM (diabetes mellitus) type II controlled peripheral vascular disorder 2002   Left subclavian and left vertebral artery stenoses  . Glaucoma   . Hyperlipidemia LDL goal <70   . Hypertension, essential   . S/P CABG x 3 1995   . S/P Redo CABG x 1 2001   L Radial-LAD after 2 failed attempts @ LIMA-LAD PTCA; LAD stents 100% occluded  . Stenosis of left subclavian artery ([place]) 11/2003   Status post PTA/stent --> < 50 % stenosis by Dopplers 09/2013    Past Surgical History:  Procedure Laterality Date  . CARDIAC CATHETERIZATION N/A 08/04/2014   Procedure: Left Heart Cath and Cors/Grafts Angiography;  Surgeon: Leonie Man, MD;  Location: [place] [facility];  Service: Cardiovascular: o-mRCA 70%, m-dRCA ~70% - SVG-dRCA Patent.  o-pLAD 100% stent occluded, mLAD 95% ISR with poor retrograde filling from freeRadiial graft-LAD (50% ostial graft dz).  o-p Cx 80%. Widely  patent SVG-Cx-OM fills retrograde to 80% lesion.; Normal LV Fxn & EDP  . CORONARY ANGIOPLASTY  April and May 2001   After Both LAD stents occluded - PTCA of anastomatic LIMA-LAD lesion -- Unsuccessful.  . CORONARY ARTERY BYPASS GRAFT  1995    LIMA-LAD, SVG-OM2, SVG-rPDA  . CORONARY ARTERY BYPASS GRAFT  June 2001   Dr. Lucianne Lei Trigt: Redo LAD grafting with free Left Radial-distal LAD  . CORONARY STENT PLACEMENT  1995-2000   2 BMS stents to osital-proximal & proximal-mid LAD; because of atretic LIMA-LDA  . LEFT HEART CATH AND CORONARY ANGIOGRAPHY N/A 09/18/2017   Procedure: LEFT HEART CATH AND CORONARY ANGIOGRAPHY;  Surgeon: Martinique, Peter M, MD;  Location: [facility];  Service: Cardiovascular;  Laterality: N/A;  . LEFT HEART CATHETERIZATION WITH CORONARY/GRAFT ANGIOGRAM N/A 08/22/2011   Procedure: LEFT HEART CATHETERIZATION WITH Beatrix Fetters;  Surgeon: Leonie Man, MD;  Location: [facility];  Service: Cardiovascular:  Known occluded LIMA-LAD and ostial LAD. Moderate to severe proximal circumflex and RCA disease. Widely patent freeLRAD-dLAD, as well as SVG-RPDA (backfilling RPL), SVG-OM 2 (backfilling OM1)  . SHOULDER OPEN ROTATOR CUFF  REPAIR  October 2004   Dr. Noemi Chapel  . SP Ninfa Meeker OR THOR CAROTID STENT Left October 2002; November 2005   Left Vertebral stent placed in October 2002 (Dr. Patrecia Pour); redo PCI in 2005  . SUBCLAVIAN ARTERY STENT Left November 2005   Dr. Gwenlyn Found    Social History   Socioeconomic History  . Marital status: Married    Spouse name: Not on file  . Number of children: Not on file  . Years of education: Not on file  . Highest education level: Not on file  Occupational History  . Not on file  Tobacco Use  . Smoking status: Former Smoker    Packs/day: 3.00    Years: 30.00    Pack years: 90.00    Types: Cigarettes  . Smokeless tobacco: Never Used  . Tobacco comment: quit smoking about 50 years ago  Substance and Sexual Activity  .  Alcohol use: No  . Drug use: No  . Sexual activity: Not Currently  Other Topics Concern  . Not on file  Social History Narrative   He is married with one daughter. He has 2 grandchildren.   He does not smoke. He quit smoking in roughly 1970 after smoking 3 packs per day.   He is routinely at give him. It does not necessarily do routine exercise.    Social Determinants of Health   Financial Resource Strain:   . Difficulty of Paying Living Expenses: Not on file  Food Insecurity:   . Worried About Charity fundraiser in the Last Year: Not on file  . Ran Out of Food in the Last Year: Not on file  Transportation Needs:   . Lack of Transportation (Medical): Not on file  . Lack of Transportation (Non-Medical): Not on file  Physical Activity:   . Days of Exercise per Week: Not on file  . Minutes of Exercise per Session: Not on file  Stress:   . Feeling of Stress : Not on file  Social Connections:   . Frequency of Communication with Friends and Family: Not on file  . Frequency of Social Gatherings with Friends and Family: Not on file  . Attends Religious Services: Not on file  . Active Member of Clubs or Organizations: Not on file  . Attends Archivist Meetings: Not on file  . Marital Status: Not on file    Family History  Problem Relation Age of Onset  . Diabetes Mother   . Diabetes Sister   . Diabetes Brother     Outpatient Encounter Medications as of 02/06/2019  Medication Sig  . acetaminophen (TYLENOL) 500 MG tablet Take 1,000 mg by mouth every 6 (six) hours as needed for mild pain or headache.  [name] acyclovir (ZOVIRAX) 400 MG tablet Take 400 mg by mouth 2 (two) times daily with a meal.   . amLODipine (NORVASC) 2.5 MG tablet Take 1 tablet (2.5 mg total) by mouth daily.  [name] aspirin EC 81 MG tablet Take 81 mg by mouth daily with breakfast.   . Brinzolamide-Brimonidine (SIMBRINZA) 1-0.2 % SUSP Place 1 drop into both eyes 3 (three) times daily.  . Cholecalciferol (VITAMIN  D) 2000 units tablet Take 2,000 Units by mouth daily with breakfast.  . clopidogrel (PLAVIX) 75 MG tablet Take 1 tablet (75 mg total) by mouth daily. (Patient taking differently: Take 75 mg by mouth daily with breakfast. )  . doxazosin (CARDURA) 8 MG tablet Take 8 mg by mouth at bedtime.   . fluticasone (  FLONASE) 50 MCG/ACT nasal spray Place 1 spray into both nostrils daily as needed (seasonal allergies).   . gabapentin (NEURONTIN) 300 MG capsule Take 600 mg by mouth See admin instructions. Take two capsules (600 mg) by mouth three times daily - breakfast, supper and bedtime  . GLOBAL INJECT EASE LANCETS 30G MISC DIABETIC CLUB - USE TO CHECK BLOOD GLUCOSE LEVELS  . glucose blood test strip DIABETIC CLUB - USE TO CHECK BLOOD GLUCOSE LEVELS  . hydrocortisone cream 1 % Apply 1 application topically daily as needed for itching.  . insulin degludec (TRESIBA FLEXTOUCH) 100 UNIT/ML SOPN FlexTouch Pen Inject 0.4 mLs (40 Units total) into the skin at bedtime.  . isosorbide mononitrate (IMDUR) 60 MG 24 hr tablet May take 1 and 1/2 tablets to 2 tablets a day depending on if chest discomfort is present  . latanoprost (XALATAN) 0.005 % ophthalmic solution Place 1 drop into the right eye at bedtime.   [name] loratadine (CLARITIN) 10 MG tablet Take 10 mg by mouth daily as needed (seasonal allergies).   . Omega-3 Fatty Acids (FISH OIL) 1000 MG CAPS Take 1,000 mg by mouth 2 (two) times daily with a meal.   . pantoprazole (PROTONIX) 40 MG tablet Take 40 mg by mouth daily with breakfast.  . prednisoLONE acetate (PRED FORTE) 1 % ophthalmic suspension Place 1 drop into the left eye 2 (two) times daily.  . simvastatin (ZOCOR) 80 MG tablet Take 40 mg by mouth at bedtime.   . timolol (TIMOPTIC) 0.5 % ophthalmic solution Place 1 drop into both eyes daily.   . vitamin C (ASCORBIC ACID) 500 MG tablet Take 500 mg by mouth 2 (two) times daily with a meal.   . [DISCONTINUED] insulin degludec (TRESIBA FLEXTOUCH) 100 UNIT/ML SOPN  FlexTouch Pen Inject 0.3 mLs (30 Units total) into the skin at bedtime.   No facility-administered encounter medications on file as of 02/06/2019.    ALLERGIES: Allergies  Allergen Reactions  . Iohexol Other (See Comments)    Intractable shaking.  . Contrast Media [Iodinated Diagnostic Agents]     Shivering & shaking. Pt reports takes antihistamine prior to procedures.  . Metformin And Related Diarrhea    Subsequently discontinued    VACCINATION STATUS: Immunization History  Administered Date(s) Administered  . Influenza, High Dose Seasonal PF 09/17/2017  . Influenza-Unspecified 09/03/2013    Diabetes He presents for his follow-up diabetic visit. He has type 2 diabetes mellitus. Onset time: He was diagnosed at approximate age of 71 years. His disease course has been improving. Pertinent negatives for hypoglycemia include no confusion, headaches, hunger, nervousness/anxiousness, pallor, seizures or sweats. Pertinent negatives for diabetes include no chest pain, no fatigue, no polydipsia, no polyphagia, no polyuria and no weakness. There are no hypoglycemic complications. Symptoms are improving. Diabetic complications include heart disease. Risk factors for coronary artery disease include dyslipidemia, diabetes mellitus, family history, male sex, hypertension, sedentary lifestyle and tobacco exposure. Current diabetic treatment includes insulin injections (He is currently on Lantus 63 units nightly, and NovoLog 10 to 20 units in the morning). He is following a generally unhealthy diet. When asked about meal planning, he reported none. He has not had a previous visit with a dietitian. He participates in exercise intermittently. His breakfast blood glucose range is generally 110-130 mg/dl. His lunch blood glucose range is generally 130-140 mg/dl. His dinner blood glucose range is generally 110-130 mg/dl. His bedtime blood glucose range is generally 130-140 mg/dl. His overall blood glucose range is  110-130 mg/dl. Eye  exam is current.  Hyperlipidemia This is a chronic problem. The current episode started more than 1 year ago. The problem is controlled. Exacerbating diseases include diabetes. Pertinent negatives include no chest pain, myalgias or shortness of breath. Current antihyperlipidemic treatment includes statins. Risk factors for coronary artery disease include dyslipidemia, diabetes mellitus, hypertension, male sex and a sedentary lifestyle.  Hypertension This is a chronic problem. The current episode started more than 1 year ago. Pertinent negatives include no chest pain, headaches, neck pain, palpitations, shortness of breath or sweats. Risk factors for coronary artery disease include family history, dyslipidemia, male gender, diabetes mellitus, sedentary lifestyle and smoking/tobacco exposure. Past treatments include direct vasodilators and calcium channel blockers. Hypertensive end-organ damage includes CAD/MI.     Review of Systems  Constitutional: Negative for chills, fatigue, fever and unexpected weight change.  HENT: Negative for dental problem, mouth sores and trouble swallowing.   Eyes: Negative for visual disturbance.  Respiratory: Negative for cough, choking, chest tightness, shortness of breath and wheezing.   Cardiovascular: Negative for chest pain, palpitations and leg swelling.  Gastrointestinal: Negative for abdominal distention, abdominal pain, constipation, diarrhea, nausea and vomiting.  Endocrine: Negative for polydipsia, polyphagia and polyuria.  Genitourinary: Negative for dysuria, flank pain, hematuria and urgency.  Musculoskeletal: Negative for back pain, gait problem, myalgias and neck pain.  Skin: Negative for pallor, rash and wound.  Neurological: Negative for seizures, syncope, weakness, numbness and headaches.  Psychiatric/Behavioral: Negative for confusion and dysphoric mood. The patient is not nervous/anxious.     Objective:    BP (!) 148/71    Pulse 65   Ht 5\' 7"  (1.702 m)   Wt 146 lb (66.2 kg)   BMI 22.87 kg/m   Wt Readings from Last 3 Encounters:  02/06/19 146 lb (66.2 kg)  02/06/19 146 lb (66.2 kg)  12/04/18 144 lb (65.3 kg)     Physical Exam Constitutional:      General: He is not in acute distress.    Appearance: He is well-developed.  HENT:     Head: Normocephalic and atraumatic.  Neck:     Thyroid: No thyromegaly.     Trachea: No tracheal deviation.  Cardiovascular:     Rate and Rhythm: Normal rate.     Pulses:          Dorsalis pedis pulses are 1+ on the right side and 1+ on the left side.       Posterior tibial pulses are 1+ on the right side and 1+ on the left side.     Heart sounds: S1 normal and S2 normal. No murmur. No gallop.   Pulmonary:     Effort: Pulmonary effort is normal. No respiratory distress.     Breath sounds: No wheezing.  Abdominal:     General: There is no distension.     Tenderness: There is no abdominal tenderness. There is no guarding.  Musculoskeletal:     Right shoulder: No swelling or deformity.     Cervical back: Normal range of motion and neck supple.  Skin:    General: Skin is warm and dry.     Findings: No rash.     Nails: There is no clubbing.  Neurological:     Mental Status: He is alert and oriented to person, place, and time.     Cranial Nerves: No cranial nerve deficit.     Sensory: No sensory deficit.     Gait: Gait normal.     Deep Tendon Reflexes: Reflexes are  normal and symmetric.  Psychiatric:        Speech: Speech normal.        Behavior: Behavior normal. Behavior is cooperative.        Thought Content: Thought content normal.        Judgment: Judgment normal.       CMP ( most recent) CMP     Component Value Date/Time   NA 140 09/19/2017 0420   K 3.9 09/19/2017 0420   CL 107 09/19/2017 0420   CO2 21 (L) 09/19/2017 0420   GLUCOSE 234 (H) 09/19/2017 0420   BUN 18 09/19/2017 0420   CREATININE 1.13 09/19/2017 0420   CALCIUM 9.0 09/19/2017 0420    PROT 6.1 (L) 09/13/2014 1619   ALBUMIN 3.6 09/13/2014 1619   AST 24 09/13/2014 1619   ALT 21 09/13/2014 1619   ALKPHOS 30 (L) 09/13/2014 1619   BILITOT 0.6 09/13/2014 1619   GFRNONAA 58 (L) 09/19/2017 0420   GFRAA >60 09/19/2017 0420     Diabetic Labs (most recent): Lab Results  Component Value Date   HGBA1C 7.1 (A) 02/06/2019   HGBA1C 8.8 09/07/2018   HGBA1C 7.1 (H) 09/13/2014     Lipid Panel ( most recent) Lipid Panel     Component Value Date/Time   CHOL 131 09/18/2017 0543   TRIG 110 09/18/2017 0543   HDL 29 (L) 09/18/2017 0543   CHOLHDL 4.5 09/18/2017 0543   VLDL 22 09/18/2017 0543   LDLCALC 80 09/18/2017 0543      Lab Results  Component Value Date   TSH 1.466 08/02/2014    Assessment & Plan:   1. Uncontrolled type 2 diabetes mellitus with hyperglycemia (HCC)  - [name] has currently uncontrolled symptomatic type 2 DM since  86 years of age. -He presents with significantly improved glycemic profile and A1c was 7.1%, improving from 8.8%.   -his diabetes is complicated by coronary  artery disease and he remains at a high risk for more acute and chronic complications which include CAD, CVA, CKD, retinopathy, and neuropathy. These are all discussed in detail with him.  - I have counseled him on diet  and weight management  by adopting a carbohydrate restricted/protein rich diet. Patient is encouraged to switch to  unprocessed or minimally processed     complex starch and increased protein intake (animal or plant source), fruits, and vegetables. -  he is advised to stick to a routine mealtimes to eat 3 meals  a day and avoid unnecessary snacks ( to snack only to correct hypoglycemia).    -  Suggestion is made for him to avoid simple carbohydrates  from his diet including Cakes, Sweet Desserts / Pastries, Ice Cream, Soda (diet and regular), Sweet Tea, Candies, Chips, Cookies, Sweet Pastries,  Store Bought Juices, Alcohol in Excess of  1-2 drinks a day,  Artificial Sweeteners, Coffee Creamer, and "Sugar-free" Products. This will help patient to have stable blood glucose profile and potentially avoid unintended weight gain.   - he has been  scheduled with Jearld Fenton, RDN, CDE for diabetes education.  - I have approached him with the following individualized plan to manage  his diabetes and patient agrees:   -Reportedly, patient is not tolerating any of the oral medications, and he wishes to avoid all of not insulin treatment options.  -Given his presentation with tightly controlled glycemic profile, he is advised to lower his Tresiba to 40 units nightly, hold NovoLog for now.  His Tyler Aas was increased to  50 units by his PMD in the interim. -He is encouraged to continue to wear his CGM device at all times.  - he is encouraged to call clinic for blood glucose levels less than 70 or above 200 mg /dl.  -He is not a candidate for SGLT2 inhibitors nor incretin therapy. - Specific targets for  A1c;  LDL, HDL, Triglycerides, and  Waist Circumference were discussed with the patient.  2) Blood Pressure /Hypertension:  his blood pressure is uncontrolled to target.   he is advised to continue his current medications including amlodipine 2.5 mg p.o. daily with breakfast .    3) Lipids/Hyperlipidemia:   Review of his recent lipid panel showed  controlled  LDL at 80 .  he  is advised to continue the simvastatin 40 mg p.o. daily at bedtime.  Side effects and precautions discussed with him.  4)  Weight/Diet:  Body mass index is 22.87 kg/m.  -    he is not a candidate for weight loss. Exercise, and detailed carbohydrates information provided  -  detailed on discharge instructions.  5) Chronic Care/Health Maintenance:  -he  is on  Statin medications and  is encouraged to initiate and continue to follow up with Ophthalmology, Dentist,  Podiatrist at least yearly or according to recommendations, and advised to  stay away from smoking. I have recommended  yearly flu vaccine and pneumonia vaccine at least every 5 years; moderate intensity exercise for up to 150 minutes weekly; and  sleep for at least 7 hours a day.  - he is  advised to maintain close follow up with Rory Percy, MD for primary care needs, as well as his other providers for optimal and coordinated care.  - Time spent on this patient care encounter:  35 min, of which > 50% was spent in  counseling and the rest reviewing his blood glucose logs , discussing his hypoglycemia and hyperglycemia episodes, reviewing his current and  previous labs / studies  ( including abstraction from other facilities) and medications  doses and developing a  long term treatment plan and documenting his care.   Please refer to Patient Instructions for Blood Glucose Monitoring and Insulin/Medications Dosing Guide"  in media tab for additional information. Please  also refer to " Patient Self Inventory" in the Media  tab for reviewed elements of pertinent patient history.  [name] participated in the discussions, expressed understanding, and voiced agreement with the above plans.  All questions were answered to his satisfaction. he is encouraged to contact clinic should he have any questions or concerns prior to his return visit.   Follow up plan: - Return in about 3 months (around 05/06/2019) for Bring Meter and Logs- A1c in Office, Follow up with Pre-visit Labs.  Glade Lloyd, MD [facility] [address] [place], [place] [address] Phone: [phone]  Fax: [phone]    02/06/2019, 4:41 PM  This note was partially dictated with voice recognition software. Similar sounding words can be transcribed inadequately or may not  be corrected upon review.

## 2019-02-06 NOTE — Patient Instructions (Signed)
                                     Advice for Weight Management  -For most of us the best way to lose weight is by diet management. Generally speaking, diet management means consuming less calories intentionally which over time brings about progressive weight loss.  This can be achieved more effectively by restricting carbohydrate consumption to the minimum possible.  So, it is critically important to know your numbers: how much calorie you are consuming and how much calorie you need. More importantly, our carbohydrates sources should be unprocessed or minimally processed complex starch food items.   Sometimes, it is important to balance nutrition by increasing protein intake (animal or plant source), fruits, and vegetables.  -Sticking to a routine mealtime to eat 3 meals a day and avoiding unnecessary snacks is shown to have a big role in weight control. Under normal circumstances, the only time we lose real weight is when we are hungry, so allow hunger to take place- hunger means no food between meal times, only water.  It is not advisable to starve.   -It is better to avoid simple carbohydrates including: Cakes, Sweet Desserts, Ice Cream, Soda (diet and regular), Sweet Tea, Candies, Chips, Cookies, Store Bought Juices, Alcohol in Excess of  1-2 drinks a day, Artificial Sweeteners, Doughnuts, Coffee Creamers, "Sugar-free" Products, etc, etc.  This is not a complete list.....    -Consulting with certified diabetes educators is proven to provide you with the most accurate and current information on diet.  Also, you may be  interested in discussing diet options/exchanges , we can schedule a visit with Penny Crumpton, RDN, CDE for individualized nutrition education.  -Exercise: If you are able: 30 -60 minutes a day ,4 days a week, or 150 minutes a week.  The longer the better.  Combine stretch, strength, and aerobic activities.  If you were told in the past that you  have high risk for cardiovascular diseases, you may seek evaluation by your heart doctor prior to initiating moderate to intense exercise programs.                                  Additional Care Considerations for Diabetes   -Diabetes  is a chronic disease.  The most important care consideration is regular follow-up with your diabetes care provider with the goal being avoiding or delaying its complications and to take advantage of advances in medications and technology.    -Type 2 diabetes is known to coexist with other important comorbidities such as high blood pressure and high cholesterol.  It is critical to control not only the diabetes but also the high blood pressure and high cholesterol to minimize and delay the risk of complications including coronary artery disease, stroke, amputations, blindness, etc.    - Studies showed that people with diabetes will benefit from a class of medications known as ACE inhibitors and statins.  Unless there are specific reasons not to be on these medications, the standard of care is to consider getting one from these groups of medications at an optimal doses.  These medications are generally considered safe and proven to help protect the heart and the kidneys.    - People with diabetes are encouraged to initiate and maintain regular follow-up with eye doctors, foot doctors, dentists ,   and if necessary heart and kidney doctors.     - It is highly recommended that people with diabetes quit smoking or stay away from smoking, and get yearly  flu vaccine and pneumonia vaccine at least every 5 years.  One other important lifestyle recommendation is to ensure adequate sleep - at least 6-7 hours of uninterrupted sleep at night.  -Exercise: If you are able: 30 -60 minutes a day, 4 days a week, or 150 minutes a week.  The longer the better.  Combine stretch, strength, and aerobic activities.  If you were told in the past that you have high risk for cardiovascular  diseases, you may seek evaluation by your heart doctor prior to initiating moderate to intense exercise programs.     COVID-19 Vaccine Information can be found at: [email or web address] For questions related to vaccine distribution or appointments, please email [email or web address] or call [phone].        

## 2019-02-07 ENCOUNTER — Telehealth: Payer: Self-pay | Admitting: "Endocrinology

## 2019-02-07 NOTE — Telephone Encounter (Signed)
Pt is not sure why Dr Dorris Fetch changed him to [place] from Lantus. Please advise. He has a bunch of lantus and hates to throw it away

## 2019-02-07 NOTE — Telephone Encounter (Signed)
Discussed with pt, he stated he has lantus that he receives from the [place]. Advised pt he was ok to use the lantus per Dr. Dorris Fetch, understanding voiced per pt.

## 2019-02-18 ENCOUNTER — Encounter: Payer: Self-pay | Admitting: Nutrition

## 2019-02-18 NOTE — Patient Instructions (Signed)
Goals  Keep up the great job. Let Dr. nida know about your lower blood sugars. Eat 3-4 carb choices per meal Eat 1/2 pb sandwich and some milk before bed IF your Blood sugar is less than 100 mg/dl. Don't skip meals Use simple sugar source if BS is less than 70 to treat, wait 15 minutes and recheck BS. If lower than 80, drink 4-6 oz juice, soda or eat something sweet to bring it up and recheck in 15 minutes. Go to ER if still low after 2nd treatment. .Get A1C to 7% or less. Prevent hypoglycemia. 

## 2019-03-01 DIAGNOSIS — E119 Type 2 diabetes mellitus without complications: Secondary | ICD-10-CM | POA: Diagnosis not present

## 2019-03-01 DIAGNOSIS — I10 Essential (primary) hypertension: Secondary | ICD-10-CM | POA: Diagnosis not present

## 2019-04-03 DIAGNOSIS — E1165 Type 2 diabetes mellitus with hyperglycemia: Secondary | ICD-10-CM | POA: Diagnosis not present

## 2019-04-03 DIAGNOSIS — I10 Essential (primary) hypertension: Secondary | ICD-10-CM | POA: Diagnosis not present

## 2019-04-18 DIAGNOSIS — E1165 Type 2 diabetes mellitus with hyperglycemia: Secondary | ICD-10-CM | POA: Diagnosis not present

## 2019-04-18 DIAGNOSIS — E1140 Type 2 diabetes mellitus with diabetic neuropathy, unspecified: Secondary | ICD-10-CM | POA: Diagnosis not present

## 2019-04-18 DIAGNOSIS — E11649 Type 2 diabetes mellitus with hypoglycemia without coma: Secondary | ICD-10-CM | POA: Diagnosis not present

## 2019-04-18 DIAGNOSIS — Z0001 Encounter for general adult medical examination with abnormal findings: Secondary | ICD-10-CM | POA: Diagnosis not present

## 2019-04-18 DIAGNOSIS — Z6822 Body mass index (BMI) 22.0-22.9, adult: Secondary | ICD-10-CM | POA: Diagnosis not present

## 2019-05-03 DIAGNOSIS — I10 Essential (primary) hypertension: Secondary | ICD-10-CM | POA: Diagnosis not present

## 2019-05-08 ENCOUNTER — Encounter: Payer: Medicare Other | Attending: "Endocrinology | Admitting: Nutrition

## 2019-05-08 ENCOUNTER — Encounter: Payer: Self-pay | Admitting: Nutrition

## 2019-05-08 ENCOUNTER — Encounter: Payer: Self-pay | Admitting: "Endocrinology

## 2019-05-08 ENCOUNTER — Ambulatory Visit: Payer: Medicare Other | Admitting: "Endocrinology

## 2019-05-08 ENCOUNTER — Other Ambulatory Visit: Payer: Self-pay

## 2019-05-08 VITALS — Ht 67.0 in | Wt 142.0 lb

## 2019-05-08 VITALS — BP 142/55 | HR 57 | Ht 67.0 in | Wt 142.0 lb

## 2019-05-08 DIAGNOSIS — E1165 Type 2 diabetes mellitus with hyperglycemia: Secondary | ICD-10-CM

## 2019-05-08 DIAGNOSIS — E559 Vitamin D deficiency, unspecified: Secondary | ICD-10-CM | POA: Diagnosis not present

## 2019-05-08 DIAGNOSIS — I10 Essential (primary) hypertension: Secondary | ICD-10-CM

## 2019-05-08 DIAGNOSIS — E118 Type 2 diabetes mellitus with unspecified complications: Secondary | ICD-10-CM | POA: Insufficient documentation

## 2019-05-08 DIAGNOSIS — E785 Hyperlipidemia, unspecified: Secondary | ICD-10-CM | POA: Diagnosis not present

## 2019-05-08 DIAGNOSIS — E782 Mixed hyperlipidemia: Secondary | ICD-10-CM

## 2019-05-08 DIAGNOSIS — IMO0002 Reserved for concepts with insufficient information to code with codable children: Secondary | ICD-10-CM

## 2019-05-08 LAB — POCT GLYCOSYLATED HEMOGLOBIN (HGB A1C): Hemoglobin A1C: 7.3 % — AB (ref 4.0–5.6)

## 2019-05-08 NOTE — Progress Notes (Signed)
Medical Nutrition Therapy:  Appt start time: 1517  end time: 1030  Assessment:  Primary concerns today: Diabetes Follow up.  40 units of Lantus daily. He notes his BS drops frequently between meals if he is working. 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 He wears a decom.   He is eating 30-45 g CHO at meals most of the time. May need to increase to 60 g CHO at meals when he is working to prevent hypoglycemia. A1C  7.3%. Trying to eat more vegetables and fresh fruit. Drinks water. Wt Readings from Last 3 Encounters:  05/08/19 142 lb (64.4 kg)  05/08/19 142 lb (64.4 kg)  02/06/19 146 lb (66.2 kg)   Ht Readings from Last 3 Encounters:  05/08/19 5\' 7"  (1.702 m)  05/08/19 5\' 7"  (1.702 m)  02/06/19 5\' 7"  (1.702 m)   Body mass index is 22.24 kg/m. @BMIFA @ Facility age limit for growth percentiles is 20 years. Facility age limit for growth percentiles is 20 years.   Lab Results  Component Value Date   HGBA1C 7.1 (A) 02/06/2019   CMP Latest Ref Rng & Units 09/19/2017 09/18/2017 10/16/2015  Glucose 70 - 99 mg/dL 234(H) 158(H) 461(H)  BUN 8 - 23 mg/dL 18 14 23(H)  Creatinine 0.61 - 1.24 mg/dL 1.13 1.06 1.27(H)  Sodium 135 - 145 mmol/L 140 139 130(L)  Potassium 3.5 - 5.1 mmol/L 3.9 3.8 4.2  Chloride 98 - 111 mmol/L 107 107 96(L)  CO2 22 - 32 mmol/L 21(L) 21(L) 26  Calcium 8.9 - 10.3 mg/dL 9.0 8.8(L) 9.1  Total Protein 6.5 - 8.1 g/dL - - -  Total Bilirubin 0.3 - 1.2 mg/dL - - -  Alkaline Phos 38 - 126 U/L - - -  AST 15 - 41 U/L - - -  ALT 17 - 63 U/L - - -     Preferred Learning Style:   A  No preference indicated   Learning Readiness:   Ready  Change in progress   MEDICATIONS   DIETARY INTAKE:  B) Eggs, toast, applesauce L) Usually a sandwich and fruit.  D) Chicken, sweet potato, peas, baked beans, water .    Usual physical activity: ADL   Estimated energy needs: 1800  calories 200 g carbohydrates 135 g protein 50 g  fat  Progress Towards Goal(s):  In progress.   Nutritional Diagnosis:  NB-1.1 Food and nutrition-related knowledge deficit As related to Diabetes Type 2.  As evidenced by A1C 8.8%.    Intervention:  Nutrition and Diabetes education provided on My Plate, CHO counting, meal planning, portion sizes, timing of meals, avoiding snacks between meals unless having a low blood sugar, target ranges for A1C and blood sugars, signs/symptoms and treatment of hyper/hypoglycemia, monitoring blood sugars, taking medications as prescribed, benefits of exercising 30 minutes per day and prevention of complications of DM.  Goals  Keep up the great job. Let Dr. Dorris Fetch know about your lower blood sugars. Eat 3-4 carb choices per meal Eat 1/2 pb sandwich and some milk before bed IF your Blood sugar is less than 100 mg/dl. Don't skip meals Use simple sugar source if BS is less than 70 to treat, wait 15 minutes and recheck BS. If lower than 80, drink 4-6 oz juice, soda or eat something sweet to bring it up and recheck in 15 minutes. Go to ER if still low after 2nd treatment. .Get A1C to 7% or less. Prevent hypoglycemia.  Teaching Method Utilized: \  Visual Auditory Hands on  Handouts given during visit include: Diabetes Instructions   Barriers to learning/adherence to lifestyle change: none  Demonstrated degree of understanding via:  Teach Back   Monitoring/Evaluation:  Dietary intake, exercise, , and body weight in 3 month(s)

## 2019-05-08 NOTE — Patient Instructions (Signed)

## 2019-05-08 NOTE — Patient Instructions (Signed)
Goals  Keep up the great job. Let Dr. Dorris Fetch know about your lower blood sugars. Eat 3-4 carb choices per meal Eat 1/2 pb sandwich and some milk before bed IF your Blood sugar is less than 100 mg/dl. Don't skip meals Use simple sugar source if BS is less than 70 to treat, wait 15 minutes and recheck BS. If lower than 80, drink 4-6 oz juice, soda or eat something sweet to bring it up and recheck in 15 minutes. Go to ER if still low after 2nd treatment. .Get A1C to 7% or less. Prevent hypoglycemia.

## 2019-05-08 NOTE — Progress Notes (Signed)
05/08/2019, 10:26 AM  Endocrinology follow-up note   Subjective:    Patient ID: [name], male    DOB: 02/18/1932.  [name] is being seen in follow-up after he was seen in consultation for management of currently uncontrolled symptomatic diabetes requested by  Rory Percy, MD.   Past Medical History:  Diagnosis Date  . Asymptomatic stenosis of left vertebral artery 2002, 2005   Status post PTA/stent with redo; normal antegrade flow on dopplers 09/2013  . CAD (coronary artery disease) 1995   1CABG x3; redo in CABG x 1 2001 Free Radial to LAD; All grafts patent by Cath 08/2014: The proximal to mid LAD has poor retrograde filling from the LIMA due to in-stent restenosis. 50% ostial disease of free radial artery to the LAD.  [name] CAD (coronary artery disease) of artery bypass graft 2000   PCI x 2 - ostial & prox-mid LAD (BMS) for atretic LIMA-LAD  . CAD in native artery 1995   CABG x 3 - LIMA-LAD, SVG-OM2, SVG-rPDA  . Chest pain 09/2017  . DM (diabetes mellitus) type II controlled peripheral vascular disorder 2002   Left subclavian and left vertebral artery stenoses  . Glaucoma   . Hyperlipidemia LDL goal <70   . Hypertension, essential   . S/P CABG x 3 1995   . S/P Redo CABG x 1 2001   L Radial-LAD after 2 failed attempts @ LIMA-LAD PTCA; LAD stents 100% occluded  . Stenosis of left subclavian artery ([place]) 11/2003   Status post PTA/stent --> < 50 % stenosis by Dopplers 09/2013    Past Surgical History:  Procedure Laterality Date  . CARDIAC CATHETERIZATION N/A 08/04/2014   Procedure: Left Heart Cath and Cors/Grafts Angiography;  Surgeon: Leonie Man, MD;  Location: [facility];  Service: Cardiovascular: o-mRCA 70%, m-dRCA ~70% - SVG-dRCA Patent.  o-pLAD 100% stent occluded, mLAD 95% ISR with poor retrograde filling from freeRadiial graft-LAD (50% ostial graft dz).  o-p Cx 80%. Widely  patent SVG-Cx-OM fills retrograde to 80% lesion.; Normal LV Fxn & EDP  . CORONARY ANGIOPLASTY  April and May 2001   After Both LAD stents occluded - PTCA of anastomatic LIMA-LAD lesion -- Unsuccessful.  . CORONARY ARTERY BYPASS GRAFT  1995    LIMA-LAD, SVG-OM2, SVG-rPDA  . CORONARY ARTERY BYPASS GRAFT  June 2001   Dr. Lucianne Lei Trigt: Redo LAD grafting with free Left Radial-distal LAD  . CORONARY STENT PLACEMENT  1995-2000   2 BMS stents to osital-proximal & proximal-mid LAD; because of atretic LIMA-LDA  . LEFT HEART CATH AND CORONARY ANGIOGRAPHY N/A 09/18/2017   Procedure: LEFT HEART CATH AND CORONARY ANGIOGRAPHY;  Surgeon: Martinique, Peter M, MD;  Location: [facility];  Service: Cardiovascular;  Laterality: N/A;  . LEFT HEART CATHETERIZATION WITH CORONARY/GRAFT ANGIOGRAM N/A 08/22/2011   Procedure: LEFT HEART CATHETERIZATION WITH Beatrix Fetters;  Surgeon: Leonie Man, MD;  Location: [facility];  Service: Cardiovascular:  Known occluded LIMA-LAD and ostial LAD. Moderate to severe proximal circumflex and RCA disease. Widely patent freeLRAD-dLAD, as well as SVG-RPDA (backfilling RPL), SVG-OM 2 (backfilling OM1)  . SHOULDER OPEN ROTATOR CUFF  REPAIR  October 2004   Dr. Noemi Chapel  . SP Ninfa Meeker OR THOR CAROTID STENT Left October 2002; November 2005   Left Vertebral stent placed in October 2002 (Dr. Patrecia Pour); redo PCI in 2005  . SUBCLAVIAN ARTERY STENT Left November 2005   Dr. Gwenlyn Found    Social History   Socioeconomic History  . Marital status: Married    Spouse name: Not on file  . Number of children: Not on file  . Years of education: Not on file  . Highest education level: Not on file  Occupational History  . Not on file  Tobacco Use  . Smoking status: Former Smoker    Packs/day: 3.00    Years: 30.00    Pack years: 90.00    Types: Cigarettes  . Smokeless tobacco: Never Used  . Tobacco comment: quit smoking about 50 years ago  Substance and Sexual Activity  .  Alcohol use: No  . Drug use: No  . Sexual activity: Not Currently  Other Topics Concern  . Not on file  Social History Narrative   He is married with one daughter. He has 2 grandchildren.   He does not smoke. He quit smoking in roughly 1970 after smoking 3 packs per day.   He is routinely at give him. It does not necessarily do routine exercise.    Social Determinants of Health   Financial Resource Strain:   . Difficulty of Paying Living Expenses:   Food Insecurity:   . Worried About Charity fundraiser in the Last Year:   . Arboriculturist in the Last Year:   Transportation Needs:   . Film/video editor (Medical):   [name] Lack of Transportation (Non-Medical):   Physical Activity:   . Days of Exercise per Week:   . Minutes of Exercise per Session:   Stress:   . Feeling of Stress :   Social Connections:   . Frequency of Communication with Friends and Family:   . Frequency of Social Gatherings with Friends and Family:   . Attends Religious Services:   . Active Member of Clubs or Organizations:   . Attends Archivist Meetings:   [name] Marital Status:     Family History  Problem Relation Age of Onset  . Diabetes Mother   . Diabetes Sister   . Diabetes Brother     Outpatient Encounter Medications as of 05/08/2019  Medication Sig  . insulin glargine (LANTUS) 100 UNIT/ML injection Inject 40 Units into the skin at bedtime.  [name] acetaminophen (TYLENOL) 500 MG tablet Take 1,000 mg by mouth every 6 (six) hours as needed for mild pain or headache.  [name] acyclovir (ZOVIRAX) 400 MG tablet Take 400 mg by mouth 2 (two) times daily with a meal.   . amLODipine (NORVASC) 2.5 MG tablet Take 1 tablet (2.5 mg total) by mouth daily.  [name] aspirin EC 81 MG tablet Take 81 mg by mouth daily with breakfast.   . Brinzolamide-Brimonidine (SIMBRINZA) 1-0.2 % SUSP Place 1 drop into both eyes 3 (three) times daily.  . Cholecalciferol (VITAMIN D) 2000 units tablet Take 2,000 Units by mouth daily with  breakfast.  . clopidogrel (PLAVIX) 75 MG tablet Take 1 tablet (75 mg total) by mouth daily. (Patient taking differently: Take 75 mg by mouth daily with breakfast. )  . doxazosin (CARDURA) 8 MG tablet Take 8 mg by mouth at bedtime.   . fluticasone (FLONASE) 50 MCG/ACT nasal spray Place 1 spray into both nostrils daily  as needed (seasonal allergies).   . gabapentin (NEURONTIN) 300 MG capsule Take 600 mg by mouth See admin instructions. Take two capsules (600 mg) by mouth three times daily - breakfast, supper and bedtime  . GLOBAL INJECT EASE LANCETS 30G MISC DIABETIC CLUB - USE TO CHECK BLOOD GLUCOSE LEVELS  . glucose blood test strip DIABETIC CLUB - USE TO CHECK BLOOD GLUCOSE LEVELS  . hydrocortisone cream 1 % Apply 1 application topically daily as needed for itching.  . isosorbide mononitrate (IMDUR) 60 MG 24 hr tablet May take 1 and 1/2 tablets to 2 tablets a day depending on if chest discomfort is present  . latanoprost (XALATAN) 0.005 % ophthalmic solution Place 1 drop into the right eye at bedtime.   [name] loratadine (CLARITIN) 10 MG tablet Take 10 mg by mouth daily as needed (seasonal allergies).   . Omega-3 Fatty Acids (FISH OIL) 1000 MG CAPS Take 1,000 mg by mouth 2 (two) times daily with a meal.   . pantoprazole (PROTONIX) 40 MG tablet Take 40 mg by mouth daily with breakfast.  . prednisoLONE acetate (PRED FORTE) 1 % ophthalmic suspension Place 1 drop into the left eye 2 (two) times daily.  . simvastatin (ZOCOR) 80 MG tablet Take 40 mg by mouth at bedtime.   . timolol (TIMOPTIC) 0.5 % ophthalmic solution Place 1 drop into both eyes daily.   . vitamin C (ASCORBIC ACID) 500 MG tablet Take 500 mg by mouth 2 (two) times daily with a meal.   . [DISCONTINUED] insulin degludec (TRESIBA FLEXTOUCH) 100 UNIT/ML SOPN FlexTouch Pen Inject 0.4 mLs (40 Units total) into the skin at bedtime.   No facility-administered encounter medications on file as of 05/08/2019.    ALLERGIES: Allergies  Allergen  Reactions  . Iohexol Other (See Comments)    Intractable shaking.  . Contrast Media [Iodinated Diagnostic Agents]     Shivering & shaking. Pt reports takes antihistamine prior to procedures.  . Metformin And Related Diarrhea    Subsequently discontinued    VACCINATION STATUS: Immunization History  Administered Date(s) Administered  . Influenza, High Dose Seasonal PF 09/17/2017  . Influenza-Unspecified 09/03/2013    Diabetes He presents for his follow-up diabetic visit. He has type 2 diabetes mellitus. Onset time: He was diagnosed at approximate age of 71 years. His disease course has been improving. Pertinent negatives for hypoglycemia include no confusion, headaches, hunger, nervousness/anxiousness, pallor, seizures or sweats. Pertinent negatives for diabetes include no chest pain, no fatigue, no polydipsia, no polyphagia, no polyuria and no weakness. There are no hypoglycemic complications. Symptoms are improving. Diabetic complications include heart disease. Risk factors for coronary artery disease include dyslipidemia, diabetes mellitus, family history, male sex, hypertension, sedentary lifestyle and tobacco exposure. Current diabetic treatment includes insulin injections (He is currently on Lantus 40 units nightly, wears a Dexcom CGM.  ). His weight is decreasing steadily. He is following a generally unhealthy diet. When asked about meal planning, he reported none. He has not had a previous visit with a dietitian. He participates in exercise intermittently. His home blood glucose trend is fluctuating minimally. His breakfast blood glucose range is generally 110-130 mg/dl. His bedtime blood glucose range is generally 180-200 mg/dl. His overall blood glucose range is 140-180 mg/dl. Eye exam is current.  Hyperlipidemia This is a chronic problem. The current episode started more than 1 year ago. The problem is controlled. Exacerbating diseases include diabetes. Pertinent negatives include no  chest pain, myalgias or shortness of breath. Current antihyperlipidemic treatment includes  statins. Risk factors for coronary artery disease include dyslipidemia, diabetes mellitus, hypertension, male sex and a sedentary lifestyle.  Hypertension This is a chronic problem. The current episode started more than 1 year ago. Pertinent negatives include no chest pain, headaches, neck pain, palpitations, shortness of breath or sweats. Risk factors for coronary artery disease include family history, dyslipidemia, male gender, diabetes mellitus, sedentary lifestyle and smoking/tobacco exposure. Past treatments include direct vasodilators and calcium channel blockers. Hypertensive end-organ damage includes CAD/MI.     Review of Systems  Constitutional: Negative for chills, fatigue, fever and unexpected weight change.  HENT: Negative for dental problem, mouth sores and trouble swallowing.   Eyes: Negative for visual disturbance.  Respiratory: Negative for cough, choking, chest tightness, shortness of breath and wheezing.   Cardiovascular: Negative for chest pain, palpitations and leg swelling.  Gastrointestinal: Negative for abdominal distention, abdominal pain, constipation, diarrhea, nausea and vomiting.  Endocrine: Negative for polydipsia, polyphagia and polyuria.  Genitourinary: Negative for dysuria, flank pain, hematuria and urgency.  Musculoskeletal: Negative for back pain, gait problem, myalgias and neck pain.  Skin: Negative for pallor, rash and wound.  Neurological: Negative for seizures, syncope, weakness, numbness and headaches.  Psychiatric/Behavioral: Negative for confusion and dysphoric mood. The patient is not nervous/anxious.     Objective:    BP (!) 142/55   Pulse (!) 57   Ht 5\' 7"  (1.702 m)   Wt 142 lb (64.4 kg)   BMI 22.24 kg/m   Wt Readings from Last 3 Encounters:  05/08/19 142 lb (64.4 kg)  02/06/19 146 lb (66.2 kg)  02/06/19 146 lb (66.2 kg)     Physical  Exam Constitutional:      General: He is not in acute distress.    Appearance: He is well-developed.  HENT:     Head: Normocephalic and atraumatic.  Neck:     Thyroid: No thyromegaly.     Trachea: No tracheal deviation.  Cardiovascular:     Rate and Rhythm: Normal rate.     Pulses:          Dorsalis pedis pulses are 1+ on the right side and 1+ on the left side.       Posterior tibial pulses are 1+ on the right side and 1+ on the left side.     Heart sounds: S1 normal and S2 normal. No murmur. No gallop.   Pulmonary:     Effort: Pulmonary effort is normal. No respiratory distress.     Breath sounds: No wheezing.  Abdominal:     General: There is no distension.     Tenderness: There is no abdominal tenderness. There is no guarding.  Musculoskeletal:     Right shoulder: No swelling or deformity.     Cervical back: Normal range of motion and neck supple.  Skin:    General: Skin is warm and dry.     Findings: No rash.     Nails: There is no clubbing.  Neurological:     Mental Status: He is alert and oriented to person, place, and time.     Cranial Nerves: No cranial nerve deficit.     Sensory: No sensory deficit.     Gait: Gait normal.     Deep Tendon Reflexes: Reflexes are normal and symmetric.  Psychiatric:        Speech: Speech normal.        Behavior: Behavior normal. Behavior is cooperative.        Thought Content: Thought content normal.  Judgment: Judgment normal.     CMP     Component Value Date/Time   NA 140 09/19/2017 0420   K 3.9 09/19/2017 0420   CL 107 09/19/2017 0420   CO2 21 (L) 09/19/2017 0420   GLUCOSE 234 (H) 09/19/2017 0420   BUN 18 09/19/2017 0420   CREATININE 1.13 09/19/2017 0420   CALCIUM 9.0 09/19/2017 0420   PROT 6.1 (L) 09/13/2014 1619   ALBUMIN 3.6 09/13/2014 1619   AST 24 09/13/2014 1619   ALT 21 09/13/2014 1619   ALKPHOS 30 (L) 09/13/2014 1619   BILITOT 0.6 09/13/2014 1619   GFRNONAA 58 (L) 09/19/2017 0420   GFRAA >60 09/19/2017  0420     Diabetic Labs (most recent): Lab Results  Component Value Date   HGBA1C 7.3 (A) 05/08/2019   HGBA1C 7.1 (A) 02/06/2019   HGBA1C 8.8 09/07/2018     Lipid Panel ( most recent) Lipid Panel     Component Value Date/Time   CHOL 131 09/18/2017 0543   TRIG 110 09/18/2017 0543   HDL 29 (L) 09/18/2017 0543   CHOLHDL 4.5 09/18/2017 0543   VLDL 22 09/18/2017 0543   LDLCALC 80 09/18/2017 0543      Lab Results  Component Value Date   TSH 1.466 08/02/2014    Assessment & Plan:   1. Uncontrolled type 2 diabetes mellitus with hyperglycemia (HCC)  - [name] has currently uncontrolled symptomatic type 2 DM since  86 years of age. -He presents with controlled glycemic profile fasting and postprandial and A1c of 7.3%, progressively improving from 8.8%.    -his diabetes is complicated by coronary  artery disease and he remains at a high risk for more acute and chronic complications which include CAD, CVA, CKD, retinopathy, and neuropathy. These are all discussed in detail with him.  - I have counseled him on diet management  by adopting a carbohydrate restricted/protein rich diet. Patient is encouraged to switch to  unprocessed or minimally processed     complex starch and increased protein intake (animal or plant source), fruits, and vegetables. -  he is advised to stick to a routine mealtimes to eat 3 meals  a day and avoid unnecessary snacks ( to snack only to correct hypoglycemia).    -  Suggestion is made for him to avoid simple carbohydrates  from his diet including Cakes, Sweet Desserts / Pastries, Ice Cream, Soda (diet and regular), Sweet Tea, Candies, Chips, Cookies, Sweet Pastries,  Store Bought Juices, Alcohol in Excess of  1-2 drinks a day, Artificial Sweeteners, Coffee Creamer, and "Sugar-free" Products. This will help patient to have stable blood glucose profile and potentially avoid unintended weight gain.    - he has been  scheduled with Jearld Fenton, RDN,  CDE for diabetes education.  - I have approached him with the following individualized plan to manage  his diabetes and patient agrees:   -He did not report tolerate oral antidiabetic medications in previous attempt.   -Given his presentation with tightly controlled fasting and near target postprandial glycemic profile he will not need prandial insulin for now.  He is advised to continue Lantus 40 units nightly, continue to hold NovoLog for now.   -His Dexcom CGM was reviewed, no significant hypoglycemia.  -He is encouraged to continue to wear his CGM device at all times.  - he is encouraged to call clinic for blood glucose levels less than 70 or above 200 mg /dl.  -He is not a candidate for SGLT2  inhibitors nor incretin therapy. - Specific targets for  A1c;  LDL, HDL, Triglycerides,  were discussed with the patient.  2) Blood Pressure /Hypertension:  his blood pressure is not controlled to target.     he is advised to continue his current medications including amlodipine 2.5 mg p.o. daily with breakfast .    3) Lipids/Hyperlipidemia:   Review of his recent lipid panel showed  controlled  LDL at 80 .  he  is advised to continue the simvastatin 40 mg daily at bedtime.    Side effects and precautions discussed with him.  4)  Weight/Diet:  Body mass index is 22.24 kg/m.  -He is not a candidate for weight loss.   \ Exercise, and detailed carbohydrates information provided  -  detailed on discharge instructions.  5) Chronic Care/Health Maintenance:  -he  is on  Statin medications and  is encouraged to initiate and continue to follow up with Ophthalmology, Dentist,  Podiatrist at least yearly or according to recommendations, and advised to  stay away from smoking. I have recommended yearly flu vaccine and pneumonia vaccine at least every 5 years; moderate intensity exercise for up to 150 minutes weekly; and  sleep for at least 7 hours a day.  - he is  advised to maintain close follow up with  Rory Percy, MD for primary care needs, as well as his other providers for optimal and coordinated care.  - Time spent on this patient care encounter:  35 min, of which > 50% was spent in  counseling and the rest reviewing his blood glucose logs , discussing his hypoglycemia and hyperglycemia episodes, reviewing his current and  previous labs / studies  ( including abstraction from other facilities) and medications  doses and developing a  long term treatment plan and documenting his care.   Please refer to Patient Instructions for Blood Glucose Monitoring and Insulin/Medications Dosing Guide"  in media tab for additional information. Please  also refer to " Patient Self Inventory" in the Media  tab for reviewed elements of pertinent patient history.  [name] participated in the discussions, expressed understanding, and voiced agreement with the above plans.  All questions were answered to his satisfaction. he is encouraged to contact clinic should he have any questions or concerns prior to his return visit.    Follow up plan: - Return in about 4 months (around 09/08/2019) for Bring Meter and Logs- A1c in Office, Follow up with Pre-visit Labs.  Glade Lloyd, MD [facility] [address] [place], [place] [address] Phone: [phone]  Fax: [phone]    05/08/2019, 10:26 AM  This note was partially dictated with voice recognition software. Similar sounding words can be transcribed inadequately or may not  be corrected upon review.

## 2019-06-03 DIAGNOSIS — I482 Chronic atrial fibrillation, unspecified: Secondary | ICD-10-CM | POA: Diagnosis not present

## 2019-06-03 DIAGNOSIS — I10 Essential (primary) hypertension: Secondary | ICD-10-CM | POA: Diagnosis not present

## 2019-06-03 DIAGNOSIS — Z794 Long term (current) use of insulin: Secondary | ICD-10-CM | POA: Diagnosis not present

## 2019-06-03 DIAGNOSIS — E1165 Type 2 diabetes mellitus with hyperglycemia: Secondary | ICD-10-CM | POA: Diagnosis not present

## 2019-06-28 DIAGNOSIS — R2232 Localized swelling, mass and lump, left upper limb: Secondary | ICD-10-CM | POA: Diagnosis not present

## 2019-06-28 DIAGNOSIS — T63441A Toxic effect of venom of bees, accidental (unintentional), initial encounter: Secondary | ICD-10-CM | POA: Diagnosis not present

## 2019-07-03 DIAGNOSIS — Z794 Long term (current) use of insulin: Secondary | ICD-10-CM | POA: Diagnosis not present

## 2019-07-03 DIAGNOSIS — I10 Essential (primary) hypertension: Secondary | ICD-10-CM | POA: Diagnosis not present

## 2019-07-03 DIAGNOSIS — E1165 Type 2 diabetes mellitus with hyperglycemia: Secondary | ICD-10-CM | POA: Diagnosis not present

## 2019-07-03 DIAGNOSIS — I482 Chronic atrial fibrillation, unspecified: Secondary | ICD-10-CM | POA: Diagnosis not present

## 2019-08-02 DIAGNOSIS — I10 Essential (primary) hypertension: Secondary | ICD-10-CM | POA: Diagnosis not present

## 2019-08-02 DIAGNOSIS — E1165 Type 2 diabetes mellitus with hyperglycemia: Secondary | ICD-10-CM | POA: Diagnosis not present

## 2019-08-02 DIAGNOSIS — Z794 Long term (current) use of insulin: Secondary | ICD-10-CM | POA: Diagnosis not present

## 2019-08-02 DIAGNOSIS — I482 Chronic atrial fibrillation, unspecified: Secondary | ICD-10-CM | POA: Diagnosis not present

## 2019-08-12 DIAGNOSIS — B9789 Other viral agents as the cause of diseases classified elsewhere: Secondary | ICD-10-CM | POA: Diagnosis not present

## 2019-08-12 DIAGNOSIS — J22 Unspecified acute lower respiratory infection: Secondary | ICD-10-CM | POA: Diagnosis not present

## 2019-08-26 DIAGNOSIS — R062 Wheezing: Secondary | ICD-10-CM | POA: Diagnosis not present

## 2019-09-03 DIAGNOSIS — E1165 Type 2 diabetes mellitus with hyperglycemia: Secondary | ICD-10-CM | POA: Diagnosis not present

## 2019-09-03 DIAGNOSIS — Z794 Long term (current) use of insulin: Secondary | ICD-10-CM | POA: Diagnosis not present

## 2019-09-03 DIAGNOSIS — I482 Chronic atrial fibrillation, unspecified: Secondary | ICD-10-CM | POA: Diagnosis not present

## 2019-09-03 DIAGNOSIS — I10 Essential (primary) hypertension: Secondary | ICD-10-CM | POA: Diagnosis not present

## 2019-09-11 ENCOUNTER — Ambulatory Visit: Payer: Medicare Other | Admitting: Nutrition

## 2019-09-11 ENCOUNTER — Ambulatory Visit: Payer: Medicare Other | Admitting: Nurse Practitioner

## 2019-10-02 ENCOUNTER — Ambulatory Visit: Payer: Medicare Other | Admitting: Cardiology

## 2019-10-02 ENCOUNTER — Encounter: Payer: Self-pay | Admitting: Cardiology

## 2019-10-02 ENCOUNTER — Other Ambulatory Visit: Payer: Self-pay

## 2019-10-02 VITALS — BP 150/68 | HR 60 | Temp 98.1°F | Ht 67.0 in | Wt 137.0 lb

## 2019-10-02 DIAGNOSIS — Z951 Presence of aortocoronary bypass graft: Secondary | ICD-10-CM

## 2019-10-02 DIAGNOSIS — E782 Mixed hyperlipidemia: Secondary | ICD-10-CM | POA: Diagnosis not present

## 2019-10-02 DIAGNOSIS — Z01810 Encounter for preprocedural cardiovascular examination: Secondary | ICD-10-CM

## 2019-10-02 DIAGNOSIS — I25119 Atherosclerotic heart disease of native coronary artery with unspecified angina pectoris: Secondary | ICD-10-CM

## 2019-10-02 DIAGNOSIS — I10 Essential (primary) hypertension: Secondary | ICD-10-CM

## 2019-10-02 DIAGNOSIS — I739 Peripheral vascular disease, unspecified: Secondary | ICD-10-CM | POA: Diagnosis not present

## 2019-10-02 NOTE — Patient Instructions (Addendum)
Medication Instructions:  No changes  *If you need a refill on your cardiac medications before your next appointment, please call your pharmacy*   Lab Work:   Please have labs done at primary - LIPID , CMP  If you have labs (blood work) drawn today and your tests are completely normal, you will receive your results only by: [name] MyChart Message (if you have MyChart) OR . A paper copy in the mail If you have any lab test that is abnormal or we need to change your treatment, we will call you to review the results.   Testing/Procedures: Not needed   Follow-Up: At [facility] you and your health needs are our priority.  As part of our continuing mission to provide you with exceptional heart care, we have created designated Provider Care Teams.  These Care Teams include your primary Cardiologist (physician) and Advanced Practice Providers (APPs -  Physician Assistants and Nurse Practitioners) who all work together to provide you with the care you need, when you need it.  We recommend signing up for the patient portal called "MyChart".  Sign up information is provided on this After Visit Summary.  MyChart is used to connect with patients for Virtual Visits (Telemedicine).  Patients are able to view lab/test results, encounter notes, upcoming appointments, etc.  Non-urgent messages can be sent to your provider as well.   To learn more about what you can do with MyChart, go to [email or web address].    Your next appointment:   12 month(s)  The format for your next appointment:   In Person  Provider:   Glenetta Hew, MD    OK to proceed with Left Eye Surgery - If necessary can hold Aspirin & Plavix/clopidogrel 5-7 days prior to surgery.

## 2019-10-02 NOTE — Progress Notes (Signed)
Primary Care Provider: Rory Percy, MD Cardiologist: Glenetta Hew, MD Electrophysiologist: None  Clinic Note: Chief Complaint  Patient presents with  . Follow-up    annual; recent URI - on Inhaler  . Coronary Artery Disease    no real angina  . Pre-op Exam    L Eye Sgx   HPI:    [name] is a 86 y.o. male with a PMH notable for CAD-CABG (originally 1995, redo in 2001) w/ CHRONIC STABLE ANGINA, HTN, HLD, DM-2 who presents today for annual follow-up & Pre-Op Evaluation for L Eye Sgx.   Last Cath 10/2017: Severe native CAD - Patent grafts (freeRad-LAD, SVG-dRCA, SVG-OM1 that backfills OM2)).  Normal LV Fxn & EDP.  (See diagram below)  [name] was last seen on September 29 of 2020 -> insulin of down depressed mood.  He noted that he was aching and sore all the time since his most recent CABG.  Diffuse chest discomfort.  Mostly reproducible on palpation.  Exercise limited to diabetic neuropathy so therefore not doing much exercise.  Notes exertional dyspnea but no change.  No chest pain or pressure with exertion.  Recent Hospitalizations: None  Reviewed  CV studies:    The following studies were reviewed today: (if available, images/films reviewed: From Epic Chart or Care Everywhere) . Carotid Dopplers: pending next month   Interval History:   [name] returns here today overall doing okay.  He was recently started on inhaler for a bout of wheezing and coughing that he has been having.  He has been having off-and-on cough and since then but says the breathing is better.  Occasionally has this chest tightness that is associated with coughing and wheezing, but not really having his true anginal symptoms.  He describes a diffuse chest discomfort area which is right along the left lateral midclavicular line.  (Very similar to each of the last 3-4 visits).  Still has peripheral neuropathy that limits his walking.  He is active and does what he wants to do taking  care of things around the house--but noticed that he is not as active as he was.  He wakes up every now and then short of breath is probably more related to his recent bout with an upper respiratory issue.  Does not really sound like PND.  No orthopnea.  No palpitations. He has lost some weight over the last several months, partly because he says he has not been feeling well.  CV Review of Symptoms (Summary): positive for - chest pain, shortness of breath and Shortness of breath is exacerbated right now by his URI symptoms.  Not truly having PND or orthopnea.  Limited by peripheral neuropathy in the legs not claudication. negative for - edema, irregular heartbeat, palpitations, paroxysmal nocturnal dyspnea, rapid heart rate or Rapid irregular heartbeats or palpitations, lightheadedness or dizziness, syncope/near syncope or TIA/amaurosis fugax, claudication.  He has an upcoming left eye surgery.  The patient does not have symptoms concerning for COVID-19 infection (fever, chills, cough, or new shortness of breath).   REVIEWED OF SYSTEMS   Review of Systems  Constitutional: Positive for malaise/fatigue (Notes less energy of late because he is not feeling well.) and weight loss (Not eating well).  HENT: Positive for congestion. Negative for nosebleeds.   Respiratory: Positive for cough, shortness of breath and wheezing. Negative for hemoptysis.        Recently started on inhaler and antibiotics for likely URI.  Wheezing has improved with inhaler.  Still coughing.  Cardiovascular: Positive for chest pain (Chest wall pain worse with cough).  Gastrointestinal: Negative for blood in stool and melena.  Genitourinary: Positive for frequency (Nocturia). Negative for hematuria.  Musculoskeletal: Positive for joint pain. Negative for falls and myalgias.  Neurological: Positive for tingling (Pedal neuropathy) and weakness (Generalized). Negative for dizziness.  Psychiatric/Behavioral: Positive for  depression (He answers questions in a way that would indicate at least dysthymia.) and memory loss. The patient is not nervous/anxious and does not have insomnia.     I have reviewed and (if needed) personally updated the patient's problem list, medications, allergies, past medical and surgical history, social and family history.   PAST MEDICAL HISTORY   Past Medical History:  Diagnosis Date  . Asymptomatic stenosis of left vertebral artery 2002, 2005   Status post PTA/stent with redo; normal antegrade flow on dopplers 09/2013  . CAD (coronary artery disease) 1995   1CABG x3; redo in CABG x 1 2001 Free Radial to LAD; All grafts patent by Cath 08/2014: The proximal to mid LAD has poor retrograde filling from the LIMA due to in-stent restenosis. 50% ostial disease of free radial artery to the LAD.  [name] CAD (coronary artery disease) of artery bypass graft 2000   PCI x 2 - ostial & prox-mid LAD (BMS) for atretic LIMA-LAD  . CAD in native artery 1995   CABG x 3 - LIMA-LAD, SVG-OM2, SVG-rPDA  . Chest pain 09/2017  . DM (diabetes mellitus) type II controlled peripheral vascular disorder 2002   Left subclavian and left vertebral artery stenoses  . Glaucoma   . Hyperlipidemia LDL goal <70   . Hypertension, essential   . S/P CABG x 3 1995   . S/P Redo CABG x 1 2001   L Radial-LAD after 2 failed attempts @ LIMA-LAD PTCA; LAD stents 100% occluded  . Stenosis of left subclavian artery ([place]) 11/2003   Status post PTA/stent --> < 50 % stenosis by Dopplers 09/2013    PAST SURGICAL HISTORY   Past Surgical History:  Procedure Laterality Date  . CARDIAC CATHETERIZATION N/A 08/04/2014   Procedure: Left Heart Cath and Cors/Grafts Angiography;  Surgeon: Leonie Man, MD;  Location: [place] [facility];  Service: Cardiovascular: o-mRCA 70%, m-dRCA ~70% - SVG-dRCA Patent.  o-pLAD 100% stent occluded, mLAD 95% ISR with poor retrograde filling from freeRadiial graft-LAD (50% ostial graft dz).  o-p Cx 80%. Widely  patent SVG-Cx-OM fills retrograde to 80% lesion.; Normal LV Fxn & EDP  . CORONARY ANGIOPLASTY  April and May 2001   After Both LAD stents occluded - PTCA of anastomatic LIMA-LAD lesion -- Unsuccessful.  . CORONARY ARTERY BYPASS GRAFT  1995    LIMA-LAD, SVG-OM2, SVG-rPDA  . CORONARY ARTERY BYPASS GRAFT  June 2001   Dr. Lucianne Lei Trigt: Redo LAD grafting with free Left Radial-distal LAD  . CORONARY STENT PLACEMENT  1995-2000   2 BMS stents to osital-proximal & proximal-mid LAD; because of atretic LIMA-LDA  . LEFT HEART CATH AND CORONARY ANGIOGRAPHY N/A 09/18/2017   Procedure: LEFT HEART CATH AND CORONARY ANGIOGRAPHY;  Surgeon: Martinique, Peter M, MD;  Location: [place] [facility];  Service: Cardiovascular;  Laterality: N/A;  . LEFT HEART CATHETERIZATION WITH CORONARY/GRAFT ANGIOGRAM N/A 08/22/2011   Procedure: LEFT HEART CATHETERIZATION WITH Beatrix Fetters;  Surgeon: Leonie Man, MD;  Location: [facility];  Service: Cardiovascular:  Known occluded LIMA-LAD and ostial LAD. Moderate to severe proximal circumflex and RCA disease. Widely patent freeLRAD-dLAD, as well as  SVG-RPDA (backfilling RPL), SVG-OM 2 (backfilling OM1)  . SHOULDER OPEN ROTATOR CUFF REPAIR  October 2004   Dr. Noemi Chapel  . SP Ninfa Meeker OR THOR CAROTID STENT Left October 2002; November 2005   Left Vertebral stent placed in October 2002 (Dr. Patrecia Pour); redo PCI in 2005  . SUBCLAVIAN ARTERY STENT Left November 2005   Dr. Gwenlyn Found   Cath 09/2017    Immunization History  Administered Date(s) Administered  . Influenza, High Dose Seasonal PF 09/17/2017  . Influenza-Unspecified 09/03/2013    MEDICATIONS/ALLERGIES   Current Meds  Medication Sig  . acetaminophen (TYLENOL) 500 MG tablet Take 1,000 mg by mouth every 6 (six) hours as needed for mild pain or headache.  [name] acyclovir (ZOVIRAX) 400 MG tablet Take 400 mg by mouth 2 (two) times daily with a meal.   . aspirin EC 81 MG tablet Take 81 mg by mouth daily with breakfast.     . Brinzolamide-Brimonidine (SIMBRINZA) 1-0.2 % SUSP Place 1 drop into both eyes 3 (three) times daily.  . Cholecalciferol (VITAMIN D) 2000 units tablet Take 2,000 Units by mouth daily with breakfast.  . clopidogrel (PLAVIX) 75 MG tablet Take 1 tablet (75 mg total) by mouth daily. (Patient taking differently: Take 75 mg by mouth daily with breakfast. )  . doxazosin (CARDURA) 8 MG tablet Take 8 mg by mouth at bedtime.   . fluticasone (FLONASE) 50 MCG/ACT nasal spray Place 1 spray into both nostrils daily as needed (seasonal allergies).   . gabapentin (NEURONTIN) 300 MG capsule Take 600 mg by mouth See admin instructions. Take two capsules (600 mg) by mouth three times daily - breakfast, supper and bedtime  . GLOBAL INJECT EASE LANCETS 30G MISC DIABETIC CLUB - USE TO CHECK BLOOD GLUCOSE LEVELS  . glucose blood test strip DIABETIC CLUB - USE TO CHECK BLOOD GLUCOSE LEVELS  . hydrocortisone cream 1 % Apply 1 application topically daily as needed for itching.  . insulin glargine (LANTUS) 100 UNIT/ML injection Inject 40 Units into the skin at bedtime.  . isosorbide mononitrate (IMDUR) 60 MG 24 hr tablet May take 1 and 1/2 tablets to 2 tablets a day depending on if chest discomfort is present  . latanoprost (XALATAN) 0.005 % ophthalmic solution Place 1 drop into the right eye at bedtime.   [name] loratadine (CLARITIN) 10 MG tablet Take 10 mg by mouth daily as needed (seasonal allergies).   . Omega-3 Fatty Acids (FISH OIL) 1000 MG CAPS Take 1,000 mg by mouth 2 (two) times daily with a meal.   . omeprazole (PRILOSEC) 20 MG capsule Take 20 mg by mouth daily.  . prednisoLONE acetate (PRED FORTE) 1 % ophthalmic suspension Place 1 drop into the left eye 2 (two) times daily.  . simvastatin (ZOCOR) 80 MG tablet Take 40 mg by mouth at bedtime.   . timolol (TIMOPTIC) 0.5 % ophthalmic solution Place 1 drop into both eyes daily.   . vitamin C (ASCORBIC ACID) 500 MG tablet Take 500 mg by mouth 2 (two) times daily with a  meal.     Allergies  Allergen Reactions  . Iohexol Other (See Comments)    Intractable shaking.  . Contrast Media [Iodinated Diagnostic Agents]     Shivering & shaking. Pt reports takes antihistamine prior to procedures.  . Metformin And Related Diarrhea    Subsequently discontinued    SOCIAL HISTORY/FAMILY HISTORY   Reviewed in Epic:  Pertinent findings: Still lives independently.  Cares for himself with normal ADLs.  OBJCTIVE -PE,  EKG, labs   Wt Readings from Last 3 Encounters:  10/02/19 137 lb (62.1 kg)  05/08/19 142 lb (64.4 kg)  05/08/19 142 lb (64.4 kg)    Physical Exam: BP (!) 150/68   Pulse 60   Temp 98.1 F (36.7 C)   Ht 5\' 7"  (1.702 m)   Wt 137 lb (62.1 kg)   SpO2 96%   BMI 21.46 kg/m  Physical Exam    Adult ECG Report  Rate: 60 ;  Rhythm: normal sinus rhythm and Voltage consistent with LVH.  Inferior MI, age undetermined.  Otherwise normal axis, intervals and durations.;   Narrative Interpretation: Stable  Recent Labs:  Has not  Been checked lately. - [facility] Results  Component Value Date   CHOL 131 09/18/2017   HDL 29 (L) 09/18/2017   LDLCALC 80 09/18/2017   TRIG 110 09/18/2017   CHOLHDL 4.5 09/18/2017   Lab Results  Component Value Date   CREATININE 1.13 09/19/2017   BUN 18 09/19/2017   NA 140 09/19/2017   K 3.9 09/19/2017   CL 107 09/19/2017   CO2 21 (L) 09/19/2017   Lab Results  Component Value Date   TSH 1.466 08/02/2014    ASSESSMENT/PLAN    Problem List Items Addressed This Visit    CAD -> CABG x3 then Re-DO CABG x1 (LRad-dLAD) after atretic LIMA & LAD stent occlusion. (Chronic)    Last cath was in September 2019 demonstrating 303 patent grafts with severe native vessel disease.  The SVG-OM1 does backfill the rest of the LCx.  He has chronic chest discomfort ever since his redo surgery I think a lot of that is musculoskeletal.  We have been treating this chest comfort as possible angina, but unlikely to be  truly angina in nature.  Plan: Continue amlodipine and isosorbide mononitrate at 60 mg daily.  -->  Beta-blocker because of bradycardia and fatigue.  Not on ACE inhibitor/ARB because of hypotension and preferred use of a calcium channel blocker for angina.  Remains on simvastatin 40 mg daily.  Labs are supposedly being followed by PCP, need to get labs.  If not we can reorder them.  He is on aspirin and Plavix for maintenance--we can probably stop his aspirin if he has any bleeding or bruising issues.  OK to hold both ASA & Plavix 5-7 days pre-op for Surgeries / procedures.      Relevant Orders   EKG 12-Lead (Completed)   S/P CABG (coronary artery bypass graft): Initial CABGX3 1995 (LIMA-LAD, SVG-OM 2, SVG-RPDA) --> redo CABG x1 FreeLRad-LAD for a occluded LAD with atretic LIMA and failed attempted revascularization. - Primary (Chronic)    Last cath was just 2 years ago.  At this point I do not know that we would do another ischemic evaluation for screening purposes.  Would only consider testing where he to have breakthrough episodes.  Not sure that he truthfully would like to go through any further procedures.      Relevant Orders   EKG 12-Lead (Completed)   Mixed hyperlipidemia (Chronic)    Not exactly sure why, but he is back on simvastatin despite him being on amlodipine and my recommendation to switch to either atorvastatin or rosuvastatin.  Defer to PCP who is following his lipid levels.  At this point with his age, would not be overly aggressive.      Essential hypertension, benign (Chronic)    Remains hypertensive today.  He has been having issues with hypotension in the  past, therefore I am reluctant to add medications.  We are allowing mild permissive hypertension.  If his pressures continue to be at this level, would probably consider increasing amlodipine to 5 mg.      Peripheral arterial disease - left vertebral and subclavian artery stenosis status post PTA/stent  (Chronic)    Due for carotid Doppler evaluation to reassess the stent.      Relevant Orders   EKG 12-Lead (Completed)   Preop cardiovascular exam       COVID-19 Education: The signs and symptoms of COVID-19 were discussed with the patient and how to seek care for testing (follow up with PCP or arrange E-visit).   The importance of social distancing and COVID-19 vaccination was discussed today. 2 min --> he is Waiting to hear about scheduling his BOOSTER  The patient is practicing social distancing & Masking.   I spent a total of 57minutes with the patient spent in direct patient consultation.  Additional time spent with chart review  / charting (studies, outside notes, etc): 10 Total Time: 34 min   Current medicines are reviewed at length with the patient today.  (+/- concerns) n/a  Notice: This dictation was prepared with Dragon dictation along with smaller phrase technology. Any transcriptional errors that result from this process are unintentional and may not be corrected upon review.  Patient Instructions / Medication Changes & Studies & Tests Ordered   Patient Instructions  Medication Instructions:  No changes  *If you need a refill on your cardiac medications before your next appointment, please call your pharmacy*   Lab Work:   Please have labs done at primary - LIPID , CMP  If you have labs (blood work) drawn today and your tests are completely normal, you will receive your results only by: [name] MyChart Message (if you have MyChart) OR . A paper copy in the mail If you have any lab test that is abnormal or we need to change your treatment, we will call you to review the results.   Testing/Procedures: Not needed   Follow-Up: At [facility] you and your health needs are our priority.  As part of our continuing mission to provide you with exceptional heart care, we have created designated Provider Care Teams.  These Care Teams include your primary Cardiologist  (physician) and Advanced Practice Providers (APPs -  Physician Assistants and Nurse Practitioners) who all work together to provide you with the care you need, when you need it.  We recommend signing up for the patient portal called "MyChart".  Sign up information is provided on this After Visit Summary.  MyChart is used to connect with patients for Virtual Visits (Telemedicine).  Patients are able to view lab/test results, encounter notes, upcoming appointments, etc.  Non-urgent messages can be sent to your provider as well.   To learn more about what you can do with MyChart, go to [email or web address].    Your next appointment:   12 month(s)  The format for your next appointment:   In Person  Provider:   Glenetta Hew, MD    OK to proceed with Left Eye Surgery - If necessary can hold Aspirin & Plavix/clopidogrel 5-7 days prior to surgery.   Studies Ordered:   Orders Placed This Encounter  Procedures  . EKG 12-Lead     Glenetta Hew, M.D., M.S. Interventional Cardiologist   Pager # [phone] Phone # [phone] [place], [place] [address]   Thank you for choosing Heartcare at [facility]!!

## 2019-10-03 ENCOUNTER — Other Ambulatory Visit: Payer: Self-pay | Admitting: Cardiology

## 2019-10-03 DIAGNOSIS — I482 Chronic atrial fibrillation, unspecified: Secondary | ICD-10-CM | POA: Diagnosis not present

## 2019-10-03 DIAGNOSIS — I10 Essential (primary) hypertension: Secondary | ICD-10-CM | POA: Diagnosis not present

## 2019-10-03 DIAGNOSIS — Z794 Long term (current) use of insulin: Secondary | ICD-10-CM | POA: Diagnosis not present

## 2019-10-03 DIAGNOSIS — E1165 Type 2 diabetes mellitus with hyperglycemia: Secondary | ICD-10-CM | POA: Diagnosis not present

## 2019-10-04 DIAGNOSIS — R5383 Other fatigue: Secondary | ICD-10-CM | POA: Diagnosis not present

## 2019-10-04 DIAGNOSIS — J440 Chronic obstructive pulmonary disease with acute lower respiratory infection: Secondary | ICD-10-CM | POA: Diagnosis not present

## 2019-10-04 DIAGNOSIS — I10 Essential (primary) hypertension: Secondary | ICD-10-CM | POA: Diagnosis not present

## 2019-10-04 DIAGNOSIS — K219 Gastro-esophageal reflux disease without esophagitis: Secondary | ICD-10-CM | POA: Diagnosis not present

## 2019-10-04 DIAGNOSIS — E1140 Type 2 diabetes mellitus with diabetic neuropathy, unspecified: Secondary | ICD-10-CM | POA: Diagnosis not present

## 2019-10-06 ENCOUNTER — Encounter: Payer: Self-pay | Admitting: Cardiology

## 2019-10-06 DIAGNOSIS — Z01810 Encounter for preprocedural cardiovascular examination: Secondary | ICD-10-CM | POA: Insufficient documentation

## 2019-10-06 NOTE — Assessment & Plan Note (Signed)
Remains hypertensive today.  He has been having issues with hypotension in the past, therefore I am reluctant to add medications.  We are allowing mild permissive hypertension.  If his pressures continue to be at this level, would probably consider increasing amlodipine to 5 mg.

## 2019-10-06 NOTE — Assessment & Plan Note (Signed)
Not exactly sure why, but he is back on simvastatin despite him being on amlodipine and my recommendation to switch to either atorvastatin or rosuvastatin.  Defer to PCP who is following his lipid levels.  At this point with his age, would not be overly aggressive.

## 2019-10-06 NOTE — Assessment & Plan Note (Signed)
Due for carotid Doppler evaluation to reassess the stent.

## 2019-10-06 NOTE — Assessment & Plan Note (Signed)
Last cath was in September 2019 demonstrating 303 patent grafts with severe native vessel disease.  The SVG-OM1 does backfill the rest of the LCx.  He has chronic chest discomfort ever since his redo surgery I think a lot of that is musculoskeletal.  We have been treating this chest comfort as possible angina, but unlikely to be truly angina in nature.  Plan: Continue amlodipine and isosorbide mononitrate at 60 mg daily.  -->  Beta-blocker because of bradycardia and fatigue.  Not on ACE inhibitor/ARB because of hypotension and preferred use of a calcium channel blocker for angina.  Remains on simvastatin 40 mg daily.  Labs are supposedly being followed by PCP, need to get labs.  If not we can reorder them.  He is on aspirin and Plavix for maintenance--we can probably stop his aspirin if he has any bleeding or bruising issues.  OK to hold both ASA & Plavix 5-7 days pre-op for Surgeries / procedures.

## 2019-10-06 NOTE — Assessment & Plan Note (Signed)
Last cath was just 2 years ago.  At this point I do not know that we would do another ischemic evaluation for screening purposes.  Would only consider testing where he to have breakthrough episodes.  Not sure that he truthfully would like to go through any further procedures.

## 2019-10-07 DIAGNOSIS — E1165 Type 2 diabetes mellitus with hyperglycemia: Secondary | ICD-10-CM | POA: Diagnosis not present

## 2019-10-07 DIAGNOSIS — Z23 Encounter for immunization: Secondary | ICD-10-CM | POA: Diagnosis not present

## 2019-10-07 DIAGNOSIS — E1140 Type 2 diabetes mellitus with diabetic neuropathy, unspecified: Secondary | ICD-10-CM | POA: Diagnosis not present

## 2019-10-07 DIAGNOSIS — Z6822 Body mass index (BMI) 22.0-22.9, adult: Secondary | ICD-10-CM | POA: Diagnosis not present

## 2019-10-07 DIAGNOSIS — E11649 Type 2 diabetes mellitus with hypoglycemia without coma: Secondary | ICD-10-CM | POA: Diagnosis not present

## 2019-10-15 ENCOUNTER — Ambulatory Visit (HOSPITAL_COMMUNITY)
Admission: RE | Admit: 2019-10-15 | Discharge: 2019-10-15 | Disposition: A | Discharge status: Home / Self Care | Payer: Medicare Other | Source: Ambulatory Visit | Attending: Cardiovascular Disease | Admitting: Cardiovascular Disease

## 2019-10-15 ENCOUNTER — Other Ambulatory Visit (HOSPITAL_COMMUNITY): Payer: Self-pay | Admitting: Cardiology

## 2019-10-15 ENCOUNTER — Other Ambulatory Visit: Payer: Self-pay

## 2019-10-15 DIAGNOSIS — I6523 Occlusion and stenosis of bilateral carotid arteries: Secondary | ICD-10-CM | POA: Insufficient documentation

## 2019-10-23 ENCOUNTER — Telehealth: Payer: Self-pay | Admitting: Cardiology

## 2019-10-23 NOTE — Telephone Encounter (Signed)
   [facility] Pre-operative Risk Assessment    HEARTCARE STAFF: - Please ensure there is not already an duplicate clearance open for this procedure. - Under Visit Info/Reason for Call, type in Other and utilize the format Clearance MM/DD/YY or Clearance TBD. Do not use dashes or single digits. - If request is for dental extraction, please clarify the # of teeth to be extracted.  Request for surgical clearance:  1. What type of surgery is being performed? Cornea Transplant with a facial block with EPI   2. When is this surgery scheduled? Now  3. What type of clearance is required (medical clearance vs. Pharmacy clearance to hold med vs. Both)? Medical  4. Are there any medications that need to be held prior to surgery and how long? TBD by Dr. Ellyn Hack  5. Practice name and name of physician performing surgery? [facility]; Dr. Geronimo Running   6. What is the office phone number? [phone]   7.   What is the office fax number? [phone]  8.   Anesthesia type (None, local, MAC, general) ? Moderate Sedation with a facial that includes EPI   Durel Salts 10/23/2019, 9:01 AM  _________________________________________________________________   (provider comments below)

## 2019-10-23 NOTE — Telephone Encounter (Signed)
Left VM  Per Dr. Ellyn Hack, may hold ASA and plavix 5-7 days if needed.

## 2019-10-28 NOTE — Telephone Encounter (Signed)
   Primary Cardiologist: Glenetta Hew, MD  Chart reviewed as part of pre-operative protocol coverage. Patient was recently seen in the office 10/02/19 by Dr. Ellyn Hack. He has chronic chest pain and shortness of breath, which shortness of breath was exacerbated at that time by URI symptoms. Dr. Ellyn Hack noted upcoming procedure and recommended holding Plavix and Aspirin for 5-7 days prior to procedure. Given past medical history and time since last visit, based on ACC/AHA guidelines, [name] would be at acceptable risk for the planned procedure without further cardiovascular testing.   As stated above, per Dr. Ellyn Hack it is okay to hold Aspirin and Plavix 5-7 days prior to procedure.  The patient was advised that if he develops new symptoms prior to surgery to contact our office to arrange for a follow-up visit, and he verbalized understanding.  I will route this recommendation to the requesting party via Epic fax function and remove from pre-op pool.  Please call with questions.  Antha Niday Ninfa Meeker, PA-C 10/28/2019, 2:16 PM

## 2019-11-02 DIAGNOSIS — I10 Essential (primary) hypertension: Secondary | ICD-10-CM | POA: Diagnosis not present

## 2019-11-02 DIAGNOSIS — I482 Chronic atrial fibrillation, unspecified: Secondary | ICD-10-CM | POA: Diagnosis not present

## 2019-11-02 DIAGNOSIS — Z794 Long term (current) use of insulin: Secondary | ICD-10-CM | POA: Diagnosis not present

## 2019-11-02 DIAGNOSIS — E1165 Type 2 diabetes mellitus with hyperglycemia: Secondary | ICD-10-CM | POA: Diagnosis not present

## 2019-12-03 DIAGNOSIS — Z794 Long term (current) use of insulin: Secondary | ICD-10-CM | POA: Diagnosis not present

## 2019-12-03 DIAGNOSIS — I10 Essential (primary) hypertension: Secondary | ICD-10-CM | POA: Diagnosis not present

## 2019-12-03 DIAGNOSIS — E1165 Type 2 diabetes mellitus with hyperglycemia: Secondary | ICD-10-CM | POA: Diagnosis not present

## 2020-01-02 DIAGNOSIS — E1140 Type 2 diabetes mellitus with diabetic neuropathy, unspecified: Secondary | ICD-10-CM | POA: Diagnosis not present

## 2020-01-02 DIAGNOSIS — I10 Essential (primary) hypertension: Secondary | ICD-10-CM | POA: Diagnosis not present

## 2020-01-02 DIAGNOSIS — E1165 Type 2 diabetes mellitus with hyperglycemia: Secondary | ICD-10-CM | POA: Diagnosis not present

## 2020-01-03 DIAGNOSIS — I10 Essential (primary) hypertension: Secondary | ICD-10-CM | POA: Diagnosis not present

## 2020-01-03 DIAGNOSIS — I482 Chronic atrial fibrillation, unspecified: Secondary | ICD-10-CM | POA: Diagnosis not present

## 2020-01-03 DIAGNOSIS — E1165 Type 2 diabetes mellitus with hyperglycemia: Secondary | ICD-10-CM | POA: Diagnosis not present

## 2020-01-03 DIAGNOSIS — Z7984 Long term (current) use of oral hypoglycemic drugs: Secondary | ICD-10-CM | POA: Diagnosis not present

## 2020-01-21 DIAGNOSIS — E1122 Type 2 diabetes mellitus with diabetic chronic kidney disease: Secondary | ICD-10-CM | POA: Diagnosis not present

## 2020-01-21 DIAGNOSIS — Z6823 Body mass index (BMI) 23.0-23.9, adult: Secondary | ICD-10-CM | POA: Diagnosis not present

## 2020-01-21 DIAGNOSIS — I2510 Atherosclerotic heart disease of native coronary artery without angina pectoris: Secondary | ICD-10-CM | POA: Diagnosis not present

## 2020-01-21 DIAGNOSIS — I129 Hypertensive chronic kidney disease with stage 1 through stage 4 chronic kidney disease, or unspecified chronic kidney disease: Secondary | ICD-10-CM | POA: Diagnosis not present

## 2020-01-21 DIAGNOSIS — E785 Hyperlipidemia, unspecified: Secondary | ICD-10-CM | POA: Diagnosis not present

## 2020-01-21 DIAGNOSIS — E1165 Type 2 diabetes mellitus with hyperglycemia: Secondary | ICD-10-CM | POA: Diagnosis not present

## 2020-01-21 DIAGNOSIS — E1169 Type 2 diabetes mellitus with other specified complication: Secondary | ICD-10-CM | POA: Diagnosis not present

## 2020-02-01 DIAGNOSIS — I10 Essential (primary) hypertension: Secondary | ICD-10-CM | POA: Diagnosis not present

## 2020-02-01 DIAGNOSIS — I482 Chronic atrial fibrillation, unspecified: Secondary | ICD-10-CM | POA: Diagnosis not present

## 2020-02-01 DIAGNOSIS — E1165 Type 2 diabetes mellitus with hyperglycemia: Secondary | ICD-10-CM | POA: Diagnosis not present

## 2020-02-01 DIAGNOSIS — Z794 Long term (current) use of insulin: Secondary | ICD-10-CM | POA: Diagnosis not present

## 2020-02-20 DIAGNOSIS — H401412 Capsular glaucoma with pseudoexfoliation of lens, right eye, moderate stage: Secondary | ICD-10-CM | POA: Diagnosis not present

## 2020-03-02 DIAGNOSIS — I482 Chronic atrial fibrillation, unspecified: Secondary | ICD-10-CM | POA: Diagnosis not present

## 2020-03-02 DIAGNOSIS — E1165 Type 2 diabetes mellitus with hyperglycemia: Secondary | ICD-10-CM | POA: Diagnosis not present

## 2020-03-02 DIAGNOSIS — I10 Essential (primary) hypertension: Secondary | ICD-10-CM | POA: Diagnosis not present

## 2020-03-02 DIAGNOSIS — Z794 Long term (current) use of insulin: Secondary | ICD-10-CM | POA: Diagnosis not present

## 2020-03-03 DIAGNOSIS — H401112 Primary open-angle glaucoma, right eye, moderate stage: Secondary | ICD-10-CM | POA: Diagnosis not present

## 2020-03-12 DIAGNOSIS — E1139 Type 2 diabetes mellitus with other diabetic ophthalmic complication: Secondary | ICD-10-CM | POA: Diagnosis not present

## 2020-03-12 DIAGNOSIS — R9431 Abnormal electrocardiogram [ECG] [EKG]: Secondary | ICD-10-CM | POA: Diagnosis not present

## 2020-03-12 DIAGNOSIS — E119 Type 2 diabetes mellitus without complications: Secondary | ICD-10-CM | POA: Diagnosis not present

## 2020-03-12 DIAGNOSIS — H409 Unspecified glaucoma: Secondary | ICD-10-CM | POA: Diagnosis not present

## 2020-03-12 DIAGNOSIS — Z951 Presence of aortocoronary bypass graft: Secondary | ICD-10-CM | POA: Diagnosis not present

## 2020-03-12 DIAGNOSIS — G43809 Other migraine, not intractable, without status migrainosus: Secondary | ICD-10-CM | POA: Diagnosis not present

## 2020-03-12 DIAGNOSIS — I10 Essential (primary) hypertension: Secondary | ICD-10-CM | POA: Diagnosis not present

## 2020-03-12 DIAGNOSIS — R6889 Other general symptoms and signs: Secondary | ICD-10-CM | POA: Diagnosis not present

## 2020-03-12 DIAGNOSIS — R519 Headache, unspecified: Secondary | ICD-10-CM | POA: Diagnosis not present

## 2020-03-12 DIAGNOSIS — E1165 Type 2 diabetes mellitus with hyperglycemia: Secondary | ICD-10-CM | POA: Diagnosis not present

## 2020-03-12 DIAGNOSIS — G43909 Migraine, unspecified, not intractable, without status migrainosus: Secondary | ICD-10-CM | POA: Diagnosis not present

## 2020-03-12 DIAGNOSIS — Z20822 Contact with and (suspected) exposure to covid-19: Secondary | ICD-10-CM | POA: Diagnosis not present

## 2020-03-12 DIAGNOSIS — Z794 Long term (current) use of insulin: Secondary | ICD-10-CM | POA: Diagnosis not present

## 2020-03-12 DIAGNOSIS — R1110 Vomiting, unspecified: Secondary | ICD-10-CM | POA: Diagnosis not present

## 2020-03-12 DIAGNOSIS — Z743 Need for continuous supervision: Secondary | ICD-10-CM | POA: Diagnosis not present

## 2020-03-12 DIAGNOSIS — G319 Degenerative disease of nervous system, unspecified: Secondary | ICD-10-CM | POA: Diagnosis not present

## 2020-03-12 DIAGNOSIS — G4489 Other headache syndrome: Secondary | ICD-10-CM | POA: Diagnosis not present

## 2020-03-27 DIAGNOSIS — G43909 Migraine, unspecified, not intractable, without status migrainosus: Secondary | ICD-10-CM | POA: Diagnosis not present

## 2020-04-01 DIAGNOSIS — I482 Chronic atrial fibrillation, unspecified: Secondary | ICD-10-CM | POA: Diagnosis not present

## 2020-04-01 DIAGNOSIS — Z794 Long term (current) use of insulin: Secondary | ICD-10-CM | POA: Diagnosis not present

## 2020-04-01 DIAGNOSIS — E1165 Type 2 diabetes mellitus with hyperglycemia: Secondary | ICD-10-CM | POA: Diagnosis not present

## 2020-04-01 DIAGNOSIS — I10 Essential (primary) hypertension: Secondary | ICD-10-CM | POA: Diagnosis not present

## 2020-04-09 DIAGNOSIS — E1142 Type 2 diabetes mellitus with diabetic polyneuropathy: Secondary | ICD-10-CM | POA: Diagnosis not present

## 2020-04-09 DIAGNOSIS — E1139 Type 2 diabetes mellitus with other diabetic ophthalmic complication: Secondary | ICD-10-CM | POA: Diagnosis not present

## 2020-04-09 DIAGNOSIS — I493 Ventricular premature depolarization: Secondary | ICD-10-CM | POA: Diagnosis not present

## 2020-04-09 DIAGNOSIS — R5381 Other malaise: Secondary | ICD-10-CM | POA: Diagnosis not present

## 2020-04-09 DIAGNOSIS — Z7902 Long term (current) use of antithrombotics/antiplatelets: Secondary | ICD-10-CM | POA: Diagnosis not present

## 2020-04-09 DIAGNOSIS — R197 Diarrhea, unspecified: Secondary | ICD-10-CM | POA: Diagnosis not present

## 2020-04-09 DIAGNOSIS — I10 Essential (primary) hypertension: Secondary | ICD-10-CM | POA: Diagnosis not present

## 2020-04-09 DIAGNOSIS — Z20822 Contact with and (suspected) exposure to covid-19: Secondary | ICD-10-CM | POA: Diagnosis not present

## 2020-04-09 DIAGNOSIS — Z794 Long term (current) use of insulin: Secondary | ICD-10-CM | POA: Diagnosis not present

## 2020-04-09 DIAGNOSIS — Z743 Need for continuous supervision: Secondary | ICD-10-CM | POA: Diagnosis not present

## 2020-04-09 DIAGNOSIS — R079 Chest pain, unspecified: Secondary | ICD-10-CM | POA: Diagnosis not present

## 2020-04-09 DIAGNOSIS — R0789 Other chest pain: Secondary | ICD-10-CM | POA: Diagnosis not present

## 2020-04-09 DIAGNOSIS — G8929 Other chronic pain: Secondary | ICD-10-CM | POA: Diagnosis not present

## 2020-04-09 DIAGNOSIS — R112 Nausea with vomiting, unspecified: Secondary | ICD-10-CM | POA: Diagnosis not present

## 2020-04-09 DIAGNOSIS — E1151 Type 2 diabetes mellitus with diabetic peripheral angiopathy without gangrene: Secondary | ICD-10-CM | POA: Diagnosis not present

## 2020-04-09 DIAGNOSIS — E119 Type 2 diabetes mellitus without complications: Secondary | ICD-10-CM | POA: Diagnosis not present

## 2020-04-09 DIAGNOSIS — R531 Weakness: Secondary | ICD-10-CM | POA: Diagnosis not present

## 2020-04-09 DIAGNOSIS — H409 Unspecified glaucoma: Secondary | ICD-10-CM | POA: Diagnosis not present

## 2020-04-09 DIAGNOSIS — R9431 Abnormal electrocardiogram [ECG] [EKG]: Secondary | ICD-10-CM | POA: Diagnosis not present

## 2020-04-09 DIAGNOSIS — Z7982 Long term (current) use of aspirin: Secondary | ICD-10-CM | POA: Diagnosis not present

## 2020-04-09 DIAGNOSIS — I2510 Atherosclerotic heart disease of native coronary artery without angina pectoris: Secondary | ICD-10-CM | POA: Diagnosis not present

## 2020-04-09 DIAGNOSIS — G4489 Other headache syndrome: Secondary | ICD-10-CM | POA: Diagnosis not present

## 2020-04-09 DIAGNOSIS — Z8679 Personal history of other diseases of the circulatory system: Secondary | ICD-10-CM | POA: Diagnosis not present

## 2020-04-09 DIAGNOSIS — I160 Hypertensive urgency: Secondary | ICD-10-CM | POA: Diagnosis not present

## 2020-04-09 DIAGNOSIS — E785 Hyperlipidemia, unspecified: Secondary | ICD-10-CM | POA: Diagnosis not present

## 2020-04-09 DIAGNOSIS — Z79899 Other long term (current) drug therapy: Secondary | ICD-10-CM | POA: Diagnosis not present

## 2020-04-09 DIAGNOSIS — R519 Headache, unspecified: Secondary | ICD-10-CM | POA: Diagnosis not present

## 2020-04-09 DIAGNOSIS — Z951 Presence of aortocoronary bypass graft: Secondary | ICD-10-CM | POA: Diagnosis not present

## 2020-04-10 DIAGNOSIS — E1151 Type 2 diabetes mellitus with diabetic peripheral angiopathy without gangrene: Secondary | ICD-10-CM | POA: Diagnosis not present

## 2020-04-10 DIAGNOSIS — I351 Nonrheumatic aortic (valve) insufficiency: Secondary | ICD-10-CM | POA: Diagnosis not present

## 2020-04-10 DIAGNOSIS — Z8679 Personal history of other diseases of the circulatory system: Secondary | ICD-10-CM | POA: Diagnosis not present

## 2020-04-10 DIAGNOSIS — Z7982 Long term (current) use of aspirin: Secondary | ICD-10-CM | POA: Diagnosis not present

## 2020-04-10 DIAGNOSIS — I2510 Atherosclerotic heart disease of native coronary artery without angina pectoris: Secondary | ICD-10-CM | POA: Diagnosis not present

## 2020-04-10 DIAGNOSIS — I739 Peripheral vascular disease, unspecified: Secondary | ICD-10-CM | POA: Diagnosis not present

## 2020-04-10 DIAGNOSIS — R0989 Other specified symptoms and signs involving the circulatory and respiratory systems: Secondary | ICD-10-CM | POA: Diagnosis not present

## 2020-04-10 DIAGNOSIS — I10 Essential (primary) hypertension: Secondary | ICD-10-CM | POA: Diagnosis not present

## 2020-04-10 DIAGNOSIS — I080 Rheumatic disorders of both mitral and aortic valves: Secondary | ICD-10-CM | POA: Diagnosis not present

## 2020-04-10 DIAGNOSIS — I340 Nonrheumatic mitral (valve) insufficiency: Secondary | ICD-10-CM | POA: Diagnosis not present

## 2020-04-10 DIAGNOSIS — Z951 Presence of aortocoronary bypass graft: Secondary | ICD-10-CM | POA: Diagnosis not present

## 2020-04-10 DIAGNOSIS — Z9114 Patient's other noncompliance with medication regimen: Secondary | ICD-10-CM | POA: Diagnosis not present

## 2020-04-10 DIAGNOSIS — I5189 Other ill-defined heart diseases: Secondary | ICD-10-CM | POA: Diagnosis not present

## 2020-04-10 DIAGNOSIS — I160 Hypertensive urgency: Secondary | ICD-10-CM | POA: Diagnosis not present

## 2020-04-10 DIAGNOSIS — R079 Chest pain, unspecified: Secondary | ICD-10-CM | POA: Diagnosis not present

## 2020-04-10 HISTORY — PX: TRANSTHORACIC ECHOCARDIOGRAM: SHX275

## 2020-04-13 ENCOUNTER — Telehealth: Payer: Self-pay | Admitting: Cardiology

## 2020-04-13 NOTE — Telephone Encounter (Signed)
Spoke to patient appointment scheduled with Dr.Harding 4/28 at 3:40 pm.

## 2020-04-13 NOTE — Telephone Encounter (Signed)
   [facility] Pre-operative Risk Assessment    Request for surgical clearance:  1. What type of surgery is being performed? Express Shunt right eye  2. When is this surgery scheduled? 04/15/2020  3. What type of clearance is required (medical clearance vs. Pharmacy clearance to hold med vs. Both)? Medical Clearance  4. Are there any medications that need to be held prior to surgery and how long? Blood thinners  5. Practice name and name of physician performing surgery? [facility]  6. What is your office phone number [phone]   7.   What is your office fax number [phone]  8.   Anesthesia type (None, local, MAC, general) ?  Local   [place] if faxing Pre-Op Clearance paperwork to Dr. Ellyn Hack today  Larina Bras 04/13/2020, 12:32 PM  _________________________________________________________________   (provider comments below)

## 2020-04-13 NOTE — Telephone Encounter (Signed)
     Patient Name: [name]  DOB: 1932/07/04  MRN: [id number]   Primary Cardiologist: Glenetta Hew, MD  Chart reviewed as part of pre-operative protocol coverage. Given past medical history and time since last visit, based on ACC/AHA guidelines, LEALON SKILLINGS would be at acceptable risk for the planned procedure without further cardiovascular testing.   Patient recently admitted in [facility] for high blood pressure and had some medication changes. Patient has been taking bps at home and says they have been high at home. He reports stable chronic chest pain and sob. Denies lower leg edema, orthopnea, pnd.   Patient stopped ASA and Plavix 3 today-three days prior to procedure. Per Dr. Allison Quarry last note in September okay to hold ASA and Plavix 5-7 days prior to procedures/durgeris.   Will send this Triage team to get patient in the office for BP check.   The patient was advised that if he develops new symptoms prior to surgery to contact our office to arrange for a follow-up visit, and he verbalized understanding.  I will route this recommendation to the requesting party via Epic fax function and remove from pre-op pool.  Please call with questions.  Nehal Witting Ninfa Meeker, PA-C 04/13/2020, 1:59 PM

## 2020-04-30 ENCOUNTER — Ambulatory Visit: Payer: Medicare Other | Admitting: Cardiology

## 2020-04-30 ENCOUNTER — Encounter: Payer: Self-pay | Admitting: Cardiology

## 2020-04-30 ENCOUNTER — Other Ambulatory Visit: Payer: Self-pay

## 2020-04-30 VITALS — BP 150/68 | HR 77 | Ht 67.0 in | Wt 131.0 lb

## 2020-04-30 DIAGNOSIS — I10 Essential (primary) hypertension: Secondary | ICD-10-CM

## 2020-04-30 DIAGNOSIS — E782 Mixed hyperlipidemia: Secondary | ICD-10-CM

## 2020-04-30 DIAGNOSIS — I25119 Atherosclerotic heart disease of native coronary artery with unspecified angina pectoris: Secondary | ICD-10-CM

## 2020-04-30 DIAGNOSIS — R634 Abnormal weight loss: Secondary | ICD-10-CM

## 2020-04-30 DIAGNOSIS — Z951 Presence of aortocoronary bypass graft: Secondary | ICD-10-CM | POA: Diagnosis not present

## 2020-04-30 MED ORDER — AMLODIPINE BESYLATE 2.5 MG PO TABS: 2.5000 mg | ORAL_TABLET | ORAL | 3 refills | Status: DC | PRN

## 2020-04-30 MED ORDER — NITROGLYCERIN 0.4 MG SL SUBL: 0.4000 mg | SUBLINGUAL_TABLET | SUBLINGUAL | 6 refills | Status: DC | PRN

## 2020-04-30 MED ORDER — AMLODIPINE BESYLATE 10 MG PO TABS: 10.0000 mg | ORAL_TABLET | Freq: Every day | ORAL | 3 refills | Status: DC

## 2020-04-30 NOTE — Progress Notes (Signed)
Primary Care Provider: Rory Percy, MD Cardiologist: Glenetta Hew, MD Electrophysiologist: None  Clinic Note: Chief Complaint  Patient presents with  . Hospitalization Follow-up    Uncontrolled hypertension  . Coronary Artery Disease    ===================================  ASSESSMENT/PLAN   Problem List Items Addressed This Visit    Weight loss    Continued gradual weight loss.  Encouraged increased on/improved nutrition.      CAD -> CABG x3 then Re-DO CABG x1 (LRad-dLAD) after atretic LIMA & LAD stent occlusion. (Chronic)    As per last cath in September 2019, all grafts were patent, all native coronary arteries were occluded.  Chronic chest pain presents is reading surgery mostly musculoskeletal.  Plan:  On amlodipine-increase to 10 mg.  On maintenance do on 80 mg of simvastatin.  Tolerated se clopidogrel along with for both CAD and PAD.  Imdur 90 mg       Relevant Medications   amLODipine (NORVASC) 10 MG tablet   amLODipine (NORVASC) 2.5 MG tablet   nitroGLYCERIN (NITROSTAT) 0.4 MG SL tablet   Other Relevant Orders   EKG 12-Lead (Completed)   S/P CABG (coronary artery bypass graft): Initial CABGX3 1995 (LIMA-LAD, SVG-OM 2, SVG-RPDA) --> redo CABG x1 FreeLRad-LAD for a occluded LAD with atretic LIMA and failed attempted revascularization. (Chronic)   Relevant Orders   EKG 12-Lead (Completed)   Mixed hyperlipidemia (Chronic)    Lipids in a pretty stable.  LDL is always been in the 80s.  He is on simvastatin 80 mg, we have tried to switch him.  With him being on amlodipine, he needs to be switched to a different statin.  I have tried on several occasions.  Would like to switch to rosuvastatin 40 mg.      Relevant Medications   amLODipine (NORVASC) 10 MG tablet   amLODipine (NORVASC) 2.5 MG tablet   nitroGLYCERIN (NITROSTAT) 0.4 MG SL tablet   Essential hypertension, benign - Primary (Chronic)    His blood pressure is high today, but not as high as it  has been.  His recordings at home seem to be okay.  Plan is to increase amlodipine to 10 mg daily (he was actually taking it 5).  I recommend that he occasionally checks his blood pressure every so often if he has a headache or feels discomfort.  His systolic pressures greater than 170, takes a little nitroglycerin, still elevated after an hour take 2.5 mg amlodipine      Relevant Medications   amLODipine (NORVASC) 10 MG tablet   amLODipine (NORVASC) 2.5 MG tablet   nitroGLYCERIN (NITROSTAT) 0.4 MG SL tablet      ===================================  HPI:    [name] is a 87 y.o. male with a PMH notable for CAD having CABG (original 1995, redo 2001) w/ Chronic Stable Angina (and musculoskeletal chest pain since surgery), HTN, HLD, DM-2 who presents today for ER follow-up for headache and uncontrolled hypertension.[name]  [name] was last seen on 09/24/2019 as a preop evaluation for left eye surgery.  He had recently been started inhaler for some wheezing and coughing.  This was getting little better.  He still has occasional chest tightness with coughing and musculoskeletal chest discomfort.  Pain is noted along the left lateral midclavicular line.  He noted being very active, doing activity around the house.  Wakes up every morning, and is short of breath but nothing unusual.  Recent Hospitalizations:   4/7-08/2020: ER visit-admitted [facility]) for hypertensive urgency.  Accelerated  hypertension with headache and chest pain.  Ruled out for MI.  Toprol dose was cut in half (down to 12.5 mg, losartan 50 mg added.  Was on amlodipine 5 mg  Reviewed  CV studies:    The following studies were reviewed today: (if available, images/films reviewed: From Epic Chart or Care Everywhere)  . Echocardiogram 04/10/2020 ([facility]): Normal LV size and function.  EF 60 to 65%.  GR 1 DD.  Mild AI.  Mild to moderate LA dilation.  GR 1 DD.[name]   Interval History:   [name]  presents here today for follow-up of his hospitalization.  He actually has a list of his blood pressure log from April 13 through he has had a wide range of readings: Low 92/42 heart rate 48, high of 181/61 with heart rate of 57.  The 92/42 was an outlier mostly blood pressures being in the 150s and 160s.  Occasional blood pressure 118/50 and 122/82.  He says pretty much pressures been more stable since restarting his medications.  Is not quite clear as to what happened and why he is not correctly taking his medications prior to recent hospitalization.  Is having headaches per se more right eye pain.  He is due to see his ophthalmologist in the near future.  He says his eye is very light sensitive.  Besides having some headaches and some vision issues, he really is doing okay from a cardiac standpoint.  He says he always has chest pain.  Nothing has changed.  No palpitations.  He said some GERD symptoms associated with dyspnea but not necessarily any exertional chest pain.  Occasionally has headaches but nothing like he had severe.  No notable palpitations.  No PND or edema.  He is gradually starting to get back to his activity, but he has been limited by his eye pain.  CV Review of Symptoms (Summary) Cardiovascular ROS: positive for - chest pain and Shortness of breath associated with GERD.  Chest pain is chronic.  No change negative for - edema, irregular heartbeat, orthopnea, palpitations, paroxysmal nocturnal dyspnea, rapid heart rate or Syncope or near syncope or TIA/amaurosis fugax or claudication  The patient does not have symptoms concerning for COVID-19 infection (fever, chills, cough, or new shortness of breath).   REVIEWED OF SYSTEMS   Review of Systems  Constitutional: Positive for weight loss. Negative for malaise/fatigue (Does not have a lot of energy, but actually fatigued.).  Eyes: Positive for photophobia (Right is quite painful with light sensitivity.) and pain (Right).   Respiratory: Positive for shortness of breath (Associated with heartburn.).   Cardiovascular: Positive for chest pain (Chronic.  No change).  Gastrointestinal: Positive for heartburn.  Musculoskeletal: Positive for joint pain. Negative for falls.  Neurological: Positive for dizziness (Positional). Negative for focal weakness, weakness and headaches.  Psychiatric/Behavioral: Positive for memory loss. Negative for depression (Dysthymia). The patient is not nervous/anxious and does not have insomnia.     I have reviewed and (if needed) personally updated the patient's problem list, medications, allergies, past medical and surgical history, social and family history.   PAST MEDICAL HISTORY   Past Medical History:  Diagnosis Date  . Asymptomatic stenosis of left vertebral artery 2002, 2005   Status post PTA/stent with redo; normal antegrade flow on dopplers 09/2013  . CAD (coronary artery disease) 1995   1CABG x3; redo in CABG x 1 2001 Free Radial to LAD; All grafts patent by Cath 08/2014: The proximal to mid LAD has poor retrograde  filling from the LIMA due to in-stent restenosis. 50% ostial disease of free radial artery to the LAD.  [name] CAD (coronary artery disease) of artery bypass graft 2000   PCI x 2 - ostial & prox-mid LAD (BMS) for atretic LIMA-LAD  . CAD in native artery 1995   CABG x 3 - LIMA-LAD, SVG-OM2, SVG-rPDA  . Chest pain 09/2017  . DM (diabetes mellitus) type II controlled peripheral vascular disorder 2002   Left subclavian and left vertebral artery stenoses  . Glaucoma   . Hyperlipidemia LDL goal <70   . Hypertension, essential   . S/P CABG x 3 1995   . S/P Redo CABG x 1 2001   L Radial-LAD after 2 failed attempts @ LIMA-LAD PTCA; LAD stents 100% occluded  . Stenosis of left subclavian artery ([place]) 11/2003   Status post PTA/stent --> < 50 % stenosis by Dopplers 09/2013    PAST SURGICAL HISTORY   Past Surgical History:  Procedure Laterality Date  . CARDIAC  CATHETERIZATION N/A 08/04/2014   Procedure: Left Heart Cath and Cors/Grafts Angiography;  Surgeon: Leonie Man, MD;  Location: [facility];  Service: Cardiovascular: o-mRCA 70%, m-dRCA ~70% - SVG-dRCA Patent.  o-pLAD 100% stent occluded, mLAD 95% ISR with poor retrograde filling from freeRadiial graft-LAD (50% ostial graft dz).  o-p Cx 80%. Widely patent SVG-Cx-OM fills retrograde to 80% lesion.; Normal LV Fxn & EDP  . CORONARY ANGIOPLASTY  April and May 2001   After Both LAD stents occluded - PTCA of anastomatic LIMA-LAD lesion -- Unsuccessful.  . CORONARY ARTERY BYPASS GRAFT  1995    LIMA-LAD, SVG-OM2, SVG-rPDA  . CORONARY ARTERY BYPASS GRAFT  June 2001   Dr. Lucianne Lei Trigt: Redo LAD grafting with free Left Radial-distal LAD  . CORONARY STENT PLACEMENT  1995-2000   2 BMS stents to osital-proximal & proximal-mid LAD; because of atretic LIMA-LDA  . LEFT HEART CATH AND CORONARY ANGIOGRAPHY N/A 09/18/2017   Procedure: LEFT HEART CATH AND CORONARY ANGIOGRAPHY;  Surgeon: Martinique, Peter M, MD;  Location: [facility];  Service: Cardiovascular;  Laterality: N/A;  . LEFT HEART CATHETERIZATION WITH CORONARY/GRAFT ANGIOGRAM N/A 08/22/2011   Procedure: LEFT HEART CATHETERIZATION WITH Beatrix Fetters;  Surgeon: Leonie Man, MD;  Location: [facility] CATH LAB;  Service: Cardiovascular:  Known occluded LIMA-LAD and ostial LAD. Moderate to severe proximal circumflex and RCA disease. Widely patent freeLRAD-dLAD, as well as SVG-RPDA (backfilling RPL), SVG-OM 2 (backfilling OM1)  . SHOULDER OPEN ROTATOR CUFF REPAIR  October 2004   Dr. Noemi Chapel  . SP Ninfa Meeker OR THOR CAROTID STENT Left October 2002; November 2005   Left Vertebral stent placed in October 2002 (Dr. Patrecia Pour); redo PCI in 2005  . SUBCLAVIAN ARTERY STENT Left November 2005   Dr. Gwenlyn Found  . TRANSTHORACIC ECHOCARDIOGRAM  04/10/2020   [facility]): Normal LV size and function.  EF 60 to 65%.  GR 1 DD.  Mild AI.  Mild to moderate LA  dilation.  GR 1 DD..    Cath 09/2017:  Severe LM & 3V CAD. p-mRCA 80%, dRCA 50%. Ost-distal LM 90% - ost-prox LCx 90% & ost-prox LAD 100% (p-m LAD 100%).  3/3 patent Grafts: free Rad-LAD, SVG-OM1, SVG-dRCA. Normal LV Fxn & LVEDP.     Immunization History  Administered Date(s) Administered  . Influenza, High Dose Seasonal PF 09/17/2017  . Influenza-Unspecified 09/03/2013    MEDICATIONS/ALLERGIES   Current Meds  Medication Sig  . acetaminophen (TYLENOL) 500 MG tablet Take 1,000 mg by  mouth every 6 (six) hours as needed for mild pain or headache.  [name] acyclovir (ZOVIRAX) 400 MG tablet Take 400 mg by mouth 2 (two) times daily with a meal.   . amLODipine (NORVASC) 10 MG tablet Take 1 tablet (10 mg total) by mouth daily.  [name] aspirin EC 81 MG tablet Take 81 mg by mouth daily with breakfast.   . Brinzolamide-Brimonidine 1-0.2 % SUSP Place 1 drop into both eyes 3 (three) times daily.  . Cholecalciferol (VITAMIN D) 2000 units tablet Take 2,000 Units by mouth daily with breakfast.  . clopidogrel (PLAVIX) 75 MG tablet Take 1 tablet (75 mg total) by mouth daily.  [name] doxazosin (CARDURA) 8 MG tablet Take 8 mg by mouth at bedtime.   . fluticasone (FLONASE) 50 MCG/ACT nasal spray Place 1 spray into both nostrils daily as needed (seasonal allergies).   . gabapentin (NEURONTIN) 300 MG capsule Take 600 mg by mouth See admin instructions. Take two capsules (600 mg) by mouth three times daily - breakfast, supper and bedtime  . GLOBAL INJECT EASE LANCETS 30G MISC DIABETIC CLUB - USE TO CHECK BLOOD GLUCOSE LEVELS  . glucose blood test strip DIABETIC CLUB - USE TO CHECK BLOOD GLUCOSE LEVELS  . hydrocortisone cream 1 % Apply 1 application topically daily as needed for itching.  . isosorbide mononitrate (IMDUR) 60 MG 24 hr tablet May take 1 and 1/2 tablets to 2 tablets a day depending on if chest discomfort is present  . latanoprost (XALATAN) 0.005 % ophthalmic solution Place 1 drop into the right eye at bedtime.    [name] loratadine (CLARITIN) 10 MG tablet Take 10 mg by mouth daily as needed (seasonal allergies).   . nitroGLYCERIN (NITROSTAT) 0.4 MG SL tablet Place 1 tablet (0.4 mg total) under the tongue every 5 (five) minutes as needed for up to 25 days for chest pain. And increase blood pressure greater than 123XX123 systolic  . Omega-3 Fatty Acids (FISH OIL) 1000 MG CAPS Take 1,000 mg by mouth 2 (two) times daily with a meal.   . omeprazole (PRILOSEC) 20 MG capsule Take 20 mg by mouth daily.  . pantoprazole (PROTONIX) 40 MG tablet Take 40 mg by mouth daily with breakfast.  . prednisoLONE acetate (PRED FORTE) 1 % ophthalmic suspension Place 1 drop into the left eye 2 (two) times daily.  . simvastatin (ZOCOR) 80 MG tablet Take 40 mg by mouth at bedtime.   . timolol (TIMOPTIC) 0.5 % ophthalmic solution Place 1 drop into both eyes daily.   . TRULICITY [id number] 0000000 SOPN Inject into the skin.  [name] vitamin C (ASCORBIC ACID) 500 MG tablet Take 500 mg by mouth 2 (two) times daily with a meal.   . [DISCONTINUED] amLODipine (NORVASC) 2.5 MG tablet TAKE 1 TABLET BY MOUTH EVERY DAY    Allergies  Allergen Reactions  . Iohexol Other (See Comments)    Intractable shaking.  . Contrast Media [Iodinated Diagnostic Agents]     Shivering & shaking. Pt reports takes antihistamine prior to procedures.  . Metformin And Related Diarrhea    Subsequently discontinued    SOCIAL HISTORY/FAMILY HISTORY   Reviewed in Epic:  Pertinent findings:  Social History   Tobacco Use  . Smoking status: Former Smoker    Packs/day: 3.00    Years: 30.00    Pack years: 90.00    Types: Cigarettes  . Smokeless tobacco: Never Used  . Tobacco comment: quit smoking about 50 years ago  Vaping Use  . Vaping Use: Never  used  Substance Use Topics  . Alcohol use: No  . Drug use: No   Social History   Social History Narrative   He is married with one daughter. He has 2 grandchildren.   He does not smoke. He quit smoking in roughly 1970 after  smoking 3 packs per day.   He is routinely at give him. It does not necessarily do routine exercise.     OBJCTIVE -PE, EKG, labs   Wt Readings from Last 3 Encounters:  04/30/20 131 lb (59.4 kg)  10/02/19 137 lb (62.1 kg)  05/08/19 142 lb (64.4 kg)    Physical Exam: BP (!) 150/68   Pulse 77   Ht '5\' 7"'$  (1.702 m)   Wt 131 lb (59.4 kg)   SpO2 98%   BMI 20.52 kg/m  Physical Exam Vitals reviewed.  Constitutional:      General: He is not in acute distress.    Appearance: Normal appearance. He is not toxic-appearing.     Comments: Continues to lose weight.  HENT:     Head: Normocephalic and atraumatic.  Neck:     Vascular: Carotid bruit (Soft bilateral carotid bruits) present. No JVD.  Cardiovascular:     Rate and Rhythm: Normal rate and regular rhythm.  No extrasystoles are present.    Pulses: Normal pulses and intact distal pulses.     Heart sounds: S1 normal and S2 normal. Heart sounds are distant. No friction rub. No gallop.   Pulmonary:     Effort: Pulmonary effort is normal. No respiratory distress.     Breath sounds: Normal breath sounds. No stridor. No wheezing or rales.  Chest:     Chest wall: Tenderness (Brabham manubrium and left lateral sternal border) present.  Musculoskeletal:        General: No swelling. Normal range of motion.     Cervical back: Normal range of motion.  Skin:    General: Skin is warm and dry.  Neurological:     General: No focal deficit present.     Mental Status: He is alert and oriented to person, place, and time.  Psychiatric:        Mood and Affect: Mood normal.        Behavior: Behavior normal.        Thought Content: Thought content normal.        Judgment: Judgment normal.     Comments: Memory loss     Adult ECG Report  Rate: 77 ;  Rhythm: normal sinus rhythm, premature ventricular contractions (PVC) and LVH with hospitalization abnormalities.  Otherwise normal axis, intervals, and durations.;   Narrative Interpretation:  Stable  Recent Labs:    10/04/2019: TC 135, TG 105, HDL 31 LDL 84.  Hgb 31 8.  01/03/2020: A1c 10.1. Cr 1.06.  ==================================================  COVID-19 Education: The signs and symptoms of COVID-19 were discussed with the patient and how to seek care for testing (follow up with PCP or arrange E-visit).   The importance of social distancing and COVID-19 vaccination was discussed today. The patient is practicing social distancing & Masking.   I spent a total of 32 minutes with the patient spent in direct patient consultation.  Additional time spent with chart review  / charting (studies, outside notes, etc): 10 min Total Time: 42 min  Current medicines are reviewed at length with the patient today.  (+/- concerns) n/a  This visit occurred during the SARS-CoV-2 public health emergency.  Safety protocols were in place, including screening  questions prior to the visit, additional usage of staff PPE, and extensive cleaning of exam room while observing appropriate contact time as indicated for disinfecting solutions.  Notice: This dictation was prepared with Dragon dictation along with smaller phrase technology. Any transcriptional errors that result from this process are unintentional and may not be corrected upon review.  To be get patient Instructions / Medication Changes & Studies & Tests Ordered   Patient Instructions  Medication Instructions:    take Amlodipine 10 mg daily only Along with all other medications  Nitroglycerin tablets refilled   check your blood pressure periodically and if you  Have headache  Or chest dsicomfort or not feeling  Well. If blood pressure systolic is 123XX123 or higher , rest for one hour and recheck if still elevated take nitroglycerin tablet under your toungue - recheck again in about hour if it still elevated  Take 2.5 mg Amlodipine tablet.     *If you need a refill on your cardiac medications before your next appointment, please  call your pharmacy*   Lab Work: Not needed If you have labs (blood work) drawn today and your tests are completely normal, you will receive your results only by: [name] MyChart Message (if you have MyChart) OR . A paper copy in the mail If you have any lab test that is abnormal or we need to change your treatment, we will call you to review the results.   Testing/Procedures: Not needed   Follow-Up: At [facility], you and your health needs are our priority.  As part of our continuing mission to provide you with exceptional heart care, we have created designated Provider Care Teams.  These Care Teams include your primary Cardiologist (physician) and Advanced Practice Providers (APPs -  Physician Assistants and Nurse Practitioners) who all work together to provide you with the care you need, when you need it.  We recommend signing up for the patient portal called "MyChart".  Sign up information is provided on this After Visit Summary.  MyChart is used to connect with patients for Virtual Visits (Telemedicine).  Patients are able to view lab/test results, encounter notes, upcoming appointments, etc.  Non-urgent messages can be sent to your provider as well.   To learn more about what you can do with MyChart, go to [email or web address].    Your next appointment:   3 month(s)  The format for your next appointment:   In Person  Provider:    the following Advanced Practice Providers on your designated Care Team:    Rosaria Ferries, PA-C  Jory Sims, DNP, ANP     that you schedule a follow-up appointment with Glenetta Hew, MD in 6 months      Studies Ordered:   Orders Placed This Encounter  Procedures  . EKG 12-Lead     Glenetta Hew, M.D., M.S. Interventional Cardiologist   Pager # [phone] Phone # [phone] [address] [place], [place] [address]   Thank you for choosing Heartcare at [facility]!!

## 2020-04-30 NOTE — Patient Instructions (Addendum)
Medication Instructions:    take Amlodipine 10 mg daily only Along with all other medications  Nitroglycerin tablets refilled   check your blood pressure periodically and if you  Have headache  Or chest dsicomfort or not feeling  Well. If blood pressure systolic is 123XX123 or higher , rest for one hour and recheck if still elevated take nitroglycerin tablet under your toungue - recheck again in about hour if it still elevated  Take 2.5 mg Amlodipine tablet.     *If you need a refill on your cardiac medications before your next appointment, please call your pharmacy*   Lab Work: Not needed If you have labs (blood work) drawn today and your tests are completely normal, you will receive your results only by: [name] MyChart Message (if you have MyChart) OR . A paper copy in the mail If you have any lab test that is abnormal or we need to change your treatment, we will call you to review the results.   Testing/Procedures: Not needed   Follow-Up: At [facility], you and your health needs are our priority.  As part of our continuing mission to provide you with exceptional heart care, we have created designated Provider Care Teams.  These Care Teams include your primary Cardiologist (physician) and Advanced Practice Providers (APPs -  Physician Assistants and Nurse Practitioners) who all work together to provide you with the care you need, when you need it.  We recommend signing up for the patient portal called "MyChart".  Sign up information is provided on this After Visit Summary.  MyChart is used to connect with patients for Virtual Visits (Telemedicine).  Patients are able to view lab/test results, encounter notes, upcoming appointments, etc.  Non-urgent messages can be sent to your provider as well.   To learn more about what you can do with MyChart, go to [email or web address].    Your next appointment:   3 month(s)  The format for your next appointment:   In Person  Provider:     the following Advanced Practice Providers on your designated Care Team:    Rosaria Ferries, PA-C  Jory Sims, DNP, ANP     that you schedule a follow-up appointment with Glenetta Hew, MD in 6 months

## 2020-05-01 ENCOUNTER — Telehealth: Payer: Self-pay | Admitting: Cardiology

## 2020-05-01 NOTE — Telephone Encounter (Signed)
Left message for patient to call and schedule 2 mos follow up with a PA per Dr. Allison Quarry 04/30/20 visit

## 2020-05-02 DIAGNOSIS — E1165 Type 2 diabetes mellitus with hyperglycemia: Secondary | ICD-10-CM | POA: Diagnosis not present

## 2020-05-02 DIAGNOSIS — I10 Essential (primary) hypertension: Secondary | ICD-10-CM | POA: Diagnosis not present

## 2020-05-02 DIAGNOSIS — Z794 Long term (current) use of insulin: Secondary | ICD-10-CM | POA: Diagnosis not present

## 2020-05-02 DIAGNOSIS — I482 Chronic atrial fibrillation, unspecified: Secondary | ICD-10-CM | POA: Diagnosis not present

## 2020-05-10 ENCOUNTER — Encounter: Payer: Self-pay | Admitting: Cardiology

## 2020-05-10 DIAGNOSIS — I25708 Atherosclerosis of coronary artery bypass graft(s), unspecified, with other forms of angina pectoris: Secondary | ICD-10-CM | POA: Diagnosis not present

## 2020-05-10 DIAGNOSIS — I10 Essential (primary) hypertension: Secondary | ICD-10-CM | POA: Diagnosis not present

## 2020-05-10 NOTE — Assessment & Plan Note (Signed)
Continued gradual weight loss.  Encouraged increased on/improved nutrition.

## 2020-05-10 NOTE — Assessment & Plan Note (Signed)
Lipids in a pretty stable.  LDL is always been in the 80s.  He is on simvastatin 80 mg, we have tried to switch him.  With him being on amlodipine, he needs to be switched to a different statin.  I have tried on several occasions.  Would like to switch to rosuvastatin 40 mg.

## 2020-05-10 NOTE — Assessment & Plan Note (Signed)
His blood pressure is high today, but not as high as it has been.  His recordings at home seem to be okay.  Plan is to increase amlodipine to 10 mg daily (he was actually taking it 5).  I recommend that he occasionally checks his blood pressure every so often if he has a headache or feels discomfort.  His systolic pressures greater than 170, takes a little nitroglycerin, still elevated after an hour take 2.5 mg amlodipine

## 2020-05-10 NOTE — Assessment & Plan Note (Signed)
As per last cath in September 2019, all grafts were patent, all native coronary arteries were occluded.  Chronic chest pain presents is reading surgery mostly musculoskeletal.  Plan:  On amlodipine-increase to 10 mg.  On maintenance do on 80 mg of simvastatin.  Tolerated se clopidogrel along with for both CAD and PAD.  Imdur 90 mg

## 2020-05-22 ENCOUNTER — Telehealth: Payer: Self-pay | Admitting: *Deleted

## 2020-05-22 NOTE — Telephone Encounter (Signed)
  Per Dr Sharlyn Bologna, I keep trying to switch him from simvastatin to rosuvastatin.   May change his prescription for next refill to rosuvastatin 40 mg. Just medial abnormal changing it. Interaction with higher dose of amlodipine continue with switch s off of simvastatin.   DH     Spoke to patient. Patient states he receive medication from the [facility] [place] in [place].  RN obtained phone number and fax - to send a prescription for Rosuvastatin  40 mg daily.   tele # [phone],  fax # [phone]  Patient is aware will contact when medication will be sent

## 2020-05-26 ENCOUNTER — Telehealth: Payer: Self-pay | Admitting: Cardiology

## 2020-05-26 MED ORDER — ROSUVASTATIN CALCIUM 40 MG PO TABS: 40.0000 mg | ORAL_TABLET | Freq: Every day | ORAL | 3 refills | Status: DC

## 2020-05-26 NOTE — Telephone Encounter (Signed)
Pt c/o medication issue: 1. Name of Medication: simvastatin and atorvastatin  2. How are you currently taking this medication (dosage and times per day)?  3. Are you having a reaction (difficulty breathing--STAT)?  No  4. What is your medication issue? [facility] has questions

## 2020-05-26 NOTE — Telephone Encounter (Signed)
Spoke with pt pharmacy, she reports she looked over all of the patients medication bottles and he has simvastatin and atorvastatin that he got from the [facility] [place]. According to dr harding, he is to stop simvastatin and atorvastatin and start rosuvastatin 40 mg once daily. She is also asking about metoprolol and losartan that we do not have on our med list and she reports he is not taking them. Aware he will continue off those meds. She is going to contact the patient about his medications.

## 2020-05-27 DIAGNOSIS — Z1389 Encounter for screening for other disorder: Secondary | ICD-10-CM | POA: Diagnosis not present

## 2020-05-27 DIAGNOSIS — E1122 Type 2 diabetes mellitus with diabetic chronic kidney disease: Secondary | ICD-10-CM | POA: Diagnosis not present

## 2020-05-27 DIAGNOSIS — E1165 Type 2 diabetes mellitus with hyperglycemia: Secondary | ICD-10-CM | POA: Diagnosis not present

## 2020-06-01 DIAGNOSIS — E7849 Other hyperlipidemia: Secondary | ICD-10-CM | POA: Diagnosis not present

## 2020-06-01 DIAGNOSIS — N184 Chronic kidney disease, stage 4 (severe): Secondary | ICD-10-CM | POA: Diagnosis not present

## 2020-06-01 DIAGNOSIS — E1122 Type 2 diabetes mellitus with diabetic chronic kidney disease: Secondary | ICD-10-CM | POA: Diagnosis not present

## 2020-06-01 DIAGNOSIS — I129 Hypertensive chronic kidney disease with stage 1 through stage 4 chronic kidney disease, or unspecified chronic kidney disease: Secondary | ICD-10-CM | POA: Diagnosis not present

## 2020-06-23 DIAGNOSIS — E1165 Type 2 diabetes mellitus with hyperglycemia: Secondary | ICD-10-CM | POA: Diagnosis not present

## 2020-06-23 DIAGNOSIS — Z6820 Body mass index (BMI) 20.0-20.9, adult: Secondary | ICD-10-CM | POA: Diagnosis not present

## 2020-06-26 ENCOUNTER — Ambulatory Visit: Payer: Medicare Other | Admitting: Physician Assistant

## 2020-06-26 ENCOUNTER — Other Ambulatory Visit: Payer: Self-pay

## 2020-06-26 ENCOUNTER — Ambulatory Visit: Payer: Medicare Other | Admitting: Adult Health

## 2020-06-26 ENCOUNTER — Encounter: Payer: Self-pay | Admitting: Physician Assistant

## 2020-06-26 VITALS — BP 118/58 | HR 77 | Ht 67.0 in | Wt 125.0 lb

## 2020-06-26 DIAGNOSIS — I10 Essential (primary) hypertension: Secondary | ICD-10-CM

## 2020-06-26 DIAGNOSIS — E119 Type 2 diabetes mellitus without complications: Secondary | ICD-10-CM

## 2020-06-26 DIAGNOSIS — I25810 Atherosclerosis of coronary artery bypass graft(s) without angina pectoris: Secondary | ICD-10-CM | POA: Diagnosis not present

## 2020-06-26 DIAGNOSIS — E785 Hyperlipidemia, unspecified: Secondary | ICD-10-CM | POA: Diagnosis not present

## 2020-06-26 NOTE — Patient Instructions (Signed)
Medication Instructions:  Your physician recommends that you continue on your current medications as directed. Please refer to the Current Medication list given to you today.  *If you need a refill on your cardiac medications before your next appointment, please call your pharmacy*  Lab Work: NONE ordered at this time of appointment   If you have labs (blood work) drawn today and your tests are completely normal, you will receive your results only by: [place] (if you have MyChart) OR A paper copy in the mail If you have any lab test that is abnormal or we need to change your treatment, we will call you to review the results.  Testing/Procedures: NONE ordered at this time of appointment   Follow-Up: At [facility], you and your health needs are our priority.  As part of our continuing mission to provide you with exceptional heart care, we have created designated Provider Care Teams.  These Care Teams include your primary Cardiologist (physician) and Advanced Practice Providers (APPs -  Physician Assistants and Nurse Practitioners) who all work together to provide you with the care you need, when you need it.  We recommend signing up for the patient portal called "MyChart".  Sign up information is provided on this After Visit Summary.  MyChart is used to connect with patients for Virtual Visits (Telemedicine).  Patients are able to view lab/test results, encounter notes, upcoming appointments, etc.  Non-urgent messages can be sent to your provider as well.   To learn more about what you can do with MyChart, go to [email or web address].    Your next appointment:   4 month(s)  The format for your next appointment:   In Person  Provider:   Glenetta Hew, MD  Other Instructions

## 2020-06-26 NOTE — Progress Notes (Signed)
Cardiology Office Note:    Date:  06/28/2020   ID:  [name], DOB [birth year], MRN [id number]  PCP:  Rory Percy, MD   [facility] Providers Cardiologist:  Glenetta Hew, MD     Referring MD: Rory Percy, MD   Chief Complaint  Patient presents with   Follow-up    Seen for Dr. Ellyn Hack    History of Present Illness:    [name] is a 87 y.o. male with a hx of CAD with CABG x3 then redo CABG x1 month left radial to distal LAD after LIMA to LAD became atretic, hypertension, hyperlipidemia, DM 2, and history of left subclavian artery stenosis treated with stent in 2005.  He is original bypass surgery was in 1995, redo surgery was in 2001.  He has chronic stable angina and musculoskeletal chest pain since surgery.  Patient was seen at [facility] ED in April 2022 for hypertensive urgency.  He was treated for hypertension with headache and chest pain.  He was ruled out of MI.  Toprol-XL was cut in half, losartan 50 mg was added.  He was also on 5 mg daily of amlodipine, amlodipine was further uptitrated on the last follow-up on 04/30/2020.  Echocardiogram obtained on 04/10/2020 at [facility] showed a normal LV size and function, EF 60 to 65%, grade 1 DD, mild AI, mild to moderate LAE, grade 1 DD.  Patient presents today for follow-up.  Although previous note mentions he was started on metoprolol succinate 12.5 mg daily and a 50 mg daily of losartan, neither medication ever made it to his medication list here.  This has caused some confusion with the patient, based on phone note on 05/26/2020, we have instructed the patient to stop both medications.  Currently his blood pressure medication consist of increased amlodipine of 10 mg daily and also 90 mg daily of Imdur.  Surprisingly his blood pressure is very well controlled on this combination.  Initial blood pressure on arrival was 118/58.  When I manually rechecked his blood pressure, it was 126/54.  At home, he says his blood  pressure can occasionally go up to 140s and rarely in the 150s.  He has chronic chest pain that is worse with deep inspiration.  I recommended continue on the current therapy and follow-up with Dr. Ellyn Hack in 4 months.   Past Medical History:  Diagnosis Date   Asymptomatic stenosis of left vertebral artery 2002, 2005   Status post PTA/stent with redo; normal antegrade flow on dopplers 09/2013   CAD (coronary artery disease) 1995   1CABG x3; redo in CABG x 1 2001 Free Radial to LAD; All grafts patent by Cath 08/2014: The proximal to mid LAD has poor retrograde filling from the LIMA due to in-stent restenosis. 50% ostial disease of free radial artery to the LAD.   CAD (coronary artery disease) of artery bypass graft 2000   PCI x 2 - ostial & prox-mid LAD (BMS) for atretic LIMA-LAD   CAD in native artery 1995   CABG x 3 - LIMA-LAD, SVG-OM2, SVG-rPDA   Chest pain 09/2017   DM (diabetes mellitus) type II controlled peripheral vascular disorder 2002   Left subclavian and left vertebral artery stenoses   Glaucoma    Hyperlipidemia LDL goal <70    Hypertension, essential    S/P CABG x 3 1995    S/P Redo CABG x 1 2001   L Radial-LAD after 2 failed attempts @ LIMA-LAD PTCA; LAD stents 100% occluded  Stenosis of left subclavian artery ([place]) 11/2003   Status post PTA/stent --> < 50 % stenosis by Dopplers 09/2013    Past Surgical History:  Procedure Laterality Date   CARDIAC CATHETERIZATION N/A 08/04/2014   Procedure: Left Heart Cath and Cors/Grafts Angiography;  Surgeon: Leonie Man, MD;  Location: [place] [facility];  Service: Cardiovascular: o-mRCA 70%, m-dRCA ~70% - SVG-dRCA Patent.  o-pLAD 100% stent occluded, mLAD 95% ISR with poor retrograde filling from freeRadiial graft-LAD (50% ostial graft dz).  o-p Cx 80%. Widely patent SVG-Cx-OM fills retrograde to 80% lesion.; Normal LV Fxn & EDP   CORONARY ANGIOPLASTY  April and May 2001   After Both LAD stents occluded - PTCA of anastomatic LIMA-LAD  lesion -- Unsuccessful.   CORONARY ARTERY BYPASS GRAFT  1995    LIMA-LAD, SVG-OM2, SVG-rPDA   CORONARY ARTERY BYPASS GRAFT  June 2001   Dr. Lucianne Lei Trigt: Redo LAD grafting with free Left Radial-distal LAD   CORONARY STENT PLACEMENT  1995-2000   2 BMS stents to osital-proximal & proximal-mid LAD; because of atretic LIMA-LDA   LEFT HEART CATH AND CORONARY ANGIOGRAPHY N/A 09/18/2017   Procedure: LEFT HEART CATH AND CORONARY ANGIOGRAPHY;  Surgeon: Martinique, Peter M, MD;  Location: [place] [facility];  Service: Cardiovascular;  Laterality: N/A;   LEFT HEART CATHETERIZATION WITH CORONARY/GRAFT ANGIOGRAM N/A 08/22/2011   Procedure: LEFT HEART CATHETERIZATION WITH Beatrix Fetters;  Surgeon: Leonie Man, MD;  Location: [facility] CATH LAB;  Service: Cardiovascular:  Known occluded LIMA-LAD and ostial LAD. Moderate to severe proximal circumflex and RCA disease. Widely patent freeLRAD-dLAD, as well as SVG-RPDA (backfilling RPL), SVG-OM 2 (backfilling OM1)   SHOULDER OPEN ROTATOR CUFF REPAIR  October 2004   Dr. Noemi Chapel   [facility] [place] VERT OR THOR CAROTID STENT Left October 2002; November 2005   Left Vertebral stent placed in October 2002 (Dr. Patrecia Pour); redo PCI in 2005   [place] Left November 2005   Dr. Gwenlyn Found   TRANSTHORACIC ECHOCARDIOGRAM  04/10/2020   [facility]): Normal LV size and function.  EF 60 to 65%.  GR 1 DD.  Mild AI.  Mild to moderate LA dilation.  GR 1 DD..    Current Medications: Current Meds  Medication Sig   acetaminophen (TYLENOL) 500 MG tablet Take 1,000 mg by mouth every 6 (six) hours as needed for mild pain or headache.   acyclovir (ZOVIRAX) 400 MG tablet Take 400 mg by mouth 2 (two) times daily with a meal.    albuterol (VENTOLIN HFA) 108 (90 Base) MCG/ACT inhaler INHALE TWO PUFFS BY MOUTH FOUR TIMES DAILY   amLODipine (NORVASC) 10 MG tablet Take 1 tablet (10 mg total) by mouth daily.   amLODipine (NORVASC) 2.5 MG tablet Take 1 tablet (2.5 mg total) by mouth as  needed. For blood pressure greater than 123XX123 systolic   aspirin EC 81 MG tablet Take 81 mg by mouth daily with breakfast.    Brinzolamide-Brimonidine 1-0.2 % SUSP Place 1 drop into both eyes 3 (three) times daily.   Cholecalciferol (VITAMIN D) 2000 units tablet Take 2,000 Units by mouth daily with breakfast.   clopidogrel (PLAVIX) 75 MG tablet Take 1 tablet (75 mg total) by mouth daily.   doxazosin (CARDURA) 8 MG tablet Take 8 mg by mouth at bedtime.    fluticasone (FLONASE) 50 MCG/ACT nasal spray Place 1 spray into both nostrils daily as needed (seasonal allergies).    gabapentin (NEURONTIN) 300 MG capsule Take 600 mg by mouth See admin instructions.  Take two capsules (600 mg) by mouth three times daily - breakfast, supper and bedtime   GLOBAL INJECT EASE LANCETS 30G MISC DIABETIC CLUB - USE TO CHECK BLOOD GLUCOSE LEVELS   glucose blood test strip DIABETIC CLUB - USE TO CHECK BLOOD GLUCOSE LEVELS   hydrocortisone cream 1 % Apply 1 application topically daily as needed for itching.   isosorbide mononitrate (IMDUR) 60 MG 24 hr tablet May take 1 and 1/2 tablets to 2 tablets a day depending on if chest discomfort is present   latanoprost (XALATAN) 0.005 % ophthalmic solution Place 1 drop into the right eye at bedtime.    loratadine (CLARITIN) 10 MG tablet Take 10 mg by mouth daily as needed (seasonal allergies).    metoprolol succinate (TOPROL-XL) 25 MG 24 hr tablet Take 0.5 tablets by mouth daily.   nitroGLYCERIN (NITROSTAT) 0.4 MG SL tablet Place 1 tablet (0.4 mg total) under the tongue every 5 (five) minutes as needed for up to 25 days for chest pain. And increase blood pressure greater than 123XX123 systolic   Omega-3 Fatty Acids (FISH OIL) 1000 MG CAPS Take 1,000 mg by mouth 2 (two) times daily with a meal.    omeprazole (PRILOSEC) 20 MG capsule Take 20 mg by mouth daily.   pantoprazole (PROTONIX) 40 MG tablet Take 40 mg by mouth daily with breakfast.   prednisoLONE acetate (PRED FORTE) 1 % ophthalmic  suspension Place 1 drop into the left eye 2 (two) times daily.   rosuvastatin (CRESTOR) 40 MG tablet Take 1 tablet (40 mg total) by mouth daily.   timolol (TIMOPTIC) 0.5 % ophthalmic solution Place 1 drop into both eyes daily.    TRULICITY [id number] 0000000 SOPN Inject into the skin.   vitamin C (ASCORBIC ACID) 500 MG tablet Take 500 mg by mouth 2 (two) times daily with a meal.      Allergies:   Iohexol, Contrast media [iodinated diagnostic agents], and Metformin and related   Social History   Socioeconomic History   Marital status: Married    Spouse name: Not on file   Number of children: Not on file   Years of education: Not on file   Highest education level: Not on file  Occupational History   Not on file  Tobacco Use   Smoking status: Former    Packs/day: 3.00    Years: 30.00    Pack years: 90.00    Types: Cigarettes   Smokeless tobacco: Never   Tobacco comments:    quit smoking about 50 years ago  Vaping Use   Vaping Use: Never used  Substance and Sexual Activity   Alcohol use: No   Drug use: No   Sexual activity: Not Currently  Other Topics Concern   Not on file  Social History Narrative   He is married with one daughter. He has 2 grandchildren.   He does not smoke. He quit smoking in roughly 1970 after smoking 3 packs per day.   He is routinely at give him. It does not necessarily do routine exercise.    Social Determinants of Health   Financial Resource Strain: Not on file  Food Insecurity: Not on file  Transportation Needs: Not on file  Physical Activity: Not on file  Stress: Not on file  Social Connections: Not on file     Family History: The patient's family history includes Diabetes in his brother, mother, and sister.  ROS:   Please see the history of present illness.  All other systems reviewed and are negative.  EKGs/Labs/Other Studies Reviewed:    The following studies were reviewed today:  Echo 08/03/2014 LV EF: 60% -   65%    -------------------------------------------------------------------  Indications:      Chest pain 786.51.   -------------------------------------------------------------------  Study Conclusions   - Left ventricle: The cavity size was normal. There was mild focal    basal hypertrophy of the septum. Systolic function was normal.    The estimated ejection fraction was in the range of 60% to 65%.    Wall motion was normal; there were no regional wall motion    abnormalities.  - Aortic valve: There was trivial regurgitation.  - Left atrium: The atrium was mildly dilated.  - Atrial septum: No defect or patent foramen ovale was identified.    EKG:  EKG is not ordered today.    Recent Labs: No results found for requested labs within last 8760 hours.  Recent Lipid Panel    Component Value Date/Time   CHOL 131 09/18/2017 0543   TRIG 110 09/18/2017 0543   HDL 29 (L) 09/18/2017 0543   CHOLHDL 4.5 09/18/2017 0543   VLDL 22 09/18/2017 0543   LDLCALC 80 09/18/2017 0543     Risk Assessment/Calculations:           Physical Exam:    VS:  BP (!) 118/58 (BP Location: Right Arm, Patient Position: Sitting, Cuff Size: Normal)   Pulse 77   Ht '5\' 7"'$  (1.702 m)   Wt 125 lb (56.7 kg)   SpO2 98%   BMI 19.58 kg/m     Wt Readings from Last 3 Encounters:  06/26/20 125 lb (56.7 kg)  04/30/20 131 lb (59.4 kg)  10/02/19 137 lb (62.1 kg)     GEN:  Well nourished, well developed in no acute distress HEENT: Normal NECK: No JVD; No carotid bruits LYMPHATICS: No lymphadenopathy CARDIAC: RRR, no murmurs, rubs, gallops RESPIRATORY:  Clear to auscultation without rales, wheezing or rhonchi  ABDOMEN: Soft, non-tender, non-distended MUSCULOSKELETAL:  No edema; No deformity  SKIN: Warm and dry NEUROLOGIC:  Alert and oriented x 3 PSYCHIATRIC:  Normal affect   ASSESSMENT:    1. Coronary artery disease involving coronary bypass graft of native heart without angina pectoris   2. Essential  hypertension   3. Hyperlipidemia with target LDL less than 70   4. Controlled type 2 diabetes mellitus without complication, without long-term current use of insulin (HCC)    PLAN:    In order of problems listed above:  CAD s/p CABG: Occasionally atypical chest pain that is worse with deep inspiration, this is likely musculoskeletal pain.  We will hold off on additional work-up at this time.   Hypertension: He recently went to [facility] and was prescribed low-dose metoprolol and losartan, however neither medication admitted to our medication list and has been discontinued based on the previous phone call on 5/24.  His amlodipine was increased to 10 mg daily.  He is also on 60 mg daily of Imdur.  His blood pressure is presently very well controlled on just these 2 medications.  Hyperlipidemia: Continue Crestor  DM2: Managed by primary care provider         Medication Adjustments/Labs and Tests Ordered: Current medicines are reviewed at length with the patient today.  Concerns regarding medicines are outlined above.  No orders of the defined types were placed in this encounter.  No orders of the defined types were placed in this encounter.  Patient Instructions  Medication Instructions:  Your physician recommends that you continue on your current medications as directed. Please refer to the Current Medication list given to you today.  *If you need a refill on your cardiac medications before your next appointment, please call your pharmacy*  Lab Work: NONE ordered at this time of appointment   If you have labs (blood work) drawn today and your tests are completely normal, you will receive your results only by: [place] (if you have MyChart) OR A paper copy in the mail If you have any lab test that is abnormal or we need to change your treatment, we will call you to review the results.  Testing/Procedures: NONE ordered at this time of appointment   Follow-Up: At  [facility], you and your health needs are our priority.  As part of our continuing mission to provide you with exceptional heart care, we have created designated Provider Care Teams.  These Care Teams include your primary Cardiologist (physician) and Advanced Practice Providers (APPs -  Physician Assistants and Nurse Practitioners) who all work together to provide you with the care you need, when you need it.  We recommend signing up for the patient portal called "MyChart".  Sign up information is provided on this After Visit Summary.  MyChart is used to connect with patients for Virtual Visits (Telemedicine).  Patients are able to view lab/test results, encounter notes, upcoming appointments, etc.  Non-urgent messages can be sent to your provider as well.   To learn more about what you can do with MyChart, go to [email or web address].    Your next appointment:   4 month(s)  The format for your next appointment:   In Person  Provider:   Glenetta Hew, MD  Other Instructions    Signed, Almyra Deforest, [place]  06/28/2020 10:25 PM    [place]

## 2020-06-28 ENCOUNTER — Encounter: Payer: Self-pay | Admitting: Physician Assistant

## 2020-07-02 DIAGNOSIS — E1165 Type 2 diabetes mellitus with hyperglycemia: Secondary | ICD-10-CM | POA: Diagnosis not present

## 2020-07-02 DIAGNOSIS — I482 Chronic atrial fibrillation, unspecified: Secondary | ICD-10-CM | POA: Diagnosis not present

## 2020-07-02 DIAGNOSIS — I10 Essential (primary) hypertension: Secondary | ICD-10-CM | POA: Diagnosis not present

## 2020-07-02 DIAGNOSIS — Z794 Long term (current) use of insulin: Secondary | ICD-10-CM | POA: Diagnosis not present

## 2020-07-25 DIAGNOSIS — E1142 Type 2 diabetes mellitus with diabetic polyneuropathy: Secondary | ICD-10-CM | POA: Diagnosis not present

## 2020-07-25 DIAGNOSIS — Z955 Presence of coronary angioplasty implant and graft: Secondary | ICD-10-CM | POA: Diagnosis not present

## 2020-07-25 DIAGNOSIS — Z951 Presence of aortocoronary bypass graft: Secondary | ICD-10-CM | POA: Diagnosis not present

## 2020-07-25 DIAGNOSIS — R079 Chest pain, unspecified: Secondary | ICD-10-CM | POA: Diagnosis not present

## 2020-07-25 DIAGNOSIS — I10 Essential (primary) hypertension: Secondary | ICD-10-CM | POA: Diagnosis not present

## 2020-07-25 DIAGNOSIS — E1151 Type 2 diabetes mellitus with diabetic peripheral angiopathy without gangrene: Secondary | ICD-10-CM | POA: Diagnosis not present

## 2020-07-25 DIAGNOSIS — Z87891 Personal history of nicotine dependence: Secondary | ICD-10-CM | POA: Diagnosis not present

## 2020-07-25 DIAGNOSIS — E1165 Type 2 diabetes mellitus with hyperglycemia: Secondary | ICD-10-CM | POA: Diagnosis not present

## 2020-07-25 DIAGNOSIS — R739 Hyperglycemia, unspecified: Secondary | ICD-10-CM | POA: Diagnosis not present

## 2020-07-25 DIAGNOSIS — E785 Hyperlipidemia, unspecified: Secondary | ICD-10-CM | POA: Diagnosis not present

## 2020-07-25 DIAGNOSIS — I2510 Atherosclerotic heart disease of native coronary artery without angina pectoris: Secondary | ICD-10-CM | POA: Diagnosis not present

## 2020-07-27 DIAGNOSIS — E1165 Type 2 diabetes mellitus with hyperglycemia: Secondary | ICD-10-CM | POA: Diagnosis not present

## 2020-08-02 DIAGNOSIS — E1165 Type 2 diabetes mellitus with hyperglycemia: Secondary | ICD-10-CM | POA: Diagnosis not present

## 2020-08-02 DIAGNOSIS — I482 Chronic atrial fibrillation, unspecified: Secondary | ICD-10-CM | POA: Diagnosis not present

## 2020-08-02 DIAGNOSIS — Z794 Long term (current) use of insulin: Secondary | ICD-10-CM | POA: Diagnosis not present

## 2020-08-02 DIAGNOSIS — I10 Essential (primary) hypertension: Secondary | ICD-10-CM | POA: Diagnosis not present

## 2020-09-02 DIAGNOSIS — E1165 Type 2 diabetes mellitus with hyperglycemia: Secondary | ICD-10-CM | POA: Diagnosis not present

## 2020-09-02 DIAGNOSIS — I482 Chronic atrial fibrillation, unspecified: Secondary | ICD-10-CM | POA: Diagnosis not present

## 2020-09-02 DIAGNOSIS — Z794 Long term (current) use of insulin: Secondary | ICD-10-CM | POA: Diagnosis not present

## 2020-09-02 DIAGNOSIS — I10 Essential (primary) hypertension: Secondary | ICD-10-CM | POA: Diagnosis not present

## 2020-10-14 ENCOUNTER — Ambulatory Visit (HOSPITAL_COMMUNITY)
Admission: RE | Admit: 2020-10-14 | Discharge: 2020-10-14 | Disposition: A | Discharge status: Home / Self Care | Payer: Medicare Other | Source: Ambulatory Visit | Attending: Cardiology | Admitting: Cardiology

## 2020-10-14 ENCOUNTER — Other Ambulatory Visit: Payer: Self-pay

## 2020-10-14 DIAGNOSIS — I6523 Occlusion and stenosis of bilateral carotid arteries: Secondary | ICD-10-CM

## 2020-10-19 ENCOUNTER — Other Ambulatory Visit: Payer: Self-pay | Admitting: *Deleted

## 2020-10-19 DIAGNOSIS — E119 Type 2 diabetes mellitus without complications: Secondary | ICD-10-CM

## 2020-10-19 DIAGNOSIS — E785 Hyperlipidemia, unspecified: Secondary | ICD-10-CM

## 2020-10-19 DIAGNOSIS — I6523 Occlusion and stenosis of bilateral carotid arteries: Secondary | ICD-10-CM

## 2020-10-19 DIAGNOSIS — I25810 Atherosclerosis of coronary artery bypass graft(s) without angina pectoris: Secondary | ICD-10-CM

## 2020-11-02 DIAGNOSIS — I10 Essential (primary) hypertension: Secondary | ICD-10-CM | POA: Diagnosis not present

## 2020-11-02 DIAGNOSIS — E1165 Type 2 diabetes mellitus with hyperglycemia: Secondary | ICD-10-CM | POA: Diagnosis not present

## 2020-11-02 DIAGNOSIS — I482 Chronic atrial fibrillation, unspecified: Secondary | ICD-10-CM | POA: Diagnosis not present

## 2020-11-02 DIAGNOSIS — Z794 Long term (current) use of insulin: Secondary | ICD-10-CM | POA: Diagnosis not present

## 2020-11-16 ENCOUNTER — Encounter: Payer: Self-pay | Admitting: Cardiology

## 2020-11-16 ENCOUNTER — Other Ambulatory Visit: Payer: Self-pay

## 2020-11-16 ENCOUNTER — Ambulatory Visit: Payer: Medicare Other | Admitting: Cardiology

## 2020-11-16 VITALS — BP 132/60 | HR 65 | Ht 67.0 in | Wt 136.8 lb

## 2020-11-16 DIAGNOSIS — E785 Hyperlipidemia, unspecified: Secondary | ICD-10-CM | POA: Diagnosis not present

## 2020-11-16 DIAGNOSIS — I25119 Atherosclerotic heart disease of native coronary artery with unspecified angina pectoris: Secondary | ICD-10-CM

## 2020-11-16 DIAGNOSIS — E1169 Type 2 diabetes mellitus with other specified complication: Secondary | ICD-10-CM

## 2020-11-16 DIAGNOSIS — I10 Essential (primary) hypertension: Secondary | ICD-10-CM | POA: Diagnosis not present

## 2020-11-16 DIAGNOSIS — Z951 Presence of aortocoronary bypass graft: Secondary | ICD-10-CM | POA: Diagnosis not present

## 2020-11-16 DIAGNOSIS — I739 Peripheral vascular disease, unspecified: Secondary | ICD-10-CM | POA: Diagnosis not present

## 2020-11-16 NOTE — Progress Notes (Signed)
Primary Care Provider: Rory Percy, MD -  - PCP has changed -- not sure of name (same practice), He thinks the last name is Demaris Callander. Cardiologist: Glenetta Hew, MD Electrophysiologist: None  Clinic Note: Chief Complaint  Patient presents with   Follow-up    6 months.  Stable symptoms.   Coronary Artery Disease    No real angina.    ===================================  ASSESSMENT/PLAN   Problem List Items Addressed This Visit       Cardiology Problems   CAD -> CABG x3 then Re-DO CABG x1 (LRad-dLAD) after atretic LIMA & LAD stent occlusion. - Primary (Chronic)    Multivessel CAD with CABG.  Stable by cath in September 2019.  He has these persistent musculoskeletal pains post CABG but no real anginal symptoms.  After most recent cath, we decided to forego further stress testing.  Plan: On rosuvastatin now. On amlodipine 10 mg daily with.  Additional PRN 2.5 mg for increased blood pressures. On Imdur at 90 mg Unstable very low-dose of metoprolol-not able to titrate further because of bradycardia issues. Not on ACE inhibitor or ARB.  (Calcium channel blocker preferred over RAAS inhibitors to allow for additional antianginal treatment.       Hyperlipidemia associated with type 2 diabetes mellitus (HCC) (Chronic)    Lipids are not checked for entire year.  Not since we switched him to rosuvastatin.  The reason for switching was for better lipid control but also to avoid interaction with amlodipine.  [facility].  We will check those labs.  With his advanced age, would not want to be overly aggressive, most recent LDL was 84.  For diabetes he is on [place].  Most recent A1c was not controlled.  Defer to PCP.      Essential hypertension, benign (Chronic)    Stable blood pressure today.  He has very labile pressures and we therefore have PRN 2.5 mg okay to take for high pressures.   Avoiding overtreating because of occasional hypotension.  Otherwise continue current dose of amlodipine, Toprol and Imdur.       Peripheral arterial disease - left vertebral and subclavian artery stenosis status post PTA/stent (Chronic)    Status post left vertebral stent by Dr. Gwenlyn Found back in 2005.  Is having annual Doppler check.  There is underlying right vertebral disease.  Stable.        Other   S/P CABG (coronary artery bypass graft): Initial CABGX3 1995 (LIMA-LAD, SVG-OM 2, SVG-RPDA) --> redo CABG x1 FreeLRad-LAD for a occluded LAD with atretic LIMA and failed attempted revascularization. (Chronic)    ===================================  HPI:    [name] is a 87 y.o. male with a PMH notable for CAD-CABG (1995 with redo in 2001), chronic stable angina and MSK pain since surgery, HTN, HLD and DM-2 who presents today for delayed 13-month follow-up.  Last Cath 09/2017:  1. Severe left main and 3 vessel obstructive CAD (100% LAD, 90& RCA & LCx); 2. Patent free radial graft to the LAD; 3. Patent SVG to OM1; 4. Patent SVG to distal RCA; 5. Normal LV function; 6. Normal LVEDP   [name] was last seen on April 30, 2020 -> as a hospital follow-up.  He had a pretty wide variety of blood pressure range from 92/42 mmHg with a heart rate of 48 bpm to a blood pressure of 181/61-heart rate 57.  Most  pressures were in the 150s and 160s.  Occasionally 665L and 935T systolic.  More stable since he restarted taking medications.  Apparently they have been miscommunication as to what he was mostly taken prior to his last hospitalization. -> Was having issues with his eyes causing headaches.  Having progressive vision loss.  No longer has vision in left eye after cataract surgery.  Right eye is bad but can still see. Has chronic MSK pain since his CABG also has GERD.  No exertional chest pain, stable exertional dyspnea.  No CHF symptoms.  No arrhythmia symptoms.  Converted to rosuvastatin PRN  amlodipine 2.5 mg for systolic pressure greater than 170.  Recent Hospitalizations:  07/25/2020: [facility] for hyperglycemia.  Reviewed  CV studies:    The following studies were reviewed today: (if available, images/films reviewed: From Epic Chart or Care Everywhere) Carotid Dopplers 10/16/2018: Stable.  Follow-up in 12 months. Right Carotid: Velocities in the right ICA are consistent with a 1-39% stenosis.Non-hemodynamically significant plaque <50% noted in the CCA. Unable to obtain prior elevated velocities. Left Carotid: Velocities in the left ICA are consistent with a 1-39% stenosis.  Non-hemodynamically significant plaque <50% noted in the CCA. Vertebrals: Left vertebral artery demonstrates antegrade flow. Right vertebral artery demonstrates high resistant flow. Subclavians: Normal flow hemodynamics were seen in bilateral subclavian arteries.  Interval History:   [name] returns today for annual follow-up.  He is doing pretty well but stable and stable from a cardiac standpoint.  Most of his complaints as usual are not cardiac in nature.  The as his chronic musculoskeletal chest pain that is no different from before.  Started to have more neuropathy type symptoms in his feet.  Vision is worse.  He notes his blood sugars have been going up related.  Becoming more active.  Blood sugars do not go down at night being very labile.  Difficult.  Stable exertional dyspnea as able musculoskeletal chest pain.  Occasional palpitations but nothing fast.  Excess in the time of weight loss.  He has now able to put on some weight.  He feels stronger and has more energy. Cardiovascular ROS: positive for - dyspnea on exertion and -stable musculoskeletal chest wall pain negative for - edema, irregular heartbeat, orthopnea, palpitations, paroxysmal nocturnal dyspnea, rapid heart rate, shortness of breath, or syncope or near syncope, TIA.  Or severe claudication    REVIEWED OF SYSTEMS    Pertinent symptoms noted above in HPI.  Other review of symptoms: Photophobia, vision loss.  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  Right eye has lots of light sensitivity and pain.  GERD, dyspnea, MSK chest pain.  Positional dizziness and memory loss.  I have reviewed and (if needed) personally updated the patient's problem list, medications, allergies, past medical and surgical history, social and family history.   PAST MEDICAL HISTORY   Past Medical History:  Diagnosis Date   Asymptomatic stenosis of left vertebral artery 2002, 2005   Status post PTA/stent with redo; normal antegrade flow on dopplers 09/2013   CAD (coronary artery disease) 1995   1CABG x3; redo in CABG x 1 2001 Free Radial to LAD; All grafts patent by Cath 08/2014: The proximal to mid LAD has poor retrograde filling from the LIMA due to in-stent restenosis. 50% ostial disease of free radial artery to the LAD.   CAD (coronary artery disease) of artery bypass graft 2000   PCI x 2 - ostial & prox-mid LAD (BMS) for atretic LIMA-LAD   CAD  in native artery 1995   CABG x 3 - LIMA-LAD, SVG-OM2, SVG-rPDA   Chest pain 09/2017   DM (diabetes mellitus) type II controlled peripheral vascular disorder 2002   Left subclavian and left vertebral artery stenoses   Glaucoma    Hyperlipidemia LDL goal <70    Hypertension, essential    S/P CABG x 3 1995    S/P Redo CABG x 1 2001   L Radial-LAD after 2 failed attempts @ LIMA-LAD PTCA; LAD stents 100% occluded   Stenosis of left subclavian artery ([place]) 11/2003   Status post PTA/stent --> < 50 % stenosis by Dopplers 09/2013    PAST SURGICAL HISTORY   Past Surgical History:  Procedure Laterality Date   CARDIAC CATHETERIZATION N/A 08/04/2014   Procedure: Left Heart Cath and Cors/Grafts Angiography;  Surgeon: Leonie Man, MD;  Location: [facility];  Service: Cardiovascular: o-mRCA 70%, m-dRCA ~70% - SVG-dRCA Patent.  o-pLAD 100% stent occluded, mLAD 95% ISR with poor retrograde  filling from freeRadiial graft-LAD (50% ostial graft dz).  o-p Cx 80%. Widely patent SVG-Cx-OM fills retrograde to 80% lesion.; Normal LV Fxn & EDP   CORONARY ANGIOPLASTY  April and May 2001   After Both LAD stents occluded - PTCA of anastomatic LIMA-LAD lesion -- Unsuccessful.   CORONARY ARTERY BYPASS GRAFT  1995    LIMA-LAD, SVG-OM2, SVG-rPDA   CORONARY ARTERY BYPASS GRAFT  June 2001   Dr. Lucianne Lei Trigt: Redo LAD grafting with free Left Radial-distal LAD   CORONARY STENT PLACEMENT  1995-2000   2 BMS stents to osital-proximal & proximal-mid LAD; because of atretic LIMA-LDA   LEFT HEART CATH AND CORONARY ANGIOGRAPHY N/A 09/18/2017   Procedure: LEFT HEART CATH AND CORONARY ANGIOGRAPHY;  Surgeon: Martinique, Peter M, MD;  Location: [facility];  Service: Cardiovascular;  Laterality: N/A;   LEFT HEART CATHETERIZATION WITH CORONARY/GRAFT ANGIOGRAM N/A 08/22/2011   Procedure: LEFT HEART CATHETERIZATION WITH Beatrix Fetters;  Surgeon: Leonie Man, MD;  Location: [facility];  Service: Cardiovascular:  Known occluded LIMA-LAD and ostial LAD. Moderate to severe proximal circumflex and RCA disease. Widely patent freeLRAD-dLAD, as well as SVG-RPDA (backfilling RPL), SVG-OM 2 (backfilling OM1)   SHOULDER OPEN ROTATOR CUFF REPAIR  October 2004   Dr. Noemi Chapel   [facility] [place] VERT OR THOR CAROTID STENT Left October 2002; November 2005   Left Vertebral stent placed in October 2002 (Dr. Patrecia Pour); redo PCI in 2005   [place] Left November 2005   Dr. Gwenlyn Found   TRANSTHORACIC ECHOCARDIOGRAM  04/10/2020   [facility]): Normal LV size and function.  EF 60 to 65%.  GR 1 DD.  Mild AI.  Mild to moderate LA dilation.  GR 1 DD..   Cath 09/2017   Immunization History  Administered Date(s) Administered   Influenza, High Dose Seasonal PF 09/17/2017   Influenza-Unspecified 09/03/2013    MEDICATIONS/ALLERGIES   Current Meds  Medication Sig   acetaminophen (TYLENOL) 500 MG tablet Take 1,000  mg by mouth every 6 (six) hours as needed for mild pain or headache.   acyclovir (ZOVIRAX) 400 MG tablet Take 400 mg by mouth 2 (two) times daily with a meal.    albuterol (VENTOLIN HFA) 108 (90 Base) MCG/ACT inhaler INHALE TWO PUFFS BY MOUTH FOUR TIMES DAILY   amLODipine (NORVASC) 10 MG tablet Take 1 tablet (10 mg total) by mouth daily.   amLODipine (NORVASC) 2.5 MG tablet Take 1 tablet (2.5 mg total) by mouth as needed. For blood pressure  greater than 202 systolic   aspirin EC 81 MG tablet Take 81 mg by mouth daily with breakfast.    Brinzolamide-Brimonidine 1-0.2 % SUSP Place 1 drop into both eyes 3 (three) times daily.   Cholecalciferol (VITAMIN D) 2000 units tablet Take 2,000 Units by mouth daily with breakfast.   clopidogrel (PLAVIX) 75 MG tablet Take 1 tablet (75 mg total) by mouth daily.   doxazosin (CARDURA) 8 MG tablet Take 8 mg by mouth at bedtime.    fluticasone (FLONASE) 50 MCG/ACT nasal spray Place 1 spray into both nostrils daily as needed (seasonal allergies).    gabapentin (NEURONTIN) 300 MG capsule Take 600 mg by mouth See admin instructions. Take two capsules (600 mg) by mouth three times daily - breakfast, supper and bedtime   GLOBAL INJECT EASE LANCETS 30G MISC DIABETIC CLUB - USE TO CHECK BLOOD GLUCOSE LEVELS   glucose blood test strip DIABETIC CLUB - USE TO CHECK BLOOD GLUCOSE LEVELS   hydrocortisone cream 1 % Apply 1 application topically daily as needed for itching.   isosorbide mononitrate (IMDUR) 60 MG 24 hr tablet May take 1 and 1/2 tablets to 2 tablets a day depending on if chest discomfort is present   latanoprost (XALATAN) 0.005 % ophthalmic solution Place 1 drop into the right eye at bedtime.    loratadine (CLARITIN) 10 MG tablet Take 10 mg by mouth daily as needed (seasonal allergies).    metoprolol succinate (TOPROL-XL) 25 MG 24 hr tablet Take 0.5 tablets by mouth daily.   nitroGLYCERIN (NITROSTAT) 0.4 MG SL tablet Place 1 tablet (0.4 mg total) under the tongue  every 5 (five) minutes as needed for up to 25 days for chest pain. And increase blood pressure greater than 542 systolic   Omega-3 Fatty Acids (FISH OIL) 1000 MG CAPS Take 1,000 mg by mouth 2 (two) times daily with a meal.    omeprazole (PRILOSEC) 20 MG capsule Take 20 mg by mouth daily.   ondansetron (ZOFRAN) 4 MG tablet Take by mouth.   pantoprazole (PROTONIX) 40 MG tablet Take 40 mg by mouth daily with breakfast.   prednisoLONE acetate (PRED FORTE) 1 % ophthalmic suspension Place 1 drop into the left eye 2 (two) times daily.   rosuvastatin (CRESTOR) 40 MG tablet Take 1 tablet (40 mg total) by mouth daily.   timolol (TIMOPTIC) 0.5 % ophthalmic solution Place 1 drop into both eyes daily.    TRULICITY 7.06 CB/7.6EG SOPN Inject into the skin.   vitamin C (ASCORBIC ACID) 500 MG tablet Take 500 mg by mouth 2 (two) times daily with a meal.     Allergies  Allergen Reactions   Iohexol Other (See Comments)    Intractable shaking.   Contrast Media [Iodinated Diagnostic Agents]     Shivering & shaking. Pt reports takes antihistamine prior to procedures.   Metformin And Related Diarrhea    Subsequently discontinued    SOCIAL HISTORY/FAMILY HISTORY   Reviewed in Epic:  Pertinent findings:  Social History   Tobacco Use   Smoking status: Former    Packs/day: 3.00    Years: 30.00    Pack years: 90.00    Types: Cigarettes   Smokeless tobacco: Never   Tobacco comments:    quit smoking about 50 years ago  Vaping Use   Vaping Use: Never used  Substance Use Topics   Alcohol use: No   Drug use: No   Social History   Social History Narrative   He is married with one  daughter. He has 2 grandchildren.   He does not smoke. He quit smoking in roughly 1970 after smoking 3 packs per day.   He is routinely at give him. It does not necessarily do routine exercise.     OBJCTIVE -PE, EKG, labs   Wt Readings from Last 3 Encounters:  11/16/20 136 lb 12.8 oz (62.1 kg)  06/26/20 125 lb (56.7 kg)   04/30/20 131 lb (59.4 kg)    Physical Exam: BP 132/60 (BP Location: Right Arm, Patient Position: Sitting, Cuff Size: Normal)   Pulse 65   Ht 5\' 7"  (1.702 m)   Wt 136 lb 12.8 oz (62.1 kg)   SpO2 99%   BMI 21.43 kg/m  Physical Exam Vitals reviewed.  Constitutional:      General: He is not in acute distress.    Appearance: Normal appearance. He is not ill-appearing or toxic-appearing.     Comments: Weight extenWell-groomed.sive stabilizMore robust.ed.Still somewhat frail, but  HENT:     Head: Normocephalic and atraumatic.  Neck:     Vascular: Carotid bruit (Soft right carotid/subclavian bruit) present. No hepatojugular reflux or JVD.  Cardiovascular:     Rate and Rhythm: Normal rate and regular rhythm. No extrasystoles are present.    Chest Wall: PMI is not displaced.     Pulses: Normal pulses.     Heart sounds: S1 normal and S2 normal. Heart sounds are distant. No murmur heard.   No friction rub. No gallop.  Pulmonary:     Effort: Pulmonary effort is normal. No respiratory distress.     Breath sounds: Normal breath sounds. No wheezing, rhonchi or rales.  Chest:     Chest wall: Tenderness (Manubrial/sternal tenderness) present.  Musculoskeletal:        General: No swelling. Normal range of motion.     Cervical back: Normal range of motion and neck supple.  Skin:    General: Skin is warm and dry.  Neurological:     General: No focal deficit present.     Mental Status: He is alert and oriented to person, place, and time.  Psychiatric:        Mood and Affect: Mood normal.        Behavior: Behavior normal.        Thought Content: Thought content normal.        Judgment: Judgment normal.    Adult ECG Report Not checked  Recent Labs: Apparently he is going to get labs checked at the [place] in late November  10/04/2019: TC 135, TG 105, HDL 31, LDL 84.  Hgb 13.8. 01/02/2020: Cr 1.06. 07/27/2020: A1c 13.4. Lab Results  Component Value Date   CHOL 131 09/18/2017   HDL 29 (L)  09/18/2017   LDLCALC 80 09/18/2017   TRIG 110 09/18/2017   CHOLHDL 4.5 09/18/2017   Lab Results  Component Value Date   CREATININE 1.13 09/19/2017   BUN 18 09/19/2017   NA 140 09/19/2017   K 3.9 09/19/2017   CL 107 09/19/2017   CO2 21 (L) 09/19/2017   CBC Latest Ref Rng & Units 09/19/2017 09/18/2017 09/17/2017  WBC 4.0 - 10.5 K/uL 13.9(H) 6.3 6.9  Hemoglobin 13.0 - 17.0 g/dL 12.3(L) 12.1(L) 12.9(L)  Hematocrit 39.0 - 52.0 % 36.1(L) 36.1(L) 37.4(L)  Platelets 150 - 400 K/uL 171 145(L) 178   ==================================================  COVID-19 Education: The signs and symptoms of COVID-19 were discussed with the patient and how to seek care for testing (follow up with PCP or arrange E-visit).  I spent a total of 21 minutes with the patient spent in direct patient consultation.  Additional time spent with chart review  / charting (studies, outside notes, etc): 16 min Total Time: 37 min  Current medicines are reviewed at length with the patient today.  (+/- concerns) none  This visit occurred during the SARS-CoV-2 public health emergency.  Safety protocols were in place, including screening questions prior to the visit, additional usage of staff PPE, and extensive cleaning of exam room while observing appropriate contact time as indicated for disinfecting solutions.  Notice: This dictation was prepared with Dragon dictation along with smart phrase technology. Any transcriptional errors that result from this process are unintentional and may not be corrected upon review.  Patient Instructions / Medication Changes & Studies & Tests Ordered   Patient Instructions  Medication Instructions:  NOT NEEDED *If you need a refill on your cardiac medications before your next appointment, please call your pharmacy*   Lab Work: NOT NEEDED   Testing/Procedures: NOT NEEDED   Follow-Up: At [facility], you and your health needs are our priority.  As part of our continuing  mission to provide you with exceptional heart care, we have created designated Provider Care Teams.  These Care Teams include your primary Cardiologist (physician) and Advanced Practice Providers (APPs -  Physician Assistants and Nurse Practitioners) who all work together to provide you with the care you need, when you need it.  We recommend signing up for the patient portal called "MyChart".  Sign up information is provided on this After Visit Summary.  MyChart is used to connect with patients for Virtual Visits (Telemedicine).  Patients are able to view lab/test results, encounter notes, upcoming appointments, etc.  Non-urgent messages can be sent to your provider as well.   To learn more about what you can do with MyChart, go to [email or web address].    Your next appointment:   12 month(s)  The format for your next appointment:   In Person  Provider:   Glenetta Hew, MD     Other Instructions     Studies Ordered:   No orders of the defined types were placed in this encounter.    Glenetta Hew, M.D., M.S. Interventional Cardiologist   Pager # [phone] Phone # [phone] [address] [place],  [address]   Thank you for choosing Heartcare at [facility]!!

## 2020-11-16 NOTE — Patient Instructions (Signed)
Medication Instructions:  NOT NEEDED *If you need a refill on your cardiac medications before your next appointment, please call your pharmacy*   Lab Work: NOT NEEDED   Testing/Procedures: NOT NEEDED   Follow-Up: At [facility], you and your health needs are our priority.  As part of our continuing mission to provide you with exceptional heart care, we have created designated Provider Care Teams.  These Care Teams include your primary Cardiologist (physician) and Advanced Practice Providers (APPs -  Physician Assistants and Nurse Practitioners) who all work together to provide you with the care you need, when you need it.  We recommend signing up for the patient portal called "MyChart".  Sign up information is provided on this After Visit Summary.  MyChart is used to connect with patients for Virtual Visits (Telemedicine).  Patients are able to view lab/test results, encounter notes, upcoming appointments, etc.  Non-urgent messages can be sent to your provider as well.   To learn more about what you can do with MyChart, go to [email or web address].    Your next appointment:   12 month(s)  The format for your next appointment:   In Person  Provider:   Glenetta Hew, MD     Other Instructions

## 2020-11-18 DIAGNOSIS — G319 Degenerative disease of nervous system, unspecified: Secondary | ICD-10-CM | POA: Diagnosis not present

## 2020-11-18 DIAGNOSIS — H401112 Primary open-angle glaucoma, right eye, moderate stage: Secondary | ICD-10-CM | POA: Diagnosis not present

## 2020-11-18 DIAGNOSIS — R519 Headache, unspecified: Secondary | ICD-10-CM | POA: Diagnosis not present

## 2020-11-18 DIAGNOSIS — H401123 Primary open-angle glaucoma, left eye, severe stage: Secondary | ICD-10-CM | POA: Diagnosis not present

## 2020-11-18 DIAGNOSIS — I6782 Cerebral ischemia: Secondary | ICD-10-CM | POA: Diagnosis not present

## 2020-12-02 ENCOUNTER — Other Ambulatory Visit (HOSPITAL_COMMUNITY): Payer: Self-pay | Admitting: Cardiology

## 2020-12-02 ENCOUNTER — Other Ambulatory Visit: Payer: Self-pay | Admitting: Cardiology

## 2020-12-02 DIAGNOSIS — I482 Chronic atrial fibrillation, unspecified: Secondary | ICD-10-CM | POA: Diagnosis not present

## 2020-12-02 DIAGNOSIS — I6523 Occlusion and stenosis of bilateral carotid arteries: Secondary | ICD-10-CM

## 2020-12-02 DIAGNOSIS — E1165 Type 2 diabetes mellitus with hyperglycemia: Secondary | ICD-10-CM | POA: Diagnosis not present

## 2020-12-02 DIAGNOSIS — Z794 Long term (current) use of insulin: Secondary | ICD-10-CM | POA: Diagnosis not present

## 2020-12-02 DIAGNOSIS — I10 Essential (primary) hypertension: Secondary | ICD-10-CM | POA: Diagnosis not present

## 2020-12-07 ENCOUNTER — Encounter: Payer: Self-pay | Admitting: Cardiology

## 2020-12-07 NOTE — Assessment & Plan Note (Signed)
Lipids are not checked for entire year.  Not since we switched him to rosuvastatin.  The reason for switching was for better lipid control but also to avoid interaction with amlodipine.  [facility].  We will check those labs.  With his advanced age, would not want to be overly aggressive, most recent LDL was 84.  For diabetes he is on [place].  Most recent A1c was not controlled.  Defer to PCP.

## 2020-12-07 NOTE — Assessment & Plan Note (Signed)
Stable blood pressure today.  He has very labile pressures and we therefore have PRN 2.5 mg okay to take for high pressures.  Avoiding overtreating because of occasional hypotension.  Otherwise continue current dose of amlodipine, Toprol and Imdur.

## 2020-12-07 NOTE — Assessment & Plan Note (Signed)
Status post left vertebral stent by Dr. Gwenlyn Found back in 2005.  Is having annual Doppler check.  There is underlying right vertebral disease.  Stable.

## 2020-12-07 NOTE — Assessment & Plan Note (Addendum)
Multivessel CAD with CABG.  Stable by cath in September 2019.  He has these persistent musculoskeletal pains post CABG but no real anginal symptoms.  After most recent cath, we decided to forego further stress testing.  Plan:  On rosuvastatin now.  On amlodipine 10 mg daily with.  Additional PRN 2.5 mg for increased blood pressures.  On Imdur at 90 mg  Unstable very low-dose of metoprolol-not able to titrate further because of bradycardia issues.  Not on ACE inhibitor or ARB.  (Calcium channel blocker preferred over RAAS inhibitors to allow for additional antianginal treatment.

## 2021-01-01 DIAGNOSIS — E1165 Type 2 diabetes mellitus with hyperglycemia: Secondary | ICD-10-CM | POA: Diagnosis not present

## 2021-01-01 DIAGNOSIS — I10 Essential (primary) hypertension: Secondary | ICD-10-CM | POA: Diagnosis not present

## 2021-01-01 DIAGNOSIS — I482 Chronic atrial fibrillation, unspecified: Secondary | ICD-10-CM | POA: Diagnosis not present

## 2021-01-01 DIAGNOSIS — Z794 Long term (current) use of insulin: Secondary | ICD-10-CM | POA: Diagnosis not present

## 2021-01-05 DIAGNOSIS — J069 Acute upper respiratory infection, unspecified: Secondary | ICD-10-CM | POA: Diagnosis not present

## 2021-01-19 DIAGNOSIS — I951 Orthostatic hypotension: Secondary | ICD-10-CM | POA: Diagnosis not present

## 2021-01-29 DIAGNOSIS — Z6821 Body mass index (BMI) 21.0-21.9, adult: Secondary | ICD-10-CM | POA: Diagnosis not present

## 2021-01-31 DIAGNOSIS — E1165 Type 2 diabetes mellitus with hyperglycemia: Secondary | ICD-10-CM | POA: Diagnosis not present

## 2021-01-31 DIAGNOSIS — I10 Essential (primary) hypertension: Secondary | ICD-10-CM | POA: Diagnosis not present

## 2021-02-03 ENCOUNTER — Emergency Department (HOSPITAL_COMMUNITY): Payer: Medicare Other

## 2021-02-03 ENCOUNTER — Other Ambulatory Visit: Payer: Self-pay

## 2021-02-03 ENCOUNTER — Encounter (HOSPITAL_COMMUNITY): Payer: Self-pay

## 2021-02-03 ENCOUNTER — Observation Stay (HOSPITAL_COMMUNITY)
Admission: EM | Admit: 2021-02-03 | Discharge: 2021-02-05 | Disposition: A | Discharge status: Home / Self Care | Payer: Medicare Other | Attending: Internal Medicine | Admitting: Internal Medicine

## 2021-02-03 DIAGNOSIS — R55 Syncope and collapse: Principal | ICD-10-CM | POA: Diagnosis present

## 2021-02-03 DIAGNOSIS — I25119 Atherosclerotic heart disease of native coronary artery with unspecified angina pectoris: Secondary | ICD-10-CM | POA: Diagnosis present

## 2021-02-03 DIAGNOSIS — Z87891 Personal history of nicotine dependence: Secondary | ICD-10-CM | POA: Insufficient documentation

## 2021-02-03 DIAGNOSIS — R2689 Other abnormalities of gait and mobility: Secondary | ICD-10-CM | POA: Diagnosis not present

## 2021-02-03 DIAGNOSIS — Z7982 Long term (current) use of aspirin: Secondary | ICD-10-CM | POA: Insufficient documentation

## 2021-02-03 DIAGNOSIS — I2510 Atherosclerotic heart disease of native coronary artery without angina pectoris: Secondary | ICD-10-CM | POA: Diagnosis not present

## 2021-02-03 DIAGNOSIS — E1169 Type 2 diabetes mellitus with other specified complication: Secondary | ICD-10-CM | POA: Diagnosis present

## 2021-02-03 DIAGNOSIS — R42 Dizziness and giddiness: Secondary | ICD-10-CM | POA: Diagnosis not present

## 2021-02-03 DIAGNOSIS — E1165 Type 2 diabetes mellitus with hyperglycemia: Secondary | ICD-10-CM

## 2021-02-03 DIAGNOSIS — R6889 Other general symptoms and signs: Secondary | ICD-10-CM | POA: Diagnosis not present

## 2021-02-03 DIAGNOSIS — Z951 Presence of aortocoronary bypass graft: Secondary | ICD-10-CM | POA: Diagnosis not present

## 2021-02-03 DIAGNOSIS — I110 Hypertensive heart disease with heart failure: Secondary | ICD-10-CM | POA: Insufficient documentation

## 2021-02-03 DIAGNOSIS — R791 Abnormal coagulation profile: Secondary | ICD-10-CM | POA: Diagnosis not present

## 2021-02-03 DIAGNOSIS — I25118 Atherosclerotic heart disease of native coronary artery with other forms of angina pectoris: Secondary | ICD-10-CM | POA: Diagnosis present

## 2021-02-03 DIAGNOSIS — Z79899 Other long term (current) drug therapy: Secondary | ICD-10-CM | POA: Diagnosis not present

## 2021-02-03 DIAGNOSIS — I10 Essential (primary) hypertension: Secondary | ICD-10-CM | POA: Diagnosis present

## 2021-02-03 DIAGNOSIS — W1839XA Other fall on same level, initial encounter: Secondary | ICD-10-CM | POA: Diagnosis not present

## 2021-02-03 DIAGNOSIS — Z20822 Contact with and (suspected) exposure to covid-19: Secondary | ICD-10-CM | POA: Insufficient documentation

## 2021-02-03 DIAGNOSIS — R030 Elevated blood-pressure reading, without diagnosis of hypertension: Secondary | ICD-10-CM

## 2021-02-03 DIAGNOSIS — I5032 Chronic diastolic (congestive) heart failure: Secondary | ICD-10-CM | POA: Insufficient documentation

## 2021-02-03 DIAGNOSIS — M6281 Muscle weakness (generalized): Secondary | ICD-10-CM | POA: Diagnosis not present

## 2021-02-03 DIAGNOSIS — E119 Type 2 diabetes mellitus without complications: Secondary | ICD-10-CM | POA: Diagnosis not present

## 2021-02-03 DIAGNOSIS — R7989 Other specified abnormal findings of blood chemistry: Secondary | ICD-10-CM | POA: Diagnosis present

## 2021-02-03 DIAGNOSIS — I499 Cardiac arrhythmia, unspecified: Secondary | ICD-10-CM | POA: Diagnosis not present

## 2021-02-03 DIAGNOSIS — K219 Gastro-esophageal reflux disease without esophagitis: Secondary | ICD-10-CM | POA: Diagnosis present

## 2021-02-03 DIAGNOSIS — E876 Hypokalemia: Secondary | ICD-10-CM | POA: Diagnosis not present

## 2021-02-03 DIAGNOSIS — R338 Other retention of urine: Secondary | ICD-10-CM | POA: Diagnosis present

## 2021-02-03 DIAGNOSIS — E782 Mixed hyperlipidemia: Secondary | ICD-10-CM | POA: Diagnosis present

## 2021-02-03 DIAGNOSIS — Z7902 Long term (current) use of antithrombotics/antiplatelets: Secondary | ICD-10-CM | POA: Diagnosis not present

## 2021-02-03 DIAGNOSIS — R404 Transient alteration of awareness: Secondary | ICD-10-CM | POA: Diagnosis not present

## 2021-02-03 DIAGNOSIS — R296 Repeated falls: Secondary | ICD-10-CM | POA: Diagnosis not present

## 2021-02-03 DIAGNOSIS — W19XXXA Unspecified fall, initial encounter: Secondary | ICD-10-CM

## 2021-02-03 DIAGNOSIS — E785 Hyperlipidemia, unspecified: Secondary | ICD-10-CM | POA: Diagnosis present

## 2021-02-03 DIAGNOSIS — Z743 Need for continuous supervision: Secondary | ICD-10-CM | POA: Diagnosis not present

## 2021-02-03 HISTORY — PX: TRANSTHORACIC ECHOCARDIOGRAM: SHX275

## 2021-02-03 LAB — COMPREHENSIVE METABOLIC PANEL
ALT: 28 U/L (ref 0–44)
AST: 35 U/L (ref 15–41)
Albumin: 3.5 g/dL (ref 3.5–5.0)
Alkaline Phosphatase: 42 U/L (ref 38–126)
Anion gap: 7 (ref 5–15)
BUN: 13 mg/dL (ref 8–23)
CO2: 19 mmol/L — ABNORMAL LOW (ref 22–32)
Calcium: 8.8 mg/dL — ABNORMAL LOW (ref 8.9–10.3)
Chloride: 115 mmol/L — ABNORMAL HIGH (ref 98–111)
Creatinine, Ser: 0.83 mg/dL (ref 0.61–1.24)
GFR, Estimated: >60 mL/min (ref >60)
Glucose, Bld: 121 mg/dL — ABNORMAL HIGH (ref 70–99)
Potassium: 2.8 mmol/L — ABNORMAL LOW (ref 3.5–5.1)
Sodium: 141 mmol/L (ref 135–145)
Total Bilirubin: 0.7 mg/dL (ref 0.3–1.2)
Total Protein: 6.1 g/dL — ABNORMAL LOW (ref 6.5–8.1)

## 2021-02-03 LAB — TROPONIN I (HIGH SENSITIVITY)
Troponin I (High Sensitivity): 7 ng/L (ref <18)
Troponin I (High Sensitivity): 11 ng/L (ref <18)

## 2021-02-03 LAB — CBC WITH DIFFERENTIAL/PLATELET
Abs Immature Granulocytes: 0.03 10*3/uL (ref 0.00–0.07)
Basophils Absolute: 0 10*3/uL (ref 0.0–0.1)
Basophils Relative: 0 %
Eosinophils Absolute: 0 10*3/uL (ref 0.0–0.5)
Eosinophils Relative: 0 %
HCT: 39.2 % (ref 39.0–52.0)
Hemoglobin: 13.3 g/dL (ref 13.0–17.0)
Immature Granulocytes: 0 %
Lymphocytes Relative: 16 %
Lymphs Abs: 1.3 10*3/uL (ref 0.7–4.0)
MCH: 30.9 pg (ref 26.0–34.0)
MCHC: 33.9 g/dL (ref 30.0–36.0)
MCV: 91 fL (ref 80.0–100.0)
Monocytes Absolute: 0.6 10*3/uL (ref 0.1–1.0)
Monocytes Relative: 7 %
Neutro Abs: 6 10*3/uL (ref 1.7–7.7)
Neutrophils Relative %: 77 %
Platelets: 163 10*3/uL (ref 150–400)
RBC: 4.31 MIL/uL (ref 4.22–5.81)
RDW: 12.5 % (ref 11.5–15.5)
WBC: 8 10*3/uL (ref 4.0–10.5)
nRBC: 0 % (ref 0.0–0.2)

## 2021-02-03 LAB — URINALYSIS, MICROSCOPIC (REFLEX)
Bacteria, UA: NONE SEEN
Squamous Epithelial / HPF: NONE SEEN (ref 0–5)
WBC, UA: NONE SEEN WBC/hpf (ref 0–5)

## 2021-02-03 LAB — URINALYSIS, ROUTINE W REFLEX MICROSCOPIC
Bilirubin Urine: NEGATIVE
Glucose, UA: >=500 mg/dL — AB
Ketones, ur: NEGATIVE mg/dL
Leukocytes,Ua: NEGATIVE
Nitrite: NEGATIVE
Specific Gravity, Urine: <1.005 — ABNORMAL LOW (ref 1.005–1.030)
pH: 6.5 (ref 5.0–8.0)

## 2021-02-03 LAB — RESP PANEL BY RT-PCR (FLU A&B, COVID) ARPGX2
Influenza A by PCR: NEGATIVE
Influenza B by PCR: NEGATIVE
SARS Coronavirus 2 by RT PCR: NEGATIVE

## 2021-02-03 LAB — PROTIME-INR
INR: 1.1 (ref 0.8–1.2)
Prothrombin Time: 14.6 seconds (ref 11.4–15.2)

## 2021-02-03 LAB — CBG MONITORING, ED
Glucose-Capillary: 129 mg/dL — ABNORMAL HIGH (ref 70–99)
Glucose-Capillary: 71 mg/dL (ref 70–99)

## 2021-02-03 LAB — MAGNESIUM: Magnesium: 1.6 mg/dL — ABNORMAL LOW (ref 1.7–2.4)

## 2021-02-03 MED ORDER — LACTATED RINGERS IV BOLUS
500.0000 mL | Freq: Once | INTRAVENOUS | Status: AC
  Administered 2021-02-03: 500 mL via INTRAVENOUS

## 2021-02-03 MED ORDER — DIAZEPAM 5 MG/ML IJ SOLN
5.0000 mg | Freq: Once | INTRAMUSCULAR | Status: AC
  Administered 2021-02-03: 5 mg via INTRAVENOUS
  Filled 2021-02-03: qty 2

## 2021-02-03 MED ORDER — POTASSIUM CHLORIDE CRYS ER 20 MEQ PO TBCR
40.0000 meq | EXTENDED_RELEASE_TABLET | Freq: Once | ORAL | Status: AC
  Administered 2021-02-03: 40 meq via ORAL
  Filled 2021-02-03: qty 2

## 2021-02-03 MED ORDER — POTASSIUM CHLORIDE 10 MEQ/100ML IV SOLN
10.0000 meq | INTRAVENOUS | Status: AC
  Administered 2021-02-03 (×3): 10 meq via INTRAVENOUS
  Filled 2021-02-03 (×3): qty 100

## 2021-02-03 MED ORDER — MAGNESIUM SULFATE 2 GM/50ML IV SOLN
2.0000 g | Freq: Once | INTRAVENOUS | Status: AC
  Administered 2021-02-03: 2 g via INTRAVENOUS
  Filled 2021-02-03: qty 50

## 2021-02-03 MED ORDER — DIPHENHYDRAMINE HCL 50 MG/ML IJ SOLN
25.0000 mg | Freq: Once | INTRAMUSCULAR | Status: AC
  Administered 2021-02-03: 25 mg via INTRAVENOUS
  Filled 2021-02-03: qty 1

## 2021-02-03 MED ORDER — ACETAMINOPHEN 325 MG PO TABS
650.0000 mg | ORAL_TABLET | Freq: Once | ORAL | Status: AC
  Administered 2021-02-03: 650 mg via ORAL
  Filled 2021-02-03: qty 2

## 2021-02-03 MED ORDER — METOCLOPRAMIDE HCL 5 MG/ML IJ SOLN
10.0000 mg | Freq: Once | INTRAMUSCULAR | Status: AC
  Administered 2021-02-03: 10 mg via INTRAVENOUS
  Filled 2021-02-03: qty 2

## 2021-02-03 NOTE — ED Notes (Signed)
Attempted to ambulate pt. Pt OOB with assistance. Pt became tachypneic and stated that he was feeling dizzy. Pt back in bed with pulse ox in place, cardiac monitoring, and BP cycling q 30 min.

## 2021-02-03 NOTE — ED Triage Notes (Signed)
Patient via EMS due to syncopal episode after sitting on toilet. Patient stood up became dizzy and fell onto the floor and laid there for around 15 minutes. Denies hitting head.

## 2021-02-03 NOTE — ED Provider Notes (Addendum)
4:15 PM-checkout from Dr. Doren Custard to evaluate patient for weakness which caused his fall, and has required supplementation of electrolytes.  Plan to ambulate patient and see if he can be discharged.    7:20 PM-treatment so far includes IV magnesium, IV lactated Ringer's, IV Benadryl, IV Reglan, IV and oral potassium, IV Dilantin.  He has received a combined 70 mEq of potassium.  Imaging, CT head and chest x-ray did not show acute changes  8:45 PM-family members, wife and daughter are here.  They report that the patient complained of dizziness, today, and EMS was summoned.  As of life left to go allow EMS to come in the house, they returned and found him lying on the floor by the commode.  He has been taking his usual prescribed medications they do not feel he has missed any.  He does not take any diuretics.  I report that he is a brittle diabetic, and that he takes as needed medications for blood pressure at home but they cannot recall what it is.  He is on multiple antihypertensive medications.  He is status post coronary artery bypass grafting.  Cardiac catheterization 3 years ago indicated normal systolic ejection fraction.  CBG somewhat low, patient will be given diet in the ED.  9:45-p.m. ambulation trial, patient became dizzy and short of breath with ambulation.  9:28 PM-Consult complete with hospitalist. Patient case explained and discussed.  She agrees to admit patient for further evaluation and treatment. Call ended at 10:07 PM    Daleen Bo, MD 02/03/21 [id number]

## 2021-02-03 NOTE — ED Notes (Signed)
Provided pt with meal and repositioned.

## 2021-02-03 NOTE — ED Provider Notes (Signed)
[facility] EMERGENCY DEPARTMENT Provider Note   CSN: [id number] Arrival date & time: 02/03/21  1139     History  Chief Complaint  Patient presents with   Loss of Consciousness    [name] is a 88 y.o. male.  HPI Patient presents for generalized weakness.  Medical history is notable for HTN, HLD, DM2, CAD (s/p CABG), PAD, GERD.  Patient reports that, prior to arrival, patient was on the toilet to urinate.  When he went to stand up, he fell to the floor.  He denies having a syncopal episode.  He states that when he was on the floor, he felt too weak to stand up.  He had to call EMS.  When EMS arrived, patient began having tremulousness.  He states that he has had this before and is unsure of why he gets it.  Currently, he endorses frontal headache pain as well as epigastric pain.  He does not currently have any other areas of discomfort.  At baseline, patient ambulates unassisted.  He reports that prior to his fall today, he was in his normal state of health.    Home Medications Prior to Admission medications   Medication Sig Start Date End Date Taking? Authorizing Provider  acetaminophen (TYLENOL) 500 MG tablet Take 1,000 mg by mouth every 6 (six) hours as needed for mild pain or headache.   Yes [provider]  acyclovir (ZOVIRAX) 400 MG tablet Take 400 mg by mouth 2 (two) times daily with a meal.  08/18/17  Yes [provider]  albuterol (VENTOLIN HFA) 108 (90 Base) MCG/ACT inhaler INHALE TWO PUFFS BY MOUTH FOUR TIMES DAILY 03/04/20  Yes [provider]  amLODipine (NORVASC) 2.5 MG tablet Take 1 tablet (2.5 mg total) by mouth as needed. For blood pressure greater than 294 systolic 7/65/46  Yes Leonie Man, MD  aspirin EC 81 MG tablet Take 81 mg by mouth daily with breakfast.    Yes [provider]  Brinzolamide-Brimonidine 1-0.2 % SUSP Place 1 drop into the right eye 3 (three) times daily.   Yes [provider]  Cholecalciferol  (VITAMIN D) 2000 units tablet Take 2,000 Units by mouth daily with breakfast.   Yes [provider]  ciprofloxacin (CIPRO) 500 MG tablet Take 500 mg by mouth 2 (two) times daily. 01/29/21  Yes [provider]  clopidogrel (PLAVIX) 75 MG tablet Take 75 mg by mouth daily.   Yes [provider]  finasteride (PROSCAR) 5 MG tablet Take 5 mg by mouth daily. 01/15/21  Yes [provider]  fludrocortisone (FLORINEF) 0.1 MG tablet Take 100 mcg by mouth daily. 01/19/21  Yes [provider]  gabapentin (NEURONTIN) 300 MG capsule Take 600 mg by mouth See admin instructions. Take two capsules (600 mg) by mouth three times daily - breakfast, supper and bedtime   Yes [provider]  hydrocortisone cream 1 % Apply 1 application topically daily as needed for itching.   Yes [provider]  isosorbide mononitrate (IMDUR) 60 MG 24 hr tablet May take 1 and 1/2 tablets to 2 tablets a day depending on if chest discomfort is present Patient taking differently: 30 mg daily. 11/21/16  Yes Leonie Man, MD  JARDIANCE 25 MG TABS tablet Take 25 mg by mouth daily. 01/13/21  Yes [provider]  nitroGLYCERIN (NITROSTAT) 0.4 MG SL tablet Place 1 tablet (0.4 mg total) under the tongue every 5 (five) minutes as needed for up to 25 days for  chest pain. And increase blood pressure greater than 277 systolic 08/26/21 05/06/59 Yes Leonie Man, MD  Omega-3 Fatty Acids (FISH OIL) 1000 MG CAPS Take 1,000 mg by mouth 2 (two) times daily with a meal.    Yes [provider]  omeprazole (PRILOSEC) 20 MG capsule Take 20 mg by mouth daily. 09/06/19  Yes [provider]  prednisoLONE acetate (PRED FORTE) 1 % ophthalmic suspension Place 1 drop into both eyes 2 (two) times daily.   Yes [provider]  rosuvastatin (CRESTOR) 40 MG tablet Take 1 tablet (40 mg total) by mouth daily. 05/26/20 02/04/21 Yes Leonie Man, MD  TRULICITY 4.43 XV/4.0GQ SOPN  Inject 20 mg into the skin daily. 04/27/20  Yes [provider]  vitamin C (ASCORBIC ACID) 500 MG tablet Take 500 mg by mouth 2 (two) times daily with a meal.    Yes [provider]  timolol (TIMOPTIC) 0.5 % ophthalmic solution Place 1 drop into both eyes daily.  Patient not taking: Reported on 02/04/2021    [provider]      Allergies    Iohexol, Contrast media [iodinated contrast media], and Metformin and related    Review of Systems   Review of Systems  Genitourinary:  Positive for difficulty urinating.  Neurological:  Positive for weakness (generalized) and headaches.  All other systems reviewed and are negative.  Physical Exam Updated Vital Signs BP (!) 173/79 (BP Location: Right Arm)    Pulse 89    Temp 98.2 F (36.8 C) (Oral)    Resp 17    Ht 5\' 7"  (1.702 m)    Wt 61.7 kg    SpO2 98%    BMI 21.30 kg/m  Physical Exam Constitutional:      General: He is not in acute distress.    Appearance: He is underweight. He is not toxic-appearing or diaphoretic.  HENT:     Head: Normocephalic and atraumatic.     Right Ear: External ear normal.     Left Ear: External ear normal.     Mouth/Throat:     Mouth: Mucous membranes are dry.     Pharynx: Oropharynx is clear.  Eyes:     General: No scleral icterus.    Extraocular Movements: Extraocular movements intact.  Cardiovascular:     Rate and Rhythm: Normal rate and regular rhythm.  Pulmonary:     Effort: Pulmonary effort is normal. No respiratory distress.     Breath sounds: No wheezing or rales.  Chest:     Chest wall: No tenderness.  Abdominal:     General: Abdomen is flat.     Palpations: Abdomen is soft.     Tenderness: There is no abdominal tenderness.  Musculoskeletal:        General: No tenderness, deformity or signs of injury.     Cervical back: Normal range of motion. No rigidity.     Right lower leg: No edema.     Left lower leg: No edema.     Comments: Shivering on arrival  Skin:     General: Skin is warm and dry.     Coloration: Skin is not jaundiced or pale.  Neurological:     General: No focal deficit present.     Mental Status: He is alert and oriented to person, place, and time.     Cranial Nerves: No cranial nerve deficit.     Sensory: No sensory deficit.     Motor: No weakness.  Coordination: Coordination normal.  Psychiatric:        Mood and Affect: Mood normal.        Behavior: Behavior normal.        Thought Content: Thought content normal.        Judgment: Judgment normal.    ED Results / Procedures / Treatments   Labs (all labs ordered are listed, but only abnormal results are displayed) Labs Reviewed  URINALYSIS, ROUTINE W REFLEX MICROSCOPIC - Abnormal; Notable for the following components:      Result Value   Specific Gravity, Urine <1.005 (*)    Glucose, UA >=500 (*)    Hgb urine dipstick TRACE (*)    Protein, ur TRACE (*)    All other components within normal limits  COMPREHENSIVE METABOLIC PANEL - Abnormal; Notable for the following components:   Potassium 2.8 (*)    Chloride 115 (*)    CO2 19 (*)    Glucose, Bld 121 (*)    Calcium 8.8 (*)    Total Protein 6.1 (*)    All other components within normal limits  MAGNESIUM - Abnormal; Notable for the following components:   Magnesium 1.6 (*)    All other components within normal limits  D-DIMER, QUANTITATIVE - Abnormal; Notable for the following components:   D-Dimer, Quant 1.33 (*)    All other components within normal limits  COMPREHENSIVE METABOLIC PANEL - Abnormal; Notable for the following components:   Calcium 8.3 (*)    Total Protein 5.7 (*)    Albumin 3.2 (*)    Alkaline Phosphatase 32 (*)    All other components within normal limits  CBC WITH DIFFERENTIAL/PLATELET - Abnormal; Notable for the following components:   RBC 4.09 (*)    Hemoglobin 12.6 (*)    HCT 37.1 (*)    All other components within normal limits  TSH - Abnormal; Notable for the following components:    TSH 5.719 (*)    All other components within normal limits  GLUCOSE, CAPILLARY - Abnormal; Notable for the following components:   Glucose-Capillary 127 (*)    All other components within normal limits  CBG MONITORING, ED - Abnormal; Notable for the following components:   Glucose-Capillary 129 (*)    All other components within normal limits  CBG MONITORING, ED - Abnormal; Notable for the following components:   Glucose-Capillary 68 (*)    All other components within normal limits  CBG MONITORING, ED - Abnormal; Notable for the following components:   Glucose-Capillary 118 (*)    All other components within normal limits  RESP PANEL BY RT-PCR (FLU A&B, COVID) ARPGX2  MRSA NEXT GEN BY PCR, NASAL  PROTIME-INR  CBC WITH DIFFERENTIAL/PLATELET  URINALYSIS, MICROSCOPIC (REFLEX)  MAGNESIUM  VITAMIN B12  VITAMIN D 25 HYDROXY (VIT D DEFICIENCY, FRACTURES)  HEMOGLOBIN A1C  T4, FREE  CBG MONITORING, ED  CBG MONITORING, ED  TROPONIN I (HIGH SENSITIVITY)  TROPONIN I (HIGH SENSITIVITY)    EKG EKG Interpretation  Date/Time:  Wednesday February 03 2021 12:05:43 EST Ventricular Rate:  71 PR Interval:  177 QRS Duration: 87 QT Interval:  413 QTC Calculation: 449 R Axis:   22 Text Interpretation: Sinus rhythm RSR' in V1 or V2, right VCD or RVH Nonspecific T abnrm, anterolateral leads Confirmed by Godfrey Pick ([id number]) on 02/03/2021 2:18:10 PM  Radiology CT Head Wo Contrast  Result Date: 02/03/2021 CLINICAL DATA:  Syncope EXAM: CT HEAD WITHOUT CONTRAST TECHNIQUE: Contiguous axial images were obtained from the base of  the skull through the vertex without intravenous contrast. RADIATION DOSE REDUCTION: This exam was performed according to the departmental dose-optimization program which includes automated exposure control, adjustment of the mA and/or kV according to patient size and/or use of iterative reconstruction technique. COMPARISON:  CT head 11/18/2020 FINDINGS: Brain: No acute intracranial  hemorrhage, mass effect, or herniation. No extra-axial fluid collections. No evidence of acute territorial infarct. No hydrocephalus. Mild cortical volume loss. Mild patchy hypodensities in the periventricular and subcortical white matter, likely secondary to chronic microvascular ischemic changes. Vascular: No hyperdense vessel or unexpected calcification. Skull: Normal. Negative for fracture or focal lesion. Sinuses/Orbits: No acute finding. Other: None. IMPRESSION: Chronic changes with no acute intracranial process identified. Electronically Signed   By: Ofilia Neas M.D.   On: 02/03/2021 13:42   CT Angio Chest Pulmonary Embolism (PE) W or WO Contrast  Result Date: 02/04/2021 CLINICAL DATA:  Positive D-dimer. Pulmonary embolism suspected. Syncopal episode after sitting on toilet. 4 hour premed given for contrast allergy. EXAM: CT ANGIOGRAPHY CHEST WITH CONTRAST TECHNIQUE: Multidetector CT imaging of the chest was performed using the standard protocol during bolus administration of intravenous contrast. Multiplanar CT image reconstructions and MIPs were obtained to evaluate the vascular anatomy. RADIATION DOSE REDUCTION: This exam was performed according to the departmental dose-optimization program which includes automated exposure control, adjustment of the mA and/or kV according to patient size and/or use of iterative reconstruction technique. CONTRAST:  140mL OMNIPAQUE IOHEXOL 350 MG/ML SOLN COMPARISON:  AP chest 02/03/2021, 09/13/2014; CTA chest 04/21/2011 FINDINGS: Cardiovascular: The main pulmonary artery opacification measures 440 Hounsfield units density. No filling defect is seen to indicate acute pulmonary embolism. Main pulmonary artery is normal in caliber. Heart size is normal. No pericardial effusion. No thoracic aortic aneurysm or aortic dissection. Moderate calcifications within the thoracic aorta. Left subclavian stent is again noted with additional stent again seen at the origin of the  takeoff of the left vertebral artery unchanged. Mediastinum/Nodes: No axillary mediastinal or hilar pathologically enlarged lymph nodes by CT criteria the visualized thyroid is grossly unremarkable. The esophagus follows a normal course of normal caliber. Lungs/Pleura: The central airways are patent. Trace right-greater-than-left pleural effusions with associated posterior lower lobe subsegmental atelectasis new from prior. There is an unchanged tiny bleb within the posterior inferior right lower lobe (axial series 7, image 107). There is again mild interlobular septal thickening/scarring within the bilateral lung apices. Upper Abdomen: Unremarkable. Musculoskeletal: There is a lucent lesion within the T10 vertebral body with associated trabecular thickening consistent with a benign hemangioma, not significantly changed from 04/21/2011 CT. Mild multilevel degenerative disc changes of the thoracic spine. Status post median sternotomy. Review of the MIP images confirms the above findings. IMPRESSION:: IMPRESSION: 1. No pulmonary embolism is seen. 2. Trace right-greater-than-left pleural effusions new from prior. Associated bilateral lower lung subsegmental atelectasis. Electronically Signed   By: Yvonne Kendall M.D.   On: 02/04/2021 09:02   DG Chest Port 1 View  Result Date: 02/03/2021 CLINICAL DATA:  Status post fall, syncopal episode EXAM: PORTABLE CHEST 1 VIEW COMPARISON:  07/25/2020 FINDINGS: Mild bilateral chronic interstitial thickening. No focal consolidation. No pleural effusion or pneumothorax. Heart and mediastinal contours are unremarkable. Prior CABG. No acute osseous abnormality. IMPRESSION: No active disease. Electronically Signed   By: Kathreen Devoid M.D.   On: 02/03/2021 12:36   ECHOCARDIOGRAM COMPLETE  Result Date: 02/04/2021    ECHOCARDIOGRAM REPORT   Patient Name:   [name] Date of Exam: 02/04/2021 Medical Rec #:  [id number]       Height:       67.0 in Accession #:    [id number]      Weight:        136.0 lb Date of Birth:  08/31/1932       BSA:          1.717 m Patient Age:    53 years        BP:           154/96 mmHg Patient Gender: M               HR:           82 bpm. Exam Location:  [facility] Procedure: 2D Echo, Cardiac Doppler and Color Doppler Indications:    Syncope  History:        Patient has prior history of Echocardiogram examinations, most                 recent 08/03/2014. CAD, Prior CABG; Risk Factors:Hypertension,                 Diabetes and Dyslipidemia.  Sonographer:    Wenda Low Referring Phys: [place]  1. Left ventricular ejection fraction, by estimation, is 70 to 75%. The left ventricle has hyperdynamic function. The left ventricle has no regional wall motion abnormalities. Left ventricular diastolic parameters are consistent with Grade I diastolic dysfunction (impaired relaxation).  2. Right ventricular systolic function is normal. The right ventricular size is normal. There is normal pulmonary artery systolic pressure.  3. The mitral valve is normal in structure. No evidence of mitral valve regurgitation. No evidence of mitral stenosis.  4. The aortic valve is normal in structure. Aortic valve regurgitation is trivial. No aortic stenosis is present.  5. The inferior vena cava is normal in size with greater than 50% respiratory variability, suggesting right atrial pressure of 3 mmHg. Comparison(s): Prior images reviewed side by side. FINDINGS  Left Ventricle: Left ventricular ejection fraction, by estimation, is 70 to 75%. The left ventricle has hyperdynamic function. The left ventricle has no regional wall motion abnormalities. The left ventricular internal cavity size was normal in size. There is no left ventricular hypertrophy. Left ventricular diastolic parameters are consistent with Grade I diastolic dysfunction (impaired relaxation). Right Ventricle: The right ventricular size is normal. No increase in right ventricular wall thickness. Right  ventricular systolic function is normal. There is normal pulmonary artery systolic pressure. The tricuspid regurgitant velocity is 2.31 m/s, and  with an assumed right atrial pressure of 3 mmHg, the estimated right ventricular systolic pressure is 85.4 mmHg. Left Atrium: Left atrial size was normal in size. Right Atrium: Right atrial size was normal in size. Pericardium: There is no evidence of pericardial effusion. Mitral Valve: The mitral valve is normal in structure. No evidence of mitral valve regurgitation. No evidence of mitral valve stenosis. MV peak gradient, 4.7 mmHg. The mean mitral valve gradient is 2.0 mmHg. Tricuspid Valve: The tricuspid valve is normal in structure. Tricuspid valve regurgitation is trivial. No evidence of tricuspid stenosis. Aortic Valve: The aortic valve is normal in structure. Aortic valve regurgitation is trivial. No aortic stenosis is present. Aortic valve mean gradient measures 6.0 mmHg. Aortic valve peak gradient measures 12.7 mmHg. Aortic valve area, by VTI measures 2.04 cm. Pulmonic Valve: The pulmonic valve was normal in structure. Pulmonic valve regurgitation is not visualized. No evidence of pulmonic stenosis. Aorta: The aortic root is normal in size and  structure. Venous: The inferior vena cava is normal in size with greater than 50% respiratory variability, suggesting right atrial pressure of 3 mmHg. IAS/Shunts: No atrial level shunt detected by color flow Doppler.  LEFT VENTRICLE PLAX 2D LVIDd:         4.10 cm     Diastology LVIDs:         1.70 cm     LV e' medial:    5.77 cm/s LV PW:         1.00 cm     LV E/e' medial:  11.0 LV IVS:        1.10 cm     LV e' lateral:   10.30 cm/s LVOT diam:     1.90 cm     LV E/e' lateral: 6.2 LV SV:         82 LV SV Index:   48 LVOT Area:     2.84 cm  LV Volumes (MOD) LV vol d, MOD A2C: 42.7 ml LV vol d, MOD A4C: 50.1 ml LV vol s, MOD A2C: 15.2 ml LV vol s, MOD A4C: 21.3 ml LV SV MOD A2C:     27.5 ml LV SV MOD A4C:     50.1 ml LV SV  MOD BP:      27.5 ml RIGHT VENTRICLE RV Basal diam:  2.20 cm RV Mid diam:    2.10 cm RV S prime:     11.30 cm/s TAPSE (M-mode): 1.7 cm LEFT ATRIUM             Index        RIGHT ATRIUM           Index LA diam:        4.00 cm 2.33 cm/m   RA Area:     10.50 cm LA Vol (A2C):   59.1 ml 34.43 ml/m  RA Volume:   20.20 ml  11.77 ml/m LA Vol (A4C):   48.5 ml 28.25 ml/m LA Biplane Vol: 54.0 ml 31.46 ml/m  AORTIC VALVE                     PULMONIC VALVE AV Area (Vmax):    2.13 cm      PV Vmax:       1.12 m/s AV Area (Vmean):   2.22 cm      PV Peak grad:  5.0 mmHg AV Area (VTI):     2.04 cm AV Vmax:           178.00 cm/s AV Vmean:          111.000 cm/s AV VTI:            0.404 m AV Peak Grad:      12.7 mmHg AV Mean Grad:      6.0 mmHg LVOT Vmax:         134.00 cm/s LVOT Vmean:        87.000 cm/s LVOT VTI:          0.290 m LVOT/AV VTI ratio: 0.72  AORTA Ao Root diam: 2.80 cm Ao Asc diam:  2.80 cm MITRAL VALVE                TRICUSPID VALVE MV Area (PHT): 3.54 cm     TR Peak grad:   21.3 mmHg MV Area VTI:   3.92 cm     TR Vmax:        231.00 cm/s MV Peak grad:  4.7 mmHg  MV Mean grad:  2.0 mmHg     SHUNTS MV Vmax:       1.08 m/s     Systemic VTI:  0.29 m MV Vmean:      63.4 cm/s    Systemic Diam: 1.90 cm MV Decel Time: 214 msec MV E velocity: 63.40 cm/s MV A velocity: 104.00 cm/s MV E/A ratio:  0.61 Candee Furbish MD Electronically signed by Candee Furbish MD Signature Date/Time: 02/04/2021/10:35:20 AM    Final     Procedures Procedures    Medications Ordered in ED Medications  aspirin EC tablet 81 mg (81 mg Oral Given 02/04/21 0737)  doxazosin (CARDURA) tablet 8 mg (8 mg Oral Given 02/04/21 0049)  isosorbide mononitrate (IMDUR) 24 hr tablet 30 mg (30 mg Oral Given 02/04/21 1024)  rosuvastatin (CRESTOR) tablet 40 mg (40 mg Oral Given 02/04/21 1024)  pantoprazole (PROTONIX) EC tablet 40 mg (40 mg Oral Not Given 02/04/21 1013)  clopidogrel (PLAVIX) tablet 75 mg (75 mg Oral Given 02/04/21 1024)  brinzolamide (AZOPT) 1 %  ophthalmic suspension 1 drop (1 drop Left Eye Given 02/04/21 1027)    And  brimonidine (ALPHAGAN) 0.2 % ophthalmic solution 1 drop (1 drop Left Eye Given 02/04/21 1026)  latanoprost (XALATAN) 0.005 % ophthalmic solution 1 drop (1 drop Right Eye Not Given 02/04/21 0159)  prednisoLONE acetate (PRED FORTE) 1 % ophthalmic suspension 1 drop (1 drop Left Eye Given 02/04/21 1025)  timolol (TIMOPTIC) 0.5 % ophthalmic solution 1 drop ( Both Eyes Canceled Entry 02/04/21 1030)  insulin aspart (novoLOG) injection 0-15 Units (0 Units Subcutaneous Not Given 02/04/21 1311)  insulin aspart (novoLOG) injection 0-5 Units (0 Units Subcutaneous Not Given 02/04/21 0041)  acetaminophen (TYLENOL) tablet 650 mg (has no administration in time range)    Or  acetaminophen (TYLENOL) suppository 650 mg (has no administration in time range)  oxyCODONE (Oxy IR/ROXICODONE) immediate release tablet 5 mg (has no administration in time range)  ondansetron (ZOFRAN) tablet 4 mg (has no administration in time range)    Or  ondansetron (ZOFRAN) injection 4 mg (has no administration in time range)  gabapentin (NEURONTIN) capsule 600 mg (600 mg Oral Given 02/04/21 0737)  heparin injection 5,000 Units (5,000 Units Subcutaneous Given 02/04/21 0610)  Chlorhexidine Gluconate Cloth 2 % PADS 6 each (6 each Topical Given 02/04/21 1310)  lactated ringers bolus 500 mL (500 mLs Intravenous Bolus 02/03/21 1220)  diazepam (VALIUM) injection 5 mg (5 mg Intravenous Given 02/03/21 1222)  magnesium sulfate IVPB 2 g 50 mL (0 g Intravenous Stopped 02/03/21 1831)  potassium chloride SA (KLOR-CON M) CR tablet 40 mEq (40 mEq Oral Given 02/03/21 1521)  potassium chloride 10 mEq in 100 mL IVPB (0 mEq Intravenous Stopped 02/03/21 2046)  lactated ringers bolus 500 mL (500 mLs Intravenous Bolus 02/03/21 1830)  metoCLOPramide (REGLAN) injection 10 mg (10 mg Intravenous Given 02/03/21 1531)  diphenhydrAMINE (BENADRYL) injection 25 mg (25 mg Intravenous Given 02/03/21 1532)  acetaminophen  (TYLENOL) tablet 650 mg (650 mg Oral Given 02/03/21 1521)  diphenhydrAMINE (BENADRYL) capsule 50 mg (50 mg Oral Given 02/04/21 0737)    Or  diphenhydrAMINE (BENADRYL) injection 50 mg ( Intravenous See Alternative 02/04/21 0737)  hydrocortisone sodium succinate (SOLU-CORTEF) 100 MG injection 200 mg (200 mg Intravenous Given 02/04/21 0423)  iohexol (OMNIPAQUE) 350 MG/ML injection 100 mL (100 mLs Intravenous Contrast Given 02/04/21 3094)    ED Course/ Medical Decision Making/ A&P  Medical Decision Making Amount and/or Complexity of Data Reviewed Labs: ordered. Radiology: ordered. ECG/medicine tests: ordered.  Risk OTC drugs. Prescription drug management. Decision regarding hospitalization.   This patient presents to the ED for concern of generalized weakness, this involves an extensive number of treatment options, and is a complaint that carries with it a high risk of complications and morbidity.  The differential diagnosis includes infection, metabolic derangement, dehydration, heart failure, polypharmacy, med withdrawal   Co morbidities that complicate the patient evaluation  HTN, HLD, DM2, CAD (s/p CABG), PAD, GERD   Additional history obtained:  Additional history obtained from patient's wife External records from outside source obtained and reviewed including EMR   Lab Tests:  I Ordered, and personally interpreted labs.  The pertinent results include:  hypokalemia, hypomagnesemia   Imaging Studies ordered:  I ordered imaging studies including CXR, CTH  I independently visualized and interpreted imaging which showed no acute findings I agree with the radiologist interpretation   Cardiac Monitoring:  The patient was maintained on a cardiac monitor.  I personally viewed and interpreted the cardiac monitored which showed an underlying rhythm of: sinus rhythm   Medicines ordered and prescription drug management:  I ordered medication including IVF  for dehydration, valium for shivering, mag and K+ for electrolyte replacement, reglan and benadryl for headache  Reevaluation of the patient after these medicines showed that the patient improved I have reviewed the patients home medicines and have made adjustments as needed  Critical Interventions:  mag and K+ for electrolyte replacement, foley catheter for urinary retention  Problem List / ED Course:  88 year old with history of HTN, HLD, DM2, CAD (s/p CABG), PAD, GERD, and BPH presenting for generalized weakness. He denies syncope. On arrival in the ED, he is found to have a post void residual of 600cc. A foley catheter was placed. Very clear urine was visualized. At this point, he was shivering uncontrollably, which he says occurs at times intermittently. He also endorsed a bifrontal headache. Shivering resolved with valium. Labwork showed markedly low potassium. This may be the primary cause of his recent symptoms, although it is unclear why it would be so low. Magnesium was also low and both were replaced in the ED. Patient continued to endorse a headache and headache cocktail was given. CT head and remaining lab studies were unremarkable. Patient will require reassessment folowing e- replacement. Care of patient was signed out to oncoming ED provider.   Reevaluation:  After the interventions noted above, I reevaluated the patient and found that they have :improved   Social Determinants of Health:  Multiple chronic medical conditions and advanced age   71:  After consideration of the diagnostic results and the patients response to treatment, I feel that the patent would benefit from reassessment.          Final Clinical Impression(s) / ED Diagnoses Final diagnoses:  Fall, initial encounter  Hypokalemia  Hypomagnesemia  Dizziness  Elevated blood pressure reading    Rx / DC Orders ED Discharge Orders     None         Godfrey Pick, MD 02/04/21 1719

## 2021-02-04 ENCOUNTER — Observation Stay (HOSPITAL_BASED_OUTPATIENT_CLINIC_OR_DEPARTMENT_OTHER): Payer: Medicare Other

## 2021-02-04 ENCOUNTER — Observation Stay (HOSPITAL_COMMUNITY): Payer: Medicare Other

## 2021-02-04 DIAGNOSIS — E785 Hyperlipidemia, unspecified: Secondary | ICD-10-CM

## 2021-02-04 DIAGNOSIS — E1159 Type 2 diabetes mellitus with other circulatory complications: Secondary | ICD-10-CM

## 2021-02-04 DIAGNOSIS — E1169 Type 2 diabetes mellitus with other specified complication: Secondary | ICD-10-CM | POA: Diagnosis not present

## 2021-02-04 DIAGNOSIS — Z794 Long term (current) use of insulin: Secondary | ICD-10-CM

## 2021-02-04 DIAGNOSIS — I25119 Atherosclerotic heart disease of native coronary artery with unspecified angina pectoris: Secondary | ICD-10-CM

## 2021-02-04 DIAGNOSIS — E876 Hypokalemia: Secondary | ICD-10-CM | POA: Diagnosis not present

## 2021-02-04 DIAGNOSIS — R7989 Other specified abnormal findings of blood chemistry: Secondary | ICD-10-CM | POA: Diagnosis not present

## 2021-02-04 DIAGNOSIS — R030 Elevated blood-pressure reading, without diagnosis of hypertension: Secondary | ICD-10-CM

## 2021-02-04 DIAGNOSIS — I10 Essential (primary) hypertension: Secondary | ICD-10-CM

## 2021-02-04 DIAGNOSIS — R55 Syncope and collapse: Secondary | ICD-10-CM | POA: Diagnosis not present

## 2021-02-04 DIAGNOSIS — R42 Dizziness and giddiness: Secondary | ICD-10-CM

## 2021-02-04 DIAGNOSIS — R0602 Shortness of breath: Secondary | ICD-10-CM

## 2021-02-04 LAB — COMPREHENSIVE METABOLIC PANEL
ALT: 22 U/L (ref 0–44)
AST: 24 U/L (ref 15–41)
Albumin: 3.2 g/dL — ABNORMAL LOW (ref 3.5–5.0)
Alkaline Phosphatase: 32 U/L — ABNORMAL LOW (ref 38–126)
Anion gap: 7 (ref 5–15)
BUN: 12 mg/dL (ref 8–23)
CO2: 22 mmol/L (ref 22–32)
Calcium: 8.3 mg/dL — ABNORMAL LOW (ref 8.9–10.3)
Chloride: 111 mmol/L (ref 98–111)
Creatinine, Ser: 0.9 mg/dL (ref 0.61–1.24)
GFR, Estimated: >60 mL/min (ref >60)
Glucose, Bld: 84 mg/dL (ref 70–99)
Potassium: 3.6 mmol/L (ref 3.5–5.1)
Sodium: 140 mmol/L (ref 135–145)
Total Bilirubin: 0.4 mg/dL (ref 0.3–1.2)
Total Protein: 5.7 g/dL — ABNORMAL LOW (ref 6.5–8.1)

## 2021-02-04 LAB — CBC WITH DIFFERENTIAL/PLATELET
Abs Immature Granulocytes: 0.01 10*3/uL (ref 0.00–0.07)
Basophils Absolute: 0 10*3/uL (ref 0.0–0.1)
Basophils Relative: 0 %
Eosinophils Absolute: 0.1 10*3/uL (ref 0.0–0.5)
Eosinophils Relative: 2 %
HCT: 37.1 % — ABNORMAL LOW (ref 39.0–52.0)
Hemoglobin: 12.6 g/dL — ABNORMAL LOW (ref 13.0–17.0)
Immature Granulocytes: 0 %
Lymphocytes Relative: 28 %
Lymphs Abs: 1.9 10*3/uL (ref 0.7–4.0)
MCH: 30.8 pg (ref 26.0–34.0)
MCHC: 34 g/dL (ref 30.0–36.0)
MCV: 90.7 fL (ref 80.0–100.0)
Monocytes Absolute: 0.6 10*3/uL (ref 0.1–1.0)
Monocytes Relative: 9 %
Neutro Abs: 4.2 10*3/uL (ref 1.7–7.7)
Neutrophils Relative %: 61 %
Platelets: 153 10*3/uL (ref 150–400)
RBC: 4.09 MIL/uL — ABNORMAL LOW (ref 4.22–5.81)
RDW: 12.8 % (ref 11.5–15.5)
WBC: 6.8 10*3/uL (ref 4.0–10.5)
nRBC: 0 % (ref 0.0–0.2)

## 2021-02-04 LAB — GLUCOSE, CAPILLARY
Glucose-Capillary: 127 mg/dL — ABNORMAL HIGH (ref 70–99)
Glucose-Capillary: 170 mg/dL — ABNORMAL HIGH (ref 70–99)

## 2021-02-04 LAB — ECHOCARDIOGRAM COMPLETE
AR max vel: 2.13 cm2
AV Area VTI: 2.04 cm2
AV Area mean vel: 2.22 cm2
AV Mean grad: 6 mmHg
AV Peak grad: 12.7 mmHg
Ao pk vel: 1.78 m/s
Area-P 1/2: 3.54 cm2
Calc EF: 59.4 %
Height: 67 in
MV VTI: 3.92 cm2
S' Lateral: 1.7 cm
Single Plane A2C EF: 64.4 %
Single Plane A4C EF: 57.5 %
Weight: 2176 oz

## 2021-02-04 LAB — MAGNESIUM: Magnesium: 2 mg/dL (ref 1.7–2.4)

## 2021-02-04 LAB — D-DIMER, QUANTITATIVE: D-Dimer, Quant: 1.33 ug/mL-FEU — ABNORMAL HIGH (ref 0.00–0.50)

## 2021-02-04 LAB — CBG MONITORING, ED
Glucose-Capillary: 118 mg/dL — ABNORMAL HIGH (ref 70–99)
Glucose-Capillary: 68 mg/dL — ABNORMAL LOW (ref 70–99)
Glucose-Capillary: 86 mg/dL (ref 70–99)

## 2021-02-04 LAB — MRSA NEXT GEN BY PCR, NASAL: MRSA by PCR Next Gen: NOT DETECTED

## 2021-02-04 LAB — T4, FREE: Free T4: 0.79 ng/dL (ref 0.61–1.12)

## 2021-02-04 LAB — TSH: TSH: 5.719 u[IU]/mL — ABNORMAL HIGH (ref 0.350–4.500)

## 2021-02-04 LAB — VITAMIN D 25 HYDROXY (VIT D DEFICIENCY, FRACTURES): Vit D, 25-Hydroxy: 46.37 ng/mL (ref 30–100)

## 2021-02-04 LAB — VITAMIN B12: Vitamin B-12: 208 pg/mL (ref 180–914)

## 2021-02-04 MED ORDER — INSULIN ASPART 100 UNIT/ML IJ SOLN
0.0000 [IU] | Freq: Three times a day (TID) | INTRAMUSCULAR | Status: DC
  Administered 2021-02-04: 2 [IU] via SUBCUTANEOUS
  Administered 2021-02-05: 3 [IU] via SUBCUTANEOUS

## 2021-02-04 MED ORDER — ACETAMINOPHEN 325 MG PO TABS: 650.0000 mg | ORAL_TABLET | Freq: Four times a day (QID) | ORAL | Status: DC | PRN

## 2021-02-04 MED ORDER — ONDANSETRON HCL 4 MG/2ML IJ SOLN: 4.0000 mg | Freq: Four times a day (QID) | INTRAMUSCULAR | Status: DC | PRN

## 2021-02-04 MED ORDER — ONDANSETRON HCL 4 MG PO TABS: 4.0000 mg | ORAL_TABLET | Freq: Four times a day (QID) | ORAL | Status: DC | PRN

## 2021-02-04 MED ORDER — LATANOPROST 0.005 % OP SOLN
1.0000 [drp] | Freq: Every day | OPHTHALMIC | Status: DC
  Administered 2021-02-04: 1 [drp] via OPHTHALMIC
  Filled 2021-02-04 (×2): qty 2.5

## 2021-02-04 MED ORDER — INSULIN ASPART 100 UNIT/ML IJ SOLN: 0.0000 [IU] | Freq: Every day | INTRAMUSCULAR | Status: DC

## 2021-02-04 MED ORDER — ACETAMINOPHEN 650 MG RE SUPP: 650.0000 mg | Freq: Four times a day (QID) | RECTAL | Status: DC | PRN

## 2021-02-04 MED ORDER — IOHEXOL 350 MG/ML SOLN
100.0000 mL | Freq: Once | INTRAVENOUS | Status: AC | PRN
  Administered 2021-02-04: 100 mL via INTRAVENOUS

## 2021-02-04 MED ORDER — CLOPIDOGREL BISULFATE 75 MG PO TABS
75.0000 mg | ORAL_TABLET | Freq: Every day | ORAL | Status: DC
Start: 2021-02-04 — End: 2021-02-05
  Administered 2021-02-04 – 2021-02-05 (×2): 75 mg via ORAL
  Filled 2021-02-04 (×2): qty 1

## 2021-02-04 MED ORDER — PREDNISOLONE ACETATE 1 % OP SUSP
1.0000 [drp] | Freq: Two times a day (BID) | OPHTHALMIC | Status: DC
  Administered 2021-02-04 – 2021-02-05 (×2): 1 [drp] via OPHTHALMIC
  Filled 2021-02-04: qty 1

## 2021-02-04 MED ORDER — GABAPENTIN 300 MG PO CAPS: 600.0000 mg | ORAL_CAPSULE | ORAL | Status: DC

## 2021-02-04 MED ORDER — HEPARIN SODIUM (PORCINE) 5000 UNIT/ML IJ SOLN
5000.0000 [IU] | Freq: Three times a day (TID) | INTRAMUSCULAR | Status: DC
  Administered 2021-02-04 – 2021-02-05 (×4): 5000 [IU] via SUBCUTANEOUS
  Filled 2021-02-04 (×4): qty 1

## 2021-02-04 MED ORDER — DIPHENHYDRAMINE HCL 25 MG PO CAPS
50.0000 mg | ORAL_CAPSULE | Freq: Once | ORAL | Status: AC
  Administered 2021-02-04: 50 mg via ORAL
  Filled 2021-02-04: qty 2

## 2021-02-04 MED ORDER — ROSUVASTATIN CALCIUM 20 MG PO TABS
40.0000 mg | ORAL_TABLET | Freq: Every day | ORAL | Status: DC
Start: 2021-02-04 — End: 2021-02-05
  Administered 2021-02-04 – 2021-02-05 (×2): 40 mg via ORAL
  Filled 2021-02-04 (×2): qty 2

## 2021-02-04 MED ORDER — HYDROCORTISONE SOD SUC (PF) 100 MG IJ SOLR
200.0000 mg | Freq: Once | INTRAMUSCULAR | Status: AC
  Administered 2021-02-04: 200 mg via INTRAVENOUS
  Filled 2021-02-04: qty 4

## 2021-02-04 MED ORDER — HEPARIN SODIUM (PORCINE) 5000 UNIT/ML IJ SOLN: 5000.0000 [IU] | Freq: Three times a day (TID) | INTRAMUSCULAR | Status: DC

## 2021-02-04 MED ORDER — DIPHENHYDRAMINE HCL 50 MG/ML IJ SOLN
50.0000 mg | Freq: Once | INTRAMUSCULAR | Status: AC
  Filled 2021-02-04: qty 1

## 2021-02-04 MED ORDER — FINASTERIDE 5 MG PO TABS
5.0000 mg | ORAL_TABLET | Freq: Every day | ORAL | Status: DC
  Administered 2021-02-04 – 2021-02-05 (×2): 5 mg via ORAL
  Filled 2021-02-04: qty 1

## 2021-02-04 MED ORDER — HYDROCORTISONE SOD SUC (PF) 250 MG IJ SOLR
200.0000 mg | Freq: Once | INTRAMUSCULAR | Status: DC
  Filled 2021-02-04: qty 200

## 2021-02-04 MED ORDER — PANTOPRAZOLE SODIUM 40 MG PO TBEC
40.0000 mg | DELAYED_RELEASE_TABLET | Freq: Every day | ORAL | Status: DC
  Administered 2021-02-05: 40 mg via ORAL
  Filled 2021-02-04: qty 1

## 2021-02-04 MED ORDER — DOXAZOSIN MESYLATE 2 MG PO TABS
8.0000 mg | ORAL_TABLET | Freq: Every day | ORAL | Status: DC
  Administered 2021-02-04 (×2): 8 mg via ORAL
  Filled 2021-02-04: qty 4
  Filled 2021-02-04: qty 2

## 2021-02-04 MED ORDER — BRIMONIDINE TARTRATE 0.2 % OP SOLN
1.0000 [drp] | Freq: Three times a day (TID) | OPHTHALMIC | Status: DC
  Administered 2021-02-04 – 2021-02-05 (×5): 1 [drp] via OPHTHALMIC
  Filled 2021-02-04 (×2): qty 5

## 2021-02-04 MED ORDER — ISOSORBIDE MONONITRATE ER 60 MG PO TB24
30.0000 mg | ORAL_TABLET | Freq: Every day | ORAL | Status: DC
  Administered 2021-02-04 – 2021-02-05 (×2): 30 mg via ORAL
  Filled 2021-02-04 (×2): qty 1

## 2021-02-04 MED ORDER — CHLORHEXIDINE GLUCONATE CLOTH 2 % EX PADS
6.0000 | MEDICATED_PAD | Freq: Every day | CUTANEOUS | Status: DC
  Administered 2021-02-04 – 2021-02-05 (×2): 6 via TOPICAL

## 2021-02-04 MED ORDER — BRINZOLAMIDE 1 % OP SUSP
1.0000 [drp] | Freq: Three times a day (TID) | OPHTHALMIC | Status: DC
  Administered 2021-02-04 – 2021-02-05 (×4): 1 [drp] via OPHTHALMIC
  Filled 2021-02-04: qty 10

## 2021-02-04 MED ORDER — GABAPENTIN 300 MG PO CAPS
600.0000 mg | ORAL_CAPSULE | Freq: Three times a day (TID) | ORAL | Status: DC
  Administered 2021-02-04 – 2021-02-05 (×4): 600 mg via ORAL
  Filled 2021-02-04 (×4): qty 2

## 2021-02-04 MED ORDER — TIMOLOL MALEATE 0.5 % OP SOLN
1.0000 [drp] | Freq: Every day | OPHTHALMIC | Status: DC
  Administered 2021-02-05: 1 [drp] via OPHTHALMIC
  Filled 2021-02-04 (×2): qty 5

## 2021-02-04 MED ORDER — METOPROLOL SUCCINATE ER 25 MG PO TB24: 12.5000 mg | ORAL_TABLET | Freq: Every day | ORAL | Status: DC

## 2021-02-04 MED ORDER — INSULIN DETEMIR 100 UNIT/ML ~~LOC~~ SOLN
10.0000 [IU] | Freq: Every day | SUBCUTANEOUS | Status: DC
  Administered 2021-02-04: 10 [IU] via SUBCUTANEOUS
  Filled 2021-02-04 (×2): qty 0.1

## 2021-02-04 MED ORDER — ASPIRIN EC 81 MG PO TBEC
81.0000 mg | DELAYED_RELEASE_TABLET | Freq: Every day | ORAL | Status: DC
  Administered 2021-02-04 – 2021-02-05 (×2): 81 mg via ORAL
  Filled 2021-02-04 (×2): qty 1

## 2021-02-04 MED ORDER — OXYCODONE HCL 5 MG PO TABS: 5.0000 mg | ORAL_TABLET | ORAL | Status: DC | PRN

## 2021-02-04 NOTE — Assessment & Plan Note (Signed)
continue PPI

## 2021-02-04 NOTE — Evaluation (Signed)
Physical Therapy Evaluation Patient Details Name: [name] MRN: [id number] DOB: 06-05-1932 Today's Date: 02/04/2021  History of Present Illness  [name] is a 88 y.o. 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  Patient reports if he is at home, his blood pressure became very high, and he had a headache.  His headache was located across the front of his forehead which is his normal location for headache.  He reports he went to the bathroom to have a bowel movement and when he stood up he collapsed to the ground.  Patient reports he remembers the whole thing.  He reports that he did not hit his head and he did not lose consciousness.  Family had reported that he did lose consciousness.  He reports being on the ground for several minutes.  He thinks it is glucose was also elevated at that time but he does not remember how high.  Reports high numbers for him would be over 350.  He did have dizziness that felt like lightheadedness.  He did not notice the room spinning.  Patient reports that recent health concerns have included UTI for which she was taking antibiotics outpatient, and left eye cataract surgery for which she is using drops at home.  Patient reports he has had a normal appetite, and he does not admit to having diarrhea.  He reports he drinks plenty of water and stays hydrated at home.  He does report he has had generalized weakness that has been gradually worsening.  He denies myalgias, no fevers.  He reports he has chronic chest pain ever since having his CABG, with no change in that chest pain.  He feels as though his abdomen may be bloated at the site where he gives himself insulin shots.  He does not describe the pain  there but does say it feels like a pressure.  Patient is being admitted because they attempted to ambulate him after having replaced electrolytes, and given a small fluid bolus.  Patient became tachycardic, and tachypneic when trying to walk just a few steps.  Baseline for him is being able to walk around the home without stopping for breaks.  ED is concerning the patient needed a PE work-up, but patient has a contrast allergy.  He needs to be premedicated before he can get a CTA which will be done at admission.   Clinical Impression  Patient functioning near baseline for functional mobility and gait other than decreased balance for transfers and gait and requires the use of RW for safety.  Patient demonstrates good return for transferring to chair/BSC with slightly labored movement and ambulated in room/hallway without loss of balance, but limited mostly due to fatigue.  Patient tolerated sitting up on [facility] to attempt bowel movement with spouse supervising - nursing staff notified.  Patient will benefit from continued skilled physical therapy in hospital and recommended venue below to increase strength, balance, endurance for safe ADLs and gait.         Recommendations for follow up therapy are one component of a multi-disciplinary discharge planning process, led by the attending physician.  Recommendations may be updated based on patient status, additional functional criteria and insurance authorization.  Follow Up Recommendations Home health PT    Assistance Recommended at  Discharge Intermittent Supervision/Assistance  Patient can return home with the following  A little help with walking and/or transfers;A little help with bathing/dressing/bathroom;Help with stairs or ramp for entrance;Assistance with cooking/housework    Equipment Recommendations None recommended by PT  Recommendations for Other Services       Functional Status Assessment Patient has had a recent decline in their functional  status and demonstrates the ability to make significant improvements in function in a reasonable and predictable amount of time.     Precautions / Restrictions Precautions Precautions: Fall Restrictions Weight Bearing Restrictions: No      Mobility  Bed Mobility Overal bed mobility: Modified Independent             General bed mobility comments: increased time    Transfers Overall transfer level: Needs assistance Equipment used: Rolling walker (2 wheels), None, 1 person hand held assist Transfers: Sit to/from Stand, Bed to chair/wheelchair/BSC Sit to Stand: Min guard   Step pivot transfers: Min guard       General transfer comment: has to lean on armrest of chair for support when not using RW    Ambulation/Gait Ambulation/Gait assistance: Min guard, Min assist Gait Distance (Feet): 40 Feet Assistive device: Rolling walker (2 wheels) Gait Pattern/deviations: Decreased step length - right, Decreased step length - left, Decreased stride length Gait velocity: decreased     General Gait Details: slightly labored slow cadence without loss of balance, limited mostly due to fatigue  Stairs            Wheelchair Mobility    Modified Rankin (Stroke Patients Only)       Balance Overall balance assessment: Needs assistance Sitting-balance support: Feet supported, No upper extremity supported Sitting balance-Leahy Scale: Good Sitting balance - Comments: seated at EOB   Standing balance support: During functional activity, No upper extremity supported Standing balance-Leahy Scale: Poor Standing balance comment: fair/poor without AD, fair/good using RW                             Pertinent Vitals/Pain Pain Assessment Pain Assessment: 0-10 Pain Score: 5  Pain Location: stomach Pain Descriptors / Indicators: Sore Pain Intervention(s): Limited activity within patient's tolerance, Monitored during session, Repositioned    Home Living  Family/patient expects to be discharged to:: Private residence Living Arrangements: Spouse/significant other Available Help at Discharge: Family;Available 24 hours/day Type of Home: Mobile home Home Access: Ramped entrance;Stairs to enter Entrance Stairs-Rails: None Entrance Stairs-Number of Steps: 1   Home Layout: One level Home Equipment: Conservation officer, nature (2 wheels);Cane - single point;Grab bars - tub/shower;Shower seat      Prior Function Prior Level of Function : Independent/Modified Independent             Mobility Comments: Hydrographic surveyor without AD, does not drive due to recent eye surgery ADLs Comments: assisted by family     Hand Dominance   Dominant Hand: Right    Extremity/Trunk Assessment   Upper Extremity Assessment Upper Extremity Assessment: Generalized weakness    Lower Extremity Assessment Lower Extremity Assessment: Generalized weakness    Cervical / Trunk Assessment Cervical / Trunk Assessment: Normal  Communication   Communication: No difficulties  Cognition Arousal/Alertness: Awake/alert Behavior During Therapy: WFL for tasks assessed/performed Overall Cognitive Status: Within Functional Limits for tasks assessed  General Comments      Exercises     Assessment/Plan    PT Assessment Patient needs continued PT services  PT Problem List Decreased strength;Decreased activity tolerance;Decreased balance;Decreased mobility       PT Treatment Interventions DME instruction;Gait training;Stair training;Functional mobility training;Therapeutic activities;Therapeutic exercise;Patient/family education;Balance training    PT Goals (Current goals can be found in the Care Plan section)  Acute Rehab PT Goals Patient Stated Goal: return home with family to assist PT Goal Formulation: With patient/family Time For Goal Achievement: 02/08/21 Potential to Achieve Goals: Good    Frequency  Min 3X/week     Co-evaluation               AM-PAC PT "6 Clicks" Mobility  Outcome Measure Help needed turning from your back to your side while in a flat bed without using bedrails?: None Help needed moving from lying on your back to sitting on the side of a flat bed without using bedrails?: None Help needed moving to and from a bed to a chair (including a wheelchair)?: A Little Help needed standing up from a chair using your arms (e.g., wheelchair or bedside chair)?: A Little Help needed to walk in hospital room?: A Little Help needed climbing 3-5 steps with a railing? : A Lot 6 Click Score: 19    End of Session   Activity Tolerance: Patient tolerated treatment well;Patient limited by fatigue Patient left: in chair;with call bell/phone within reach;with family/visitor present Nurse Communication: Mobility status PT Visit Diagnosis: Unsteadiness on feet (R26.81);Other abnormalities of gait and mobility (R26.89);Muscle weakness (generalized) (M62.81)    Time: 6378-5885 PT Time Calculation (min) (ACUTE ONLY): 26 min   Charges:   PT Evaluation $PT Eval Moderate Complexity: 1 Mod PT Treatments $Therapeutic Activity: 23-37 mins        2:12 PM, 02/04/21 Lonell Grandchild, MPT Physical Therapist with [facility] 336 [phone] office [phone] mobile phone

## 2021-02-04 NOTE — Plan of Care (Signed)
°  Problem: Acute Rehab PT Goals(only PT should resolve) Goal: Pt Will Go Supine/Side To Sit Outcome: Progressing Flowsheets (Taken 02/04/2021 1413) Pt will go Supine/Side to Sit:  Independently  with modified independence Goal: Patient Will Transfer Sit To/From Stand Outcome: Progressing Flowsheets (Taken 02/04/2021 1413) Patient will transfer sit to/from stand:  with supervision  with modified independence Goal: Pt Will Transfer Bed To Chair/Chair To Bed Outcome: Progressing Flowsheets (Taken 02/04/2021 1413) Pt will Transfer Bed to Chair/Chair to Bed:  with supervision  with modified independence Goal: Pt Will Ambulate Outcome: Progressing Flowsheets (Taken 02/04/2021 1413) Pt will Ambulate:  100 feet  with supervision  with rolling walker   2:13 PM, 02/04/21 Lonell Grandchild, MPT Physical Therapist with [facility] [phone] office [phone] mobile phone

## 2021-02-04 NOTE — ED Notes (Signed)
600 emptied from foley bag

## 2021-02-04 NOTE — Assessment & Plan Note (Addendum)
-  Magnesium 1.6 at time of admission -adequately repleted and WNL currently -Repeat magnesium level at follow-up visit to assess electrolytes stability.

## 2021-02-04 NOTE — Assessment & Plan Note (Addendum)
-  Patient tachypnea and tachycardia with ambulation resolved and not present at discharge. -No further episodes of near syncope or lightheadedness -2D echo with normal motion abnormalities, preserved ejection fraction and no significant valvular disorder. -Patient's symptoms most likely associated with vagal syncope due to difficulty streaming urine with the presence of BPH. -TSH marginally elevated, with normal free T4.  Repeat thyroid panel in 8 weeks. -D-Dimer was elevated and CT angiogram negative for pulmonary embolism.  Patient denies chest pain, is not hypoxic and demonstrating good respiratory effort.

## 2021-02-04 NOTE — Assessment & Plan Note (Addendum)
-  will continue statin -Heart healthy diet discussed with patient.

## 2021-02-04 NOTE — Progress Notes (Signed)
*  PRELIMINARY RESULTS* Echocardiogram 2D Echocardiogram has been performed.  [name] 02/04/2021, 10:20 AM

## 2021-02-04 NOTE — Assessment & Plan Note (Addendum)
-  Potassium 2.8 on admission -repleted and WNL currently -Repeat with metabolic panel to follow electrolytes stability of follow-up visit.

## 2021-02-04 NOTE — Progress Notes (Signed)
Inpatient Diabetes Program Recommendations  AACE/ADA: New Consensus Statement on Inpatient Glycemic Control  Target Ranges:  Prepandial:   less than 140 mg/dL      Peak postprandial:   less than 180 mg/dL (1-2 hours)      Critically ill patients:  140 - 180 mg/dL    Latest Reference Range & Units 02/04/21 00:40 02/04/21 06:05 02/04/21 06:40  Glucose-Capillary 70 - 99 mg/dL 86 68 (L) 118 (H)    Latest Reference Range & Units 02/03/21 11:44 02/03/21 19:29  Glucose-Capillary 70 - 99 mg/dL 129 (H) 71   Review of Glycemic Control  Diabetes history: DM2 Outpatient Diabetes medications: Trulicity 5.18 mg Qweek Current orders for Inpatient glycemic control: Levemir 10 units QHS, Novolog 0-15 units TID with meals, Novolog 0-5 units QHS  Inpatient Diabetes Program Recommendations:    Insulin: Patient received Levemir 10 units at 2:20 am today and fasting glucose 68 mg/dl today. Please discontinue Levemir.  Thanks, Barnie Alderman, RN, MSN, CDE Diabetes Coordinator Inpatient Diabetes Program [phone] (Team Pager from 8am to 5pm)

## 2021-02-04 NOTE — Progress Notes (Signed)
Progress Note   Patient: [name] ZHG:[id number] DOB: Nov 08, 1932 DOA: 02/03/2021     0 DOS: the patient was seen and examined on 02/04/2021   Brief admission narrative: As per H&P written by Dr. Clearence Ped on 02/04/21 [name] is a 88 y.o. 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  Patient reports if he is at home, his blood pressure became very high, and he had a headache.  His headache was located across the front of his forehead which is his normal location for headache.  He reports he went to the bathroom to have a bowel movement and when he stood up he collapsed to the ground.  Patient reports he remembers the whole thing.  He reports that he did not hit his head and he did not lose consciousness.  Family had reported that he did lose consciousness.  He reports being on the ground for several minutes.  He thinks it is glucose was also elevated at that time but he does not remember how high.  Reports high numbers for him would be over 350.  He did have dizziness that felt like lightheadedness.  He did not notice the room spinning.  Patient reports that recent health concerns have included UTI for which she was taking antibiotics outpatient, and left eye cataract surgery for which she is using drops at home.  Patient reports he has had a normal appetite, and he does not admit to having diarrhea.  He reports he drinks plenty of water and stays hydrated at home.  He does report he has had generalized weakness that has been gradually worsening.  He denies myalgias, no fevers.  He reports he has chronic chest pain ever since having his CABG, with no change in that chest pain.  He feels as though his abdomen may be bloated at the site  where he gives himself insulin shots.  He does not describe the pain there but does say it feels like a pressure.  Patient is being admitted because they attempted to ambulate him after having replaced electrolytes, and given a small fluid bolus.  Patient became tachycardic, and tachypneic when trying to walk just a few steps.  Baseline for him is being able to walk around the home without stopping for breaks.  ED is concerning the patient needed a PE work-up, but patient has a contrast allergy.  He needs to be premedicated before he can get a CTA which will be done at admission.  Assessment and Plan: * Syncope- (present on admission) -Patient with tachypnea and tachycardia with ambulation -no further episodes of near syncope or lightheadedness -will check 2-D echo, Vit B12 and Vit D -complete work up and follow pending results. -TSH marginally elevated, will check free T4 -D-Dimer was elevated and will complete CTA examination. -continue telemetry monitoring   Elevated d-dimer- (present on admission) -Currently premedicating for CTA chest -patient denies CP and is currently non hypoxic or complaining of SOB at rest.  Hypomagnesemia- (present on admission) -Magnesium 1.6 at time of admission -adequately repleted and WNL currently -continue to follow trend    Hypokalemia- (present on admission) -Potassium 2.8 on admission -repleted and WNL currently -continue to monitor trend   GERD (gastroesophageal reflux disease)- (present on admission) -continue PPI  DMII (  diabetes mellitus, type 2) ([place]) -Patient with mild hypoglycemic event earlier this morning (CBG 68) -continue to follow CBG fluctuation -continue SSI and follow A1C results. -continue modified carb diet  CAD -> CABG x3 then Re-DO CABG x1 (LRad-dLAD) after atretic LIMA & LAD stent occlusion.- (present on admission) -denies CP -continue metoprolol, Imdur, aspirin and Plavix  Essential hypertension, benign- (present on  admission) -Continue Cardura and Continue metoprolol -follow heart healthy diet -heart healthy diet discussed with patient  Hyperlipidemia associated with type 2 diabetes mellitus ([place])- (present on admission) -will continue statin   Subjective: no CP, no SOB at rest, afebrile. Hemodynamically stable and in not distress.  Physical Exam: Vitals:   02/04/21 0400 02/04/21 0500 02/04/21 0530 02/04/21 0600  BP: (!) 154/66 (!) 161/62 (!) 155/51 (!) 154/96  Pulse: 66 68 69 75  Resp: 16 12 15  (!) 21  Temp:      TempSrc:      SpO2: 97% 97% 98% 97%  Weight:      Height:       General exam: Alert, awake, oriented x 3; in not acute distress. Respiratory system: Clear to auscultation. Respiratory effort normal. No using accessory muscles. Cardiovascular system:RRR. No rubs or gallops; no JVd on exam. Gastrointestinal system: Abdomen is nondistended, soft and nontender. No organomegaly or masses felt. Normal bowel sounds heard. Central nervous system: Alert and oriented. No focal neurological deficits. Extremities: No cyanosis, no clubbing. Skin: No rashes, no petechiae. Psychiatry: Judgement and insight appear normal. Mood & affect appropriate.    Data Reviewed: TSH 5.719 Mild hypoglycemic event appreciated; will adjust long acting dose as mentioned above. Normal magnesium level Potassium repleted and WNL currently.  2-D echo: ordered and results pending currently.  Family Communication: no family at bedside.  Disposition: Status is: Observation  The patient remains OBS appropriate and will d/c before 2 midnights.    Planned Discharge Destination: Home  Author: Barton Dubois, MD 02/04/2021 8:29 AM  For on call review [email or web address].

## 2021-02-04 NOTE — Assessment & Plan Note (Addendum)
-  Patient denies chest pain, shortness of breath and found not to be hypoxic. -CT angiogram negative for pulmonary embolism. -patient tolerated preparation for the use of contrast without any problems.

## 2021-02-04 NOTE — Assessment & Plan Note (Addendum)
-  Patient with mild hypoglycemic event during hospitalization receiving long-acting insulin without oral intake. -After adjusting insulin regimen no further events appreciated. -Discussed with patient he reports experiencing intermittently hypoglycemic events in the morning with his current regimen. -We will recommend adjusting the dose of Trulicity and patient has been encouraged to take a snack before going to bed. -A1c demonstrating poorly controlled diabetes (9.3); but given age and frailty high risk for ongoing symptomatic hypoglycemia with diet control. -Will recommend close follow-up with PCP to further adjust regimen.

## 2021-02-04 NOTE — ED Notes (Signed)
Patient transported to CT 

## 2021-02-04 NOTE — H&P (Signed)
History and Physical    Patient: [name] BPZ:[id number] DOB: [birth year] DOA: 02/03/2021 DOS: the patient was seen and examined on 02/04/2021 PCP: Rory Percy, MD  Patient coming from: Home  Chief Complaint:  Chief Complaint  Patient presents with   Loss of Consciousness    HPI: [name] is a 88 y.o. 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  Patient reports if he is at home, his blood pressure became very high, and he had a headache.  His headache was located across the front of his forehead which is his normal location for headache.  He reports he went to the bathroom to have a bowel movement and when he stood up he collapsed to the ground.  Patient reports he remembers the whole thing.  He reports that he did not hit his head and he did not lose consciousness.  Family had reported that he did lose consciousness.  He reports being on the ground for several minutes.  He thinks it is glucose was also elevated at that time but he does not remember how high.  Reports high numbers for him would be over 350.  He did have dizziness that felt like lightheadedness.  He did not notice the room spinning.  Patient reports that recent health concerns have included UTI for which she was taking antibiotics outpatient, and left eye cataract surgery for which she is using drops at home.  Patient reports he has had a normal appetite, and he does not admit to having diarrhea.  He reports he drinks plenty of water and stays hydrated at home.  He does report he has had generalized weakness that has been gradually worsening.  He denies myalgias, no fevers.  He reports he has chronic chest pain ever since having his CABG, with no change in that  chest pain.  He feels as though his abdomen may be bloated at the site where he gives himself insulin shots.  He does not describe the pain there but does say it feels like a pressure.  Patient is being admitted because they attempted to ambulate him after having replaced electrolytes, and given a small fluid bolus.  Patient became tachycardic, and tachypneic when trying to walk just a few steps.  Baseline for him is being able to walk around the home without stopping for breaks.  ED is concerning the patient needed a PE work-up, but patient has a contrast allergy.  He needs to be premedicated before he can get a CTA which will be done at admission.  Patient does not smoke, does not drink alcohol, does not use illicit drugs.  He is vaccinated for COVID.  Patient is full code.  In the ED Temp 98.1, heart rate 55-94, respiratory rate 12-29, blood pressure 136/49-196/20 O2 sats 97-100% No leukocytosis, hemoglobin 13.3 Potassium is quite low at 2.8 magnesium also low at 1.6 Troponin within normal limits x2 UA is not indicative of UTI CT head shows no acute changes Chest x-ray shows no acute changes EKG shows a heart rate of 71, sinus rhythm, QTc 449 Patient was given Tylenol, Valium, Benadryl, 500 LR bolus, 2 g of mag, 70 mEq of potassium, and Reglan Ambulation challenge as described above Admission for possible syncope  Review of Systems: As  mentioned in the history of present illness. All other systems reviewed and are negative. Past Medical History:  Diagnosis Date   Asymptomatic stenosis of left vertebral artery 2002, 2005   Status post PTA/stent with redo; normal antegrade flow on dopplers 09/2013   CAD (coronary artery disease) 1995   1CABG x3; redo in CABG x 1 2001 Free Radial to LAD; All grafts patent by Cath 08/2014: The proximal to mid LAD has poor retrograde filling from the LIMA due to in-stent restenosis. 50% ostial disease of free radial artery to the LAD.   CAD (coronary artery  disease) of artery bypass graft 2000   PCI x 2 - ostial & prox-mid LAD (BMS) for atretic LIMA-LAD   CAD in native artery 1995   CABG x 3 - LIMA-LAD, SVG-OM2, SVG-rPDA   Chest pain 09/2017   DM (diabetes mellitus) type II controlled peripheral vascular disorder 2002   Left subclavian and left vertebral artery stenoses   Glaucoma    Hyperlipidemia LDL goal <70    Hypertension, essential    S/P CABG x 3 1995    S/P Redo CABG x 1 2001   L Radial-LAD after 2 failed attempts @ LIMA-LAD PTCA; LAD stents 100% occluded   Stenosis of left subclavian artery ([place]) 11/2003   Status post PTA/stent --> < 50 % stenosis by Dopplers 09/2013   Past Surgical History:  Procedure Laterality Date   CARDIAC CATHETERIZATION N/A 08/04/2014   Procedure: Left Heart Cath and Cors/Grafts Angiography;  Surgeon: Leonie Man, MD;  Location: [facility];  Service: Cardiovascular: o-mRCA 70%, m-dRCA ~70% - SVG-dRCA Patent.  o-pLAD 100% stent occluded, mLAD 95% ISR with poor retrograde filling from freeRadiial graft-LAD (50% ostial graft dz).  o-p Cx 80%. Widely patent SVG-Cx-OM fills retrograde to 80% lesion.; Normal LV Fxn & EDP   CORONARY ANGIOPLASTY  April and May 2001   After Both LAD stents occluded - PTCA of anastomatic LIMA-LAD lesion -- Unsuccessful.   CORONARY ARTERY BYPASS GRAFT  1995    LIMA-LAD, SVG-OM2, SVG-rPDA   CORONARY ARTERY BYPASS GRAFT  June 2001   Dr. Lucianne Lei Trigt: Redo LAD grafting with free Left Radial-distal LAD   CORONARY STENT PLACEMENT  1995-2000   2 BMS stents to osital-proximal & proximal-mid LAD; because of atretic LIMA-LDA   LEFT HEART CATH AND CORONARY ANGIOGRAPHY N/A 09/18/2017   Procedure: LEFT HEART CATH AND CORONARY ANGIOGRAPHY;  Surgeon: Martinique, Peter M, MD;  Location: [facility];  Service: Cardiovascular;  Laterality: N/A;   LEFT HEART CATHETERIZATION WITH CORONARY/GRAFT ANGIOGRAM N/A 08/22/2011   Procedure: LEFT HEART CATHETERIZATION WITH Beatrix Fetters;   Surgeon: Leonie Man, MD;  Location: [facility];  Service: Cardiovascular:  Known occluded LIMA-LAD and ostial LAD. Moderate to severe proximal circumflex and RCA disease. Widely patent freeLRAD-dLAD, as well as SVG-RPDA (backfilling RPL), SVG-OM 2 (backfilling OM1)   SHOULDER OPEN ROTATOR CUFF REPAIR  October 2004   Dr. Noemi Chapel   SP Barry VERT OR THOR CAROTID STENT Left October 2002; November 2005   Left Vertebral stent placed in October 2002 (Dr. Patrecia Pour); redo PCI in 2005   [place] Left November 2005   Dr. Gwenlyn Found   TRANSTHORACIC ECHOCARDIOGRAM  04/10/2020   [facility]): Normal LV size and function.  EF 60 to 65%.  GR 1 DD.  Mild AI.  Mild to moderate LA dilation.  GR 1 DD.[name]   Social History:  reports that he has quit smoking. His smoking use  included cigarettes. He has a 90.00 pack-year smoking history. He has never used smokeless tobacco. He reports that he does not drink alcohol and does not use drugs.  Allergies  Allergen Reactions   Iohexol Other (See Comments)    Intractable shaking.   Contrast Media [Iodinated Contrast Media]     Shivering & shaking. Pt reports takes antihistamine prior to procedures.   Metformin And Related Diarrhea    Subsequently discontinued    Family History  Problem Relation Age of Onset   Diabetes Mother    Diabetes Sister    Diabetes Brother     Prior to Admission medications   Medication Sig Start Date End Date Taking? Authorizing Provider  acyclovir (ZOVIRAX) 400 MG tablet Take 400 mg by mouth 2 (two) times daily with a meal.  08/18/17  Yes [provider]  aspirin EC 81 MG tablet Take 81 mg by mouth daily with breakfast.    Yes [provider]  gabapentin (NEURONTIN) 300 MG capsule Take 600 mg by mouth See admin instructions. Take two capsules (600 mg) by mouth three times daily - breakfast, supper and bedtime   Yes [provider]  isosorbide mononitrate (IMDUR) 60 MG 24 hr tablet May take 1  and 1/2 tablets to 2 tablets a day depending on if chest discomfort is present Patient taking differently: 30 mg daily. 11/21/16  Yes Leonie Man, MD  Omega-3 Fatty Acids (FISH OIL) 1000 MG CAPS Take 1,000 mg by mouth 2 (two) times daily with a meal.    Yes [provider]  rosuvastatin (CRESTOR) 40 MG tablet Take 1 tablet (40 mg total) by mouth daily. 05/26/20 02/03/21 Yes Leonie Man, MD  vitamin C (ASCORBIC ACID) 500 MG tablet Take 500 mg by mouth 2 (two) times daily with a meal.    Yes [provider]  acetaminophen (TYLENOL) 500 MG tablet Take 1,000 mg by mouth every 6 (six) hours as needed for mild pain or headache.    [provider]  albuterol (VENTOLIN HFA) 108 (90 Base) MCG/ACT inhaler INHALE TWO PUFFS BY MOUTH FOUR TIMES DAILY 03/04/20   [provider]  amLODipine (NORVASC) 10 MG tablet Take 1 tablet (10 mg total) by mouth daily. 04/30/20 11/16/20  Leonie Man, MD  amLODipine (NORVASC) 2.5 MG tablet Take 1 tablet (2.5 mg total) by mouth as needed. For blood pressure greater than 580 systolic 9/98/33   Leonie Man, MD  Brinzolamide-Brimonidine 1-0.2 % SUSP Place 1 drop into both eyes 3 (three) times daily.    [provider]  Cholecalciferol (VITAMIN D) 2000 units tablet Take 2,000 Units by mouth daily with breakfast.    [provider]  clopidogrel (PLAVIX) 75 MG tablet Take 1 tablet (75 mg total) by mouth daily. 10/24/12   Leonie Man, MD  doxazosin (CARDURA) 8 MG tablet Take 8 mg by mouth at bedtime.     [provider]  fluticasone (FLONASE) 50 MCG/ACT nasal spray Place 1 spray into both nostrils daily as needed (seasonal allergies).  11/17/14   [provider]  GLOBAL INJECT EASE LANCETS 30G MISC DIABETIC CLUB - USE TO CHECK BLOOD GLUCOSE LEVELS 02/23/15   [provider]  glucose blood test strip DIABETIC CLUB - USE TO CHECK BLOOD GLUCOSE LEVELS 02/23/15   [provider]   hydrocortisone cream 1 % Apply 1 application topically daily as needed for itching.    [provider]  latanoprost (XALATAN) 0.005 % ophthalmic  solution Place 1 drop into the right eye at bedtime.     [provider]  loratadine (CLARITIN) 10 MG tablet Take 10 mg by mouth daily as needed (seasonal allergies).     [provider]  metoprolol succinate (TOPROL-XL) 25 MG 24 hr tablet Take 0.5 tablets by mouth daily. 04/11/20   [provider]  nitroGLYCERIN (NITROSTAT) 0.4 MG SL tablet Place 1 tablet (0.4 mg total) under the tongue every 5 (five) minutes as needed for up to 25 days for chest pain. And increase blood pressure greater than 315 systolic 1/76/16 07/37/10  Leonie Man, MD  omeprazole (PRILOSEC) 20 MG capsule Take 20 mg by mouth daily. 09/06/19   [provider]  ondansetron (ZOFRAN) 4 MG tablet Take by mouth. 05/28/20   [provider]  pantoprazole (PROTONIX) 40 MG tablet Take 40 mg by mouth daily with breakfast. 09/14/17   [provider]  prednisoLONE acetate (PRED FORTE) 1 % ophthalmic suspension Place 1 drop into the left eye 2 (two) times daily.    [provider]  timolol (TIMOPTIC) 0.5 % ophthalmic solution Place 1 drop into both eyes daily.     [provider]  TRULICITY 6.26 RS/8.5IO SOPN Inject into the skin. 04/27/20   [provider]    Physical Exam: Vitals:   02/04/21 0049 02/04/21 0100 02/04/21 0200 02/04/21 0300  BP: (!) 180/81 (!) 165/69 (!) 174/74 (!) 149/61  Pulse:  (!) 58 65 62  Resp:  17 19 12   Temp:      TempSrc:      SpO2:  96% 96% 97%  Weight:      Height:       1.  General: Patient lying supine in bed,  no acute distress   2. Psychiatric: Alert and oriented x 3, mood and behavior normal for situation, pleasant and cooperative with exam   3. Neurologic: Speech and language are normal, face is symmetric, moves all 4 extremities voluntarily, at baseline without  acute deficits on limited exam   4. HEENMT:  Head is atraumatic, normocephalic, pupils reactive to light, neck is supple, trachea is midline, mucous membranes are moist   5. Respiratory : Lungs are clear to auscultation bilaterally without wheezing, rhonchi, rales, no cyanosis, no increase in work of breathing or accessory muscle use   6. Cardiovascular : Heart rate normal, rhythm is regular, no murmurs, rubs or gallops, no peripheral edema, peripheral pulses palpated   7. Gastrointestinal:  Abdomen is soft, nondistended, nontender to palpation bowel sounds active, no masses or organomegaly palpated   8. Skin:  Skin is warm, dry and intact without rashes, acute lesions, or ulcers on limited exam   9.Musculoskeletal:  No acute deformities or trauma, no asymmetry in tone, no peripheral edema, peripheral pulses palpated, no tenderness to palpation in the extremities   Data Reviewed: Labs, imaging, EKG reviewed and as listed in the HPI    Assessment and Plan: * Syncope- (present on admission) - Patient collapsed with at least near syncope if not a syncopal event -Failed ambulation with tachycardia and tachypnea -Elevated D-dimer --> currently premedicating for CTA -CT head showed no acute changes -Continue to monitor on telemetry  Elevated d-dimer- (present on admission) - 1.33 -Currently premedicating for CTA chest  Hypomagnesemia- (present on admission) - Magnesium 1.6 -2 g given in the ED -Recheck in the a.m.  Hypokalemia- (present on admission) - Potassium 2.8 -70 mEq of potassium given in the ED -Magnesium also replaced -  Recheck labs in the a.m.  DMII (diabetes mellitus, type 2) ([place]) - Patient reports hyperglycemia at home, glucose on chemistry panel is 121 -Last A1c was near goal, update A1c -Patient takes 20 units of Trulicity at home, continue reduced dose of long-acting insulin with sliding scale coverage -Carb modified diet -Continue to monitor  Essential  hypertension, benign- (present on admission) Continue Cardura, holding Norvasc as it is unclear if the patient is still on this medication -Continue metoprolol  Hyperlipidemia associated with type 2 diabetes mellitus ([place])- (present on admission) Continue statin       Advance Care Planning:   Code Status: DNR   Consults: Consult PT  Family Communication: No family at bedside  Severity of Illness: The appropriate patient status for this patient is OBSERVATION. Observation status is judged to be reasonable and necessary in order to provide the required intensity of service to ensure the patient's safety. The patient's presenting symptoms, physical exam findings, and initial radiographic and laboratory data in the context of their medical condition is felt to place them at decreased risk for further clinical deterioration. Furthermore, it is anticipated that the patient will be medically stable for discharge from the hospital within 2 midnights of admission.   Author: Rolla Plate, DO 02/04/2021 3:21 AM  For on call review [email or web address].

## 2021-02-04 NOTE — ED Notes (Addendum)
Upon entering patient's room, patient had foley bag in place with 44F catheter with no signs of redness, discoloration or discharge around penis. Patient has statlock in place with foley bag free of kinks and hanging below waistline. When asking the patient why he had a foley, patient states he had burning with urination and leaking urine incontinence.

## 2021-02-04 NOTE — ED Notes (Signed)
Provided pt with grape juice and peanut butter and crackers to assist in bringing up glucose level

## 2021-02-04 NOTE — Assessment & Plan Note (Addendum)
-  denies CP -continue metoprolol, Imdur, aspirin and Plavix

## 2021-02-04 NOTE — Assessment & Plan Note (Addendum)
-  Continue Cardura and Continue metoprolol -heart healthy diet discussed with patient

## 2021-02-05 DIAGNOSIS — E876 Hypokalemia: Secondary | ICD-10-CM | POA: Diagnosis not present

## 2021-02-05 DIAGNOSIS — I10 Essential (primary) hypertension: Secondary | ICD-10-CM | POA: Diagnosis not present

## 2021-02-05 DIAGNOSIS — W19XXXA Unspecified fall, initial encounter: Secondary | ICD-10-CM

## 2021-02-05 DIAGNOSIS — K219 Gastro-esophageal reflux disease without esophagitis: Secondary | ICD-10-CM | POA: Diagnosis not present

## 2021-02-05 DIAGNOSIS — R42 Dizziness and giddiness: Secondary | ICD-10-CM | POA: Diagnosis not present

## 2021-02-05 DIAGNOSIS — R338 Other retention of urine: Secondary | ICD-10-CM

## 2021-02-05 DIAGNOSIS — R7989 Other specified abnormal findings of blood chemistry: Secondary | ICD-10-CM | POA: Diagnosis not present

## 2021-02-05 DIAGNOSIS — R55 Syncope and collapse: Secondary | ICD-10-CM | POA: Diagnosis not present

## 2021-02-05 DIAGNOSIS — Z794 Long term (current) use of insulin: Secondary | ICD-10-CM | POA: Diagnosis not present

## 2021-02-05 DIAGNOSIS — E1169 Type 2 diabetes mellitus with other specified complication: Secondary | ICD-10-CM | POA: Diagnosis not present

## 2021-02-05 DIAGNOSIS — E1159 Type 2 diabetes mellitus with other circulatory complications: Secondary | ICD-10-CM | POA: Diagnosis not present

## 2021-02-05 LAB — GLUCOSE, CAPILLARY
Glucose-Capillary: 75 mg/dL (ref 70–99)
Glucose-Capillary: 158 mg/dL — ABNORMAL HIGH (ref 70–99)

## 2021-02-05 LAB — HEMOGLOBIN A1C
Hgb A1c MFr Bld: 9.5 % — ABNORMAL HIGH (ref 4.8–5.6)
Mean Plasma Glucose: 226 mg/dL

## 2021-02-05 MED ORDER — DOXAZOSIN MESYLATE 8 MG PO TABS: 8.0000 mg | ORAL_TABLET | Freq: Every day | ORAL | 1 refills | Status: AC

## 2021-02-05 MED ORDER — PREDNISOLONE ACETATE 1 % OP SUSP
1.0000 [drp] | Freq: Two times a day (BID) | OPHTHALMIC | Status: DC
  Administered 2021-02-05: 1 [drp] via OPHTHALMIC
  Filled 2021-02-05: qty 1

## 2021-02-05 NOTE — Discharge Summary (Signed)
Physician Discharge Summary   Patient: [name] MRN: [id number] DOB: December 24, 1932  Admit date:     02/03/2021  Discharge date: 02/05/21  Discharge Physician: Barton Dubois   PCP: Rory Percy, MD   Recommendations at discharge:  Repeat basic metabolic panel to follow to lites and renal function Reassess blood pressure with further adjustment to antihypertensive regimen as needed. Make sure patient has follow-up with urology service as instructed.   Discharge Diagnoses: Principal Problem:   Syncope Active Problems:   Hyperlipidemia associated with type 2 diabetes mellitus (HCC)   Essential hypertension, benign   CAD -> CABG x3 then Re-DO CABG x1 (LRad-dLAD) after atretic LIMA & LAD stent occlusion.   DMII (diabetes mellitus, type 2) (HCC)   GERD (gastroesophageal reflux disease)   Dizziness   Hypokalemia   Hypomagnesemia   Elevated d-dimer   Acute urinary retention   Fall Chronic diastolic heart failure.  Resolved Problems: Acute urinary retention.   Brief admission narrative: As per H&P written by Dr. Clearence Ped on 02/04/21 [name] is a 88 y.o. 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  Patient reports if he is at home, his blood pressure became very high, and he had a headache.  His headache was located across the front of his forehead which is his normal location for headache.  He reports he went to the bathroom to have a bowel movement and when he stood up he collapsed to the ground.  Patient reports he remembers the whole thing.  He reports that he did not hit his head and he did not lose consciousness.  Family had reported that he did lose consciousness.  He reports being on the ground for several  minutes.  He thinks it is glucose was also elevated at that time but he does not remember how high.  Reports high numbers for him would be over 350.  He did have dizziness that felt like lightheadedness.  He did not notice the room spinning.  Patient reports that recent health concerns have included UTI for which she was taking antibiotics outpatient, and left eye cataract surgery for which she is using drops at home.  Patient reports he has had a normal appetite, and he does not admit to having diarrhea.  He reports he drinks plenty of water and stays hydrated at home.  He does report he has had generalized weakness that has been gradually worsening.  He denies myalgias, no fevers.  He reports he has chronic chest pain ever since having his CABG, with no change in that chest pain.  He feels as though his abdomen may be bloated at the site where he gives himself insulin shots.  He does not describe the pain there but does say it feels like a pressure.  Patient is being admitted because they attempted to ambulate him after having replaced electrolytes, and given a small fluid bolus.  Patient became tachycardic, and tachypneic when trying to walk just a few steps.  Baseline for him is being able to walk around the home without stopping for breaks.  ED is concerning the patient needed a PE work-up, but patient has a contrast allergy.  He needs to be premedicated before he can get a CTA which will be done at admission.  Assessment and Plan: * Syncope- (present on admission) -Patient tachypnea and tachycardia with ambulation resolved and not present at discharge. -No further episodes of near syncope or lightheadedness -2D echo with normal motion abnormalities, preserved ejection fraction and no significant valvular disorder.  Grade 1 diastolic dysfunction was appreciated and the patient will continue outpatient follow-up with cardiology service.  Continue good control of his blood pressure and will follow heart  healthy diet discussed. -Patient's symptoms most likely associated with vagal syncope due to difficulty streaming urine with the presence of BPH. -TSH marginally elevated, with normal free T4.  Repeat thyroid panel in 8 weeks. -D-Dimer was elevated and CT angiogram negative for pulmonary embolism.  Patient denies chest pain, is not hypoxic and demonstrating good respiratory effort.  Elevated d-dimer- (present on admission) -Patient denies chest pain, shortness of breath and found not to be hypoxic. -CT angiogram negative for pulmonary embolism. -patient tolerated preparation for the use of contrast without any problems.  Hypomagnesemia- (present on admission) -Magnesium 1.6 at time of admission -adequately repleted and WNL currently -Repeat magnesium level at follow-up visit to assess electrolytes stability.   Hypokalemia- (present on admission) -Potassium 2.8 on admission -repleted and WNL currently -Repeat with metabolic panel to follow electrolytes stability of follow-up visit.   GERD (gastroesophageal reflux disease)- (present on admission) -continue PPI  DMII (diabetes mellitus, type 2) ([place]) -Patient with mild hypoglycemic event during hospitalization receiving long-acting insulin without oral intake. -After adjusting insulin regimen no further events appreciated. -Discussed with patient he reports experiencing intermittently hypoglycemic events in the morning with his current regimen. -We will recommend adjusting the dose of Trulicity and patient has been encouraged to take a snack before going to bed. -A1c demonstrating poorly controlled diabetes (9.3); but given age and frailty high risk for ongoing symptomatic hypoglycemia with diet control. -Will recommend close follow-up with PCP to further adjust regimen.   CAD -> CABG x3 then Re-DO CABG x1 (LRad-dLAD) after atretic LIMA & LAD stent occlusion.- (present on admission) -denies CP -continue metoprolol, Imdur, aspirin and  Plavix  Essential hypertension, benign- (present on admission) -Continue Cardura and Continue metoprolol -heart healthy diet discussed with patient  Hyperlipidemia associated with type 2 diabetes mellitus ([place])- (present on admission) -will continue statin -Heart healthy diet discussed with patient.  Chronic diastolic heart failure -Compensated -Advised to follow heart healthy diet, continue good control of blood pressure and to check weight on daily basis.   Consultants: None Procedures performed: See below for x-ray reports; 2D echo: Demonstrating preserved ejection fraction, no wall motion abnormalities and no significant valvular disorder.  Grade 1 diastolic dysfunction appreciated.  Disposition: Discharge home with Home health PT. Diet recommendation:  Cardiac and Carb modified diet  DISCHARGE MEDICATION: Allergies as of 02/05/2021       Reactions   Iohexol Other (See Comments)   Intractable shaking.   Contrast Media [iodinated Contrast Media]    Shivering & shaking. Pt reports takes antihistamine prior to procedures.   Metformin And Related Diarrhea   Subsequently discontinued        Medication List     STOP taking these medications    acyclovir 400 MG tablet Commonly known as: ZOVIRAX   amLODipine 2.5 MG tablet Commonly known as: NORVASC   ciprofloxacin 500 MG tablet Commonly known as: CIPRO       TAKE these medications    acetaminophen 500 MG tablet Commonly known as: TYLENOL Take 1,000 mg by mouth every 6 (six) hours as needed for mild  pain or headache.   albuterol 108 (90 Base) MCG/ACT inhaler Commonly known as: VENTOLIN HFA INHALE TWO PUFFS BY MOUTH FOUR TIMES DAILY   aspirin EC 81 MG tablet Take 81 mg by mouth daily with breakfast.   Brinzolamide-Brimonidine 1-0.2 % Susp Place 1 drop into the right eye 3 (three) times daily.   clopidogrel 75 MG tablet Commonly known as: PLAVIX Take 75 mg by mouth daily.   doxazosin 8 MG  tablet Commonly known as: CARDURA Take 1 tablet (8 mg total) by mouth at bedtime.   finasteride 5 MG tablet Commonly known as: PROSCAR Take 5 mg by mouth daily.   Fish Oil 1000 MG Caps Take 1,000 mg by mouth 2 (two) times daily with a meal.   fludrocortisone 0.1 MG tablet Commonly known as: FLORINEF Take 100 mcg by mouth daily.   gabapentin 300 MG capsule Commonly known as: NEURONTIN Take 600 mg by mouth See admin instructions. Take two capsules (600 mg) by mouth three times daily - breakfast, supper and bedtime   hydrocortisone cream 1 % Apply 1 application topically daily as needed for itching.   isosorbide mononitrate 60 MG 24 hr tablet Commonly known as: IMDUR May take 1 and 1/2 tablets to 2 tablets a day depending on if chest discomfort is present What changed:  how much to take when to take this additional instructions   Jardiance 25 MG Tabs tablet Generic drug: empagliflozin Take 25 mg by mouth daily.   nitroGLYCERIN 0.4 MG SL tablet Commonly known as: NITROSTAT Place 1 tablet (0.4 mg total) under the tongue every 5 (five) minutes as needed for up to 25 days for chest pain. And increase blood pressure greater than 389 systolic   omeprazole 20 MG capsule Commonly known as: PRILOSEC Take 20 mg by mouth daily.   prednisoLONE acetate 1 % ophthalmic suspension Commonly known as: PRED FORTE Place 1 drop into both eyes 2 (two) times daily.   rosuvastatin 40 MG tablet Commonly known as: CRESTOR Take 1 tablet (40 mg total) by mouth daily.   timolol 0.5 % ophthalmic solution Commonly known as: TIMOPTIC Place 1 drop into both eyes daily.   Trulicity 3.73 SK/8.7GO Sopn Generic drug: Dulaglutide Inject 20 mg into the skin daily.   vitamin C 500 MG tablet Commonly known as: ASCORBIC ACID Take 500 mg by mouth 2 (two) times daily with a meal.   Vitamin D 50 MCG (2000 UT) tablet Take 2,000 Units by mouth daily with breakfast.        Follow-up Information      Rory Percy, MD. Schedule an appointment as soon as possible for a visit in 10 day(s).   Specialty: Family Medicine Contact information: [address] [place] [address] [phone]         Leonie Man, MD .   Specialty: Cardiology Contact information: [address] [place] [address] [phone]                 Discharge Exam: Danley Danker Weights   02/03/21 1147  Weight: 61.7 kg   General exam: Alert, awake, oriented x 3; afebrile, denying chest pain, no nausea, no vomiting.  No further episodes of syncope or lightheadedness reported. Respiratory system: Clear to auscultation. Respiratory effort normal.  No using accessory muscle.  Good saturation on room air. Cardiovascular system: Rate controlled, no rubs, no gallops, no JVD. Gastrointestinal system: Abdomen is nondistended, soft and nontender. No organomegaly or masses felt. Normal bowel sounds heard.  Central nervous system: Alert and oriented. No focal neurological deficits. Extremities: No cyanosis or clubbing. Skin: No petechiae. Psychiatry: Judgement and insight appear normal. Mood & affect appropriate.    Condition at discharge: Stable and improved.  The results of significant diagnostics from this hospitalization (including imaging, microbiology, ancillary and laboratory) are listed below for reference.   Imaging Studies: CT Head Wo Contrast  Result Date: 02/03/2021 CLINICAL DATA:  Syncope EXAM: CT HEAD WITHOUT CONTRAST TECHNIQUE: Contiguous axial images were obtained from the base of the skull through the vertex without intravenous contrast. RADIATION DOSE REDUCTION: This exam was performed according to the departmental dose-optimization program which includes automated exposure control, adjustment of the mA and/or kV according to patient size and/or use of iterative reconstruction technique. COMPARISON:  CT head 11/18/2020 FINDINGS: Brain: No acute intracranial hemorrhage, mass  effect, or herniation. No extra-axial fluid collections. No evidence of acute territorial infarct. No hydrocephalus. Mild cortical volume loss. Mild patchy hypodensities in the periventricular and subcortical white matter, likely secondary to chronic microvascular ischemic changes. Vascular: No hyperdense vessel or unexpected calcification. Skull: Normal. Negative for fracture or focal lesion. Sinuses/Orbits: No acute finding. Other: None. IMPRESSION: Chronic changes with no acute intracranial process identified. Electronically Signed   By: Ofilia Neas M.D.   On: 02/03/2021 13:42   CT Angio Chest Pulmonary Embolism (PE) W or WO Contrast  Result Date: 02/04/2021 CLINICAL DATA:  Positive D-dimer. Pulmonary embolism suspected. Syncopal episode after sitting on toilet. 4 hour premed given for contrast allergy. EXAM: CT ANGIOGRAPHY CHEST WITH CONTRAST TECHNIQUE: Multidetector CT imaging of the chest was performed using the standard protocol during bolus administration of intravenous contrast. Multiplanar CT image reconstructions and MIPs were obtained to evaluate the vascular anatomy. RADIATION DOSE REDUCTION: This exam was performed according to the departmental dose-optimization program which includes automated exposure control, adjustment of the mA and/or kV according to patient size and/or use of iterative reconstruction technique. CONTRAST:  152mL OMNIPAQUE IOHEXOL 350 MG/ML SOLN COMPARISON:  AP chest 02/03/2021, 09/13/2014; CTA chest 04/21/2011 FINDINGS: Cardiovascular: The main pulmonary artery opacification measures 440 Hounsfield units density. No filling defect is seen to indicate acute pulmonary embolism. Main pulmonary artery is normal in caliber. Heart size is normal. No pericardial effusion. No thoracic aortic aneurysm or aortic dissection. Moderate calcifications within the thoracic aorta. Left subclavian stent is again noted with additional stent again seen at the origin of the takeoff of the  left vertebral artery unchanged. Mediastinum/Nodes: No axillary mediastinal or hilar pathologically enlarged lymph nodes by CT criteria the visualized thyroid is grossly unremarkable. The esophagus follows a normal course of normal caliber. Lungs/Pleura: The central airways are patent. Trace right-greater-than-left pleural effusions with associated posterior lower lobe subsegmental atelectasis new from prior. There is an unchanged tiny bleb within the posterior inferior right lower lobe (axial series 7, image 107). There is again mild interlobular septal thickening/scarring within the bilateral lung apices. Upper Abdomen: Unremarkable. Musculoskeletal: There is a lucent lesion within the T10 vertebral body with associated trabecular thickening consistent with a benign hemangioma, not significantly changed from 04/21/2011 CT. Mild multilevel degenerative disc changes of the thoracic spine. Status post median sternotomy. Review of the MIP images confirms the above findings. IMPRESSION:: IMPRESSION: 1. No pulmonary embolism is seen. 2. Trace right-greater-than-left pleural effusions new from prior. Associated bilateral lower lung subsegmental atelectasis. Electronically Signed   By: Yvonne Kendall M.D.   On: 02/04/2021 09:02   DG Chest Port 1 View  Result Date: 02/03/2021 CLINICAL DATA:  Status post fall, syncopal episode EXAM: PORTABLE CHEST 1 VIEW COMPARISON:  07/25/2020 FINDINGS: Mild bilateral chronic interstitial thickening. No focal consolidation. No pleural effusion or pneumothorax. Heart and mediastinal contours are unremarkable. Prior CABG. No acute osseous abnormality. IMPRESSION: No active disease. Electronically Signed   By: Kathreen Devoid M.D.   On: 02/03/2021 12:36   ECHOCARDIOGRAM COMPLETE  Result Date: 02/04/2021    ECHOCARDIOGRAM REPORT   Patient Name:   KASHEEM TONER Date of Exam: 02/04/2021 Medical Rec #:  [id number]       Height:       67.0 in Accession #:    [id number]      Weight:       136.0 lb  Date of Birth:  December 09, 1932       BSA:          1.717 m Patient Age:    52 years        BP:           154/96 mmHg Patient Gender: M               HR:           82 bpm. Exam Location:  [facility] Procedure: 2D Echo, Cardiac Doppler and Color Doppler Indications:    Syncope  History:        Patient has prior history of Echocardiogram examinations, most                 recent 08/03/2014. CAD, Prior CABG; Risk Factors:Hypertension,                 Diabetes and Dyslipidemia.  Sonographer:    Wenda Low Referring Phys: [place]  1. Left ventricular ejection fraction, by estimation, is 70 to 75%. The left ventricle has hyperdynamic function. The left ventricle has no regional wall motion abnormalities. Left ventricular diastolic parameters are consistent with Grade I diastolic dysfunction (impaired relaxation).  2. Right ventricular systolic function is normal. The right ventricular size is normal. There is normal pulmonary artery systolic pressure.  3. The mitral valve is normal in structure. No evidence of mitral valve regurgitation. No evidence of mitral stenosis.  4. The aortic valve is normal in structure. Aortic valve regurgitation is trivial. No aortic stenosis is present.  5. The inferior vena cava is normal in size with greater than 50% respiratory variability, suggesting right atrial pressure of 3 mmHg. Comparison(s): Prior images reviewed side by side. FINDINGS  Left Ventricle: Left ventricular ejection fraction, by estimation, is 70 to 75%. The left ventricle has hyperdynamic function. The left ventricle has no regional wall motion abnormalities. The left ventricular internal cavity size was normal in size. There is no left ventricular hypertrophy. Left ventricular diastolic parameters are consistent with Grade I diastolic dysfunction (impaired relaxation). Right Ventricle: The right ventricular size is normal. No increase in right ventricular wall thickness. Right ventricular systolic  function is normal. There is normal pulmonary artery systolic pressure. The tricuspid regurgitant velocity is 2.31 m/s, and  with an assumed right atrial pressure of 3 mmHg, the estimated right ventricular systolic pressure is 77.4 mmHg. Left Atrium: Left atrial size was normal in size. Right Atrium: Right atrial size was normal in size. Pericardium: There is no evidence of pericardial effusion. Mitral Valve: The mitral valve is normal in structure. No evidence of mitral valve regurgitation. No evidence of mitral valve stenosis. MV peak gradient, 4.7 mmHg. The mean mitral valve gradient is 2.0 mmHg. Tricuspid Valve:  The tricuspid valve is normal in structure. Tricuspid valve regurgitation is trivial. No evidence of tricuspid stenosis. Aortic Valve: The aortic valve is normal in structure. Aortic valve regurgitation is trivial. No aortic stenosis is present. Aortic valve mean gradient measures 6.0 mmHg. Aortic valve peak gradient measures 12.7 mmHg. Aortic valve area, by VTI measures 2.04 cm. Pulmonic Valve: The pulmonic valve was normal in structure. Pulmonic valve regurgitation is not visualized. No evidence of pulmonic stenosis. Aorta: The aortic root is normal in size and structure. Venous: The inferior vena cava is normal in size with greater than 50% respiratory variability, suggesting right atrial pressure of 3 mmHg. IAS/Shunts: No atrial level shunt detected by color flow Doppler.  LEFT VENTRICLE PLAX 2D LVIDd:         4.10 cm     Diastology LVIDs:         1.70 cm     LV e' medial:    5.77 cm/s LV PW:         1.00 cm     LV E/e' medial:  11.0 LV IVS:        1.10 cm     LV e' lateral:   10.30 cm/s LVOT diam:     1.90 cm     LV E/e' lateral: 6.2 LV SV:         82 LV SV Index:   48 LVOT Area:     2.84 cm  LV Volumes (MOD) LV vol d, MOD A2C: 42.7 ml LV vol d, MOD A4C: 50.1 ml LV vol s, MOD A2C: 15.2 ml LV vol s, MOD A4C: 21.3 ml LV SV MOD A2C:     27.5 ml LV SV MOD A4C:     50.1 ml LV SV MOD BP:      27.5 ml  RIGHT VENTRICLE RV Basal diam:  2.20 cm RV Mid diam:    2.10 cm RV S prime:     11.30 cm/s TAPSE (M-mode): 1.7 cm LEFT ATRIUM             Index        RIGHT ATRIUM           Index LA diam:        4.00 cm 2.33 cm/m   RA Area:     10.50 cm LA Vol (A2C):   59.1 ml 34.43 ml/m  RA Volume:   20.20 ml  11.77 ml/m LA Vol (A4C):   48.5 ml 28.25 ml/m LA Biplane Vol: 54.0 ml 31.46 ml/m  AORTIC VALVE                     PULMONIC VALVE AV Area (Vmax):    2.13 cm      PV Vmax:       1.12 m/s AV Area (Vmean):   2.22 cm      PV Peak grad:  5.0 mmHg AV Area (VTI):     2.04 cm AV Vmax:           178.00 cm/s AV Vmean:          111.000 cm/s AV VTI:            0.404 m AV Peak Grad:      12.7 mmHg AV Mean Grad:      6.0 mmHg LVOT Vmax:         134.00 cm/s LVOT Vmean:        87.000 cm/s LVOT VTI:  0.290 m LVOT/AV VTI ratio: 0.72  AORTA Ao Root diam: 2.80 cm Ao Asc diam:  2.80 cm MITRAL VALVE                TRICUSPID VALVE MV Area (PHT): 3.54 cm     TR Peak grad:   21.3 mmHg MV Area VTI:   3.92 cm     TR Vmax:        231.00 cm/s MV Peak grad:  4.7 mmHg MV Mean grad:  2.0 mmHg     SHUNTS MV Vmax:       1.08 m/s     Systemic VTI:  0.29 m MV Vmean:      63.4 cm/s    Systemic Diam: 1.90 cm MV Decel Time: 214 msec MV E velocity: 63.40 cm/s MV A velocity: 104.00 cm/s MV E/A ratio:  0.61 Candee Furbish MD Electronically signed by Candee Furbish MD Signature Date/Time: 02/04/2021/10:35:20 AM    Final     Microbiology: Results for orders placed or performed during the hospital encounter of 02/03/21  Resp Panel by RT-PCR (Flu A&B, Covid) Nasopharyngeal Swab     Status: None   Collection Time: 02/03/21  8:59 PM   Specimen: Nasopharyngeal Swab; Nasopharyngeal(NP) swabs in vial transport medium  Result Value Ref Range Status   SARS Coronavirus 2 by RT PCR NEGATIVE NEGATIVE Final    Comment: (NOTE) SARS-CoV-2 target nucleic acids are NOT DETECTED.  The SARS-CoV-2 RNA is generally detectable in upper respiratory specimens during  the acute phase of infection. The lowest concentration of SARS-CoV-2 viral copies this assay can detect is 138 copies/mL. A negative result does not preclude SARS-Cov-2 infection and should not be used as the sole basis for treatment or other patient management decisions. A negative result may occur with  improper specimen collection/handling, submission of specimen other than nasopharyngeal swab, presence of viral mutation(s) within the areas targeted by this assay, and inadequate number of viral copies(<138 copies/mL). A negative result must be combined with clinical observations, patient history, and epidemiological information. The expected result is Negative.  Fact Sheet for Patients:  EntrepreneurPulse.com.au  Fact Sheet for Healthcare Providers:  IncredibleEmployment.be  This test is no t yet approved or cleared by the [place] FDA and  has been authorized for detection and/or diagnosis of SARS-CoV-2 by FDA under an Emergency Use Authorization (EUA). This EUA will remain  in effect (meaning this test can be used) for the duration of the COVID-19 declaration under Section 564(b)(1) of the Act, 21 U.S.C.section 360bbb-3(b)(1), unless the authorization is terminated  or revoked sooner.       Influenza A by PCR NEGATIVE NEGATIVE Final   Influenza B by PCR NEGATIVE NEGATIVE Final    Comment: (NOTE) The Xpert Xpress SARS-CoV-2/FLU/RSV plus assay is intended as an aid in the diagnosis of influenza from Nasopharyngeal swab specimens and should not be used as a sole basis for treatment. Nasal washings and aspirates are unacceptable for Xpert Xpress SARS-CoV-2/FLU/RSV testing.  Fact Sheet for Patients: EntrepreneurPulse.com.au  Fact Sheet for Healthcare Providers: IncredibleEmployment.be  This test is not yet approved or cleared by the [place] FDA and has been authorized for detection and/or  diagnosis of SARS-CoV-2 by FDA under an Emergency Use Authorization (EUA). This EUA will remain in effect (meaning this test can be used) for the duration of the COVID-19 declaration under Section 564(b)(1) of the Act, 21 U.S.C. section 360bbb-3(b)(1), unless the authorization is terminated or revoked.  Performed at [facility],  [address], [place], [place] [address]   MRSA Next Gen by PCR, Nasal     Status: None   Collection Time: 02/04/21 12:10 PM   Specimen: Nasal Mucosa; Nasal Swab  Result Value Ref Range Status   MRSA by PCR Next Gen NOT DETECTED NOT DETECTED Final    Comment: (NOTE) The GeneXpert MRSA Assay (FDA approved for NASAL specimens only), is one component of a comprehensive MRSA colonization surveillance program. It is not intended to diagnose MRSA infection nor to guide or monitor treatment for MRSA infections. Test performance is not FDA approved in patients less than 48 years old. Performed at [facility] [facility], [address], [place], [place] [address]     Labs: CBC: Recent Labs  Lab 02/03/21 1235 02/04/21 0347  WBC 8.0 6.8  NEUTROABS 6.0 4.2  HGB 13.3 12.6*  HCT 39.2 37.1*  MCV 91.0 90.7  PLT 163 098   Basic Metabolic Panel: Recent Labs  Lab 02/03/21 1235 02/04/21 0347  NA 141 140  K 2.8* 3.6  CL 115* 111  CO2 19* 22  GLUCOSE 121* 84  BUN 13 12  CREATININE 0.83 0.90  CALCIUM 8.8* 8.3*  MG 1.6* 2.0   Liver Function Tests: Recent Labs  Lab 02/03/21 1235 02/04/21 0347  AST 35 24  ALT 28 22  ALKPHOS 42 32*  BILITOT 0.7 0.4  PROT 6.1* 5.7*  ALBUMIN 3.5 3.2*   CBG: Recent Labs  Lab 02/04/21 0640 02/04/21 1620 02/04/21 2120 02/05/21 0728 02/05/21 1102  GLUCAP 118* 127* 170* 75 158*    Discharge time spent: greater than 30 minutes.  Signed: Barton Dubois, MD [facility] 02/05/2021

## 2021-02-05 NOTE — Progress Notes (Signed)
Patient admitted for syncope. PT evaluated and recommends HHPT. Discussed [facility] providers. Patient referred to and accepted by [facility] with [facility].   Jemaine Prokop, Clydene Pugh, LCSW

## 2021-02-05 NOTE — Care Management Obs Status (Signed)
[facility] NOTIFICATION   Patient Details  Name: [name] MRN: [id number] Date of Birth: Jan 24, 1932   Medicare Observation Status Notification Given:  Yes    Iona Beard, [place] 02/05/2021, 9:16 AM

## 2021-02-05 NOTE — Progress Notes (Signed)
Pt foley cath removed per MD order this morning. Pt has been ambulating to the bathroom and urinating with no complaints. IV removed prior to discharge, tolerated well. Taken to main entrance for discharge via w/c with daughter.

## 2021-02-08 DIAGNOSIS — R3916 Straining to void: Secondary | ICD-10-CM | POA: Diagnosis not present

## 2021-02-08 DIAGNOSIS — Z87891 Personal history of nicotine dependence: Secondary | ICD-10-CM | POA: Diagnosis not present

## 2021-02-08 DIAGNOSIS — Z7982 Long term (current) use of aspirin: Secondary | ICD-10-CM | POA: Diagnosis not present

## 2021-02-08 DIAGNOSIS — I5032 Chronic diastolic (congestive) heart failure: Secondary | ICD-10-CM | POA: Diagnosis not present

## 2021-02-08 DIAGNOSIS — Z9181 History of falling: Secondary | ICD-10-CM | POA: Diagnosis not present

## 2021-02-08 DIAGNOSIS — E1151 Type 2 diabetes mellitus with diabetic peripheral angiopathy without gangrene: Secondary | ICD-10-CM | POA: Diagnosis not present

## 2021-02-08 DIAGNOSIS — I110 Hypertensive heart disease with heart failure: Secondary | ICD-10-CM | POA: Diagnosis not present

## 2021-02-08 DIAGNOSIS — I2510 Atherosclerotic heart disease of native coronary artery without angina pectoris: Secondary | ICD-10-CM | POA: Diagnosis not present

## 2021-02-08 DIAGNOSIS — E785 Hyperlipidemia, unspecified: Secondary | ICD-10-CM | POA: Diagnosis not present

## 2021-02-08 DIAGNOSIS — E876 Hypokalemia: Secondary | ICD-10-CM | POA: Diagnosis not present

## 2021-02-08 DIAGNOSIS — K219 Gastro-esophageal reflux disease without esophagitis: Secondary | ICD-10-CM | POA: Diagnosis not present

## 2021-02-08 DIAGNOSIS — Z7902 Long term (current) use of antithrombotics/antiplatelets: Secondary | ICD-10-CM | POA: Diagnosis not present

## 2021-02-08 DIAGNOSIS — Z87898 Personal history of other specified conditions: Secondary | ICD-10-CM | POA: Diagnosis not present

## 2021-02-08 DIAGNOSIS — E11649 Type 2 diabetes mellitus with hypoglycemia without coma: Secondary | ICD-10-CM | POA: Diagnosis not present

## 2021-02-08 DIAGNOSIS — I708 Atherosclerosis of other arteries: Secondary | ICD-10-CM | POA: Diagnosis not present

## 2021-02-08 DIAGNOSIS — Z6821 Body mass index (BMI) 21.0-21.9, adult: Secondary | ICD-10-CM | POA: Diagnosis not present

## 2021-02-08 DIAGNOSIS — E1169 Type 2 diabetes mellitus with other specified complication: Secondary | ICD-10-CM | POA: Diagnosis not present

## 2021-02-08 DIAGNOSIS — R109 Unspecified abdominal pain: Secondary | ICD-10-CM | POA: Diagnosis not present

## 2021-02-08 DIAGNOSIS — H409 Unspecified glaucoma: Secondary | ICD-10-CM | POA: Diagnosis not present

## 2021-02-08 DIAGNOSIS — Z951 Presence of aortocoronary bypass graft: Secondary | ICD-10-CM | POA: Diagnosis not present

## 2021-02-08 DIAGNOSIS — Z87440 Personal history of urinary (tract) infections: Secondary | ICD-10-CM | POA: Diagnosis not present

## 2021-02-08 DIAGNOSIS — Z7984 Long term (current) use of oral hypoglycemic drugs: Secondary | ICD-10-CM | POA: Diagnosis not present

## 2021-02-10 DIAGNOSIS — R81 Glycosuria: Secondary | ICD-10-CM | POA: Diagnosis not present

## 2021-02-10 DIAGNOSIS — R3915 Urgency of urination: Secondary | ICD-10-CM | POA: Diagnosis not present

## 2021-02-10 DIAGNOSIS — R350 Frequency of micturition: Secondary | ICD-10-CM | POA: Diagnosis not present

## 2021-02-12 DIAGNOSIS — E1151 Type 2 diabetes mellitus with diabetic peripheral angiopathy without gangrene: Secondary | ICD-10-CM | POA: Diagnosis not present

## 2021-02-12 DIAGNOSIS — R3916 Straining to void: Secondary | ICD-10-CM | POA: Diagnosis not present

## 2021-02-12 DIAGNOSIS — Z87891 Personal history of nicotine dependence: Secondary | ICD-10-CM | POA: Diagnosis not present

## 2021-02-12 DIAGNOSIS — Z87440 Personal history of urinary (tract) infections: Secondary | ICD-10-CM | POA: Diagnosis not present

## 2021-02-12 DIAGNOSIS — H409 Unspecified glaucoma: Secondary | ICD-10-CM | POA: Diagnosis not present

## 2021-02-12 DIAGNOSIS — I5032 Chronic diastolic (congestive) heart failure: Secondary | ICD-10-CM | POA: Diagnosis not present

## 2021-02-12 DIAGNOSIS — K219 Gastro-esophageal reflux disease without esophagitis: Secondary | ICD-10-CM | POA: Diagnosis not present

## 2021-02-12 DIAGNOSIS — E11649 Type 2 diabetes mellitus with hypoglycemia without coma: Secondary | ICD-10-CM | POA: Diagnosis not present

## 2021-02-12 DIAGNOSIS — Z9181 History of falling: Secondary | ICD-10-CM | POA: Diagnosis not present

## 2021-02-12 DIAGNOSIS — E785 Hyperlipidemia, unspecified: Secondary | ICD-10-CM | POA: Diagnosis not present

## 2021-02-12 DIAGNOSIS — E1169 Type 2 diabetes mellitus with other specified complication: Secondary | ICD-10-CM | POA: Diagnosis not present

## 2021-02-12 DIAGNOSIS — I110 Hypertensive heart disease with heart failure: Secondary | ICD-10-CM | POA: Diagnosis not present

## 2021-02-12 DIAGNOSIS — Z7902 Long term (current) use of antithrombotics/antiplatelets: Secondary | ICD-10-CM | POA: Diagnosis not present

## 2021-02-12 DIAGNOSIS — Z7984 Long term (current) use of oral hypoglycemic drugs: Secondary | ICD-10-CM | POA: Diagnosis not present

## 2021-02-12 DIAGNOSIS — I2510 Atherosclerotic heart disease of native coronary artery without angina pectoris: Secondary | ICD-10-CM | POA: Diagnosis not present

## 2021-02-12 DIAGNOSIS — Z951 Presence of aortocoronary bypass graft: Secondary | ICD-10-CM | POA: Diagnosis not present

## 2021-02-12 DIAGNOSIS — Z7982 Long term (current) use of aspirin: Secondary | ICD-10-CM | POA: Diagnosis not present

## 2021-02-12 DIAGNOSIS — I708 Atherosclerosis of other arteries: Secondary | ICD-10-CM | POA: Diagnosis not present

## 2021-02-15 DIAGNOSIS — Z6821 Body mass index (BMI) 21.0-21.9, adult: Secondary | ICD-10-CM | POA: Diagnosis not present

## 2021-02-15 DIAGNOSIS — Z87898 Personal history of other specified conditions: Secondary | ICD-10-CM | POA: Diagnosis not present

## 2021-02-15 DIAGNOSIS — E876 Hypokalemia: Secondary | ICD-10-CM | POA: Diagnosis not present

## 2021-02-15 DIAGNOSIS — R109 Unspecified abdominal pain: Secondary | ICD-10-CM | POA: Diagnosis not present

## 2021-02-16 DIAGNOSIS — Z7982 Long term (current) use of aspirin: Secondary | ICD-10-CM | POA: Diagnosis not present

## 2021-02-16 DIAGNOSIS — K219 Gastro-esophageal reflux disease without esophagitis: Secondary | ICD-10-CM | POA: Diagnosis not present

## 2021-02-16 DIAGNOSIS — E1151 Type 2 diabetes mellitus with diabetic peripheral angiopathy without gangrene: Secondary | ICD-10-CM | POA: Diagnosis not present

## 2021-02-16 DIAGNOSIS — Z7902 Long term (current) use of antithrombotics/antiplatelets: Secondary | ICD-10-CM | POA: Diagnosis not present

## 2021-02-16 DIAGNOSIS — E1169 Type 2 diabetes mellitus with other specified complication: Secondary | ICD-10-CM | POA: Diagnosis not present

## 2021-02-16 DIAGNOSIS — Z87440 Personal history of urinary (tract) infections: Secondary | ICD-10-CM | POA: Diagnosis not present

## 2021-02-16 DIAGNOSIS — H409 Unspecified glaucoma: Secondary | ICD-10-CM | POA: Diagnosis not present

## 2021-02-16 DIAGNOSIS — Z951 Presence of aortocoronary bypass graft: Secondary | ICD-10-CM | POA: Diagnosis not present

## 2021-02-16 DIAGNOSIS — R3916 Straining to void: Secondary | ICD-10-CM | POA: Diagnosis not present

## 2021-02-16 DIAGNOSIS — I110 Hypertensive heart disease with heart failure: Secondary | ICD-10-CM | POA: Diagnosis not present

## 2021-02-16 DIAGNOSIS — E785 Hyperlipidemia, unspecified: Secondary | ICD-10-CM | POA: Diagnosis not present

## 2021-02-16 DIAGNOSIS — I5032 Chronic diastolic (congestive) heart failure: Secondary | ICD-10-CM | POA: Diagnosis not present

## 2021-02-16 DIAGNOSIS — Z87891 Personal history of nicotine dependence: Secondary | ICD-10-CM | POA: Diagnosis not present

## 2021-02-16 DIAGNOSIS — E11649 Type 2 diabetes mellitus with hypoglycemia without coma: Secondary | ICD-10-CM | POA: Diagnosis not present

## 2021-02-16 DIAGNOSIS — Z9181 History of falling: Secondary | ICD-10-CM | POA: Diagnosis not present

## 2021-02-16 DIAGNOSIS — I2510 Atherosclerotic heart disease of native coronary artery without angina pectoris: Secondary | ICD-10-CM | POA: Diagnosis not present

## 2021-02-16 DIAGNOSIS — I708 Atherosclerosis of other arteries: Secondary | ICD-10-CM | POA: Diagnosis not present

## 2021-02-16 DIAGNOSIS — Z7984 Long term (current) use of oral hypoglycemic drugs: Secondary | ICD-10-CM | POA: Diagnosis not present

## 2021-02-18 DIAGNOSIS — H409 Unspecified glaucoma: Secondary | ICD-10-CM | POA: Diagnosis not present

## 2021-02-18 DIAGNOSIS — I110 Hypertensive heart disease with heart failure: Secondary | ICD-10-CM | POA: Diagnosis not present

## 2021-02-18 DIAGNOSIS — Z87440 Personal history of urinary (tract) infections: Secondary | ICD-10-CM | POA: Diagnosis not present

## 2021-02-18 DIAGNOSIS — I708 Atherosclerosis of other arteries: Secondary | ICD-10-CM | POA: Diagnosis not present

## 2021-02-18 DIAGNOSIS — R3916 Straining to void: Secondary | ICD-10-CM | POA: Diagnosis not present

## 2021-02-18 DIAGNOSIS — K219 Gastro-esophageal reflux disease without esophagitis: Secondary | ICD-10-CM | POA: Diagnosis not present

## 2021-02-18 DIAGNOSIS — Z7982 Long term (current) use of aspirin: Secondary | ICD-10-CM | POA: Diagnosis not present

## 2021-02-18 DIAGNOSIS — Z7984 Long term (current) use of oral hypoglycemic drugs: Secondary | ICD-10-CM | POA: Diagnosis not present

## 2021-02-18 DIAGNOSIS — Z87891 Personal history of nicotine dependence: Secondary | ICD-10-CM | POA: Diagnosis not present

## 2021-02-18 DIAGNOSIS — E1169 Type 2 diabetes mellitus with other specified complication: Secondary | ICD-10-CM | POA: Diagnosis not present

## 2021-02-18 DIAGNOSIS — E785 Hyperlipidemia, unspecified: Secondary | ICD-10-CM | POA: Diagnosis not present

## 2021-02-18 DIAGNOSIS — Z951 Presence of aortocoronary bypass graft: Secondary | ICD-10-CM | POA: Diagnosis not present

## 2021-02-18 DIAGNOSIS — I5032 Chronic diastolic (congestive) heart failure: Secondary | ICD-10-CM | POA: Diagnosis not present

## 2021-02-18 DIAGNOSIS — E11649 Type 2 diabetes mellitus with hypoglycemia without coma: Secondary | ICD-10-CM | POA: Diagnosis not present

## 2021-02-18 DIAGNOSIS — Z9181 History of falling: Secondary | ICD-10-CM | POA: Diagnosis not present

## 2021-02-18 DIAGNOSIS — I2510 Atherosclerotic heart disease of native coronary artery without angina pectoris: Secondary | ICD-10-CM | POA: Diagnosis not present

## 2021-02-18 DIAGNOSIS — Z7902 Long term (current) use of antithrombotics/antiplatelets: Secondary | ICD-10-CM | POA: Diagnosis not present

## 2021-02-18 DIAGNOSIS — E1151 Type 2 diabetes mellitus with diabetic peripheral angiopathy without gangrene: Secondary | ICD-10-CM | POA: Diagnosis not present

## 2021-02-22 DIAGNOSIS — Z9181 History of falling: Secondary | ICD-10-CM | POA: Diagnosis not present

## 2021-02-22 DIAGNOSIS — K219 Gastro-esophageal reflux disease without esophagitis: Secondary | ICD-10-CM | POA: Diagnosis not present

## 2021-02-22 DIAGNOSIS — E785 Hyperlipidemia, unspecified: Secondary | ICD-10-CM | POA: Diagnosis not present

## 2021-02-22 DIAGNOSIS — I110 Hypertensive heart disease with heart failure: Secondary | ICD-10-CM | POA: Diagnosis not present

## 2021-02-22 DIAGNOSIS — Z7902 Long term (current) use of antithrombotics/antiplatelets: Secondary | ICD-10-CM | POA: Diagnosis not present

## 2021-02-22 DIAGNOSIS — Z87891 Personal history of nicotine dependence: Secondary | ICD-10-CM | POA: Diagnosis not present

## 2021-02-22 DIAGNOSIS — E11649 Type 2 diabetes mellitus with hypoglycemia without coma: Secondary | ICD-10-CM | POA: Diagnosis not present

## 2021-02-22 DIAGNOSIS — Z7984 Long term (current) use of oral hypoglycemic drugs: Secondary | ICD-10-CM | POA: Diagnosis not present

## 2021-02-22 DIAGNOSIS — I708 Atherosclerosis of other arteries: Secondary | ICD-10-CM | POA: Diagnosis not present

## 2021-02-22 DIAGNOSIS — Z7982 Long term (current) use of aspirin: Secondary | ICD-10-CM | POA: Diagnosis not present

## 2021-02-22 DIAGNOSIS — Z87440 Personal history of urinary (tract) infections: Secondary | ICD-10-CM | POA: Diagnosis not present

## 2021-02-22 DIAGNOSIS — E1151 Type 2 diabetes mellitus with diabetic peripheral angiopathy without gangrene: Secondary | ICD-10-CM | POA: Diagnosis not present

## 2021-02-22 DIAGNOSIS — I2510 Atherosclerotic heart disease of native coronary artery without angina pectoris: Secondary | ICD-10-CM | POA: Diagnosis not present

## 2021-02-22 DIAGNOSIS — H409 Unspecified glaucoma: Secondary | ICD-10-CM | POA: Diagnosis not present

## 2021-02-22 DIAGNOSIS — Z951 Presence of aortocoronary bypass graft: Secondary | ICD-10-CM | POA: Diagnosis not present

## 2021-02-22 DIAGNOSIS — E1169 Type 2 diabetes mellitus with other specified complication: Secondary | ICD-10-CM | POA: Diagnosis not present

## 2021-02-22 DIAGNOSIS — R3916 Straining to void: Secondary | ICD-10-CM | POA: Diagnosis not present

## 2021-02-22 DIAGNOSIS — I5032 Chronic diastolic (congestive) heart failure: Secondary | ICD-10-CM | POA: Diagnosis not present

## 2021-02-24 DIAGNOSIS — Z87440 Personal history of urinary (tract) infections: Secondary | ICD-10-CM | POA: Diagnosis not present

## 2021-02-24 DIAGNOSIS — K219 Gastro-esophageal reflux disease without esophagitis: Secondary | ICD-10-CM | POA: Diagnosis not present

## 2021-02-24 DIAGNOSIS — E785 Hyperlipidemia, unspecified: Secondary | ICD-10-CM | POA: Diagnosis not present

## 2021-02-24 DIAGNOSIS — E11649 Type 2 diabetes mellitus with hypoglycemia without coma: Secondary | ICD-10-CM | POA: Diagnosis not present

## 2021-02-24 DIAGNOSIS — Z87891 Personal history of nicotine dependence: Secondary | ICD-10-CM | POA: Diagnosis not present

## 2021-02-24 DIAGNOSIS — E1169 Type 2 diabetes mellitus with other specified complication: Secondary | ICD-10-CM | POA: Diagnosis not present

## 2021-02-24 DIAGNOSIS — Z7984 Long term (current) use of oral hypoglycemic drugs: Secondary | ICD-10-CM | POA: Diagnosis not present

## 2021-02-24 DIAGNOSIS — Z7902 Long term (current) use of antithrombotics/antiplatelets: Secondary | ICD-10-CM | POA: Diagnosis not present

## 2021-02-24 DIAGNOSIS — I110 Hypertensive heart disease with heart failure: Secondary | ICD-10-CM | POA: Diagnosis not present

## 2021-02-24 DIAGNOSIS — Z951 Presence of aortocoronary bypass graft: Secondary | ICD-10-CM | POA: Diagnosis not present

## 2021-02-24 DIAGNOSIS — I2510 Atherosclerotic heart disease of native coronary artery without angina pectoris: Secondary | ICD-10-CM | POA: Diagnosis not present

## 2021-02-24 DIAGNOSIS — R3916 Straining to void: Secondary | ICD-10-CM | POA: Diagnosis not present

## 2021-02-24 DIAGNOSIS — Z7982 Long term (current) use of aspirin: Secondary | ICD-10-CM | POA: Diagnosis not present

## 2021-02-24 DIAGNOSIS — Z9181 History of falling: Secondary | ICD-10-CM | POA: Diagnosis not present

## 2021-02-24 DIAGNOSIS — I5032 Chronic diastolic (congestive) heart failure: Secondary | ICD-10-CM | POA: Diagnosis not present

## 2021-02-24 DIAGNOSIS — E1151 Type 2 diabetes mellitus with diabetic peripheral angiopathy without gangrene: Secondary | ICD-10-CM | POA: Diagnosis not present

## 2021-02-24 DIAGNOSIS — I708 Atherosclerosis of other arteries: Secondary | ICD-10-CM | POA: Diagnosis not present

## 2021-02-24 DIAGNOSIS — H409 Unspecified glaucoma: Secondary | ICD-10-CM | POA: Diagnosis not present

## 2021-03-01 DIAGNOSIS — E876 Hypokalemia: Secondary | ICD-10-CM | POA: Diagnosis not present

## 2021-03-01 DIAGNOSIS — Z87898 Personal history of other specified conditions: Secondary | ICD-10-CM | POA: Diagnosis not present

## 2021-03-01 DIAGNOSIS — Z0001 Encounter for general adult medical examination with abnormal findings: Secondary | ICD-10-CM | POA: Diagnosis not present

## 2021-03-01 DIAGNOSIS — R109 Unspecified abdominal pain: Secondary | ICD-10-CM | POA: Diagnosis not present

## 2021-03-01 DIAGNOSIS — E1165 Type 2 diabetes mellitus with hyperglycemia: Secondary | ICD-10-CM | POA: Diagnosis not present

## 2021-03-01 DIAGNOSIS — Z23 Encounter for immunization: Secondary | ICD-10-CM | POA: Diagnosis not present

## 2021-03-01 DIAGNOSIS — Z6821 Body mass index (BMI) 21.0-21.9, adult: Secondary | ICD-10-CM | POA: Diagnosis not present

## 2021-03-02 DIAGNOSIS — I10 Essential (primary) hypertension: Secondary | ICD-10-CM | POA: Diagnosis not present

## 2021-03-02 DIAGNOSIS — E1165 Type 2 diabetes mellitus with hyperglycemia: Secondary | ICD-10-CM | POA: Diagnosis not present

## 2021-03-16 DIAGNOSIS — E1165 Type 2 diabetes mellitus with hyperglycemia: Secondary | ICD-10-CM | POA: Diagnosis not present

## 2021-03-16 DIAGNOSIS — E876 Hypokalemia: Secondary | ICD-10-CM | POA: Diagnosis not present

## 2021-03-16 DIAGNOSIS — R109 Unspecified abdominal pain: Secondary | ICD-10-CM | POA: Diagnosis not present

## 2021-03-16 DIAGNOSIS — Z6821 Body mass index (BMI) 21.0-21.9, adult: Secondary | ICD-10-CM | POA: Diagnosis not present

## 2021-03-16 DIAGNOSIS — Z87898 Personal history of other specified conditions: Secondary | ICD-10-CM | POA: Diagnosis not present

## 2021-03-19 ENCOUNTER — Other Ambulatory Visit: Payer: Self-pay

## 2021-03-19 ENCOUNTER — Ambulatory Visit: Payer: Medicare Other | Admitting: Cardiology

## 2021-03-19 ENCOUNTER — Encounter: Payer: Self-pay | Admitting: Cardiology

## 2021-03-19 VITALS — BP 120/66 | HR 76 | Ht 66.0 in | Wt 125.0 lb

## 2021-03-19 DIAGNOSIS — R55 Syncope and collapse: Secondary | ICD-10-CM

## 2021-03-19 DIAGNOSIS — I951 Orthostatic hypotension: Secondary | ICD-10-CM

## 2021-03-19 DIAGNOSIS — Z951 Presence of aortocoronary bypass graft: Secondary | ICD-10-CM | POA: Diagnosis not present

## 2021-03-19 DIAGNOSIS — R634 Abnormal weight loss: Secondary | ICD-10-CM

## 2021-03-19 DIAGNOSIS — I25119 Atherosclerotic heart disease of native coronary artery with unspecified angina pectoris: Secondary | ICD-10-CM | POA: Diagnosis not present

## 2021-03-19 DIAGNOSIS — I739 Peripheral vascular disease, unspecified: Secondary | ICD-10-CM

## 2021-03-19 DIAGNOSIS — E1169 Type 2 diabetes mellitus with other specified complication: Secondary | ICD-10-CM | POA: Diagnosis not present

## 2021-03-19 DIAGNOSIS — I10 Essential (primary) hypertension: Secondary | ICD-10-CM

## 2021-03-19 DIAGNOSIS — E785 Hyperlipidemia, unspecified: Secondary | ICD-10-CM | POA: Diagnosis not present

## 2021-03-19 NOTE — Progress Notes (Signed)
? ?Primary Care Provider: Caryl Bis, MD ?Cardiologist: Glenetta Hew, MD ?Electrophysiologist: None ? ?Clinic Note: ?Chief Complaint  ?Patient presents with  ? Follow-up  ?  Annual follow-up  ? Loss of Consciousness  ?  Likely vasovagal-admitted in February with syncope related to urinary obstruction.  ? Coronary Artery Disease  ?  No angina  ? ? ?=================================== ? ?ASSESSMENT/PLAN  ? ?Problem List Items Addressed This Visit   ? ?  ? Cardiology Problems  ? CAD -> CABG x3 then Re-DO CABG x1 (LRad-dLAD) after atretic LIMA & LAD stent occlusion. (Chronic)  ?  Severe multivessel disease.  No active angina.  Has been stable. ? ?In the absence of symptoms, no plans for follow-up stress test ischemic evaluation.  He had a cath done 4 years ago with relatively normal findings. ? ?Plan: Continue aspirin and Plavix for combination of CAD and PAD. ?No longer on beta-blocker (or ARB for that matter) because orthostatic hypotension ?The only antianginal medication he is on his Imdur at 60 mg. ?On high-dose rosuvastatin along with Trulicity and Jardiance. ?  ?  ? Hyperlipidemia associated with type 2 diabetes mellitus (HCC) (Chronic)  ?  He remains on rosuvastatin 40 mg daily.  Labs ostensibly followed by PCP, but not available for review. ? ?He is also on Jardiance and Trulicity combination cardiovascular and diabetes benefit. ? ?Without labs, unable to truly manage.  He had already eaten today, so I did not order labs.  He says that labs are being followed by PCP.  Will defer management of lipids and glycemic control to PCP.  With his advanced-stage, I would not be overly aggressive. ? ?I am also concerned about issues with hypoglycemia given his poor nutrition and 10 pound weight loss. ? ?  ?  ? Essential hypertension, benign (Chronic)  ?  His blood pressure looks good today.  He was on metoprolol prior to his hospitalization, but this was discontinued.  Now not on anything for blood pressure.  In  fact he is on fludrocortisone to help against orthostatic hypotension. ? ?  ?  ? Peripheral arterial disease - left vertebral and subclavian artery stenosis status post PTA/stent (Chronic)  ?  Status post left vertebral stent by Dr. Gwenlyn Found 2005.  Previously done in 2002. ? ?We are on exam.  No signs of subclavian steal, or any significant TIA or amaurosis fugax. ? ?  ?  ? Orthostatic hypotension  ?  Was orthostatic in the ER and hospital.  Was placed on Florinef. ?He is on a combination of Florinef as well as doxazosin and finasteride. ? ?Clearly has urinary outflow obstruction which is potentially be because of urine outflow struct obstruction and UTI leading to his syncope.  Unfortunately these medications also are associated with hypotension. ? ?Hydration is important, especially on Jardiance.  However he is not eating well and I suspect part of this is due to malnutrition and poor absorption. ?I stressed the importance of eating as well as drinking.  Staying adequately hydrated with roughly 80 ounces of water a day would only help if he also eats along with it. ? ?  ?  ?  ? Other  ? S/P CABG (coronary artery bypass graft): Initial CABGX3 1995 (LIMA-LAD, SVG-OM 2, SVG-RPDA) --> redo CABG x1 FreeLRad-LAD for a occluded LAD with atretic LIMA and failed attempted revascularization. (Chronic)  ?  Last cath was in 2019.  We have talked about screening ischemic evaluation.  In the absence of true anginal symptoms,  he would prefer to avoid further testing. ? ?  ?  ? Weight loss  ?  10 more pounds of weight loss in the last several months.  He had actually gotten his weight up and has now lost again. ? ?Concern for malnutrition.  I am asked concerned about his access to food.  We talked about supplementing his diet with protein drinks, indicating that he can eat what ever he wants to eat in order to help maintain his body weight and overall nutrition/muscle mass. ? ?Would defer to PCP for evaluation of etiology of weight  loss.  But my concern is he is beginning to show signs of failure to thrive ? ? ?  ?  ? Vasovagal syncope - Primary (Chronic)  ?  Thought to be related to UTI, urinary outflow tract obstruction and post micturition/BM related syncope.  No further episodes ? ?Stressed the importance of adequate hydration. ?Was started on Florinef which she is continuing.  Thankfully, no signs of volume overload. ? ?Although Jardiance does provide help, it also does potentially lead to dehydration.  Closely monitor hydration status.  Also concerns for malnutrition and poor absorption. ?Stressed importance of adequate hydration but also eating.  ? ? ?  ?  ? ? ?=================================== ? ?HPI:   ? ?[name] is a 88 y.o. male with a PMH notable for CAD-CABG](1995 with redo in 2001 - L Rad-dLAD after occlusion of LAD stent & atretic LIMA is not), chronic stable angina and MSK pain since surgery, HTN, HLD and DM-2  who presents today for hospital f/u for syncope.  ? ?Last Cath 09/2017:  1. Severe LM & 3V Obstructive CAD (100% LAD, 90& RCA & LCx); 2. Patent frLRad-dLAD; Patent SVG-OM1; 4. Patent SVG-d RCA; 5. Normal LV function; 6. Normal LVEDP ? ?[name] was last seen in Nov 2022: Doing well, stable from cardiac standpoint.  Lots of noncardiac complaints is chronic musculoskeletal chest pain since his surgery.  Worsening neuropathy in his feet and worsening vision.  Blood sugars are out of control and labile.  Stable exertional dyspnea and occasional palpitations.  Notable weight loss, but had stabilized. ? 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  Went to the bathroom to have a bowel movement, stood up and collapsed to the ground.  He claimed that he did not have LOC, but family said he did.  Family reports that he was on the ground for several minutes.  Glucose was elevated, but not that high.  He  had felt some lightheadedness and dizziness but no signs of vertigo. ->  Was on antibiotics for recent UTI, and was on eyedrops for his recent cataract surgery.  Indicated that he stays hydrated => he became tachycardic and tachypneic walking a few steps after gentle hydration and electrolyte replenishment.  Had hypomagnesemia and hypokalemia ?Echo ordered.  Normal. ?Symptoms felt to be vasovagal due to urine obstruction from BPH and UTI. ?Amlodipine discontinued on discharge along with 8 mg doxazosin. ?Was started on Florinef 100 mg daily ? ?Reviewed  CV studies:   ? ?The following studies were reviewed today: (if available, images/films reviewed: From Epic Chart or Care Everywhere) ?Echo 02/2021: EF 70 to 75%.  Hyperdynamic.  No RWMA.  GR 1 DD.  Normal RV, RVP and low normal RAP.  Normal valves. ? ?Interval History:  ? ?[name] today for follow-up returns to  of his hospitalization.  He denies having any further syncopal episodes, but still seems to be somewhat orthostatic.  His weight is down additional 10 pounds since his hospitalization.  He says that he is eating and drinking okay, but this seems to be unlikely. ? ?For the most part, he is pretty stable he notices of GERD symptoms.  Has not had any syncope but does get dizzy.  Still has musculoskeletal chest pain but this is chronic as is his stable exertional dyspnea.  He is just not doing as much as he used to. ?He denies any arrhythmia symptoms.  No TIA or amaurosis fugax.  No PND, orthopnea.  No edema on fludrocortisone. ? ?REVIEWED OF SYSTEMS  ? ?Review of Systems  ?Constitutional:  Positive for malaise/fatigue (Just feels tired and lethargic.  Not as active) and weight loss.  ?HENT:  Negative for congestion.   ?Respiratory:  Negative for cough and shortness of breath.   ?Cardiovascular:  Positive for chest pain (Musculoskeletal).  ?Gastrointestinal:  Positive for heartburn. Negative for abdominal pain, blood in stool, constipation and melena.  ?      Decreased appetite  ?Genitourinary:  Negative for hematuria.  ?Musculoskeletal:  Positive for back pain and joint pain. Negative for falls.  ?Neurological:  Positive for dizziness and headaches. Negat

## 2021-03-19 NOTE — Patient Instructions (Signed)
Medication Instructions:  ?Your physician recommends that you continue on your current medications as directed. Please refer to the Current Medication list given to you today. ? ?*If you need a refill on your cardiac medications before your next appointment, please call your pharmacy* ? ?Follow-Up: ?At [facility], you and your health needs are our priority.  As part of our continuing mission to provide you with exceptional heart care, we have created designated Provider Care Teams.  These Care Teams include your primary Cardiologist (physician) and Advanced Practice Providers (APPs -  Physician Assistants and Nurse Practitioners) who all work together to provide you with the care you need, when you need it. ? ?We recommend signing up for the patient portal called "MyChart".  Sign up information is provided on this After Visit Summary.  MyChart is used to connect with patients for Virtual Visits (Telemedicine).  Patients are able to view lab/test results, encounter notes, upcoming appointments, etc.  Non-urgent messages can be sent to your provider as well.   ?To learn more about what you can do with MyChart, go to [email or web address].   ? ?Your next appointment:   ?November ? ?The format for your next appointment:   ?In Person ? ?Provider:   ?Glenetta Hew, MD   ? ? ? ? ?

## 2021-03-23 DIAGNOSIS — I10 Essential (primary) hypertension: Secondary | ICD-10-CM | POA: Diagnosis not present

## 2021-03-23 DIAGNOSIS — E876 Hypokalemia: Secondary | ICD-10-CM | POA: Diagnosis not present

## 2021-03-23 DIAGNOSIS — Z1329 Encounter for screening for other suspected endocrine disorder: Secondary | ICD-10-CM | POA: Diagnosis not present

## 2021-03-23 DIAGNOSIS — E559 Vitamin D deficiency, unspecified: Secondary | ICD-10-CM | POA: Diagnosis not present

## 2021-03-23 DIAGNOSIS — R5383 Other fatigue: Secondary | ICD-10-CM | POA: Diagnosis not present

## 2021-03-23 DIAGNOSIS — E1165 Type 2 diabetes mellitus with hyperglycemia: Secondary | ICD-10-CM | POA: Diagnosis not present

## 2021-03-23 DIAGNOSIS — Z1321 Encounter for screening for nutritional disorder: Secondary | ICD-10-CM | POA: Diagnosis not present

## 2021-03-26 ENCOUNTER — Other Ambulatory Visit: Payer: Self-pay | Admitting: Cardiology

## 2021-03-26 NOTE — Telephone Encounter (Signed)
RN called patient . Patient states he is still taking Rosuvastatin 40 mg daily. Patient states he has a half a bottle left.  ?   ? RN informed patient pharmacy is requesting refill.  Patient states the office can send the refill into the pharmacy ?

## 2021-03-30 DIAGNOSIS — R1011 Right upper quadrant pain: Secondary | ICD-10-CM | POA: Diagnosis not present

## 2021-03-30 DIAGNOSIS — R634 Abnormal weight loss: Secondary | ICD-10-CM | POA: Diagnosis not present

## 2021-03-30 DIAGNOSIS — R109 Unspecified abdominal pain: Secondary | ICD-10-CM | POA: Diagnosis not present

## 2021-03-30 DIAGNOSIS — I700 Atherosclerosis of aorta: Secondary | ICD-10-CM | POA: Diagnosis not present

## 2021-04-02 DIAGNOSIS — E1165 Type 2 diabetes mellitus with hyperglycemia: Secondary | ICD-10-CM | POA: Diagnosis not present

## 2021-04-02 DIAGNOSIS — I10 Essential (primary) hypertension: Secondary | ICD-10-CM | POA: Diagnosis not present

## 2021-04-14 DIAGNOSIS — E1165 Type 2 diabetes mellitus with hyperglycemia: Secondary | ICD-10-CM | POA: Diagnosis not present

## 2021-04-14 DIAGNOSIS — R634 Abnormal weight loss: Secondary | ICD-10-CM | POA: Diagnosis not present

## 2021-04-14 DIAGNOSIS — Z87898 Personal history of other specified conditions: Secondary | ICD-10-CM | POA: Diagnosis not present

## 2021-04-14 DIAGNOSIS — Z6820 Body mass index (BMI) 20.0-20.9, adult: Secondary | ICD-10-CM | POA: Diagnosis not present

## 2021-04-14 DIAGNOSIS — I10 Essential (primary) hypertension: Secondary | ICD-10-CM | POA: Diagnosis not present

## 2021-04-14 DIAGNOSIS — R109 Unspecified abdominal pain: Secondary | ICD-10-CM | POA: Diagnosis not present

## 2021-04-21 ENCOUNTER — Encounter: Payer: Self-pay | Admitting: Internal Medicine

## 2021-05-02 DIAGNOSIS — E1165 Type 2 diabetes mellitus with hyperglycemia: Secondary | ICD-10-CM | POA: Diagnosis not present

## 2021-05-02 DIAGNOSIS — Z794 Long term (current) use of insulin: Secondary | ICD-10-CM | POA: Diagnosis not present

## 2021-05-02 DIAGNOSIS — I482 Chronic atrial fibrillation, unspecified: Secondary | ICD-10-CM | POA: Diagnosis not present

## 2021-05-02 DIAGNOSIS — I10 Essential (primary) hypertension: Secondary | ICD-10-CM | POA: Diagnosis not present

## 2021-05-03 ENCOUNTER — Encounter: Payer: Self-pay | Admitting: Cardiology

## 2021-05-03 DIAGNOSIS — I951 Orthostatic hypotension: Secondary | ICD-10-CM | POA: Insufficient documentation

## 2021-05-03 NOTE — Assessment & Plan Note (Addendum)
10 more pounds of weight loss in the last several months.  He had actually gotten his weight up and has now lost again. ? ?Concern for malnutrition.  I am asked concerned about his access to food.  We talked about supplementing his diet with protein drinks, indicating that he can eat what ever he wants to eat in order to help maintain his body weight and overall nutrition/muscle mass. ? ?Would defer to PCP for evaluation of etiology of weight loss.  But my concern is he is beginning to show signs of failure to thrive ? ?

## 2021-05-03 NOTE — Assessment & Plan Note (Signed)
Thought to be related to UTI, urinary outflow tract obstruction and post micturition/BM related syncope.  No further episodes ? ?Stressed the importance of adequate hydration. ?Was started on Florinef which she is continuing.  Thankfully, no signs of volume overload. ? ?Although Jardiance does provide help, it also does potentially lead to dehydration.  Closely monitor hydration status.  Also concerns for malnutrition and poor absorption. ?Stressed importance of adequate hydration but also eating.  ? ?

## 2021-05-03 NOTE — Assessment & Plan Note (Signed)
Severe multivessel disease.  No active angina.  Has been stable. ? ?In the absence of symptoms, no plans for follow-up stress test ischemic evaluation.  He had a cath done 4 years ago with relatively normal findings. ? ?Plan: Continue aspirin and Plavix for combination of CAD and PAD. ?? No longer on beta-blocker (or ARB for that matter) because orthostatic hypotension ?? The only antianginal medication he is on his Imdur at 60 mg. ?? On high-dose rosuvastatin along with Trulicity and Jardiance. ?

## 2021-05-03 NOTE — Assessment & Plan Note (Addendum)
Status post left vertebral stent by Dr. Gwenlyn Found 2005.  Previously done in 2002. ? ?We are on exam.  No signs of subclavian steal, or any significant TIA or amaurosis fugax. ?

## 2021-05-03 NOTE — Assessment & Plan Note (Signed)
Was orthostatic in the ER and hospital.  Was placed on Florinef. ?He is on a combination of Florinef as well as doxazosin and finasteride. ? ?Clearly has urinary outflow obstruction which is potentially be because of urine outflow struct obstruction and UTI leading to his syncope.  Unfortunately these medications also are associated with hypotension. ? ?Hydration is important, especially on Jardiance.  However he is not eating well and I suspect part of this is due to malnutrition and poor absorption. ?I stressed the importance of eating as well as drinking.  Staying adequately hydrated with roughly 80 ounces of water a day would only help if he also eats along with it. ?

## 2021-05-03 NOTE — Assessment & Plan Note (Signed)
He remains on rosuvastatin 40 mg daily.  Labs ostensibly followed by PCP, but not available for review. ? ?He is also on Jardiance and Trulicity combination cardiovascular and diabetes benefit. ? ?Without labs, unable to truly manage.  He had already eaten today, so I did not order labs.  He says that labs are being followed by PCP.  Will defer management of lipids and glycemic control to PCP.  With his advanced-stage, I would not be overly aggressive. ? ?I am also concerned about issues with hypoglycemia given his poor nutrition and 10 pound weight loss. ?

## 2021-05-03 NOTE — Assessment & Plan Note (Signed)
His blood pressure looks good today.  He was on metoprolol prior to his hospitalization, but this was discontinued.  Now not on anything for blood pressure.  In fact he is on fludrocortisone to help against orthostatic hypotension. ?

## 2021-05-03 NOTE — Assessment & Plan Note (Signed)
Last cath was in 2019.  We have talked about screening ischemic evaluation.  In the absence of true anginal symptoms, he would prefer to avoid further testing. ?

## 2021-05-10 ENCOUNTER — Ambulatory Visit: Payer: Medicare Other | Admitting: Cardiology

## 2021-05-20 ENCOUNTER — Encounter: Payer: Self-pay | Admitting: Internal Medicine

## 2021-05-20 ENCOUNTER — Ambulatory Visit: Payer: Medicare Other | Admitting: Internal Medicine

## 2021-05-20 VITALS — BP 138/60 | HR 62 | Temp 98.3°F | Ht 66.0 in | Wt 129.0 lb

## 2021-05-20 DIAGNOSIS — K219 Gastro-esophageal reflux disease without esophagitis: Secondary | ICD-10-CM | POA: Diagnosis not present

## 2021-05-20 DIAGNOSIS — R1319 Other dysphagia: Secondary | ICD-10-CM | POA: Diagnosis not present

## 2021-05-20 DIAGNOSIS — R634 Abnormal weight loss: Secondary | ICD-10-CM | POA: Diagnosis not present

## 2021-05-20 NOTE — Progress Notes (Signed)
Primary Care Physician:  Caryl Bis, MD Primary Gastroenterologist:  Dr. Abbey Chatters  Chief Complaint  Patient presents with   Weight Loss    Unexplained weight loss.     HPI:   [name] is a 88 y.o. male who presents to clinic today by referral from his PCP Dr. Quillian Quince for evaluation.  Notes multiple GI complaints for me today.  Notes chronic GERD.  Previously on PPI therapy though this has been stopped for some time now.  GERD symptoms intermittent.  Mild to moderate in severity.  Also with difficulty swallowing.  Notes progressively worsening dysphagia.  Primarily with solids.  Feels as though food gets stuck in his substernal region.  This is led to weight loss as well.  No melena hematochezia.  No abdominal pain.  CT abdomen pelvis without contrast on 03/30/2021 showed colonic diverticulosis, otherwise unremarkable from GI standpoint.    Past Medical History:  Diagnosis Date   Asymptomatic stenosis of left vertebral artery 2002, 2005   Status post PTA/stent with redo; normal antegrade flow on dopplers 09/2013   CAD (coronary artery disease) 1995   1CABG x3; redo in CABG x 1 2001 Free Radial to LAD; All grafts patent by Cath 08/2014: The proximal to mid LAD has poor retrograde filling from the LIMA due to in-stent restenosis. 50% ostial disease of free radial artery to the LAD.   CAD (coronary artery disease) of artery bypass graft 2000   PCI x 2 - ostial & prox-mid LAD (BMS) for atretic LIMA-LAD   CAD in native artery 1995   CABG x 3 - LIMA-LAD, SVG-OM2, SVG-rPDA   Chest pain 09/2017   DM (diabetes mellitus) type II controlled peripheral vascular disorder 2002   Left subclavian and left vertebral artery stenoses   Glaucoma    Hyperlipidemia LDL goal <70    Hypertension, essential    S/P CABG x 3 1995    S/P Redo CABG x 1 2001   L Radial-LAD after 2 failed attempts @ LIMA-LAD PTCA; LAD stents 100% occluded   Stenosis of left subclavian artery ([place]) 11/2003   Status  post PTA/stent --> < 50 % stenosis by Dopplers 09/2013    Past Surgical History:  Procedure Laterality Date   CARDIAC CATHETERIZATION N/A 08/04/2014   Procedure: Left Heart Cath and Cors/Grafts Angiography;  Surgeon: Leonie Man, MD;  Location: [place] [facility];  Service: Cardiovascular: o-mRCA 70%, m-dRCA ~70% - SVG-dRCA Patent.  o-pLAD 100% stent occluded, mLAD 95% ISR with poor retrograde filling from freeRadiial graft-LAD (50% ostial graft dz).  o-p Cx 80%. Widely patent SVG-Cx-OM fills retrograde to 80% lesion.; Normal LV Fxn & EDP   CORONARY ANGIOPLASTY  April and May 2001   After Both LAD stents occluded - PTCA of anastomatic LIMA-LAD lesion -- Unsuccessful.   CORONARY ARTERY BYPASS GRAFT  1995   LIMA-LAD, SVG-OM2, SVG-rPDA   CORONARY ARTERY BYPASS GRAFT  06/1999   Dr. Lucianne Lei Trigt: Redo LAD grafting with free Left Radial-distal LAD   CORONARY STENT PLACEMENT  1995-2000   2 BMS stents to osital-proximal & proximal-mid LAD; because of atretic LIMA-LDA   LEFT HEART CATH AND CORONARY ANGIOGRAPHY N/A 09/18/2017   Procedure: LEFT HEART CATH AND CORONARY ANGIOGRAPHY;  Surgeon: Martinique, Peter M, MD;  Location: [facility] [facility];  Service: Cardiovascular;  Laterality: N/A;   LEFT HEART CATHETERIZATION WITH CORONARY/GRAFT ANGIOGRAM N/A 08/22/2011   Procedure: LEFT HEART CATHETERIZATION WITH Beatrix Fetters;  Surgeon: Leonie Man,  MD;  Location: [place] [facility];  Service: Cardiovascular:  Known occluded LIMA-LAD and ostial LAD. Moderate to severe proximal circumflex and RCA disease. Widely patent freeLRAD-dLAD, as well as SVG-RPDA (backfilling RPL), SVG-OM 2 (backfilling OM1)   SHOULDER OPEN ROTATOR CUFF REPAIR  10/2002   Dr. Noemi Chapel   [facility] VERT OR THOR CAROTID STENT Left October 2002; November 2005   Left Vertebral stent placed in October 2002 (Dr. Patrecia Pour); redo PCI in 2005   [place] Left 11/2003   Dr. Gwenlyn Found   TRANSTHORACIC ECHOCARDIOGRAM  04/10/2020    [facility]): Normal LV size and function.  EF 60 to 65%.  GR 1 DD.  Mild AI.  Mild to moderate LA dilation.  GR 1 DD.[name]   TRANSTHORACIC ECHOCARDIOGRAM  02/2021   EF 70 to 75%.  Hyperdynamic.  No RWMA.  GR 1 DD.  Normal RV, RVP and low normal RAP.  Normal valves.    Current Outpatient Medications  Medication Sig Dispense Refill   acetaminophen (TYLENOL) 500 MG tablet Take 1,000 mg by mouth every 6 (six) hours as needed for mild pain or headache.     albuterol (VENTOLIN HFA) 108 (90 Base) MCG/ACT inhaler INHALE TWO PUFFS BY MOUTH FOUR TIMES DAILY     aspirin EC 81 MG tablet Take 81 mg by mouth daily with breakfast.      Brinzolamide-Brimonidine 1-0.2 % SUSP Place 1 drop into the right eye 3 (three) times daily.     Cholecalciferol (VITAMIN D) 2000 units tablet Take 2,000 Units by mouth daily with breakfast.     clopidogrel (PLAVIX) 75 MG tablet Take 75 mg by mouth daily.     doxazosin (CARDURA) 8 MG tablet Take 1 tablet (8 mg total) by mouth at bedtime. 30 tablet 1   finasteride (PROSCAR) 5 MG tablet Take 5 mg by mouth daily.     fludrocortisone (FLORINEF) 0.1 MG tablet Take 100 mcg by mouth daily.     gabapentin (NEURONTIN) 300 MG capsule Take 600 mg by mouth See admin instructions. Take two capsules (600 mg) by mouth three times daily - breakfast, supper and bedtime     isosorbide mononitrate (IMDUR) 60 MG 24 hr tablet May take 1 and 1/2 tablets to 2 tablets a day depending on if chest discomfort is present (Patient taking differently: 30 mg daily.) 60 tablet 6   nitroGLYCERIN (NITROSTAT) 0.4 MG SL tablet Place 1 tablet (0.4 mg total) under the tongue every 5 (five) minutes as needed for up to 25 days for chest pain. And increase blood pressure greater than 585 systolic 25 tablet 6   Omega-3 Fatty Acids (FISH OIL) 1000 MG CAPS Take 1,000 mg by mouth 2 (two) times daily with a meal.      omeprazole (PRILOSEC) 20 MG capsule Take 20 mg by mouth daily.     prednisoLONE acetate (PRED FORTE) 1 %  ophthalmic suspension Place 1 drop into both eyes 2 (two) times daily.     rosuvastatin (CRESTOR) 40 MG tablet TAKE 1 TABLET BY MOUTH DAILY 90 tablet 3   TRESIBA FLEXTOUCH 100 UNIT/ML FlexTouch Pen Inject into the skin.     vitamin C (ASCORBIC ACID) 500 MG tablet Take 500 mg by mouth 2 (two) times daily with a meal.      amLODipine (NORVASC) 2.5 MG tablet Take 2.5 mg by mouth as needed. (Patient not taking: Reported on 05/20/2021)     hydrocortisone cream 1 % Apply 1 application topically daily as needed for itching. (  Patient not taking: Reported on 05/20/2021)     JARDIANCE 25 MG TABS tablet Take 25 mg by mouth daily. (Patient not taking: Reported on 05/20/2021)     timolol (TIMOPTIC) 0.5 % ophthalmic solution Place 1 drop into both eyes daily. (Patient not taking: Reported on 9/38/1829)     TRULICITY 9.37 JI/9.6VE SOPN Inject 20 mg into the skin daily. (Patient not taking: Reported on 05/20/2021)     No current facility-administered medications for this visit.    Allergies as of 05/20/2021 - Review Complete 05/20/2021  Allergen Reaction Noted   Iohexol Other (See Comments) 11/03/2002   Contrast media [iodinated contrast media]  11/09/2012   Metformin and related Diarrhea 11/11/2012    Family History  Problem Relation Age of Onset   Diabetes Mother    Diabetes Sister    Diabetes Brother     Social History   Socioeconomic History   Marital status: Married    Spouse name: Not on file   Number of children: Not on file   Years of education: Not on file   Highest education level: Not on file  Occupational History   Not on file  Tobacco Use   Smoking status: Former    Packs/day: 3.00    Years: 30.00    Pack years: 90.00    Types: Cigarettes   Smokeless tobacco: Never   Tobacco comments:    quit smoking about 50 years ago  Vaping Use   Vaping Use: Never used  Substance and Sexual Activity   Alcohol use: No   Drug use: No   Sexual activity: Not Currently  Other Topics Concern    Not on file  Social History Narrative   He is married with one daughter. He has 2 grandchildren.   He does not smoke. He quit smoking in roughly 1970 after smoking 3 packs per day.   He is routinely at give him. It does not necessarily do routine exercise.    Social Determinants of Health   Financial Resource Strain: Not on file  Food Insecurity: Not on file  Transportation Needs: Not on file  Physical Activity: Not on file  Stress: Not on file  Social Connections: Not on file  Intimate Partner Violence: Not on file    Subjective: Review of Systems  Constitutional:  Positive for weight loss. Negative for chills and fever.  HENT:  Negative for congestion and hearing loss.   Eyes:  Negative for blurred vision and double vision.  Respiratory:  Negative for cough and shortness of breath.   Cardiovascular:  Negative for chest pain and palpitations.  Gastrointestinal:  Positive for heartburn. Negative for abdominal pain, blood in stool, constipation, diarrhea, melena and vomiting.  Genitourinary:  Negative for dysuria and urgency.  Musculoskeletal:  Negative for joint pain and myalgias.  Skin:  Negative for itching and rash.  Neurological:  Negative for dizziness and headaches.  Psychiatric/Behavioral:  Negative for depression. The patient is not nervous/anxious.       Objective: BP 138/60 (BP Location: Left Arm, Patient Position: Sitting, Cuff Size: Normal)   Pulse 62   Temp 98.3 F (36.8 C) (Oral)   Ht 5\' 6"  (1.676 m)   Wt 129 lb (58.5 kg)   SpO2 95%   BMI 20.82 kg/m  Physical Exam Constitutional:      Appearance: Normal appearance.  HENT:     Head: Normocephalic and atraumatic.  Eyes:     Extraocular Movements: Extraocular movements intact.     Conjunctiva/sclera:  Conjunctivae normal.  Cardiovascular:     Rate and Rhythm: Normal rate and regular rhythm.  Pulmonary:     Effort: Pulmonary effort is normal.     Breath sounds: Normal breath sounds.  Abdominal:      General: Bowel sounds are normal.     Palpations: Abdomen is soft.  Musculoskeletal:        General: Normal range of motion.     Cervical back: Normal range of motion and neck supple.  Skin:    General: Skin is warm.  Neurological:     General: No focal deficit present.     Mental Status: He is alert and oriented to person, place, and time.  Psychiatric:        Mood and Affect: Mood normal.        Behavior: Behavior normal.     Assessment: *GERD *Weight loss *Esophageal dysphagia  Plan: Will schedule for EGD with possible dilation to evaluate for peptic ulcer disease, esophagitis, gastritis, H. Pylori, duodenitis, or other. Will also evaluate for esophageal stricture, Schatzki's ring, esophageal web or other.   The risks including infection, bleed, or perforation as well as benefits, limitations, alternatives and imponderables have been reviewed with the patient. Potential for esophageal dilation, biopsy, etc. have also been reviewed.  Questions have been answered. All parties agreeable.  We will reach out to patient's cardiologist for cardiac clearance prior to scheduling.  Appreciate their help immensely with this patient.  May need to restart patient on PPI therapy pending endoscopic evaluation.  Thank you Dr. Quillian Quince for the kind referral.  05/20/2021 2:49 PM   Disclaimer: This note was dictated with voice recognition software. Similar sounding words can inadvertently be transcribed and may not be corrected upon review.

## 2021-05-20 NOTE — Patient Instructions (Signed)
We will schedule you for upper endoscopy to further evaluate your weight loss, acid reflux, choking on food.  We will reach out to your cardiologist for their blessing prior to scheduling this.  Once we have heard back from them we will call you to set this up.  It was very nice meeting you today.  Dr. Abbey Chatters  At [facility] we value your feedback. You may receive a survey about your visit today. Please share your experience as we strive to create trusting relationships with our patients to provide genuine, compassionate, quality care.  We appreciate your understanding and patience as we review any laboratory studies, imaging, and other diagnostic tests that are ordered as we care for you. Our office policy is 5 business days for review of these results, and any emergent or urgent results are addressed in a timely manner for your best interest. If you do not hear from our office in 1 week, please contact us.   We also encourage the use of MyChart, which contains your medical information for your review as well. If you are not enrolled in this feature, an access code is on this after visit summary for your convenience. Thank you for allowing [place] to be involved in your care.  It was great to see you today!  I hope you have a great rest of your Spring!    [name]. Abbey Chatters, D.O. Gastroenterology and Hepatology [facility]

## 2021-05-24 ENCOUNTER — Telehealth: Payer: Self-pay | Admitting: *Deleted

## 2021-05-24 NOTE — Telephone Encounter (Signed)
Tele pre op appt 05/26/21 @ 2 pm. Med rec and consent are done.     Patient Consent for Virtual Visit        [name] has provided verbal consent on 05/24/2021 for a virtual visit (video or telephone).   CONSENT FOR VIRTUAL VISIT FOR:  [name]  By participating in this virtual visit I agree to the following:  I hereby voluntarily request, consent and authorize [facility] and its employed or contracted physicians, physician assistants, nurse practitioners or other licensed health care professionals (the Practitioner), to provide me with telemedicine health care services (the "Services") as deemed necessary by the treating Practitioner. I acknowledge and consent to receive the Services by the Practitioner via telemedicine. I understand that the telemedicine visit will involve communicating with the Practitioner through live audiovisual communication technology and the disclosure of certain medical information by electronic transmission. I acknowledge that I have been given the opportunity to request an in-person assessment or other available alternative prior to the telemedicine visit and am voluntarily participating in the telemedicine visit.  I understand that I have the right to withhold or withdraw my consent to the use of telemedicine in the course of my care at any time, without affecting my right to future care or treatment, and that the Practitioner or I may terminate the telemedicine visit at any time. I understand that I have the right to inspect all information obtained and/or recorded in the course of the telemedicine visit and may receive copies of available information for a reasonable fee.  I understand that some of the potential risks of receiving the Services via telemedicine include:  Delay or interruption in medical evaluation due to technological equipment failure or disruption; Information transmitted may not be sufficient (e.g. poor resolution of images) to  allow for appropriate medical decision making by the Practitioner; and/or  In rare instances, security protocols could fail, causing a breach of personal health information.  Furthermore, I acknowledge that it is my responsibility to provide information about my medical history, conditions and care that is complete and accurate to the best of my ability. I acknowledge that Practitioner's advice, recommendations, and/or decision may be based on factors not within their control, such as incomplete or inaccurate data provided by me or distortions of diagnostic images or specimens that may result from electronic transmissions. I understand that the practice of medicine is not an exact science and that Practitioner makes no warranties or guarantees regarding treatment outcomes. I acknowledge that a copy of this consent can be made available to me via my patient portal ([facility]), or I can request a printed copy by calling the office of [facility].    I understand that my insurance will be billed for this visit.   I have read or had this consent read to me. I understand the contents of this consent, which adequately explains the benefits and risks of the Services being provided via telemedicine.  I have been provided ample opportunity to ask questions regarding this consent and the Services and have had my questions answered to my satisfaction. I give my informed consent for the services to be provided through the use of telemedicine in my medical care

## 2021-05-24 NOTE — Telephone Encounter (Signed)
   Pre-operative Risk Assessment    Patient Name: [name]  DOB: [birth year] MRN: [id number]      Request for Surgical Clearance    Procedure:   EGD/DILATION  Date of Surgery:  Clearance TBD                                 Surgeon:  DR. Abbey Chatters Surgeon's Group or Practice Name: [facility] Phone number:  [phone] Fax number:  [phone]   Type of Clearance Requested:   - Medical ; PER REQUEST NO MEDICATIONS NEED TO BE HELD   Type of Anesthesia:   PROPOFOL   Additional requests/questions:    Jiles Prows   05/24/2021, 3:27 PM     Letter  Floria Raveling I, CMA on 05/24/2021     [facility] [address] [place], [place]  [address] Phone:  [phone]   Fax:  [phone]      May 24, 2021     RE: [name] [name] [id number]   The above patient is needing Cardiac Clearance to have a EGD/ Dilation ASA lll   The procedure date has not been set.   There is no medication to be withheld   Dr. Hurshel Keys will be doing the procedure   Anesthesia being used is propofol   Dx: Dysphagia, weight loss and Gerd.       Thank you, Oleh Genin, CMA

## 2021-05-24 NOTE — Telephone Encounter (Signed)
Primary Cardiologist:David Ellyn Hack, MD  Chart reviewed as part of pre-operative protocol coverage. Because of Shyloh Krinke Care's past medical history and time since last visit, he/she will require a virtual visit/telephone call in order to better assess preoperative cardiovascular risk.  Pre-op covering staff: - Please contact patient, obtain consent, and schedule appointment   If applicable, this message will also be routed to pharmacy pool and/or primary cardiologist for input on holding anticoagulant/antiplatelet agent as requested below so that this information is available at time of patient's appointment.   Emmaline Life, NP-C    05/24/2021, 4:15 PM [place] [address] Office [phone] Fax [phone]

## 2021-05-24 NOTE — Telephone Encounter (Signed)
Tele pre op appt 05/26/21 @ 2 pm. Med rec and consent are done.

## 2021-05-25 DIAGNOSIS — I10 Essential (primary) hypertension: Secondary | ICD-10-CM | POA: Diagnosis not present

## 2021-05-25 DIAGNOSIS — E1165 Type 2 diabetes mellitus with hyperglycemia: Secondary | ICD-10-CM | POA: Diagnosis not present

## 2021-05-25 DIAGNOSIS — R109 Unspecified abdominal pain: Secondary | ICD-10-CM | POA: Diagnosis not present

## 2021-05-25 DIAGNOSIS — Z87898 Personal history of other specified conditions: Secondary | ICD-10-CM | POA: Diagnosis not present

## 2021-05-25 DIAGNOSIS — Z6821 Body mass index (BMI) 21.0-21.9, adult: Secondary | ICD-10-CM | POA: Diagnosis not present

## 2021-05-25 DIAGNOSIS — R634 Abnormal weight loss: Secondary | ICD-10-CM | POA: Diagnosis not present

## 2021-05-26 ENCOUNTER — Ambulatory Visit (INDEPENDENT_AMBULATORY_CARE_PROVIDER_SITE_OTHER): Payer: Medicare Other | Admitting: General Practice

## 2021-05-26 DIAGNOSIS — Z01810 Encounter for preprocedural cardiovascular examination: Secondary | ICD-10-CM | POA: Diagnosis not present

## 2021-05-26 NOTE — Progress Notes (Signed)
Virtual Visit via Telephone Note   Because of [name]'s co-morbid illnesses, he is at least at moderate risk for complications without adequate follow up.  This format is felt to be most appropriate for this patient at this time.  The patient did not have access to video technology/had technical difficulties with video requiring transitioning to audio format only (telephone).  All issues noted in this document were discussed and addressed.  No physical exam could be performed with this format.  Please refer to the patient's chart for his consent to telehealth for [facility].  Evaluation Performed:  Preoperative cardiovascular risk assessment _____________   Date:  05/26/2021   Patient ID:  [name], DOB Jul 10, 1932, MRN [id number] Patient Location:  Home Provider location:   Office  Primary Care Provider:  Caryl Bis, MD Primary Cardiologist:  Glenetta Hew, MD  Chief Complaint / Patient Profile   88 y.o. y/o male with a h/o CAD, PAD, HLD, HTN, orthostatic hypotension, and vasovagal syncope who is pending EGD with dilation and presents today for telephonic preoperative cardiovascular risk assessment.  Past Medical History    Past Medical History:  Diagnosis Date   Asymptomatic stenosis of left vertebral artery 2002, 2005   Status post PTA/stent with redo; normal antegrade flow on dopplers 09/2013   CAD (coronary artery disease) 1995   1CABG x3; redo in CABG x 1 2001 Free Radial to LAD; All grafts patent by Cath 08/2014: The proximal to mid LAD has poor retrograde filling from the LIMA due to in-stent restenosis. 50% ostial disease of free radial artery to the LAD.   CAD (coronary artery disease) of artery bypass graft 2000   PCI x 2 - ostial & prox-mid LAD (BMS) for atretic LIMA-LAD   CAD in native artery 1995   CABG x 3 - LIMA-LAD, SVG-OM2, SVG-rPDA   Chest pain 09/2017   DM (diabetes mellitus) type II controlled peripheral vascular disorder 2002   Left  subclavian and left vertebral artery stenoses   Glaucoma    Hyperlipidemia LDL goal <70    Hypertension, essential    S/P CABG x 3 1995    S/P Redo CABG x 1 2001   L Radial-LAD after 2 failed attempts @ LIMA-LAD PTCA; LAD stents 100% occluded   Stenosis of left subclavian artery ([place]) 11/2003   Status post PTA/stent --> < 50 % stenosis by Dopplers 09/2013   Past Surgical History:  Procedure Laterality Date   CARDIAC CATHETERIZATION N/A 08/04/2014   Procedure: Left Heart Cath and Cors/Grafts Angiography;  Surgeon: Leonie Man, MD;  Location: [place] [facility];  Service: Cardiovascular: o-mRCA 70%, m-dRCA ~70% - SVG-dRCA Patent.  o-pLAD 100% stent occluded, mLAD 95% ISR with poor retrograde filling from freeRadiial graft-LAD (50% ostial graft dz).  o-p Cx 80%. Widely patent SVG-Cx-OM fills retrograde to 80% lesion.; Normal LV Fxn & EDP   CORONARY ANGIOPLASTY  April and May 2001   After Both LAD stents occluded - PTCA of anastomatic LIMA-LAD lesion -- Unsuccessful.   CORONARY ARTERY BYPASS GRAFT  1995   LIMA-LAD, SVG-OM2, SVG-rPDA   CORONARY ARTERY BYPASS GRAFT  06/1999   Dr. Lucianne Lei Trigt: Redo LAD grafting with free Left Radial-distal LAD   CORONARY STENT PLACEMENT  1995-2000   2 BMS stents to osital-proximal & proximal-mid LAD; because of atretic LIMA-LDA   LEFT HEART CATH AND CORONARY ANGIOGRAPHY N/A 09/18/2017   Procedure: LEFT HEART CATH AND CORONARY ANGIOGRAPHY;  Surgeon: Martinique, Peter M,  MD;  Location: [place] [facility];  Service: Cardiovascular;  Laterality: N/A;   LEFT HEART CATHETERIZATION WITH CORONARY/GRAFT ANGIOGRAM N/A 08/22/2011   Procedure: LEFT HEART CATHETERIZATION WITH Beatrix Fetters;  Surgeon: Leonie Man, MD;  Location: [facility] C[facility];  Service: Cardiovascular:  Known occluded LIMA-LAD and ostial LAD. Moderate to severe proximal circumflex and RCA disease. Widely patent freeLRAD-dLAD, as well as SVG-RPDA (backfilling RPL), SVG-OM 2 (backfilling OM1)   SHOULDER  OPEN ROTATOR CUFF REPAIR  10/2002   Dr. Noemi Chapel   SP [facility] VERT OR THOR CAROTID STENT Left October 2002; November 2005   Left Vertebral stent placed in October 2002 (Dr. Patrecia Pour); redo PCI in 2005   [place] Left 11/2003   Dr. Gwenlyn Found   TRANSTHORACIC ECHOCARDIOGRAM  04/10/2020   [facility]): Normal LV size and function.  EF 60 to 65%.  GR 1 DD.  Mild AI.  Mild to moderate LA dilation.  GR 1 DD.[name]   TRANSTHORACIC ECHOCARDIOGRAM  02/2021   EF 70 to 75%.  Hyperdynamic.  No RWMA.  GR 1 DD.  Normal RV, RVP and low normal RAP.  Normal valves.    Allergies  Allergies  Allergen Reactions   Iohexol Other (See Comments)    Intractable shaking.   Contrast Media [Iodinated Contrast Media]     Shivering & shaking. Pt reports takes antihistamine prior to procedures.   Metformin And Related Diarrhea    Subsequently discontinued    History of Present Illness    [name] is a 88 y.o. male who presents via audio/video conferencing for a telehealth visit today.  Pt was last seen in cardiology clinic on 03/19/2021 by Dr. Ellyn Hack.  At that time [name] was doing well .  The patient is now pending procedure as outlined above. Since his last visit, he remains stable from a cardiac standpoint.  Today he denies chest pain, shortness of breath, lower extremity edema, fatigue, palpitations, melena, hematuria, hemoptysis, diaphoresis, weakness, presyncope, syncope, orthopnea, and PND.    Home Medications    Prior to Admission medications   Medication Sig Start Date End Date Taking? Authorizing Provider  acetaminophen (TYLENOL) 500 MG tablet Take 1,000 mg by mouth every 6 (six) hours as needed for mild pain or headache.    [provider]  albuterol (VENTOLIN HFA) 108 (90 Base) MCG/ACT inhaler INHALE TWO PUFFS BY MOUTH FOUR TIMES DAILY 03/04/20   [provider]  amLODipine (NORVASC) 2.5 MG tablet Take 2.5 mg by mouth as needed. Patient not taking:  Reported on 05/20/2021 04/05/21   [provider]  aspirin EC 81 MG tablet Take 81 mg by mouth daily with breakfast.     [provider]  Brinzolamide-Brimonidine 1-0.2 % SUSP Place 1 drop into the right eye 3 (three) times daily.    [provider]  Cholecalciferol (VITAMIN D) 2000 units tablet Take 2,000 Units by mouth daily with breakfast.    [provider]  clopidogrel (PLAVIX) 75 MG tablet Take 75 mg by mouth daily.    [provider]  doxazosin (CARDURA) 8 MG tablet Take 1 tablet (8 mg total) by mouth at bedtime. 02/05/21   Barton Dubois, MD  finasteride (PROSCAR) 5 MG tablet Take 5 mg by mouth daily. 01/15/21   [provider]  fludrocortisone (FLORINEF) 0.1 MG tablet Take 100 mcg by mouth daily. 01/19/21   [provider]  gabapentin (NEURONTIN) 300 MG capsule Take 600 mg by mouth See admin instructions. Take  two capsules (600 mg) by mouth three times daily - breakfast, supper and bedtime    [provider]  hydrocortisone cream 1 % Apply 1 application. topically daily as needed for itching.    [provider]  isosorbide mononitrate (IMDUR) 60 MG 24 hr tablet May take 1 and 1/2 tablets to 2 tablets a day depending on if chest discomfort is present Patient taking differently: 30 mg daily. 11/21/16   Leonie Man, MD  JARDIANCE 25 MG TABS tablet Take 25 mg by mouth daily. Patient not taking: Reported on 05/20/2021 01/13/21   [provider]  nitroGLYCERIN (NITROSTAT) 0.4 MG SL tablet Place 1 tablet (0.4 mg total) under the tongue every 5 (five) minutes as needed for up to 25 days for chest pain. And increase blood pressure greater than 536 systolic 4/68/03 02/14/22  Leonie Man, MD  Omega-3 Fatty Acids (FISH OIL) 1000 MG CAPS Take 1,000 mg by mouth 2 (two) times daily with a meal.     [provider]  omeprazole (PRILOSEC) 20 MG capsule Take 20 mg by mouth daily. 09/06/19   [provider]  prednisoLONE acetate (PRED FORTE) 1 % ophthalmic suspension Place 1 drop into both eyes 2 (two) times daily.    [provider]  rosuvastatin (CRESTOR) 40 MG tablet TAKE 1 TABLET BY MOUTH DAILY 03/26/21   Leonie Man, MD  timolol (TIMOPTIC) 0.5 % ophthalmic solution Place 1 drop into both eyes daily.    [provider]  TRESIBA FLEXTOUCH 100 UNIT/ML FlexTouch Pen Inject into the skin. 02/16/21   [provider]  TRULICITY 8.25 OI/3.7CW SOPN Inject 20 mg into the skin daily. Patient not taking: Reported on 05/20/2021 04/27/20   [provider]  vitamin C (ASCORBIC ACID) 500 MG tablet Take 500 mg by mouth 2 (two) times daily with a meal.     [provider]    Physical Exam    Vital Signs:  [name] does not have vital signs available for review today.  Given telephonic nature of communication, physical exam is limited. AAOx3. NAD. Normal affect.  Speech and respirations are unlabored.  Accessory Clinical Findings    None  Assessment & Plan    1.  Preoperative Cardiovascular Risk Assessment: EGD/dilation, Dr. Abbey Chatters     Primary Cardiologist: Glenetta Hew, MD  Chart reviewed as part of pre-operative protocol coverage. Given past medical history and time since last visit, based on ACC/AHA guidelines, [name] would be at acceptable risk for the planned procedure without further cardiovascular testing.   Patient was advised that if he develops new symptoms prior to surgery to contact our office to arrange a follow-up appointment.  He verbalized understanding.   His RCRI is a class IV risk, 11% risk of major cardiac event.  He is able to complete greater than 4 METS of physical activity.    A copy of this note will be routed to requesting surgeon.  Time:   Today, I have spent 5 minutes with the patient with telehealth technology discussing medical history, symptoms, and management plan.  Prior to his phone evaluation  I spent greater than 10 minutes reviewing his past medical history and medications.   Deberah Pelton, NP  05/26/2021, 11:55 AM

## 2021-06-02 DIAGNOSIS — I482 Chronic atrial fibrillation, unspecified: Secondary | ICD-10-CM | POA: Diagnosis not present

## 2021-06-02 DIAGNOSIS — I10 Essential (primary) hypertension: Secondary | ICD-10-CM | POA: Diagnosis not present

## 2021-06-02 DIAGNOSIS — Z794 Long term (current) use of insulin: Secondary | ICD-10-CM | POA: Diagnosis not present

## 2021-06-02 DIAGNOSIS — E1165 Type 2 diabetes mellitus with hyperglycemia: Secondary | ICD-10-CM | POA: Diagnosis not present

## 2021-06-04 ENCOUNTER — Telehealth: Payer: Self-pay

## 2021-06-04 NOTE — Telephone Encounter (Signed)
The pt's clearance note is available in his 05/26/2021 note. Pt is a acceptable risk.

## 2021-06-07 NOTE — Telephone Encounter (Signed)
Fowarding to Dr. Abbey Chatters to review

## 2021-06-09 NOTE — Telephone Encounter (Signed)
LMOVM to schedule EGD/DIL with Dr. Abbey Chatters, ASA 3

## 2021-06-09 NOTE — Telephone Encounter (Signed)
Okay to schedule.  Thank

## 2021-06-10 ENCOUNTER — Encounter: Payer: Self-pay | Admitting: *Deleted

## 2021-06-10 DIAGNOSIS — R0602 Shortness of breath: Secondary | ICD-10-CM | POA: Diagnosis not present

## 2021-06-10 DIAGNOSIS — I10 Essential (primary) hypertension: Secondary | ICD-10-CM | POA: Diagnosis not present

## 2021-06-10 DIAGNOSIS — E1165 Type 2 diabetes mellitus with hyperglycemia: Secondary | ICD-10-CM | POA: Diagnosis not present

## 2021-06-10 DIAGNOSIS — E785 Hyperlipidemia, unspecified: Secondary | ICD-10-CM | POA: Diagnosis not present

## 2021-06-10 DIAGNOSIS — Z91041 Radiographic dye allergy status: Secondary | ICD-10-CM | POA: Diagnosis not present

## 2021-06-10 DIAGNOSIS — E1142 Type 2 diabetes mellitus with diabetic polyneuropathy: Secondary | ICD-10-CM | POA: Diagnosis not present

## 2021-06-10 DIAGNOSIS — R0789 Other chest pain: Secondary | ICD-10-CM | POA: Diagnosis not present

## 2021-06-10 DIAGNOSIS — Z87891 Personal history of nicotine dependence: Secondary | ICD-10-CM | POA: Diagnosis not present

## 2021-06-10 DIAGNOSIS — Z888 Allergy status to other drugs, medicaments and biological substances status: Secondary | ICD-10-CM | POA: Diagnosis not present

## 2021-06-10 DIAGNOSIS — I2510 Atherosclerotic heart disease of native coronary artery without angina pectoris: Secondary | ICD-10-CM | POA: Diagnosis not present

## 2021-06-10 DIAGNOSIS — Z79899 Other long term (current) drug therapy: Secondary | ICD-10-CM | POA: Diagnosis not present

## 2021-06-10 DIAGNOSIS — R42 Dizziness and giddiness: Secondary | ICD-10-CM | POA: Diagnosis not present

## 2021-06-10 DIAGNOSIS — Z7982 Long term (current) use of aspirin: Secondary | ICD-10-CM | POA: Diagnosis not present

## 2021-06-10 DIAGNOSIS — Z87898 Personal history of other specified conditions: Secondary | ICD-10-CM | POA: Diagnosis not present

## 2021-06-10 DIAGNOSIS — E1151 Type 2 diabetes mellitus with diabetic peripheral angiopathy without gangrene: Secondary | ICD-10-CM | POA: Diagnosis not present

## 2021-06-10 DIAGNOSIS — Z955 Presence of coronary angioplasty implant and graft: Secondary | ICD-10-CM | POA: Diagnosis not present

## 2021-06-10 DIAGNOSIS — Z91013 Allergy to seafood: Secondary | ICD-10-CM | POA: Diagnosis not present

## 2021-06-10 DIAGNOSIS — Z794 Long term (current) use of insulin: Secondary | ICD-10-CM | POA: Diagnosis not present

## 2021-06-10 DIAGNOSIS — R634 Abnormal weight loss: Secondary | ICD-10-CM | POA: Diagnosis not present

## 2021-06-10 DIAGNOSIS — Z951 Presence of aortocoronary bypass graft: Secondary | ICD-10-CM | POA: Diagnosis not present

## 2021-06-10 DIAGNOSIS — Z6820 Body mass index (BMI) 20.0-20.9, adult: Secondary | ICD-10-CM | POA: Diagnosis not present

## 2021-06-10 DIAGNOSIS — R079 Chest pain, unspecified: Secondary | ICD-10-CM | POA: Diagnosis not present

## 2021-06-10 NOTE — Telephone Encounter (Signed)
Called pt. Spoke with pt and daughter [name]. Pt scheduled for 7/5 at 9:15am. Aware will mail prep instructions and pre-op appt.  PA submitted via [place]. Auth# [id number], DOS: Jul 07, 2021 - Oct 05, 2021

## 2021-06-24 DIAGNOSIS — E1165 Type 2 diabetes mellitus with hyperglycemia: Secondary | ICD-10-CM | POA: Diagnosis not present

## 2021-06-24 DIAGNOSIS — R634 Abnormal weight loss: Secondary | ICD-10-CM | POA: Diagnosis not present

## 2021-06-24 DIAGNOSIS — Z87898 Personal history of other specified conditions: Secondary | ICD-10-CM | POA: Diagnosis not present

## 2021-06-24 DIAGNOSIS — Z6820 Body mass index (BMI) 20.0-20.9, adult: Secondary | ICD-10-CM | POA: Diagnosis not present

## 2021-06-24 DIAGNOSIS — R030 Elevated blood-pressure reading, without diagnosis of hypertension: Secondary | ICD-10-CM | POA: Diagnosis not present

## 2021-06-24 DIAGNOSIS — R079 Chest pain, unspecified: Secondary | ICD-10-CM | POA: Diagnosis not present

## 2021-06-24 LAB — HEMOGLOBIN A1C: Hemoglobin A1C: 10.1

## 2021-06-30 NOTE — Patient Instructions (Signed)
20    Your procedure is scheduled on: 07/07/2021  Report to [place] Entrance at  7:30   AM.  Call this number if you have problems the morning of surgery: [phone]   Remember:   Follow instructions on letter from office regarding when to stop eating and drinking        No Smoking the day of procedure      Take these medicines the morning of surgery with A SIP OF WATER: isosorbide, omeprazole, Finasteride, and Gabapentin  No diabetic medication am of procedure  Use inhalers if needed   Do not wear jewelry, make-up or nail polish.  Do not wear lotions, powders, or perfumes. You may wear deodorant.                Do not bring valuables to the hospital.  Contacts, dentures or bridgework may not be worn into surgery.  Leave suitcase in the car. After surgery it may be brought to your room.  For patients admitted to the hospital, checkout time is 11:00 AM the day of discharge.   Patients discharged the day of surgery will not be allowed to drive home. Upper Endoscopy, Adult Upper endoscopy is a procedure to look inside the upper GI (gastrointestinal) tract. The upper GI tract is made up of: The part of the body that moves food from your mouth to your stomach (esophagus). The stomach. The first part of your small intestine (duodenum). This procedure is also called esophagogastroduodenoscopy (EGD) or gastroscopy. In this procedure, your health care provider passes a thin, flexible tube (endoscope) through your mouth and down your esophagus into your stomach. A small camera is attached to the end of the tube. Images from the camera appear on a monitor in the exam room. During this procedure, your health care provider may also remove a small piece of tissue to be sent to a lab and examined under a microscope (biopsy). Your health care provider may do an upper endoscopy to diagnose cancers of the upper GI tract. You may also have this procedure to find the cause of other conditions, such  as: Stomach pain. Heartburn. Pain or problems when swallowing. Nausea and vomiting. Stomach bleeding. Stomach ulcers. Tell a health care provider about: Any allergies you have. All medicines you are taking, including vitamins, herbs, eye drops, creams, and over-the-counter medicines. Any problems you or family members have had with anesthetic medicines. Any blood disorders you have. Any surgeries you have had. Any medical conditions you have. Whether you are pregnant or may be pregnant. What are the risks? Generally, this is a safe procedure. However, problems may occur, including: Infection. Bleeding. Allergic reactions to medicines. A tear or hole (perforation) in the esophagus, stomach, or duodenum. What happens before the procedure? Staying hydrated Follow instructions from your health care provider about hydration, which may include: Up to 4 hours before the procedure - you may continue to drink clear liquids, such as water, clear fruit juice, black coffee, and plain tea.   Medicines Ask your health care provider about: Changing or stopping your regular medicines. This is especially important if you are taking diabetes medicines or blood thinners. Taking medicines such as aspirin and ibuprofen. These medicines can thin your blood. Do not take these medicines unless your health care provider tells you to take them. Taking over-the-counter medicines, vitamins, herbs, and supplements. General instructions Plan to have someone take you home from the hospital or clinic. If you will be going home right  after the procedure, plan to have someone with you for 24 hours. Ask your health care provider what steps will be taken to help prevent infection. What happens during the procedure?  An IV will be inserted into one of your veins. You may be given one or more of the following: A medicine to help you relax (sedative). A medicine to numb the throat (local anesthetic). You will lie  on your left side on an exam table. Your health care provider will pass the endoscope through your mouth and down your esophagus. Your health care provider will use the scope to check the inside of your esophagus, stomach, and duodenum. Biopsies may be taken. The endoscope will be removed. The procedure may vary among health care providers and hospitals. What happens after the procedure? Your blood pressure, heart rate, breathing rate, and blood oxygen level will be monitored until you leave the hospital or clinic. Do not drive for 24 hours if you were given a sedative during your procedure. When your throat is no longer numb, you may be given some fluids to drink. It is up to you to get the results of your procedure. Ask your health care provider, or the department that is doing the procedure, when your results will be ready. Summary Upper endoscopy is a procedure to look inside the upper GI tract. During the procedure, an IV will be inserted into one of your veins. You may be given a medicine to help you relax. A medicine will be used to numb your throat. The endoscope will be passed through your mouth and down your esophagus. This information is not intended to replace advice given to you by your health care provider. Make sure you discuss any questions you have with your health care provider. Document Revised: 06/14/2017 Document Reviewed: 05/22/2017 Elsevier Patient Education  [place] After  Please read the instructions outlined below and refer to this sheet in the next few weeks. These discharge instructions provide you with general information on caring for yourself after you leave the hospital. Your doctor may also give you specific instructions. While your treatment has been planned according to the most current medical  practices available, unavoidable complications occasionally occur. If you have any problems or questions after discharge, please call your doctor. HOME CARE INSTRUCTIONS Activity You may resume your regular activity but move at a slower pace for the next 24 hours.  Take frequent rest periods for the next 24 hours.  Walking will help expel (get rid of) the air and reduce the bloated feeling in your abdomen.  No driving for 24 hours (because of the anesthesia (medicine) used during the test).  You may shower.  Do not sign any important  legal documents or operate any machinery for 24 hours (because of the anesthesia used during the test).  Nutrition Drink plenty of fluids.  You may resume your normal diet.  Begin with a light meal and progress to your normal diet.  Avoid alcoholic beverages for 24 hours or as instructed by your caregiver.  Medications You may resume your normal medications unless your caregiver tells you otherwise. What you can expect today You may experience abdominal discomfort such as a feeling of fullness or "gas" pains.  You may experience a sore throat for 2 to 3 days. This is normal. Gargling with salt water may help this.  Follow-up Your doctor will discuss the results of your test with you. SEEK IMMEDIATE MEDICAL CARE IF: You have excessive nausea (feeling sick to your stomach) and/or vomiting.  You have severe abdominal pain and distention (swelling).  You have trouble swallowing.  You have a temperature over 100 F (37.8 C).  You have rectal bleeding or vomiting of blood.  Document Released: 08/04/2003 Document Revised: 12/09/2010 Document Reviewed: 02/14/2007  Esophageal Dilatation Esophageal dilatation, also called esophageal dilation, is a procedure to widen or open a blocked or narrowed part of the esophagus. The esophagus is the part of the body that moves food and liquid from the mouth to the stomach. You may need this procedure if: You have a buildup of  scar tissue in your esophagus that makes it difficult, painful, or impossible to swallow. This can be caused by gastroesophageal reflux disease (GERD). You have cancer of the esophagus. There is a problem with how food moves through your esophagus. In some cases, you may need this procedure repeated at a later time to dilate the esophagus gradually. Tell a health care provider about: Any allergies you have. All medicines you are taking, including vitamins, herbs, eye drops, creams, and over-the-counter medicines. Any problems you or family members have had with anesthetic medicines. Any blood disorders you have. Any surgeries you have had. Any medical conditions you have. Any antibiotic medicines you are required to take before dental procedures. Whether you are pregnant or may be pregnant. What are the risks? Generally, this is a safe procedure. However, problems may occur, including: Bleeding due to a tear in the lining of the esophagus. A hole, or perforation, in the esophagus. What happens before the procedure? Ask your health care provider about: Changing or stopping your regular medicines. This is especially important if you are taking diabetes medicines or blood thinners. Taking medicines such as aspirin and ibuprofen. These medicines can thin your blood. Do not take these medicines unless your health care provider tells you to take them. Taking over-the-counter medicines, vitamins, herbs, and supplements. Follow instructions from your health care provider about eating or drinking restrictions. Plan to have a responsible adult take you home from the hospital or clinic. Plan to have a responsible adult care for you for the time you are told after you leave the hospital or clinic. This is important. What happens during the procedure? You may be given a medicine to help you relax (sedative). A numbing medicine may be sprayed into the back of your throat, or you may gargle the  medicine. Your health care provider may perform the dilatation using various surgical instruments, such as: Simple dilators. This instrument is carefully placed in the esophagus to stretch it. Guided wire bougies. This involves using an endoscope to insert a wire into the esophagus. A dilator is passed over this wire to enlarge the  esophagus. Then the wire is removed. Balloon dilators. An endoscope with a small balloon is inserted into the esophagus. The balloon is inflated to stretch the esophagus and open it up. The procedure may vary among health care providers and hospitals. What can I expect after the procedure? Your blood pressure, heart rate, breathing rate, and blood oxygen level will be monitored until you leave the hospital or clinic. Your throat may feel slightly sore and numb. This will get better over time. You will not be allowed to eat or drink until your throat is no longer numb. When you are able to drink, urinate, and sit on the edge of the bed without nausea or dizziness, you may be able to return home. Follow these instructions at home: Take over-the-counter and prescription medicines only as told by your health care provider. If you were given a sedative during the procedure, it can affect you for several hours. Do not drive or operate machinery until your health care provider says that it is safe. Plan to have a responsible adult care for you for the time you are told. This is important. Follow instructions from your health care provider about any eating or drinking restrictions. Do not use any products that contain nicotine or tobacco, such as cigarettes, e-cigarettes, and chewing tobacco. If you need help quitting, ask your health care provider. Keep all follow-up visits. This is important. Contact a health care provider if: You have a fever. You have pain that is not relieved by medicine. Get help right away if: You have chest pain. You have trouble breathing. You  have trouble swallowing. You vomit blood. You have black, tarry, or bloody stools. These symptoms may represent a serious problem that is an emergency. Do not wait to see if the symptoms will go away. Get medical help right away. Call your local emergency services (911 in the [place] Do not drive yourself to the hospital. Summary Esophageal dilatation, also called esophageal dilation, is a procedure to widen or open a blocked or narrowed part of the esophagus. Plan to have a responsible adult take you home from the hospital or clinic. For this procedure, a numbing medicine may be sprayed into the back of your throat, or you may gargle the medicine. Do not drive or operate machinery until your health care provider says that it is safe. This information is not intended to replace advice given to you by your health care provider. Make sure you discuss any questions you have with your health care provider. Document Revised: 05/08/2019 Document Reviewed: 05/08/2019 Elsevier Patient Education  [place].

## 2021-07-05 ENCOUNTER — Encounter (HOSPITAL_COMMUNITY)
Admission: RE | Admit: 2021-07-05 | Discharge: 2021-07-05 | Disposition: A | Discharge status: Home / Self Care | Payer: Medicare Other | Source: Ambulatory Visit | Attending: Internal Medicine | Admitting: Internal Medicine

## 2021-07-05 ENCOUNTER — Encounter (HOSPITAL_COMMUNITY): Payer: Self-pay

## 2021-07-05 ENCOUNTER — Other Ambulatory Visit: Payer: Self-pay

## 2021-07-05 VITALS — BP 139/49 | HR 53 | Temp 97.3°F | Ht 67.0 in | Wt 124.0 lb

## 2021-07-05 DIAGNOSIS — E1165 Type 2 diabetes mellitus with hyperglycemia: Secondary | ICD-10-CM | POA: Diagnosis not present

## 2021-07-05 HISTORY — DX: Gastro-esophageal reflux disease without esophagitis: K21.9

## 2021-07-05 HISTORY — DX: Unspecified osteoarthritis, unspecified site: M19.90

## 2021-07-05 LAB — BASIC METABOLIC PANEL
Anion gap: 5 (ref 5–15)
BUN: 17 mg/dL (ref 8–23)
CO2: 27 mmol/L (ref 22–32)
Calcium: 9.4 mg/dL (ref 8.9–10.3)
Chloride: 104 mmol/L (ref 98–111)
Creatinine, Ser: 0.92 mg/dL (ref 0.61–1.24)
GFR, Estimated: >60 mL/min (ref >60)
Glucose, Bld: 294 mg/dL — ABNORMAL HIGH (ref 70–99)
Potassium: 4.2 mmol/L (ref 3.5–5.1)
Sodium: 136 mmol/L (ref 135–145)

## 2021-07-07 ENCOUNTER — Ambulatory Visit (HOSPITAL_COMMUNITY): Payer: Medicare Other | Admitting: Anesthesiology

## 2021-07-07 ENCOUNTER — Encounter (HOSPITAL_COMMUNITY): Payer: Self-pay

## 2021-07-07 ENCOUNTER — Ambulatory Visit (HOSPITAL_COMMUNITY)
Admission: RE | Admit: 2021-07-07 | Discharge: 2021-07-07 | Disposition: A | Discharge status: Home / Self Care | Payer: Medicare Other | Attending: Internal Medicine | Admitting: Internal Medicine

## 2021-07-07 ENCOUNTER — Ambulatory Visit (HOSPITAL_BASED_OUTPATIENT_CLINIC_OR_DEPARTMENT_OTHER): Payer: Medicare Other | Admitting: Anesthesiology

## 2021-07-07 ENCOUNTER — Other Ambulatory Visit: Payer: Self-pay

## 2021-07-07 ENCOUNTER — Encounter (HOSPITAL_COMMUNITY)
Admission: RE | Disposition: A | Discharge status: Home / Self Care | Payer: Self-pay | Source: Home / Self Care | Attending: Internal Medicine

## 2021-07-07 DIAGNOSIS — R1310 Dysphagia, unspecified: Secondary | ICD-10-CM | POA: Insufficient documentation

## 2021-07-07 DIAGNOSIS — E1151 Type 2 diabetes mellitus with diabetic peripheral angiopathy without gangrene: Secondary | ICD-10-CM | POA: Insufficient documentation

## 2021-07-07 DIAGNOSIS — Z87891 Personal history of nicotine dependence: Secondary | ICD-10-CM

## 2021-07-07 DIAGNOSIS — Z7984 Long term (current) use of oral hypoglycemic drugs: Secondary | ICD-10-CM | POA: Insufficient documentation

## 2021-07-07 DIAGNOSIS — I25119 Atherosclerotic heart disease of native coronary artery with unspecified angina pectoris: Secondary | ICD-10-CM | POA: Diagnosis not present

## 2021-07-07 DIAGNOSIS — K222 Esophageal obstruction: Secondary | ICD-10-CM

## 2021-07-07 DIAGNOSIS — I10 Essential (primary) hypertension: Secondary | ICD-10-CM | POA: Insufficient documentation

## 2021-07-07 DIAGNOSIS — K219 Gastro-esophageal reflux disease without esophagitis: Secondary | ICD-10-CM | POA: Insufficient documentation

## 2021-07-07 DIAGNOSIS — Z681 Body mass index (BMI) 19 or less, adult: Secondary | ICD-10-CM | POA: Insufficient documentation

## 2021-07-07 DIAGNOSIS — B3781 Candidal esophagitis: Secondary | ICD-10-CM

## 2021-07-07 DIAGNOSIS — R634 Abnormal weight loss: Secondary | ICD-10-CM | POA: Insufficient documentation

## 2021-07-07 HISTORY — PX: BALLOON DILATION: SHX5330

## 2021-07-07 HISTORY — PX: ESOPHAGOGASTRODUODENOSCOPY (EGD) WITH PROPOFOL: SHX5813

## 2021-07-07 HISTORY — PX: ESOPHAGEAL BRUSHING: SHX6842

## 2021-07-07 LAB — KOH PREP

## 2021-07-07 LAB — GLUCOSE, CAPILLARY
Glucose-Capillary: 271 mg/dL — ABNORMAL HIGH (ref 70–99)
Glucose-Capillary: 256 mg/dL — ABNORMAL HIGH (ref 70–99)

## 2021-07-07 SURGERY — ESOPHAGOGASTRODUODENOSCOPY (EGD) WITH PROPOFOL
Anesthesia: General

## 2021-07-07 MED ORDER — LIDOCAINE HCL (CARDIAC) PF 100 MG/5ML IV SOSY
PREFILLED_SYRINGE | INTRAVENOUS | Status: DC | PRN
  Administered 2021-07-07: 50 mg via INTRAVENOUS

## 2021-07-07 MED ORDER — PROPOFOL 10 MG/ML IV BOLUS
INTRAVENOUS | Status: DC | PRN
  Administered 2021-07-07: 50 mg via INTRAVENOUS
  Administered 2021-07-07: 100 mg via INTRAVENOUS

## 2021-07-07 MED ORDER — PHENYLEPHRINE 80 MCG/ML (10ML) SYRINGE FOR IV PUSH (FOR BLOOD PRESSURE SUPPORT)
PREFILLED_SYRINGE | INTRAVENOUS | Status: DC | PRN
  Administered 2021-07-07 (×2): 80 ug via INTRAVENOUS

## 2021-07-07 MED ORDER — LACTATED RINGERS IV SOLN
INTRAVENOUS | Status: DC
Start: 2021-07-07 — End: 2021-07-07

## 2021-07-07 NOTE — H&P (Signed)
Primary Care Physician:  Caryl Bis, MD Primary Gastroenterologist:  Dr. Abbey Chatters  Pre-Procedure History & Physical: HPI:  [name] is a 88 y.o. male is here for an EGD with possible dilation to be performed for dysphagia, weight loss.   Past Medical History:  Diagnosis Date   Arthritis    Asymptomatic stenosis of left vertebral artery 2002, 2005   Status post PTA/stent with redo; normal antegrade flow on dopplers 09/2013   CAD (coronary artery disease) 1995   1CABG x3; redo in CABG x 1 2001 Free Radial to LAD; All grafts patent by Cath 08/2014: The proximal to mid LAD has poor retrograde filling from the LIMA due to in-stent restenosis. 50% ostial disease of free radial artery to the LAD.   CAD (coronary artery disease) of artery bypass graft 2000   PCI x 2 - ostial & prox-mid LAD (BMS) for atretic LIMA-LAD   CAD in native artery 1995   CABG x 3 - LIMA-LAD, SVG-OM2, SVG-rPDA   Chest pain 09/2017   DM (diabetes mellitus) type II controlled peripheral vascular disorder 2002   Left subclavian and left vertebral artery stenoses   GERD (gastroesophageal reflux disease)    Glaucoma    Hyperlipidemia LDL goal <70    Hypertension, essential    S/P CABG x 3 1995   S/P Redo CABG x 1 2001   L Radial-LAD after 2 failed attempts @ LIMA-LAD PTCA; LAD stents 100% occluded   Stenosis of left subclavian artery ([place]) 11/2003   Status post PTA/stent --> < 50 % stenosis by Dopplers 09/2013    Past Surgical History:  Procedure Laterality Date   CARDIAC CATHETERIZATION N/A 08/04/2014   Procedure: Left Heart Cath and Cors/Grafts Angiography;  Surgeon: Leonie Man, MD;  Location: [facility];  Service: Cardiovascular: o-mRCA 70%, m-dRCA ~70% - SVG-dRCA Patent.  o-pLAD 100% stent occluded, mLAD 95% ISR with poor retrograde filling from freeRadiial graft-LAD (50% ostial graft dz).  o-p Cx 80%. Widely patent SVG-Cx-OM fills retrograde to 80% lesion.; Normal LV Fxn & EDP   CORONARY  ANGIOPLASTY  April and May 2001   After Both LAD stents occluded - PTCA of anastomatic LIMA-LAD lesion -- Unsuccessful.   CORONARY ARTERY BYPASS GRAFT  1995   LIMA-LAD, SVG-OM2, SVG-rPDA   CORONARY ARTERY BYPASS GRAFT  06/1999   Dr. Lucianne Lei Trigt: Redo LAD grafting with free Left Radial-distal LAD   CORONARY STENT PLACEMENT  1995-2000   2 BMS stents to osital-proximal & proximal-mid LAD; because of atretic LIMA-LDA   LEFT HEART CATH AND CORONARY ANGIOGRAPHY N/A 09/18/2017   Procedure: LEFT HEART CATH AND CORONARY ANGIOGRAPHY;  Surgeon: Martinique, Peter M, MD;  Location: [facility];  Service: Cardiovascular;  Laterality: N/A;   LEFT HEART CATHETERIZATION WITH CORONARY/GRAFT ANGIOGRAM N/A 08/22/2011   Procedure: LEFT HEART CATHETERIZATION WITH Beatrix Fetters;  Surgeon: Leonie Man, MD;  Location: [facility];  Service: Cardiovascular:  Known occluded LIMA-LAD and ostial LAD. Moderate to severe proximal circumflex and RCA disease. Widely patent freeLRAD-dLAD, as well as SVG-RPDA (backfilling RPL), SVG-OM 2 (backfilling OM1)   SHOULDER OPEN ROTATOR CUFF REPAIR  10/2002   Dr. Noemi Chapel   SP [facility] VERT OR THOR CAROTID STENT Left October 2002; November 2005   Left Vertebral stent placed in October 2002 (Dr. Patrecia Pour); redo PCI in 2005   [place] Left 11/2003   Dr. Gwenlyn Found   TRANSTHORACIC ECHOCARDIOGRAM  04/10/2020   [facility]): Normal LV size and  function.  EF 60 to 65%.  GR 1 DD.  Mild AI.  Mild to moderate LA dilation.  GR 1 DD.[name]   TRANSTHORACIC ECHOCARDIOGRAM  02/2021   EF 70 to 75%.  Hyperdynamic.  No RWMA.  GR 1 DD.  Normal RV, RVP and low normal RAP.  Normal valves.    Prior to Admission medications   Medication Sig Start Date End Date Taking? Authorizing Provider  acetaminophen (TYLENOL) 500 MG tablet Take 1,000 mg by mouth every 6 (six) hours as needed for mild pain or headache.   Yes [provider]  albuterol (VENTOLIN HFA) 108 (90 Base)  MCG/ACT inhaler Inhale 2 puffs into the lungs every 6 (six) hours as needed for wheezing or shortness of breath. 03/04/20  Yes [provider]  aspirin EC 81 MG tablet Take 81 mg by mouth daily with breakfast.    Yes [provider]  Brinzolamide-Brimonidine 1-0.2 % SUSP Place 1 drop into the right eye 3 (three) times daily.   Yes [provider]  Cholecalciferol (VITAMIN D) 2000 units tablet Take 2,000 Units by mouth daily with breakfast.   Yes [provider]  clopidogrel (PLAVIX) 75 MG tablet Take 75 mg by mouth daily.   Yes [provider]  doxazosin (CARDURA) 8 MG tablet Take 1 tablet (8 mg total) by mouth at bedtime. 02/05/21  Yes Barton Dubois, MD  finasteride (PROSCAR) 5 MG tablet Take 5 mg by mouth daily. 01/15/21  Yes [provider]  gabapentin (NEURONTIN) 300 MG capsule Take 600 mg by mouth 3 (three) times daily.   Yes [provider]  isosorbide mononitrate (IMDUR) 60 MG 24 hr tablet May take 1 and 1/2 tablets to 2 tablets a day depending on if chest discomfort is present Patient taking differently: Take 120 mg by mouth daily. 11/21/16  Yes Leonie Man, MD  JARDIANCE 25 MG TABS tablet Take 25 mg by mouth daily. 01/13/21  Yes [provider]  Omega-3 Fatty Acids (FISH OIL) 1000 MG CAPS Take 1,000 mg by mouth 2 (two) times daily with a meal.    Yes [provider]  omeprazole (PRILOSEC) 20 MG capsule Take 20 mg by mouth daily. 09/06/19  Yes [provider]  prednisoLONE acetate (PRED FORTE) 1 % ophthalmic suspension Place 1 drop into the left eye daily.   Yes [provider]  rosuvastatin (CRESTOR) 40 MG tablet TAKE 1 TABLET BY MOUTH DAILY 03/26/21  Yes Leonie Man, MD  TRESIBA FLEXTOUCH 100 UNIT/ML FlexTouch Pen Inject 10 Units into the skin daily. 02/16/21  Yes [provider]  vitamin C (ASCORBIC ACID) 500 MG tablet Take 500 mg by mouth 2 (two) times daily with a meal.    Yes  [provider]  nitroGLYCERIN (NITROSTAT) 0.4 MG SL tablet Place 1 tablet (0.4 mg total) under the tongue every 5 (five) minutes as needed for up to 25 days for chest pain. And increase blood pressure greater than 292 systolic 4/46/28 6/38/17  Leonie Man, MD    Allergies as of 06/10/2021 - Review Complete 05/26/2021  Allergen Reaction Noted   Iohexol Other (See Comments) 11/03/2002   Contrast media [iodinated contrast media]  11/09/2012   Metformin and related Diarrhea 11/11/2012    Family History  Problem Relation Age of Onset   Diabetes Mother    Diabetes Sister    Diabetes Brother     Social History   Socioeconomic History   Marital status: Married  Spouse name: Not on file   Number of children: Not on file   Years of education: Not on file   Highest education level: Not on file  Occupational History   Not on file  Tobacco Use   Smoking status: Former    Packs/day: 3.00    Years: 30.00    Total pack years: 90.00    Types: Cigarettes   Smokeless tobacco: Never   Tobacco comments:    quit smoking about 50 years ago  Vaping Use   Vaping Use: Never used  Substance and Sexual Activity   Alcohol use: No   Drug use: No   Sexual activity: Not Currently  Other Topics Concern   Not on file  Social History Narrative   He is married with one daughter. He has 2 grandchildren.   He does not smoke. He quit smoking in roughly 1970 after smoking 3 packs per day.   He is routinely at give him. It does not necessarily do routine exercise.    Social Determinants of Health   Financial Resource Strain: Not on file  Food Insecurity: Not on file  Transportation Needs: Not on file  Physical Activity: Not on file  Stress: Not on file  Social Connections: Not on file  Intimate Partner Violence: Not on file    Review of Systems: General: Negative for fever, chills, fatigue, weakness. Eyes: Negative for vision changes.  ENT: Negative for hoarseness, difficulty  swallowing , nasal congestion. CV: Negative for chest pain, angina, palpitations, dyspnea on exertion, peripheral edema.  Respiratory: Negative for dyspnea at rest, dyspnea on exertion, cough, sputum, wheezing.  GI: See history of present illness. GU:  Negative for dysuria, hematuria, urinary incontinence, urinary frequency, nocturnal urination.  MS: Negative for joint pain, low back pain.  Derm: Negative for rash or itching.  Neuro: Negative for weakness, abnormal sensation, seizure, frequent headaches, memory loss, confusion.  Psych: Negative for anxiety, depression Endo: Negative for unusual weight change.  Heme: Negative for bruising or bleeding. Allergy: Negative for rash or hives.  Physical Exam: Vital signs in last 24 hours: Temp:  [97.6 F (36.4 C)] 97.6 F (36.4 C) (07/05 0834) Pulse Rate:  [61] 61 (07/05 0834) Resp:  [17] 17 (07/05 0834) BP: (153)/(60) 153/60 (07/05 0834) SpO2:  [97 %] 97 % (07/05 0834) Weight:  [56.2 kg] 56.2 kg (07/05 0834)   General:   Alert,  Well-developed, well-nourished, pleasant and cooperative in NAD Head:  Normocephalic and atraumatic. Eyes:  Sclera clear, no icterus.   Conjunctiva pink. Ears:  Normal auditory acuity. Nose:  No deformity, discharge,  or lesions. Mouth:  No deformity or lesions, dentition normal. Neck:  Supple; no masses or thyromegaly. Lungs:  Clear throughout to auscultation.   No wheezes, crackles, or rhonchi. No acute distress. Heart:  Regular rate and rhythm; no murmurs, clicks, rubs,  or gallops. Abdomen:  Soft, nontender and nondistended. No masses, hepatosplenomegaly or hernias noted. Normal bowel sounds, without guarding, and without rebound.   Msk:  Symmetrical without gross deformities. Normal posture. Extremities:  Without clubbing or edema. Neurologic:  Alert and  oriented x4;  grossly normal neurologically. Skin:  Intact without significant lesions or rashes. Cervical Nodes:  No significant cervical  adenopathy. Psych:  Alert and cooperative. Normal mood and affect.   Impression/Plan: KENSON GROH is here for an EGD with possible dilation to be performed for dysphagia, weight loss.   Risks, benefits, limitations, imponderables and alternatives regarding EGD have been reviewed with the  patient. Questions have been answered. All parties agreeable.

## 2021-07-07 NOTE — Anesthesia Postprocedure Evaluation (Signed)
Anesthesia Post Note  Patient: [name]  Procedure(s) Performed: ESOPHAGOGASTRODUODENOSCOPY (EGD) WITH PROPOFOL BALLOON DILATION ESOPHAGEAL BRUSHING  Patient location during evaluation: Phase II Anesthesia Type: General Level of consciousness: awake and alert and oriented Pain management: pain level controlled Vital Signs Assessment: post-procedure vital signs reviewed and stable Respiratory status: spontaneous breathing, nonlabored ventilation and respiratory function stable Cardiovascular status: blood pressure returned to baseline and stable Postop Assessment: no apparent nausea or vomiting Anesthetic complications: no   No notable events documented.   Last Vitals:  Vitals:   07/07/21 0941 07/07/21 0951  BP: (!) 88/34 (!) 120/52  Pulse: (!) 52 (!) 57  Resp:    Temp:    SpO2: 95% 95%    Last Pain:  Vitals:   07/07/21 0937  TempSrc: Oral  PainSc: Asleep                 Konya Fauble C Annet Manukyan

## 2021-07-07 NOTE — Op Note (Signed)
[facility] Patient Name: [name] Procedure Date: 07/07/2021 9:09 AM MRN: [id number] Date of Birth: [birth year] Attending MD: Elon Alas. Abbey Chatters DO CSN: [id number] Age: 88 Admit Type: Outpatient Procedure:                Upper GI endoscopy Indications:              Dysphagia, Weight loss Providers:                Elon Alas. Abbey Chatters, DO, Caprice Kluver, Gwynneth Albright RN, RN, Raphael Gibney, Technician Referring MD:             Elon Alas. Abbey Chatters, DO Medicines:                See the Anesthesia note for documentation of the                            administered medications Complications:            No immediate complications. Estimated Blood Loss:     Estimated blood loss: none. Procedure:                Pre-Anesthesia Assessment:                           - The anesthesia plan was to use monitored                            anesthesia care (MAC).                           After obtaining informed consent, the endoscope was                            passed under direct vision. Throughout the                            procedure, the patient's blood pressure, pulse, and                            oxygen saturations were monitored continuously. The                            GIF-H190 ([id number]) scope was introduced through the                            mouth, and advanced to the second part of duodenum.                            The upper GI endoscopy was accomplished without                            difficulty. The patient tolerated the procedure  well. Scope In: 9:22:59 AM Scope Out: 9:30:01 AM Total Procedure Duration: 0 hours 7 minutes 2 seconds  Findings:      Non-severe esophagitis with no bleeding was found in the upper third of       the esophagus. Cells for cytology were obtained by brushing.      A mild Schatzki ring was found in the distal esophagus. A TTS dilator       was passed through the scope.  Dilation with an 18-19-20 mm balloon       dilator was performed to 20 mm. The dilation site was examined and       showed moderate improvement in luminal narrowing.      The entire examined stomach was normal.      The duodenal bulb, first portion of the duodenum and second portion of       the duodenum were normal.      Food (residue) was found in the second portion of the duodenum. Impression:               - Non-severe candidiasis esophagitis with no                            bleeding. Cells for cytology obtained.                           - Mild Schatzki ring. Dilated.                           - Normal stomach.                           - Normal duodenal bulb, first portion of the                            duodenum and second portion of the duodenum.                           - Retained food in the duodenum. Moderate Sedation:      Per Anesthesia Care Recommendation:           - Patient has a contact number available for                            emergencies. The signs and symptoms of potential                            delayed complications were discussed with the                            patient. Return to normal activities tomorrow.                            Written discharge instructions were provided to the                            patient.                           -  Resume previous diet.                           - Continue present medications.                           - Await pathology results.                           - Repeat upper endoscopy PRN for retreatment.                           - Return to GI clinic in 4 months.                           - Treat for candidal esophagitis if cytology                            positive. Procedure Code(s):        --- Professional ---                           [phone], Esophagogastroduodenoscopy, flexible,                            transoral; with transendoscopic balloon dilation of                            esophagus  (less than 30 mm diameter) Diagnosis Code(s):        --- Professional ---                           B37.81, Candidal esophagitis                           K22.2, Esophageal obstruction                           R13.10, Dysphagia, unspecified                           R63.4, Abnormal weight loss CPT copyright 2019 American Medical Association. All rights reserved. The codes documented in this report are preliminary and upon coder review may  be revised to meet current compliance requirements. Elon Alas. Abbey Chatters, DO [place] Abbey Chatters, DO 07/07/2021 9:32:53 AM This report has been signed electronically. Number of Addenda: 0

## 2021-07-07 NOTE — Anesthesia Procedure Notes (Signed)
Date/Time: 07/07/2021 9:24 AM  Performed by: Orlie Dakin, CRNAPre-anesthesia Checklist: Patient identified, Emergency Drugs available, Suction available and Patient being monitored Patient Re-evaluated:Patient Re-evaluated prior to induction Oxygen Delivery Method: Nasal cannula Induction Type: IV induction Placement Confirmation: positive ETCO2

## 2021-07-07 NOTE — Discharge Instructions (Signed)
EGD Discharge instructions Please read the instructions outlined below and refer to this sheet in the next few weeks. These discharge instructions provide you with general information on caring for yourself after you leave the hospital. Your doctor may also give you specific instructions. While your treatment has been planned according to the most current medical practices available, unavoidable complications occasionally occur. If you have any problems or questions after discharge, please call your doctor. ACTIVITY You may resume your regular activity but move at a slower pace for the next 24 hours.  Take frequent rest periods for the next 24 hours.  Walking will help expel (get rid of) the air and reduce the bloated feeling in your abdomen.  No driving for 24 hours (because of the anesthesia (medicine) used during the test).  You may shower.  Do not sign any important legal documents or operate any machinery for 24 hours (because of the anesthesia used during the test).  NUTRITION Drink plenty of fluids.  You may resume your normal diet.  Begin with a light meal and progress to your normal diet.  Avoid alcoholic beverages for 24 hours or as instructed by your caregiver.  MEDICATIONS You may resume your normal medications unless your caregiver tells you otherwise.  WHAT YOU CAN EXPECT TODAY You may experience abdominal discomfort such as a feeling of fullness or "gas" pains.  FOLLOW-UP Your doctor will discuss the results of your test with you.  SEEK IMMEDIATE MEDICAL ATTENTION IF ANY OF THE FOLLOWING OCCUR: Excessive nausea (feeling sick to your stomach) and/or vomiting.  Severe abdominal pain and distention (swelling).  Trouble swallowing.  Temperature over 101 F (37.8 C).  Rectal bleeding or vomiting of blood.    Your esophagus showed a mild Schatzki's ring.  I dilated this today.  Hopefully this helps  with your swallowing.  You may also have a yeast infection of your esophagus.   I took samples today.  If positive we will treat this.  Your stomach and small bowel appeared normal.  Follow-up with GI in 3 to 4 months.  I hope you have a great rest of your week!  Elon Alas. Abbey Chatters, D.O. Gastroenterology and Hepatology [facility]

## 2021-07-07 NOTE — Transfer of Care (Signed)
Immediate Anesthesia Transfer of Care Note  Patient: [name]  Procedure(s) Performed: ESOPHAGOGASTRODUODENOSCOPY (EGD) WITH PROPOFOL BALLOON DILATION ESOPHAGEAL BRUSHING  Patient Location: Short Stay  Anesthesia Type:General  Level of Consciousness: drowsy  Airway & Oxygen Therapy: Patient Spontanous Breathing  Post-op Assessment: Report given to RN and Post -op Vital signs reviewed and stable  Post vital signs: Reviewed and stable  Last Vitals:  Vitals Value Taken Time  BP    Temp    Pulse    Resp    SpO2      Last Pain:  Vitals:   07/07/21 0917  TempSrc:   PainSc: 0-No pain         Complications: No notable events documented.

## 2021-07-07 NOTE — Anesthesia Preprocedure Evaluation (Signed)
Anesthesia Evaluation  Patient identified by MRN, date of birth, ID band Patient awake    Reviewed: Allergy & Precautions, NPO status , Patient's Chart, lab work & pertinent test results  Airway Mallampati: II  TM Distance: >3 FB Neck ROM: Full    Dental  (+) Edentulous Upper, Edentulous Lower   Pulmonary neg COPD,  COPD inhaler, former smoker,    Pulmonary exam normal breath sounds clear to auscultation       Cardiovascular hypertension, Pt. on medications + angina + CAD, + CABG and + Peripheral Vascular Disease  Normal cardiovascular exam Rhythm:Regular Rate:Normal     Neuro/Psych negative neurological ROS  negative psych ROS   GI/Hepatic Neg liver ROS, GERD  Medicated,  Endo/Other  diabetes, Well Controlled, Type 2, Oral Hypoglycemic Agents  Renal/GU negative Renal ROS  negative genitourinary   Musculoskeletal  (+) Arthritis , Osteoarthritis,    Abdominal   Peds negative pediatric ROS (+)  Hematology negative hematology ROS (+)   Anesthesia Other Findings   Reproductive/Obstetrics negative OB ROS                            Anesthesia Physical Anesthesia Plan  ASA: 3  Anesthesia Plan: General   Post-op Pain Management: Minimal or no pain anticipated   Induction: Intravenous  PONV Risk Score and Plan: Propofol infusion  Airway Management Planned: Nasal Cannula and Natural Airway  Additional Equipment:   Intra-op Plan:   Post-operative Plan:   Informed Consent: I have reviewed the patients History and Physical, chart, labs and discussed the procedure including the risks, benefits and alternatives for the proposed anesthesia with the patient or authorized representative who has indicated his/her understanding and acceptance.       Plan Discussed with: CRNA and Surgeon  Anesthesia Plan Comments:        Anesthesia Quick Evaluation

## 2021-07-13 ENCOUNTER — Encounter (HOSPITAL_COMMUNITY): Payer: Self-pay | Admitting: Internal Medicine

## 2021-07-21 DIAGNOSIS — E162 Hypoglycemia, unspecified: Secondary | ICD-10-CM | POA: Diagnosis not present

## 2021-07-21 DIAGNOSIS — E785 Hyperlipidemia, unspecified: Secondary | ICD-10-CM | POA: Diagnosis not present

## 2021-07-21 DIAGNOSIS — I10 Essential (primary) hypertension: Secondary | ICD-10-CM | POA: Diagnosis not present

## 2021-07-21 LAB — LIPID PANEL
Cholesterol: 108 (ref 0–200)
HDL: 36 (ref 35–70)
LDL Cholesterol: 50
Triglycerides: 125 (ref 40–160)

## 2021-07-26 DIAGNOSIS — R030 Elevated blood-pressure reading, without diagnosis of hypertension: Secondary | ICD-10-CM | POA: Diagnosis not present

## 2021-07-26 DIAGNOSIS — Z6820 Body mass index (BMI) 20.0-20.9, adult: Secondary | ICD-10-CM | POA: Diagnosis not present

## 2021-07-26 DIAGNOSIS — Z87898 Personal history of other specified conditions: Secondary | ICD-10-CM | POA: Diagnosis not present

## 2021-07-26 DIAGNOSIS — Z23 Encounter for immunization: Secondary | ICD-10-CM | POA: Diagnosis not present

## 2021-07-26 DIAGNOSIS — R634 Abnormal weight loss: Secondary | ICD-10-CM | POA: Diagnosis not present

## 2021-07-26 DIAGNOSIS — E1165 Type 2 diabetes mellitus with hyperglycemia: Secondary | ICD-10-CM | POA: Diagnosis not present

## 2021-08-02 DIAGNOSIS — I10 Essential (primary) hypertension: Secondary | ICD-10-CM | POA: Diagnosis not present

## 2021-08-02 DIAGNOSIS — I482 Chronic atrial fibrillation, unspecified: Secondary | ICD-10-CM | POA: Diagnosis not present

## 2021-08-02 DIAGNOSIS — E1165 Type 2 diabetes mellitus with hyperglycemia: Secondary | ICD-10-CM | POA: Diagnosis not present

## 2021-08-02 DIAGNOSIS — Z794 Long term (current) use of insulin: Secondary | ICD-10-CM | POA: Diagnosis not present

## 2021-08-10 DIAGNOSIS — R634 Abnormal weight loss: Secondary | ICD-10-CM | POA: Diagnosis not present

## 2021-08-10 DIAGNOSIS — E1165 Type 2 diabetes mellitus with hyperglycemia: Secondary | ICD-10-CM | POA: Diagnosis not present

## 2021-08-10 DIAGNOSIS — Z6821 Body mass index (BMI) 21.0-21.9, adult: Secondary | ICD-10-CM | POA: Diagnosis not present

## 2021-08-10 DIAGNOSIS — R030 Elevated blood-pressure reading, without diagnosis of hypertension: Secondary | ICD-10-CM | POA: Diagnosis not present

## 2021-08-11 ENCOUNTER — Telehealth: Payer: Self-pay | Admitting: *Deleted

## 2021-08-11 NOTE — Telephone Encounter (Signed)
Dr. Abbey Chatters can you please read the pt's results and send back to me please

## 2021-08-11 NOTE — Telephone Encounter (Signed)
Pt daughter called in and stated they have not heard regarding any results from his procedure on 7/5. They saw PCP today and was told pt needed more testing. Please advise Dr. Abbey Chatters thanks!

## 2021-08-12 ENCOUNTER — Telehealth: Payer: Self-pay | Admitting: Internal Medicine

## 2021-08-12 MED ORDER — FLUCONAZOLE 200 MG PO TABS: ORAL_TABLET | ORAL | 0 refills | Status: DC

## 2021-08-12 NOTE — Telephone Encounter (Signed)
I called and spoke with patient's daughter.  His esophageal cytology did show yeast though it is labeled as bronchial brushing.  I think this is where the mixup was.  I will send in 14-day course of Diflucan to patient's pharmacy.  Thank you

## 2021-08-12 NOTE — Telephone Encounter (Signed)
Phoned and advised the pt that his Rx had been sent to his pharmacy and the directions. Also if his daughter had any questions she can call me tomorrow before lunch because we close early. Pt expressed understanding

## 2021-08-25 ENCOUNTER — Ambulatory Visit: Payer: Medicare Other | Admitting: "Endocrinology

## 2021-08-25 ENCOUNTER — Encounter: Payer: Self-pay | Admitting: "Endocrinology

## 2021-08-25 VITALS — BP 122/52 | HR 60 | Ht 66.0 in | Wt 124.4 lb

## 2021-08-25 DIAGNOSIS — E1165 Type 2 diabetes mellitus with hyperglycemia: Secondary | ICD-10-CM | POA: Diagnosis not present

## 2021-08-25 MED ORDER — EMPAGLIFLOZIN 10 MG PO TABS: 10.0000 mg | ORAL_TABLET | Freq: Every day | ORAL | 1 refills | Status: DC

## 2021-08-25 NOTE — Progress Notes (Signed)
08/25/2021, 5:33 PM  Endocrinology follow-up note   Subjective:    Patient ID: [name], male    DOB: [birth year].  [name] is being seen in follow-up after he was seen in consultation for management of currently uncontrolled symptomatic diabetes requested by  Caryl Bis, MD. He did not show up since May 2021.   Past Medical History:  Diagnosis Date   Arthritis    Asymptomatic stenosis of left vertebral artery 2002, 2005   Status post PTA/stent with redo; normal antegrade flow on dopplers 09/2013   CAD (coronary artery disease) 1995   1CABG x3; redo in CABG x 1 2001 Free Radial to LAD; All grafts patent by Cath 08/2014: The proximal to mid LAD has poor retrograde filling from the LIMA due to in-stent restenosis. 50% ostial disease of free radial artery to the LAD.   CAD (coronary artery disease) of artery bypass graft 2000   PCI x 2 - ostial & prox-mid LAD (BMS) for atretic LIMA-LAD   CAD in native artery 1995   CABG x 3 - LIMA-LAD, SVG-OM2, SVG-rPDA   Chest pain 09/2017   DM (diabetes mellitus) type II controlled peripheral vascular disorder 2002   Left subclavian and left vertebral artery stenoses   GERD (gastroesophageal reflux disease)    Glaucoma    Hyperlipidemia LDL goal <70    Hypertension, essential    S/P CABG x 3 1995   S/P Redo CABG x 1 2001   L Radial-LAD after 2 failed attempts @ LIMA-LAD PTCA; LAD stents 100% occluded   Stenosis of left subclavian artery ([place]) 11/2003   Status post PTA/stent --> < 50 % stenosis by Dopplers 09/2013    Past Surgical History:  Procedure Laterality Date   BALLOON DILATION N/A 07/07/2021   Procedure: BALLOON DILATION;  Surgeon: Eloise Harman, DO;  [facility];  Service: Endoscopy;  Laterality: N/A;   CARDIAC CATHETERIZATION N/A 08/04/2014   Procedure: Left Heart Cath and Cors/Grafts Angiography;  Surgeon: Leonie Man,  MD;  Location: [facility];  Service: Cardiovascular: o-mRCA 70%, m-dRCA ~70% - SVG-dRCA Patent.  o-pLAD 100% stent occluded, mLAD 95% ISR with poor retrograde filling from freeRadiial graft-LAD (50% ostial graft dz).  o-p Cx 80%. Widely patent SVG-Cx-OM fills retrograde to 80% lesion.; Normal LV Fxn & EDP   CORONARY ANGIOPLASTY  April and May 2001   After Both LAD stents occluded - PTCA of anastomatic LIMA-LAD lesion -- Unsuccessful.   CORONARY ARTERY BYPASS GRAFT  1995   LIMA-LAD, SVG-OM2, SVG-rPDA   CORONARY ARTERY BYPASS GRAFT  06/1999   Dr. Lucianne Lei Trigt: Redo LAD grafting with free Left Radial-distal LAD   CORONARY STENT PLACEMENT  1995-2000   2 BMS stents to osital-proximal & proximal-mid LAD; because of atretic LIMA-LDA   ESOPHAGEAL BRUSHING  07/07/2021   Procedure: ESOPHAGEAL BRUSHING;  Surgeon: Eloise Harman, DO;  [facility]: [facility];  Service: Endoscopy;;   ESOPHAGOGASTRODUODENOSCOPY (EGD) WITH PROPOFOL N/A 07/07/2021   Procedure: ESOPHAGOGASTRODUODENOSCOPY (EGD) WITH PROPOFOL;  Surgeon: Eloise Harman, DO;  [facility];  Service: Endoscopy;  Laterality: N/A;  9:15am  LEFT HEART CATH AND CORONARY ANGIOGRAPHY N/A 09/18/2017   Procedure: LEFT HEART CATH AND CORONARY ANGIOGRAPHY;  Surgeon: Martinique, Peter M, MD;  Location: [facility];  Service: Cardiovascular;  Laterality: N/A;   LEFT HEART CATHETERIZATION WITH CORONARY/GRAFT ANGIOGRAM N/A 08/22/2011   Procedure: LEFT HEART CATHETERIZATION WITH Beatrix Fetters;  Surgeon: Leonie Man, MD;  Location: [facility];  Service: Cardiovascular:  Known occluded LIMA-LAD and ostial LAD. Moderate to severe proximal circumflex and RCA disease. Widely patent freeLRAD-dLAD, as well as SVG-RPDA (backfilling RPL), SVG-OM 2 (backfilling OM1)   SHOULDER OPEN ROTATOR CUFF REPAIR  10/2002   Dr. Noemi Chapel   SP [facility] VERT OR THOR CAROTID STENT Left October 2002; November 2005   Left Vertebral stent placed in October  2002 (Dr. Patrecia Pour); redo PCI in 2005   [place] Left 11/2003   Dr. Gwenlyn Found   TRANSTHORACIC ECHOCARDIOGRAM  04/10/2020   [facility]): Normal LV size and function.  EF 60 to 65%.  GR 1 DD.  Mild AI.  Mild to moderate LA dilation.  GR 1 DD.[name]   TRANSTHORACIC ECHOCARDIOGRAM  02/2021   EF 70 to 75%.  Hyperdynamic.  No RWMA.  GR 1 DD.  Normal RV, RVP and low normal RAP.  Normal valves.    Social History   Socioeconomic History   Marital status: Married    Spouse name: Not on file   Number of children: Not on file   Years of education: Not on file   Highest education level: Not on file  Occupational History   Not on file  Tobacco Use   Smoking status: Former    Packs/day: 3.00    Years: 30.00    Total pack years: 90.00    Types: Cigarettes   Smokeless tobacco: Never   Tobacco comments:    quit smoking about 50 years ago  Vaping Use   Vaping Use: Never used  Substance and Sexual Activity   Alcohol use: No   Drug use: No   Sexual activity: Not Currently  Other Topics Concern   Not on file  Social History Narrative   He is married with one daughter. He has 2 grandchildren.   He does not smoke. He quit smoking in roughly 1970 after smoking 3 packs per day.   He is routinely at give him. It does not necessarily do routine exercise.    Social Determinants of Health   Financial Resource Strain: Not on file  Food Insecurity: Not on file  Transportation Needs: Not on file  Physical Activity: Not on file  Stress: Not on file  Social Connections: Not on file    Family History  Problem Relation Age of Onset   Diabetes Mother    Diabetes Sister    Diabetes Brother     Outpatient Encounter Medications as of 08/25/2021  Medication Sig   empagliflozin (JARDIANCE) 10 MG TABS tablet Take 1 tablet (10 mg total) by mouth daily before breakfast.   acetaminophen (TYLENOL) 500 MG tablet Take 1,000 mg by mouth every 6 (six) hours as needed for mild pain or headache.    albuterol (VENTOLIN HFA) 108 (90 Base) MCG/ACT inhaler Inhale 2 puffs into the lungs every 6 (six) hours as needed for wheezing or shortness of breath.   aspirin EC 81 MG tablet Take 81 mg by mouth daily with breakfast.    Brinzolamide-Brimonidine 1-0.2 % SUSP Place 1 drop into the right eye 3 (three) times daily.   Cholecalciferol (VITAMIN D) 2000  units tablet Take 2,000 Units by mouth daily with breakfast.   clopidogrel (PLAVIX) 75 MG tablet Take 75 mg by mouth daily.   doxazosin (CARDURA) 8 MG tablet Take 1 tablet (8 mg total) by mouth at bedtime.   finasteride (PROSCAR) 5 MG tablet Take 5 mg by mouth daily.   fluconazole (DIFLUCAN) 200 MG tablet Take 2 tablets on day 1 then 1 tablet daily thereafter for a total of 14 days.   gabapentin (NEURONTIN) 300 MG capsule Take 600 mg by mouth 3 (three) times daily.   isosorbide mononitrate (IMDUR) 60 MG 24 hr tablet May take 1 and 1/2 tablets to 2 tablets a day depending on if chest discomfort is present (Patient taking differently: Take 120 mg by mouth daily.)   nitroGLYCERIN (NITROSTAT) 0.4 MG SL tablet Place 1 tablet (0.4 mg total) under the tongue every 5 (five) minutes as needed for up to 25 days for chest pain. And increase blood pressure greater than 956 systolic   Omega-3 Fatty Acids (FISH OIL) 1000 MG CAPS Take 1,000 mg by mouth 2 (two) times daily with a meal.    omeprazole (PRILOSEC) 20 MG capsule Take 20 mg by mouth daily.   prednisoLONE acetate (PRED FORTE) 1 % ophthalmic suspension Place 1 drop into the left eye daily.   rosuvastatin (CRESTOR) 40 MG tablet TAKE 1 TABLET BY MOUTH DAILY   TRESIBA FLEXTOUCH 100 UNIT/ML FlexTouch Pen Inject 15 Units into the skin daily.   vitamin C (ASCORBIC ACID) 500 MG tablet Take 500 mg by mouth 2 (two) times daily with a meal.    [DISCONTINUED] glipiZIDE (GLUCOTROL XL) 5 MG 24 hr tablet Take 5 mg by mouth daily.   [DISCONTINUED] JARDIANCE 25 MG TABS tablet Take 25 mg by mouth daily.   No  facility-administered encounter medications on file as of 08/25/2021.    ALLERGIES: Allergies  Allergen Reactions   Iohexol Other (See Comments)    Intractable shaking.   Contrast Media [Iodinated Contrast Media]     Shivering & shaking. Pt reports takes antihistamine prior to procedures.   Metformin And Related Diarrhea    Subsequently discontinued    VACCINATION STATUS: Immunization History  Administered Date(s) Administered   Influenza, High Dose Seasonal PF 09/17/2017   Influenza-Unspecified 09/03/2013    Diabetes He presents for his follow-up diabetic visit. He has type 2 diabetes mellitus. Onset time: He was diagnosed at approximate age of 89 years. His disease course has been fluctuating. Pertinent negatives for hypoglycemia include no confusion, headaches, hunger, nervousness/anxiousness, pallor, seizures or sweats. Pertinent negatives for diabetes include no chest pain, no fatigue, no polydipsia, no polyphagia, no polyuria and no weakness. There are no hypoglycemic complications. Symptoms are improving. Diabetic complications include heart disease. Risk factors for coronary artery disease include dyslipidemia, diabetes mellitus, family history, male sex, hypertension, sedentary lifestyle and tobacco exposure. Current diabetic treatment includes insulin injections (Patient is now on Tresiba 15 units nightly, Jardiance 25 mg p.o. daily and glipizide 5 mg p.o. daily.). His weight is decreasing steadily (Presents with unintentional loss of more than 15 pounds in the last year.). He is following a generally unhealthy diet. When asked about meal planning, he reported none. He has not had a previous visit with a dietitian. He participates in exercise intermittently. His home blood glucose trend is fluctuating minimally. His breakfast blood glucose range is generally 140-180 mg/dl. His lunch blood glucose range is generally 140-180 mg/dl. His dinner blood glucose range is generally 140-180  mg/dl. His  bedtime blood glucose range is generally 140-180 mg/dl. His overall blood glucose range is 140-180 mg/dl. (He presents with a Dexcom CGM showing a 50% in range, 40% level 1 hyperglycemia, 10% level 2 hyperglycemia.  His average blood glucose is 183 for the last 14 days.) Eye exam is current.  Hyperlipidemia This is a chronic problem. The current episode started more than 1 year ago. The problem is controlled. Exacerbating diseases include diabetes. Pertinent negatives include no chest pain, myalgias or shortness of breath. Current antihyperlipidemic treatment includes statins. Risk factors for coronary artery disease include dyslipidemia, diabetes mellitus, hypertension, male sex and a sedentary lifestyle.  Hypertension This is a chronic problem. The current episode started more than 1 year ago. Pertinent negatives include no chest pain, headaches, neck pain, palpitations, shortness of breath or sweats. Risk factors for coronary artery disease include family history, dyslipidemia, male gender, diabetes mellitus, sedentary lifestyle and smoking/tobacco exposure. Past treatments include direct vasodilators and calcium channel blockers. Hypertensive end-organ damage includes CAD/MI.     Review of Systems  Constitutional:  Negative for chills, fatigue, fever and unexpected weight change.  HENT:  Negative for dental problem, mouth sores and trouble swallowing.   Eyes:  Negative for visual disturbance.  Respiratory:  Negative for cough, choking, chest tightness, shortness of breath and wheezing.   Cardiovascular:  Negative for chest pain, palpitations and leg swelling.  Gastrointestinal:  Negative for abdominal distention, abdominal pain, constipation, diarrhea, nausea and vomiting.  Endocrine: Negative for polydipsia, polyphagia and polyuria.  Genitourinary:  Negative for dysuria, flank pain, hematuria and urgency.  Musculoskeletal:  Negative for back pain, gait problem, myalgias and neck  pain.  Skin:  Negative for pallor, rash and wound.  Neurological:  Negative for seizures, syncope, weakness, numbness and headaches.  Psychiatric/Behavioral:  Negative for confusion and dysphoric mood. The patient is not nervous/anxious.     Objective:    BP (!) 122/52   Pulse 60   Ht 5\' 6"  (1.676 m)   Wt 124 lb 6.4 oz (56.4 kg)   BMI 20.08 kg/m   Wt Readings from Last 3 Encounters:  08/25/21 124 lb 6.4 oz (56.4 kg)  07/07/21 124 lb (56.2 kg)  07/05/21 124 lb (56.2 kg)       CMP     Component Value Date/Time   NA 136 07/05/2021 1133   K 4.2 07/05/2021 1133   CL 104 07/05/2021 1133   CO2 27 07/05/2021 1133   GLUCOSE 294 (H) 07/05/2021 1133   BUN 17 07/05/2021 1133   CREATININE 0.92 07/05/2021 1133   CALCIUM 9.4 07/05/2021 1133   PROT 5.7 (L) 02/04/2021 0347   ALBUMIN 3.2 (L) 02/04/2021 0347   AST 24 02/04/2021 0347   ALT 22 02/04/2021 0347   ALKPHOS 32 (L) 02/04/2021 0347   BILITOT 0.4 02/04/2021 0347   GFRNONAA >60 07/05/2021 1133   GFRAA >60 09/19/2017 0420     Diabetic Labs (most recent): Lab Results  Component Value Date   HGBA1C 10.1 06/24/2021   HGBA1C 9.5 (H) 02/04/2021   HGBA1C 7.3 (A) 05/08/2019     Lipid Panel ( most recent) Lipid Panel     Component Value Date/Time   CHOL 108 07/21/2021 0000   TRIG 125 07/21/2021 0000   HDL 36 07/21/2021 0000   CHOLHDL 4.5 09/18/2017 0543   VLDL 22 09/18/2017 0543   LDLCALC 50 07/21/2021 0000      Lab Results  Component Value Date   TSH 5.719 (H) 02/04/2021  TSH 1.466 08/02/2014   FREET4 0.79 02/04/2021    Assessment & Plan:   1. Uncontrolled type 2 diabetes mellitus with hyperglycemia (HCC)  - [name] has currently uncontrolled symptomatic type 2 DM since  88 years of age.  He presents with a Dexcom CGM showing a 50% in range, 40% level 1 hyperglycemia, 10% level 2 hyperglycemia.  His average blood glucose is 183 for the last 14 days.  -his diabetes is complicated by coronary  artery  disease and he remains at a high risk for more acute and chronic complications which include CAD, CVA, CKD, retinopathy, and neuropathy. These are all discussed in detail with him.  Patient is encouraged to switch to  unprocessed or minimally processed     complex starch and increased protein intake of mostly  plant-based, fruits and vegetables.  -  he is advised to stick to a routine mealtimes to eat 3 meals  a day and avoid unnecessary snacks ( to snack only to correct hypoglycemia).   -  Suggestion is made for him to avoid simple carbohydrates  from his diet including Cakes, Sweet Desserts / Pastries, Ice Cream, Soda (diet and regular), Sweet Tea, Candies, Chips, Cookies, Sweet Pastries,  Store Bought Juices, Alcohol in Excess of  1-2 drinks a day, Artificial Sweeteners, Coffee Creamer, and "Sugar-free" Products. This will help patient to have stable blood glucose profile and potentially avoid unintended weight gain.   - he has been  scheduled with Jearld Fenton, RDN, CDE for diabetes education.  - I have approached him with the following individualized plan to manage  his diabetes and patient agrees:   In light of his presentation with unintended weight loss, he is advised to lower his Jardiance to 12.5 mg p.o. daily (half tablet of 25 mg). -#1 priority in this patient will be to avoid hypoglycemia.  He is advised to continue Tresiba 15 units nightly.  He is encouraged to use his CGM continuously.  He is advised to discontinue glipizide at this time.  - he is encouraged to call clinic for blood glucose levels less than 70 or above 200 mg /dl.  - Specific targets for  A1c;  LDL, HDL, Triglycerides,  were discussed with the patient.  2) Blood Pressure /Hypertension:   His blood pressure is controlled to target.  he is advised to continue his current medications including amlodipine 2.5 mg p.o. daily with breakfast .    3) Lipids/Hyperlipidemia:   Review of his recent lipid panel showed   controlled  LDL at 80 .  he  is advised to continue the statin 40 mg p.o. daily at bedtime.   Side effects and precautions discussed with him.  4)  Weight/Diet:  Body mass index is 20.08 kg/m.  -He is not a candidate for weight loss.   Exercise, and detailed carbohydrates information provided  -  detailed on discharge instructions.  5) Chronic Care/Health Maintenance:  -he  is on  Statin medications and  is encouraged to initiate and continue to follow up with Ophthalmology, Dentist,  Podiatrist at least yearly or according to recommendations, and advised to  stay away from smoking. I have recommended yearly flu vaccine and pneumonia vaccine at least every 5 years; moderate intensity exercise for up to 150 minutes weekly; and  sleep for at least 7 hours a day.  - he is  advised to maintain close follow up with Caryl Bis, MD for primary care needs, as well as his other  providers for optimal and coordinated care.  I spent 35 minutes in the care of the patient today including review of labs from [place], Lipids, Thyroid Function, Hematology (current and previous including abstractions from other facilities); face-to-face time discussing  his blood glucose readings/logs, discussing hypoglycemia and hyperglycemia episodes and symptoms, medications doses, his options of short and long term treatment based on the latest standards of care / guidelines;  discussion about incorporating lifestyle medicine;  and documenting the encounter. Risk reduction counseling performed per USPSTF guidelines to reduce cardiovascular risk factors.     Please refer to Patient Instructions for Blood Glucose Monitoring and Insulin/Medications Dosing Guide"  in media tab for additional information. Please  also refer to " Patient Self Inventory" in the Media  tab for reviewed elements of pertinent patient history.  [name] participated in the discussions, expressed understanding, and voiced agreement with the above  plans.  All questions were answered to his satisfaction. he is encouraged to contact clinic should he have any questions or concerns prior to his return visit.   Follow up plan: - Return in about 5 weeks (around 09/29/2021) for Bring Meter/CGM Device/Logs- A1c in Office.  Craig Lloyd, MD [facility] [address] [place], [place] [address] Phone: [phone]  Fax: [phone]    08/25/2021, 5:33 PM  This note was partially dictated with voice recognition software. Similar sounding words can be transcribed inadequately or may not  be corrected upon review.

## 2021-09-09 DIAGNOSIS — E7800 Pure hypercholesterolemia, unspecified: Secondary | ICD-10-CM | POA: Diagnosis not present

## 2021-09-09 DIAGNOSIS — E1165 Type 2 diabetes mellitus with hyperglycemia: Secondary | ICD-10-CM | POA: Diagnosis not present

## 2021-09-09 DIAGNOSIS — Z6820 Body mass index (BMI) 20.0-20.9, adult: Secondary | ICD-10-CM | POA: Diagnosis not present

## 2021-09-09 DIAGNOSIS — R634 Abnormal weight loss: Secondary | ICD-10-CM | POA: Diagnosis not present

## 2021-09-30 ENCOUNTER — Encounter: Payer: Self-pay | Admitting: "Endocrinology

## 2021-09-30 ENCOUNTER — Ambulatory Visit: Payer: Medicare Other | Admitting: "Endocrinology

## 2021-09-30 VITALS — BP 126/64 | HR 60 | Ht 66.0 in | Wt 123.2 lb

## 2021-09-30 DIAGNOSIS — E782 Mixed hyperlipidemia: Secondary | ICD-10-CM

## 2021-09-30 DIAGNOSIS — E1165 Type 2 diabetes mellitus with hyperglycemia: Secondary | ICD-10-CM

## 2021-09-30 DIAGNOSIS — E038 Other specified hypothyroidism: Secondary | ICD-10-CM | POA: Diagnosis not present

## 2021-09-30 LAB — POCT GLYCOSYLATED HEMOGLOBIN (HGB A1C): HbA1c, POC (controlled diabetic range): 7.8 % — AB (ref 0.0–7.0)

## 2021-09-30 NOTE — Patient Instructions (Signed)

## 2021-09-30 NOTE — Progress Notes (Signed)
09/30/2021, 4:08 PM  Endocrinology follow-up note   Subjective:    Patient ID: [name], male    DOB: 07-28-1932.  [name] is being seen in follow-up after he was seen in consultation for management of currently uncontrolled symptomatic diabetes requested by  Caryl Bis, MD. He did not show up since May 2021.   Past Medical History:  Diagnosis Date   Arthritis    Asymptomatic stenosis of left vertebral artery 2002, 2005   Status post PTA/stent with redo; normal antegrade flow on dopplers 09/2013   CAD (coronary artery disease) 1995   1CABG x3; redo in CABG x 1 2001 Free Radial to LAD; All grafts patent by Cath 08/2014: The proximal to mid LAD has poor retrograde filling from the LIMA due to in-stent restenosis. 50% ostial disease of free radial artery to the LAD.   CAD (coronary artery disease) of artery bypass graft 2000   PCI x 2 - ostial & prox-mid LAD (BMS) for atretic LIMA-LAD   CAD in native artery 1995   CABG x 3 - LIMA-LAD, SVG-OM2, SVG-rPDA   Chest pain 09/2017   DM (diabetes mellitus) type II controlled peripheral vascular disorder 2002   Left subclavian and left vertebral artery stenoses   GERD (gastroesophageal reflux disease)    Glaucoma    Hyperlipidemia LDL goal <70    Hypertension, essential    S/P CABG x 3 1995   S/P Redo CABG x 1 2001   L Radial-LAD after 2 failed attempts @ LIMA-LAD PTCA; LAD stents 100% occluded   Stenosis of left subclavian artery ([place]) 11/2003   Status post PTA/stent --> < 50 % stenosis by Dopplers 09/2013    Past Surgical History:  Procedure Laterality Date   BALLOON DILATION N/A 07/07/2021   Procedure: BALLOON DILATION;  Surgeon: Eloise Harman, DO;  [facility];  Service: Endoscopy;  Laterality: N/A;   CARDIAC CATHETERIZATION N/A 08/04/2014   Procedure: Left Heart Cath and Cors/Grafts Angiography;  Surgeon: Leonie Man,  MD;  Location: [place] [facility];  Service: Cardiovascular: o-mRCA 70%, m-dRCA ~70% - SVG-dRCA Patent.  o-pLAD 100% stent occluded, mLAD 95% ISR with poor retrograde filling from freeRadiial graft-LAD (50% ostial graft dz).  o-p Cx 80%. Widely patent SVG-Cx-OM fills retrograde to 80% lesion.; Normal LV Fxn & EDP   CORONARY ANGIOPLASTY  April and May 2001   After Both LAD stents occluded - PTCA of anastomatic LIMA-LAD lesion -- Unsuccessful.   CORONARY ARTERY BYPASS GRAFT  1995   LIMA-LAD, SVG-OM2, SVG-rPDA   CORONARY ARTERY BYPASS GRAFT  06/1999   Dr. Lucianne Lei Trigt: Redo LAD grafting with free Left Radial-distal LAD   CORONARY STENT PLACEMENT  1995-2000   2 BMS stents to osital-proximal & proximal-mid LAD; because of atretic LIMA-LDA   ESOPHAGEAL BRUSHING  07/07/2021   Procedure: ESOPHAGEAL BRUSHING;  Surgeon: Eloise Harman, DO;  [facility];  Service: Endoscopy;;   ESOPHAGOGASTRODUODENOSCOPY (EGD) WITH PROPOFOL N/A 07/07/2021   Procedure: ESOPHAGOGASTRODUODENOSCOPY (EGD) WITH PROPOFOL;  Surgeon: Eloise Harman, DO;  [facility];  Service: Endoscopy;  Laterality: N/A;  9:15am  LEFT HEART CATH AND CORONARY ANGIOGRAPHY N/A 09/18/2017   Procedure: LEFT HEART CATH AND CORONARY ANGIOGRAPHY;  Surgeon: Martinique, Peter M, MD;  Location: [place] [facility];  Service: Cardiovascular;  Laterality: N/A;   LEFT HEART CATHETERIZATION WITH CORONARY/GRAFT ANGIOGRAM N/A 08/22/2011   Procedure: LEFT HEART CATHETERIZATION WITH Beatrix Fetters;  Surgeon: Leonie Man, MD;  Location: [facility];  Service: Cardiovascular:  Known occluded LIMA-LAD and ostial LAD. Moderate to severe proximal circumflex and RCA disease. Widely patent freeLRAD-dLAD, as well as SVG-RPDA (backfilling RPL), SVG-OM 2 (backfilling OM1)   SHOULDER OPEN ROTATOR CUFF REPAIR  10/2002   Dr. Noemi Chapel   [facility] VERT OR THOR CAROTID STENT Left October 2002; November 2005   Left Vertebral stent placed in October  2002 (Dr. Patrecia Pour); redo PCI in 2005   [place] Left 11/2003   Dr. Gwenlyn Found   TRANSTHORACIC ECHOCARDIOGRAM  04/10/2020   [facility]): Normal LV size and function.  EF 60 to 65%.  GR 1 DD.  Mild AI.  Mild to moderate LA dilation.  GR 1 DD.[name]   TRANSTHORACIC ECHOCARDIOGRAM  02/2021   EF 70 to 75%.  Hyperdynamic.  No RWMA.  GR 1 DD.  Normal RV, RVP and low normal RAP.  Normal valves.    Social History   Socioeconomic History   Marital status: Married    Spouse name: Not on file   Number of children: Not on file   Years of education: Not on file   Highest education level: Not on file  Occupational History   Not on file  Tobacco Use   Smoking status: Former    Packs/day: 3.00    Years: 30.00    Total pack years: 90.00    Types: Cigarettes   Smokeless tobacco: Never   Tobacco comments:    quit smoking about 50 years ago  Vaping Use   Vaping Use: Never used  Substance and Sexual Activity   Alcohol use: No   Drug use: No   Sexual activity: Not Currently  Other Topics Concern   Not on file  Social History Narrative   He is married with one daughter. He has 2 grandchildren.   He does not smoke. He quit smoking in roughly 1970 after smoking 3 packs per day.   He is routinely at give him. It does not necessarily do routine exercise.    Social Determinants of Health   Financial Resource Strain: Not on file  Food Insecurity: Not on file  Transportation Needs: Not on file  Physical Activity: Not on file  Stress: Not on file  Social Connections: Not on file    Family History  Problem Relation Age of Onset   Diabetes Mother    Diabetes Sister    Diabetes Brother     Outpatient Encounter Medications as of 09/30/2021  Medication Sig   acetaminophen (TYLENOL) 500 MG tablet Take 1,000 mg by mouth every 6 (six) hours as needed for mild pain or headache.   albuterol (VENTOLIN HFA) 108 (90 Base) MCG/ACT inhaler Inhale 2 puffs into the lungs every 6 (six) hours as  needed for wheezing or shortness of breath.   aspirin EC 81 MG tablet Take 81 mg by mouth daily with breakfast.    Brinzolamide-Brimonidine 1-0.2 % SUSP Place 1 drop into the right eye 3 (three) times daily.   Cholecalciferol (VITAMIN D) 2000 units tablet Take 2,000 Units by mouth daily with breakfast.   clopidogrel (PLAVIX) 75 MG tablet Take 75  mg by mouth daily.   doxazosin (CARDURA) 8 MG tablet Take 1 tablet (8 mg total) by mouth at bedtime.   finasteride (PROSCAR) 5 MG tablet Take 5 mg by mouth daily.   fluconazole (DIFLUCAN) 200 MG tablet Take 2 tablets on day 1 then 1 tablet daily thereafter for a total of 14 days.   gabapentin (NEURONTIN) 300 MG capsule Take 600 mg by mouth 3 (three) times daily.   isosorbide mononitrate (IMDUR) 60 MG 24 hr tablet May take 1 and 1/2 tablets to 2 tablets a day depending on if chest discomfort is present (Patient taking differently: Take 120 mg by mouth daily.)   nitroGLYCERIN (NITROSTAT) 0.4 MG SL tablet Place 1 tablet (0.4 mg total) under the tongue every 5 (five) minutes as needed for up to 25 days for chest pain. And increase blood pressure greater than 132 systolic   Omega-3 Fatty Acids (FISH OIL) 1000 MG CAPS Take 1,000 mg by mouth 2 (two) times daily with a meal.    omeprazole (PRILOSEC) 20 MG capsule Take 20 mg by mouth daily.   prednisoLONE acetate (PRED FORTE) 1 % ophthalmic suspension Place 1 drop into the left eye daily.   rosuvastatin (CRESTOR) 40 MG tablet TAKE 1 TABLET BY MOUTH DAILY   TRESIBA FLEXTOUCH 100 UNIT/ML FlexTouch Pen Inject 12 Units into the skin at bedtime.   vitamin C (ASCORBIC ACID) 500 MG tablet Take 500 mg by mouth 2 (two) times daily with a meal.    [DISCONTINUED] empagliflozin (JARDIANCE) 10 MG TABS tablet Take 1 tablet (10 mg total) by mouth daily before breakfast.   No facility-administered encounter medications on file as of 09/30/2021.    ALLERGIES: Allergies  Allergen Reactions   Iohexol Other (See Comments)     Intractable shaking.   Contrast Media [Iodinated Contrast Media]     Shivering & shaking. Pt reports takes antihistamine prior to procedures.   Metformin And Related Diarrhea    Subsequently discontinued    VACCINATION STATUS: Immunization History  Administered Date(s) Administered   Influenza, High Dose Seasonal PF 09/17/2017   Influenza-Unspecified 09/03/2013    Diabetes He presents for his follow-up diabetic visit. He has type 2 diabetes mellitus. Onset time: He was diagnosed at approximate age of 61 years. His disease course has been stable. Pertinent negatives for hypoglycemia include no confusion, headaches, hunger, nervousness/anxiousness, pallor, seizures or sweats. Pertinent negatives for diabetes include no chest pain, no fatigue, no polydipsia, no polyphagia, no polyuria and no weakness. There are no hypoglycemic complications. Symptoms are improving. Diabetic complications include heart disease. Risk factors for coronary artery disease include dyslipidemia, diabetes mellitus, family history, male sex, hypertension, sedentary lifestyle and tobacco exposure. Current diabetic treatment includes insulin injections (Patient is now on Tresiba 15 units nightly, Jardiance 25 mg p.o. daily and glipizide 5 mg p.o. daily.). His weight is decreasing steadily (Presents with unintentional loss of more than 15 pounds in the last year, he is unable to regain his weight.). He is following a generally unhealthy diet. When asked about meal planning, he reported none. He has not had a previous visit with a dietitian. He participates in exercise intermittently. His home blood glucose trend is fluctuating minimally. His breakfast blood glucose range is generally 140-180 mg/dl. His lunch blood glucose range is generally 140-180 mg/dl. His dinner blood glucose range is generally 140-180 mg/dl. His bedtime blood glucose range is generally 140-180 mg/dl. His overall blood glucose range is 140-180 mg/dl. (He  presents with a Dexcom CGM  showing a 52% in range, 37% level 1 hyperglycemia, 10% level 2 hyperglycemia.  His average blood glucose is 180 for the last 14 days. POC a1c today is 7.8%.) Eye exam is current.  Hyperlipidemia This is a chronic problem. The current episode started more than 1 year ago. The problem is controlled. Exacerbating diseases include diabetes. Pertinent negatives include no chest pain, myalgias or shortness of breath. Current antihyperlipidemic treatment includes statins. Risk factors for coronary artery disease include dyslipidemia, diabetes mellitus, hypertension, male sex and a sedentary lifestyle.  Hypertension This is a chronic problem. The current episode started more than 1 year ago. Pertinent negatives include no chest pain, headaches, neck pain, palpitations, shortness of breath or sweats. Risk factors for coronary artery disease include family history, dyslipidemia, male gender, diabetes mellitus, sedentary lifestyle and smoking/tobacco exposure. Past treatments include direct vasodilators and calcium channel blockers. Hypertensive end-organ damage includes CAD/MI.     Review of Systems  Constitutional:  Negative for chills, fatigue, fever and unexpected weight change.  HENT:  Negative for dental problem, mouth sores and trouble swallowing.   Eyes:  Negative for visual disturbance.  Respiratory:  Negative for cough, choking, chest tightness, shortness of breath and wheezing.   Cardiovascular:  Negative for chest pain, palpitations and leg swelling.  Gastrointestinal:  Negative for abdominal distention, abdominal pain, constipation, diarrhea, nausea and vomiting.  Endocrine: Negative for polydipsia, polyphagia and polyuria.  Genitourinary:  Negative for dysuria, flank pain, hematuria and urgency.  Musculoskeletal:  Negative for back pain, gait problem, myalgias and neck pain.  Skin:  Negative for pallor, rash and wound.  Neurological:  Negative for seizures, syncope,  weakness, numbness and headaches.  Psychiatric/Behavioral:  Negative for confusion and dysphoric mood. The patient is not nervous/anxious.     Objective:    BP 126/64   Pulse 60   Ht 5\' 6"  (1.676 m)   Wt 123 lb 3.2 oz (55.9 kg)   BMI 19.89 kg/m   Wt Readings from Last 3 Encounters:  09/30/21 123 lb 3.2 oz (55.9 kg)  08/25/21 124 lb 6.4 oz (56.4 kg)  07/07/21 124 lb (56.2 kg)       CMP     Component Value Date/Time   NA 136 07/05/2021 1133   K 4.2 07/05/2021 1133   CL 104 07/05/2021 1133   CO2 27 07/05/2021 1133   GLUCOSE 294 (H) 07/05/2021 1133   BUN 17 07/05/2021 1133   CREATININE 0.92 07/05/2021 1133   CALCIUM 9.4 07/05/2021 1133   PROT 5.7 (L) 02/04/2021 0347   ALBUMIN 3.2 (L) 02/04/2021 0347   AST 24 02/04/2021 0347   ALT 22 02/04/2021 0347   ALKPHOS 32 (L) 02/04/2021 0347   BILITOT 0.4 02/04/2021 0347   GFRNONAA >60 07/05/2021 1133   GFRAA >60 09/19/2017 0420     Diabetic Labs (most recent): Lab Results  Component Value Date   HGBA1C 7.8 (A) 09/30/2021   HGBA1C 10.1 06/24/2021   HGBA1C 9.5 (H) 02/04/2021     Lipid Panel ( most recent) Lipid Panel     Component Value Date/Time   CHOL 108 07/21/2021 0000   TRIG 125 07/21/2021 0000   HDL 36 07/21/2021 0000   CHOLHDL 4.5 09/18/2017 0543   VLDL 22 09/18/2017 0543   LDLCALC 50 07/21/2021 0000      Lab Results  Component Value Date   TSH 5.719 (H) 02/04/2021   TSH 1.466 08/02/2014   FREET4 0.79 02/04/2021    Assessment & Plan:  1. Uncontrolled type 2 diabetes mellitus with hyperglycemia (HCC)  - [name] has currently uncontrolled symptomatic type 2 DM since  88 years of age.  He presents with a Dexcom CGM showing a 52% in range, 37% level 1 hyperglycemia, 10% level 2 hyperglycemia.  His average blood glucose is 180 for the last 14 days.  -his diabetes is complicated by coronary  artery disease and he remains at a high risk for more acute and chronic complications which include CAD,  CVA, CKD, retinopathy, and neuropathy. These are all discussed in detail with him.  Patient is encouraged to switch to  unprocessed or minimally processed     complex starch and increased protein intake of mostly  plant-based, fruits and vegetables.  -  he is advised to stick to a routine mealtimes to eat 3 meals  a day and avoid unnecessary snacks ( to snack only to correct hypoglycemia).   -  Suggestion is made for him to avoid simple carbohydrates  from his diet including Cakes, Sweet Desserts / Pastries, Ice Cream, Soda (diet and regular), Sweet Tea, Candies, Chips, Cookies, Sweet Pastries,  Store Bought Juices, Alcohol in Excess of  1-2 drinks a day, Artificial Sweeteners, Coffee Creamer, and "Sugar-free" Products. This will help patient to have stable blood glucose profile and potentially avoid unintended weight gain.   - he has been  scheduled with Jearld Fenton, RDN, CDE for diabetes education.  - I have approached him with the following individualized plan to manage  his diabetes and patient agrees:   In light of his presentation with unintended weight loss, he will be better off without the jardiance. He is advised to d/c jardiance.   -#1 priority in this patient will be to avoid hypoglycemia.  He is advised to increase his [place]  to 12 units nightly.  He is encouraged to use his CGM continuously.  He is advised to discontinue glipizide at this time.  - he is encouraged to call clinic for blood glucose levels less than 70 or above 200 mg /dl.  - Specific targets for  A1c;  LDL, HDL, Triglycerides,  were discussed with the patient.  2) Blood Pressure /Hypertension:   His blood pressure is controlled to target.  he is advised to continue his current medications including amlodipine 2.5 mg p.o. daily with breakfast .    3) Lipids/Hyperlipidemia:   Review of his recent lipid panel showed  controlled  LDL at 80 .  he  is advised to continue the statin 40 mg p.o. daily at bedtime.    Side effects and precautions discussed with him.  4)  Weight/Diet:  Body mass index is 19.89 kg/m.  -He is not a candidate for weight loss.   Exercise, and detailed carbohydrates information provided  -  detailed on discharge instructions.  5) Chronic Care/Health Maintenance:  -he  is on  Statin medications and  is encouraged to initiate and continue to follow up with Ophthalmology, Dentist,  Podiatrist at least yearly or according to recommendations, and advised to  stay away from smoking. I have recommended yearly flu vaccine and pneumonia vaccine at least every 5 years; moderate intensity exercise for up to 150 minutes weekly; and  sleep for at least 7 hours a day.  He was previously noted to have subclinical hypothyroidism. He will need repeat TFTs before his next visit.  - he is  advised to maintain close follow up with Caryl Bis, MD for primary care needs, as  well as his other providers for optimal and coordinated care.    I spent 34 minutes in the care of the patient today including review of labs from [place], Lipids, Thyroid Function, Hematology (current and previous including abstractions from other facilities); face-to-face time discussing  his blood glucose readings/logs, discussing hypoglycemia and hyperglycemia episodes and symptoms, medications doses, his options of short and long term treatment based on the latest standards of care / guidelines;  discussion about incorporating lifestyle medicine;  and documenting the encounter. Risk reduction counseling performed per USPSTF guidelines to reduce cardiovascular risk factors.     Please refer to Patient Instructions for Blood Glucose Monitoring and Insulin/Medications Dosing Guide"  in media tab for additional information. Please  also refer to " Patient Self Inventory" in the Media  tab for reviewed elements of pertinent patient history.  [name] participated in the discussions, expressed understanding, and voiced  agreement with the above plans.  All questions were answered to his satisfaction. he is encouraged to contact clinic should he have any questions or concerns prior to his return visit.    Follow up plan: - Return for F/U with Pre-visit Labs, Meter/CGM/Logs, A1c here.  Glade Lloyd, MD [facility] [address] [place], [place] [address] Phone: [phone]  Fax: [phone]    09/30/2021, 4:08 PM  This note was partially dictated with voice recognition software. Similar sounding words can be transcribed inadequately or may not  be corrected upon review.

## 2021-10-01 DIAGNOSIS — S8002XA Contusion of left knee, initial encounter: Secondary | ICD-10-CM | POA: Diagnosis not present

## 2021-10-01 DIAGNOSIS — Z6820 Body mass index (BMI) 20.0-20.9, adult: Secondary | ICD-10-CM | POA: Diagnosis not present

## 2021-10-04 ENCOUNTER — Telehealth: Payer: Self-pay | Admitting: "Endocrinology

## 2021-10-04 NOTE — Telephone Encounter (Signed)
Discussed with pt, understanding voiced. 

## 2021-10-04 NOTE — Telephone Encounter (Signed)
Patient is calling with high readings. Please Advise   He said he is testing throughout the day not at specifics times.  Fri - 207 when he got up, stayed in the 200's most of the day Sat - 306 when he got up, 186 around 11 am Sun - 186 when he got up, 209 at 11 am

## 2021-10-07 NOTE — Telephone Encounter (Signed)
Pt.notified

## 2021-10-07 NOTE — Telephone Encounter (Signed)
On Wednesday 250,  it was over 200 last night and right now it is 235  He did increase to [place] 20 units qhs.

## 2021-10-21 ENCOUNTER — Other Ambulatory Visit: Payer: Self-pay

## 2021-10-21 ENCOUNTER — Observation Stay (HOSPITAL_COMMUNITY)
Admission: EM | Admit: 2021-10-21 | Discharge: 2021-10-22 | Disposition: A | Discharge status: Home / Self Care | Payer: Medicare Other | Attending: Internal Medicine | Admitting: Internal Medicine

## 2021-10-21 ENCOUNTER — Other Ambulatory Visit (HOSPITAL_COMMUNITY): Payer: Self-pay | Admitting: *Deleted

## 2021-10-21 ENCOUNTER — Encounter (HOSPITAL_COMMUNITY): Payer: Self-pay | Admitting: Emergency Medicine

## 2021-10-21 ENCOUNTER — Observation Stay (HOSPITAL_BASED_OUTPATIENT_CLINIC_OR_DEPARTMENT_OTHER): Payer: Medicare Other

## 2021-10-21 ENCOUNTER — Emergency Department (HOSPITAL_COMMUNITY): Payer: Medicare Other

## 2021-10-21 DIAGNOSIS — R55 Syncope and collapse: Secondary | ICD-10-CM | POA: Diagnosis not present

## 2021-10-21 DIAGNOSIS — E1165 Type 2 diabetes mellitus with hyperglycemia: Secondary | ICD-10-CM

## 2021-10-21 DIAGNOSIS — Z7902 Long term (current) use of antithrombotics/antiplatelets: Secondary | ICD-10-CM | POA: Insufficient documentation

## 2021-10-21 DIAGNOSIS — R531 Weakness: Secondary | ICD-10-CM | POA: Diagnosis not present

## 2021-10-21 DIAGNOSIS — K219 Gastro-esophageal reflux disease without esophagitis: Secondary | ICD-10-CM | POA: Diagnosis not present

## 2021-10-21 DIAGNOSIS — Z7982 Long term (current) use of aspirin: Secondary | ICD-10-CM | POA: Insufficient documentation

## 2021-10-21 DIAGNOSIS — R0789 Other chest pain: Secondary | ICD-10-CM | POA: Diagnosis not present

## 2021-10-21 DIAGNOSIS — E538 Deficiency of other specified B group vitamins: Secondary | ICD-10-CM

## 2021-10-21 DIAGNOSIS — I10 Essential (primary) hypertension: Secondary | ICD-10-CM | POA: Diagnosis not present

## 2021-10-21 DIAGNOSIS — R079 Chest pain, unspecified: Secondary | ICD-10-CM | POA: Diagnosis not present

## 2021-10-21 DIAGNOSIS — I110 Hypertensive heart disease with heart failure: Secondary | ICD-10-CM | POA: Insufficient documentation

## 2021-10-21 DIAGNOSIS — I25708 Atherosclerosis of coronary artery bypass graft(s), unspecified, with other forms of angina pectoris: Secondary | ICD-10-CM

## 2021-10-21 DIAGNOSIS — Z7409 Other reduced mobility: Secondary | ICD-10-CM | POA: Insufficient documentation

## 2021-10-21 DIAGNOSIS — E782 Mixed hyperlipidemia: Secondary | ICD-10-CM | POA: Diagnosis not present

## 2021-10-21 DIAGNOSIS — I25119 Atherosclerotic heart disease of native coronary artery with unspecified angina pectoris: Secondary | ICD-10-CM | POA: Diagnosis present

## 2021-10-21 DIAGNOSIS — R739 Hyperglycemia, unspecified: Secondary | ICD-10-CM | POA: Diagnosis not present

## 2021-10-21 DIAGNOSIS — N400 Enlarged prostate without lower urinary tract symptoms: Secondary | ICD-10-CM

## 2021-10-21 DIAGNOSIS — I5032 Chronic diastolic (congestive) heart failure: Secondary | ICD-10-CM | POA: Diagnosis not present

## 2021-10-21 DIAGNOSIS — Z87891 Personal history of nicotine dependence: Secondary | ICD-10-CM | POA: Insufficient documentation

## 2021-10-21 DIAGNOSIS — R6889 Other general symptoms and signs: Secondary | ICD-10-CM | POA: Diagnosis not present

## 2021-10-21 DIAGNOSIS — R269 Unspecified abnormalities of gait and mobility: Secondary | ICD-10-CM | POA: Insufficient documentation

## 2021-10-21 DIAGNOSIS — Z955 Presence of coronary angioplasty implant and graft: Secondary | ICD-10-CM | POA: Diagnosis not present

## 2021-10-21 DIAGNOSIS — Z951 Presence of aortocoronary bypass graft: Secondary | ICD-10-CM | POA: Insufficient documentation

## 2021-10-21 DIAGNOSIS — Z79899 Other long term (current) drug therapy: Secondary | ICD-10-CM | POA: Insufficient documentation

## 2021-10-21 DIAGNOSIS — Z743 Need for continuous supervision: Secondary | ICD-10-CM | POA: Diagnosis not present

## 2021-10-21 DIAGNOSIS — R2681 Unsteadiness on feet: Secondary | ICD-10-CM | POA: Diagnosis not present

## 2021-10-21 DIAGNOSIS — I25118 Atherosclerotic heart disease of native coronary artery with other forms of angina pectoris: Secondary | ICD-10-CM | POA: Diagnosis present

## 2021-10-21 DIAGNOSIS — I2510 Atherosclerotic heart disease of native coronary artery without angina pectoris: Secondary | ICD-10-CM | POA: Insufficient documentation

## 2021-10-21 DIAGNOSIS — I739 Peripheral vascular disease, unspecified: Secondary | ICD-10-CM | POA: Diagnosis present

## 2021-10-21 DIAGNOSIS — Z794 Long term (current) use of insulin: Secondary | ICD-10-CM

## 2021-10-21 LAB — ECHOCARDIOGRAM COMPLETE
AR max vel: 1.91 cm2
AV Area VTI: 1.78 cm2
AV Area mean vel: 1.87 cm2
AV Mean grad: 7 mmHg
AV Peak grad: 13.5 mmHg
Ao pk vel: 1.84 m/s
Area-P 1/2: 2.91 cm2
Height: 66 in
P 1/2 time: 797 msec
S' Lateral: 2.3 cm
Weight: 1971.19 oz

## 2021-10-21 LAB — TROPONIN I (HIGH SENSITIVITY)
Troponin I (High Sensitivity): 6 ng/L (ref <18)
Troponin I (High Sensitivity): 7 ng/L (ref <18)

## 2021-10-21 LAB — CBC
HCT: 35.6 % — ABNORMAL LOW (ref 39.0–52.0)
Hemoglobin: 12.1 g/dL — ABNORMAL LOW (ref 13.0–17.0)
MCH: 30.8 pg (ref 26.0–34.0)
MCHC: 34 g/dL (ref 30.0–36.0)
MCV: 90.6 fL (ref 80.0–100.0)
Platelets: 151 10*3/uL (ref 150–400)
RBC: 3.93 MIL/uL — ABNORMAL LOW (ref 4.22–5.81)
RDW: 11.8 % (ref 11.5–15.5)
WBC: 4.6 10*3/uL (ref 4.0–10.5)
nRBC: 0 % (ref 0.0–0.2)

## 2021-10-21 LAB — T4, FREE: Free T4: 0.75 ng/dL (ref 0.61–1.12)

## 2021-10-21 LAB — GLUCOSE, CAPILLARY: Glucose-Capillary: 133 mg/dL — ABNORMAL HIGH (ref 70–99)

## 2021-10-21 LAB — BASIC METABOLIC PANEL
Anion gap: 3 — ABNORMAL LOW (ref 5–15)
BUN: 16 mg/dL (ref 8–23)
CO2: 27 mmol/L (ref 22–32)
Calcium: 8.8 mg/dL — ABNORMAL LOW (ref 8.9–10.3)
Chloride: 111 mmol/L (ref 98–111)
Creatinine, Ser: 0.88 mg/dL (ref 0.61–1.24)
GFR, Estimated: >60 mL/min (ref >60)
Glucose, Bld: 192 mg/dL — ABNORMAL HIGH (ref 70–99)
Potassium: 4.1 mmol/L (ref 3.5–5.1)
Sodium: 141 mmol/L (ref 135–145)

## 2021-10-21 LAB — VITAMIN B12: Vitamin B-12: 145 pg/mL — ABNORMAL LOW (ref 180–914)

## 2021-10-21 LAB — URINALYSIS, COMPLETE (UACMP) WITH MICROSCOPIC
Bacteria, UA: NONE SEEN
Bilirubin Urine: NEGATIVE
Glucose, UA: 50 mg/dL — AB
Hgb urine dipstick: NEGATIVE
Ketones, ur: NEGATIVE mg/dL
Leukocytes,Ua: NEGATIVE
Nitrite: NEGATIVE
Protein, ur: NEGATIVE mg/dL
Specific Gravity, Urine: 1.006 (ref 1.005–1.030)
pH: 7 (ref 5.0–8.0)

## 2021-10-21 LAB — MAGNESIUM: Magnesium: 1.9 mg/dL (ref 1.7–2.4)

## 2021-10-21 LAB — CBG MONITORING, ED
Glucose-Capillary: 92 mg/dL (ref 70–99)
Glucose-Capillary: 145 mg/dL — ABNORMAL HIGH (ref 70–99)
Glucose-Capillary: 103 mg/dL — ABNORMAL HIGH (ref 70–99)

## 2021-10-21 LAB — CK: Total CK: 31 U/L — ABNORMAL LOW (ref 49–397)

## 2021-10-21 LAB — FOLATE: Folate: 21.2 ng/mL (ref >5.9)

## 2021-10-21 LAB — PHOSPHORUS: Phosphorus: 2.5 mg/dL (ref 2.5–4.6)

## 2021-10-21 LAB — TSH: TSH: 4.473 u[IU]/mL (ref 0.350–4.500)

## 2021-10-21 MED ORDER — CLOPIDOGREL BISULFATE 75 MG PO TABS
75.0000 mg | ORAL_TABLET | Freq: Every day | ORAL | Status: DC
  Administered 2021-10-21 – 2021-10-22 (×2): 75 mg via ORAL
  Filled 2021-10-21 (×2): qty 1

## 2021-10-21 MED ORDER — ISOSORBIDE MONONITRATE ER 60 MG PO TB24: 120.0000 mg | ORAL_TABLET | Freq: Every day | ORAL | Status: DC

## 2021-10-21 MED ORDER — INSULIN ASPART 100 UNIT/ML IJ SOLN: 0.0000 [IU] | Freq: Every day | INTRAMUSCULAR | Status: DC

## 2021-10-21 MED ORDER — NITROGLYCERIN 0.4 MG SL SUBL: 0.4000 mg | SUBLINGUAL_TABLET | SUBLINGUAL | Status: DC | PRN

## 2021-10-21 MED ORDER — DOXAZOSIN MESYLATE 2 MG PO TABS
8.0000 mg | ORAL_TABLET | Freq: Every day | ORAL | Status: DC
  Administered 2021-10-21: 8 mg via ORAL
  Filled 2021-10-21: qty 4

## 2021-10-21 MED ORDER — INSULIN ASPART 100 UNIT/ML IJ SOLN
0.0000 [IU] | Freq: Three times a day (TID) | INTRAMUSCULAR | Status: DC
  Administered 2021-10-21: 1 [IU] via SUBCUTANEOUS
  Filled 2021-10-21: qty 1

## 2021-10-21 MED ORDER — PANTOPRAZOLE SODIUM 40 MG PO TBEC
40.0000 mg | DELAYED_RELEASE_TABLET | Freq: Every day | ORAL | Status: DC
  Administered 2021-10-21 – 2021-10-22 (×2): 40 mg via ORAL
  Filled 2021-10-21 (×2): qty 1

## 2021-10-21 MED ORDER — ASPIRIN 81 MG PO CHEW
81.0000 mg | CHEWABLE_TABLET | Freq: Every day | ORAL | Status: DC
  Administered 2021-10-21 – 2021-10-22 (×2): 81 mg via ORAL
  Filled 2021-10-21 (×2): qty 1

## 2021-10-21 MED ORDER — ROSUVASTATIN CALCIUM 20 MG PO TABS: 40.0000 mg | ORAL_TABLET | Freq: Every day | ORAL | Status: DC

## 2021-10-21 MED ORDER — ALBUTEROL SULFATE HFA 108 (90 BASE) MCG/ACT IN AERS: 2.0000 | INHALATION_SPRAY | Freq: Four times a day (QID) | RESPIRATORY_TRACT | Status: DC | PRN

## 2021-10-21 MED ORDER — INSULIN GLARGINE-YFGN 100 UNIT/ML ~~LOC~~ SOLN
5.0000 [IU] | Freq: Every day | SUBCUTANEOUS | Status: DC
  Filled 2021-10-21: qty 0.05

## 2021-10-21 MED ORDER — GABAPENTIN 300 MG PO CAPS
600.0000 mg | ORAL_CAPSULE | Freq: Three times a day (TID) | ORAL | Status: DC
  Administered 2021-10-21 – 2021-10-22 (×2): 600 mg via ORAL
  Filled 2021-10-21 (×2): qty 2

## 2021-10-21 MED ORDER — CLOPIDOGREL BISULFATE 75 MG PO TABS: 75.0000 mg | ORAL_TABLET | Freq: Every day | ORAL | Status: DC

## 2021-10-21 MED ORDER — ROSUVASTATIN CALCIUM 20 MG PO TABS
40.0000 mg | ORAL_TABLET | Freq: Every day | ORAL | Status: DC
  Administered 2021-10-21: 40 mg via ORAL
  Filled 2021-10-21: qty 2

## 2021-10-21 MED ORDER — LACTATED RINGERS IV SOLN: INTRAVENOUS | Status: AC

## 2021-10-21 MED ORDER — CYANOCOBALAMIN 1000 MCG/ML IJ SOLN
1000.0000 ug | Freq: Once | INTRAMUSCULAR | Status: AC
  Administered 2021-10-21: 1000 ug via INTRAMUSCULAR
  Filled 2021-10-21: qty 1

## 2021-10-21 MED ORDER — FINASTERIDE 5 MG PO TABS
5.0000 mg | ORAL_TABLET | Freq: Every day | ORAL | Status: DC
  Administered 2021-10-21 – 2021-10-22 (×2): 5 mg via ORAL
  Filled 2021-10-21 (×2): qty 1

## 2021-10-21 MED ORDER — GLUCERNA SHAKE PO LIQD
237.0000 mL | Freq: Three times a day (TID) | ORAL | Status: DC
  Administered 2021-10-21: 237 mL via ORAL
  Filled 2021-10-21 (×8): qty 237

## 2021-10-21 MED ORDER — AMLODIPINE BESYLATE 5 MG PO TABS
5.0000 mg | ORAL_TABLET | Freq: Every day | ORAL | Status: DC
  Administered 2021-10-21 – 2021-10-22 (×2): 5 mg via ORAL
  Filled 2021-10-21 (×2): qty 1

## 2021-10-21 MED ORDER — HYDRALAZINE HCL 20 MG/ML IJ SOLN
10.0000 mg | Freq: Four times a day (QID) | INTRAMUSCULAR | Status: DC | PRN
  Administered 2021-10-21: 10 mg via INTRAVENOUS
  Filled 2021-10-21: qty 1

## 2021-10-21 MED ORDER — INSULIN GLARGINE-YFGN 100 UNIT/ML ~~LOC~~ SOLN
5.0000 [IU] | Freq: Every day | SUBCUTANEOUS | Status: DC
  Filled 2021-10-21 (×2): qty 0.05

## 2021-10-21 MED ORDER — OMEGA-3-ACID ETHYL ESTERS 1 G PO CAPS
1.0000 g | ORAL_CAPSULE | Freq: Every day | ORAL | Status: DC
  Administered 2021-10-21 – 2021-10-22 (×2): 1 g via ORAL
  Filled 2021-10-21 (×2): qty 1

## 2021-10-21 MED ORDER — INFLUENZA VAC A&B SA ADJ QUAD 0.5 ML IM PRSY: 0.5000 mL | PREFILLED_SYRINGE | INTRAMUSCULAR | Status: DC

## 2021-10-21 MED ORDER — VITAMIN B-12 100 MCG PO TABS
500.0000 ug | ORAL_TABLET | Freq: Every day | ORAL | Status: DC
  Administered 2021-10-22: 500 ug via ORAL
  Filled 2021-10-21: qty 5

## 2021-10-21 MED ORDER — ASPIRIN 81 MG PO TBEC: 81.0000 mg | DELAYED_RELEASE_TABLET | Freq: Every day | ORAL | Status: DC

## 2021-10-21 MED ORDER — ISOSORBIDE MONONITRATE ER 60 MG PO TB24
60.0000 mg | ORAL_TABLET | Freq: Every day | ORAL | Status: DC
  Administered 2021-10-21 – 2021-10-22 (×2): 60 mg via ORAL
  Filled 2021-10-21 (×2): qty 1

## 2021-10-21 MED ORDER — RANOLAZINE ER 500 MG PO TB12
500.0000 mg | ORAL_TABLET | Freq: Two times a day (BID) | ORAL | Status: DC
  Administered 2021-10-21 – 2021-10-22 (×2): 500 mg via ORAL
  Filled 2021-10-21 (×2): qty 1

## 2021-10-21 NOTE — Assessment & Plan Note (Addendum)
Status post left vertebral stent by Dr. Gwenlyn Found 2005.  Previously done in 2002.

## 2021-10-21 NOTE — Progress Notes (Signed)
*  PRELIMINARY RESULTS* Echocardiogram 2D Echocardiogram has been performed.  [name] 10/21/2021, 3:08 PM

## 2021-10-21 NOTE — Evaluation (Signed)
Occupational Therapy Evaluation Patient Details Name: [name] MRN: [id number] DOB: Apr 03, 1932 Today's Date: 10/21/2021   History of Present Illness [name] is a 88 y.o. male with medical history significant of CAD s/p CABG x 3, T2DM, hypertension, hyperlipidemia, chronic pain syndrome who presents to the emergency department via EMS from home due to sudden onset of generalized weakness which occurred shortly prior to arrival to the ED. patient states that he got up to use the restroom and then felt sharp pain in the midsternal/left-sided chest area, chest pain was 5-6/10 on pain scale and it was associated with chest tightness and weakness.  Chest pain was nonradiating and it has both reproducible and not reproducible components.  There was no known aggravating factors, but there was slight alleviation of pain with aspirin and nitroglycerin given by EMS team.  Patient states that he was so weak that he had to lower himself to the floor to avoid falling.  He complained of dizziness and shortness of breath, but denies diaphoresis, nausea, vomiting, abdominal pain. (per DO)   Clinical Impression   Pt agreeable to OT and PT co-evaluation. Pt demonstrates good bed mobility and seated balance. Good ability to dress LE while seated at EOB. Pt primary deficit is balance deficits in standing. Min G A needed during ambulation without RW. With RW pt at level of supervision. Deferring to PT to address balance deficits due to pt's good B UE strength and ability to complete seated ADL's well and transfer with supervision if using RW. Pt left in bed with family present. Pt is not recommended for further acute OT services and will be discharged to care of nursing staff for remaining length of stay.      Recommendations for follow up therapy are one component of a multi-disciplinary discharge planning process, led by the attending physician.  Recommendations may be updated based on patient status,  additional functional criteria and insurance authorization.   Follow Up Recommendations  No OT follow up    Assistance Recommended at Discharge Set up Supervision/Assistance  Patient can return home with the following A little help with walking and/or transfers;Help with stairs or ramp for entrance;Assist for transportation    Functional Status Assessment  Patient has had a recent decline in their functional status and demonstrates the ability to make significant improvements in function in a reasonable and predictable amount of time. (Defering to PT for mobility.)  Equipment Recommendations  None recommended by OT    Recommendations for Other Services       Precautions / Restrictions Precautions Precautions: Fall Restrictions Weight Bearing Restrictions: No      Mobility Bed Mobility Overal bed mobility: Independent                  Transfers Overall transfer level: Needs assistance Equipment used: Rolling walker (2 wheels) Transfers: Sit to/from Stand, Bed to chair/wheelchair/BSC Sit to Stand: Supervision, Min guard     Step pivot transfers: Supervision, Min guard     General transfer comment: More supervision for step pivot and ambulation if using RW. Min G without.      Balance Overall balance assessment: Needs assistance Sitting-balance support: No upper extremity supported, Feet supported Sitting balance-Leahy Scale: Good Sitting balance - Comments: seated EOB   Standing balance support: No upper extremity supported, During functional activity Standing balance-Leahy Scale: Fair Standing balance comment: poor to fair without RW; fair to good with RW  ADL either performed or assessed with clinical judgement   ADL Overall ADL's : Needs assistance/impaired     Grooming: Supervision/safety;Min guard;Standing       Lower Body Bathing: Set up;Sitting/lateral leans       Lower Body Dressing: Modified  independent;Sitting/lateral leans Lower Body Dressing Details (indicate cue type and reason): Able to doff and don sock without difficulty while seated at EOB. Toilet Transfer: Supervision/safety;Rolling walker (2 wheels);Ambulation Toilet Transfer Details (indicate cue type and reason): Simulated ambulation in [place] and return to bed. RW used intermittently. Min G without RW; supervision with RW.         Functional mobility during ADLs: Supervision/safety;Min guard;Rolling walker (2 wheels)       Vision Baseline Vision/History:  (Pt reports L eye cornea issues making eye very limited.) Ability to See in Adequate Light: 2 Moderately impaired Patient Visual Report: No change from baseline Vision Assessment?: No apparent visual deficits (Other than baseline. Pt donnign sunglasses during session due to report that light bothers his eyes.)     Perception     Praxis      Pertinent Vitals/Pain Pain Assessment Pain Assessment: 0-10 Pain Score: 3  Pain Location: chest Pain Descriptors / Indicators: Pressure, Sharp Pain Intervention(s): Limited activity within patient's tolerance, Monitored during session, Repositioned     Hand Dominance Right   Extremity/Trunk Assessment Upper Extremity Assessment Upper Extremity Assessment: Overall WFL for tasks assessed   Lower Extremity Assessment Lower Extremity Assessment: Defer to PT evaluation   Cervical / Trunk Assessment Cervical / Trunk Assessment: Normal   Communication Communication Communication: No difficulties   Cognition Arousal/Alertness: Awake/alert Behavior During Therapy: WFL for tasks assessed/performed Overall Cognitive Status: Within Functional Limits for tasks assessed                                       General Comments       Exercises     Shoulder Instructions      Home Living Family/patient expects to be discharged to:: Private residence Living Arrangements: Children Available Help at  Discharge: Family;Available 24 hours/day Type of Home: Other(Comment) (double wide) Home Access: Ramped entrance;Stairs to enter Entrance Stairs-Number of Steps: 1 Entrance Stairs-Rails: None Home Layout: One level     Bathroom Shower/Tub: Occupational psychologist: Standard Bathroom Accessibility: Yes How Accessible: Accessible via walker Home Equipment: Rolling Walker (2 wheels);Cane - single point;Grab bars - tub/shower;Shower seat;BSC/3in1   Additional Comments: Pt reported no change in home living situation since admission less than a year ago.      Prior Functioning/Environment Prior Level of Function : Needs assist       Physical Assist : ADLs (physical)   ADLs (physical): IADLs Mobility Comments: Community ambulator without AD, does not drive\ ADLs Comments: Independent ADL; able to cook and clean; daughter assist with getting groceries.        OT Problem List:        OT Treatment/Interventions:      OT Goals(Current goals can be found in the care plan section)    OT Frequency:      Co-evaluation PT/OT/SLP Co-Evaluation/Treatment: Yes Reason for Co-Treatment: To address functional/ADL transfers   OT goals addressed during session: ADL's and self-care      AM-PAC OT "6 Clicks" Daily Activity     Outcome Measure Help from another person eating meals?: None Help from another person taking care  of personal grooming?: A Little Help from another person toileting, which includes using toliet, bedpan, or urinal?: None Help from another person bathing (including washing, rinsing, drying)?: None Help from another person to put on and taking off regular upper body clothing?: None Help from another person to put on and taking off regular lower body clothing?: None 6 Click Score: 23   End of Session Equipment Utilized During Treatment: Rolling walker (2 wheels)  Activity Tolerance: Patient tolerated treatment well Patient left: in bed;with call bell/phone  within reach;with family/visitor present  OT Visit Diagnosis: Unsteadiness on feet (R26.81);Other abnormalities of gait and mobility (R26.89)                Time: 2787-1836 OT Time Calculation (min): 18 min Charges:  OT General Charges $OT Visit: 1 Visit OT Evaluation $OT Eval Low Complexity: 1 Low  Ameah Chanda OT, MOT  Larey Seat 10/21/2021, 9:28 AM

## 2021-10-21 NOTE — Assessment & Plan Note (Addendum)
The patient has a component of chest wall pain Patient describes chronic chest pain for which he takes Imdur 90 mg twice daily Cardiology consult appreciated>>start ranexa 500 bid, decrease imdur 60 mg bid -Echo EF 60-65%, no WMA, normal RVF, mild MR

## 2021-10-21 NOTE — Assessment & Plan Note (Signed)
Continue PPI ?

## 2021-10-21 NOTE — Assessment & Plan Note (Signed)
09/30/2021 hemoglobin A1c 7.8 No longer taking Jardiance or Trulicity NovoLog sliding scale Start reduced dose Kellogg

## 2021-10-21 NOTE — ED Provider Notes (Signed)
[facility] EMERGENCY DEPARTMENT Provider Note   CSN: [id number] Arrival date & time: 10/21/21  0235     History  Chief Complaint  Patient presents with   Weakness    [name] is a 88 y.o. male.  Patient is an 88 year old male with extensive past medical history including coronary artery disease status post CABG twice in the past, hypertension, diabetes, peripheral artery disease.  Patient presenting today with complaints of weakness.  He reports getting up in the night to go to the bathroom.  He states he took a few steps, then everything went "haywire".  He describes feeling very weak, tight in the chest, headache, then had to lower himself to the floor because he was too weak to stand.  He reports feeling short of breath and dizzy.  He denies any recent illness.  The history is provided by the patient.  Weakness Severity:  Moderate Onset quality:  Sudden Timing:  Constant      Home Medications Prior to Admission medications   Medication Sig Start Date End Date Taking? Authorizing Provider  acetaminophen (TYLENOL) 500 MG tablet Take 1,000 mg by mouth every 6 (six) hours as needed for mild pain or headache.    [provider]  albuterol (VENTOLIN HFA) 108 (90 Base) MCG/ACT inhaler Inhale 2 puffs into the lungs every 6 (six) hours as needed for wheezing or shortness of breath. 03/04/20   [provider]  aspirin EC 81 MG tablet Take 81 mg by mouth daily with breakfast.     [provider]  Brinzolamide-Brimonidine 1-0.2 % SUSP Place 1 drop into the right eye 3 (three) times daily.    [provider]  Cholecalciferol (VITAMIN D) 2000 units tablet Take 2,000 Units by mouth daily with breakfast.    [provider]  clopidogrel (PLAVIX) 75 MG tablet Take 75 mg by mouth daily.    [provider]  doxazosin (CARDURA) 8 MG tablet Take 1 tablet (8 mg total) by mouth at bedtime. 02/05/21   Barton Dubois, MD  finasteride (PROSCAR)  5 MG tablet Take 5 mg by mouth daily. 01/15/21   [provider]  fluconazole (DIFLUCAN) 200 MG tablet Take 2 tablets on day 1 then 1 tablet daily thereafter for a total of 14 days. 08/12/21   Eloise Harman, DO  gabapentin (NEURONTIN) 300 MG capsule Take 600 mg by mouth 3 (three) times daily.    [provider]  isosorbide mononitrate (IMDUR) 60 MG 24 hr tablet May take 1 and 1/2 tablets to 2 tablets a day depending on if chest discomfort is present Patient taking differently: Take 120 mg by mouth daily. 11/21/16   Leonie Man, MD  nitroGLYCERIN (NITROSTAT) 0.4 MG SL tablet Place 1 tablet (0.4 mg total) under the tongue every 5 (five) minutes as needed for up to 25 days for chest pain. And increase blood pressure greater than 859 systolic 2/92/44 06/30/61  Leonie Man, MD  Omega-3 Fatty Acids (FISH OIL) 1000 MG CAPS Take 1,000 mg by mouth 2 (two) times daily with a meal.     [provider]  omeprazole (PRILOSEC) 20 MG capsule Take 20 mg by mouth daily. 09/06/19   [provider]  prednisoLONE acetate (PRED FORTE) 1 % ophthalmic suspension Place 1 drop into the left eye daily.    [provider]  rosuvastatin (CRESTOR) 40 MG tablet TAKE 1 TABLET BY MOUTH DAILY 03/26/21   Leonie Man, MD  [facility]  100 UNIT/ML FlexTouch Pen Inject 12 Units into the skin at bedtime. 02/16/21   [provider]  vitamin C (ASCORBIC ACID) 500 MG tablet Take 500 mg by mouth 2 (two) times daily with a meal.     [provider]      Allergies    Iohexol, Contrast media [iodinated contrast media], and Metformin and related    Review of Systems   Review of Systems  Neurological:  Positive for weakness.  All other systems reviewed and are negative.   Physical Exam Updated Vital Signs BP (!) 171/56   Pulse (!) 59   Temp 97.6 F (36.4 C) (Oral)   Resp 18   Ht 5\' 6"  (1.676 m)   Wt 55.9 kg   SpO2 95%   BMI 19.88 kg/m  Physical  Exam Vitals and nursing note reviewed.  Constitutional:      General: He is not in acute distress.    Appearance: He is well-developed. He is not diaphoretic.  HENT:     Head: Normocephalic and atraumatic.  Eyes:     Extraocular Movements: Extraocular movements intact.     Pupils: Pupils are equal, round, and reactive to light.  Cardiovascular:     Rate and Rhythm: Normal rate and regular rhythm.     Heart sounds: No murmur heard.    No friction rub.  Pulmonary:     Effort: Pulmonary effort is normal. No respiratory distress.     Breath sounds: Normal breath sounds. No wheezing or rales.  Abdominal:     General: Bowel sounds are normal. There is no distension.     Palpations: Abdomen is soft.     Tenderness: There is no abdominal tenderness.  Musculoskeletal:        General: Normal range of motion.     Cervical back: Normal range of motion and neck supple.  Skin:    General: Skin is warm and dry.  Neurological:     General: No focal deficit present.     Mental Status: He is alert and oriented to person, place, and time.     Cranial Nerves: No cranial nerve deficit.     Motor: No weakness.     Coordination: Coordination normal.     ED Results / Procedures / Treatments   Labs (all labs ordered are listed, but only abnormal results are displayed) Labs Reviewed  BASIC METABOLIC PANEL  CBC  TROPONIN I (HIGH SENSITIVITY)    EKG EKG Interpretation  Date/Time:  Thursday October 21 2021 02:45:22 EDT Ventricular Rate:  64 PR Interval:  190 QRS Duration: 87 QT Interval:  417 QTC Calculation: 431 R Axis:   30 Text Interpretation: Sinus rhythm Ventricular premature complex RSR' in V1 or V2, right VCD or RVH Baseline wander in lead(s) V2 No significant change since 07/05/2021 Confirmed by Veryl Speak [phone]) on 10/21/2021 2:54:17 AM  Radiology No results found.  Procedures Procedures    Medications Ordered in ED Medications - No data to display  ED Course/ Medical  Decision Making/ A&P  Patient is an 88 year old male with extensive past medical and cardiac history as described in the HPI.  He presents with sudden onset of chest pain, shortness of breath, and weakness while attempting to ambulate to the bathroom.  Patient arrives here with stable vital signs and no hypoxia.  On exam, heart is regular rate and rhythm and lungs are clear.  There is no swelling of the lower extremities and physical examination is otherwise unremarkable.  Work-up initiated including EKG, laboratory studies including CBC, metabolic panel, and troponin.  Laboratory studies have returned and are essentially unremarkable.  EKG shows no acute changes from prior studies.  Portable chest x-ray shows no acute disease.  Due to the patient's extensive cardiac history including bypass surgery x2, I feel as though admission is appropriate.  I have discussed care with Dr. Josephine Cables who will evaluate and likely admit.  Final Clinical Impression(s) / ED Diagnoses Final diagnoses:  None    Rx / DC Orders ED Discharge Orders     None         Veryl Speak, MD 10/22/21 [phone]

## 2021-10-21 NOTE — ED Triage Notes (Signed)
Pt BIB [facility] from home after weakness episode tonight while walking to the bathroom. Pt then c/o left sided chest pain while being loaded by EMS. Pt given 324 ASA and 1 nitro.  18G IV L AC

## 2021-10-21 NOTE — Plan of Care (Signed)
  Problem: Acute Rehab PT Goals(only PT should resolve) Goal: Pt Will Go Supine/Side To Sit Outcome: Progressing Flowsheets (Taken 10/21/2021 1228) Pt will go Supine/Side to Sit: Independently Goal: Patient Will Transfer Sit To/From Stand Outcome: Progressing Flowsheets (Taken 10/21/2021 1228) Patient will transfer sit to/from stand: with modified independence Goal: Pt Will Transfer Bed To Chair/Chair To Bed Outcome: Progressing Flowsheets (Taken 10/21/2021 1228) Pt will Transfer Bed to Chair/Chair to Bed: with modified independence Goal: Pt Will Ambulate Outcome: Progressing Flowsheets (Taken 10/21/2021 1228) Pt will Ambulate:  with modified independence  > 125 feet  with rolling walker   Zigmund Gottron, SPT

## 2021-10-21 NOTE — Assessment & Plan Note (Addendum)
Suspect the patient had some orthostasis resulting in his presentation -Check orthostatics positive with drop 164/57>>144/55 -judicious IV fluids>>clinically improved -Monitor on telemetry--no concerning dysrhythmia -Echo EF 60-65%, no WMA, normal RVF, mild MR

## 2021-10-21 NOTE — H&P (Signed)
History and Physical    Patient: [name] JAS:[id number] DOB: 1932-06-08 DOA: 10/21/2021 DOS: the patient was seen and examined on 10/21/2021 PCP: [facility]  Patient coming from: Home  Chief Complaint:  Chief Complaint  Patient presents with   Weakness   HPI: [name] is a 88 y.o. male with medical history significant of CAD s/p CABG x 3, T2DM, hypertension, hyperlipidemia, chronic pain syndrome who presents to the emergency department via EMS from home due to sudden onset of generalized weakness which occurred shortly prior to arrival to the ED. patient states that he got up to use the restroom and then felt sharp pain in the midsternal/left-sided chest area, chest pain was 5-6/10 on pain scale and it was associated with chest tightness and weakness.  Chest pain was nonradiating and it has both reproducible and not reproducible components.  There was no known aggravating factors, but there was slight alleviation of pain with aspirin and nitroglycerin given by EMS team.  Patient states that he was so weak that he had to lower himself to the floor to avoid falling.  He complained of dizziness and shortness of breath, but denies diaphoresis, nausea, vomiting, abdominal pain.  ED Course:  In the emergency department, BP was 173/53, he was bradycardic with HR in the high 50s, but other vital signs were within normal range.  Work-up in the ED showed normocytic anemia and normal BMP except for hyperglycemia. Chest x-ray showed no active disease Hospitalist was asked to admit patient for further evaluation and management.  Review of Systems: Review of systems as noted in the HPI. All other systems reviewed and are negative.   Past Medical History:  Diagnosis Date   Arthritis    Asymptomatic stenosis of left vertebral artery 2002, 2005   Status post PTA/stent with redo; normal antegrade flow on dopplers 09/2013   CAD (coronary artery disease) 1995   1CABG x3; redo  in CABG x 1 2001 Free Radial to LAD; All grafts patent by Cath 08/2014: The proximal to mid LAD has poor retrograde filling from the LIMA due to in-stent restenosis. 50% ostial disease of free radial artery to the LAD.   CAD (coronary artery disease) of artery bypass graft 2000   PCI x 2 - ostial & prox-mid LAD (BMS) for atretic LIMA-LAD   CAD in native artery 1995   CABG x 3 - LIMA-LAD, SVG-OM2, SVG-rPDA   Chest pain 09/2017   DM (diabetes mellitus) type II controlled peripheral vascular disorder 2002   Left subclavian and left vertebral artery stenoses   GERD (gastroesophageal reflux disease)    Glaucoma    Hyperlipidemia LDL goal <70    Hypertension, essential    S/P CABG x 3 1995   S/P Redo CABG x 1 2001   L Radial-LAD after 2 failed attempts @ LIMA-LAD PTCA; LAD stents 100% occluded   Stenosis of left subclavian artery ([place]) 11/2003   Status post PTA/stent --> < 50 % stenosis by Dopplers 09/2013   Past Surgical History:  Procedure Laterality Date   BALLOON DILATION N/A 07/07/2021   Procedure: BALLOON DILATION;  Surgeon: Eloise Harman, DO;  [facility];  Service: Endoscopy;  Laterality: N/A;   CARDIAC CATHETERIZATION N/A 08/04/2014   Procedure: Left Heart Cath and Cors/Grafts Angiography;  Surgeon: Leonie Man, MD;  Location: [place] [facility];  Service: Cardiovascular: o-mRCA 70%, m-dRCA ~70% - SVG-dRCA Patent.  o-pLAD 100% stent occluded, mLAD 95% ISR with poor  retrograde filling from freeRadiial graft-LAD (50% ostial graft dz).  o-p Cx 80%. Widely patent SVG-Cx-OM fills retrograde to 80% lesion.; Normal LV Fxn & EDP   CORONARY ANGIOPLASTY  April and May 2001   After Both LAD stents occluded - PTCA of anastomatic LIMA-LAD lesion -- Unsuccessful.   CORONARY ARTERY BYPASS GRAFT  1995   LIMA-LAD, SVG-OM2, SVG-rPDA   CORONARY ARTERY BYPASS GRAFT  06/1999   Dr. Lucianne Lei Trigt: Redo LAD grafting with free Left Radial-distal LAD   CORONARY STENT PLACEMENT  1995-2000   2 BMS  stents to osital-proximal & proximal-mid LAD; because of atretic LIMA-LDA   ESOPHAGEAL BRUSHING  07/07/2021   Procedure: ESOPHAGEAL BRUSHING;  Surgeon: Eloise Harman, DO;  Location: [facility];  Service: Endoscopy;;   ESOPHAGOGASTRODUODENOSCOPY (EGD) WITH PROPOFOL N/A 07/07/2021   Procedure: ESOPHAGOGASTRODUODENOSCOPY (EGD) WITH PROPOFOL;  Surgeon: Eloise Harman, DO;  Location: [facility];  Service: Endoscopy;  Laterality: N/A;  9:15am   LEFT HEART CATH AND CORONARY ANGIOGRAPHY N/A 09/18/2017   Procedure: LEFT HEART CATH AND CORONARY ANGIOGRAPHY;  Surgeon: Martinique, Peter M, MD;  Location: [place] [facility];  Service: Cardiovascular;  Laterality: N/A;   LEFT HEART CATHETERIZATION WITH CORONARY/GRAFT ANGIOGRAM N/A 08/22/2011   Procedure: LEFT HEART CATHETERIZATION WITH Beatrix Fetters;  Surgeon: Leonie Man, MD;  Location: [facility] C[facility];  Service: Cardiovascular:  Known occluded LIMA-LAD and ostial LAD. Moderate to severe proximal circumflex and RCA disease. Widely patent freeLRAD-dLAD, as well as SVG-RPDA (backfilling RPL), SVG-OM 2 (backfilling OM1)   SHOULDER OPEN ROTATOR CUFF REPAIR  10/2002   Dr. Noemi Chapel   SP [facility] VERT OR THOR CAROTID STENT Left October 2002; November 2005   Left Vertebral stent placed in October 2002 (Dr. Patrecia Pour); redo PCI in 2005   [place] Left 11/2003   Dr. Gwenlyn Found   TRANSTHORACIC ECHOCARDIOGRAM  04/10/2020   [facility]): Normal LV size and function.  EF 60 to 65%.  GR 1 DD.  Mild AI.  Mild to moderate LA dilation.  GR 1 DD.[name]   TRANSTHORACIC ECHOCARDIOGRAM  02/2021   EF 70 to 75%.  Hyperdynamic.  No RWMA.  GR 1 DD.  Normal RV, RVP and low normal RAP.  Normal valves.    Social History:  reports that he has quit smoking. His smoking use included cigarettes. He has a 90.00 pack-year smoking history. He has never used smokeless tobacco. He reports that he does not drink alcohol and does not use drugs.   Allergies  Allergen  Reactions   Iohexol Other (See Comments)    Intractable shaking.   Contrast Media [Iodinated Contrast Media]     Shivering & shaking. Pt reports takes antihistamine prior to procedures.   Metformin And Related Diarrhea    Subsequently discontinued    Family History  Problem Relation Age of Onset   Diabetes Mother    Diabetes Sister    Diabetes Brother      Prior to Admission medications   Medication Sig Start Date End Date Taking? Authorizing Provider  acetaminophen (TYLENOL) 500 MG tablet Take 1,000 mg by mouth every 6 (six) hours as needed for mild pain or headache.    [provider]  albuterol (VENTOLIN HFA) 108 (90 Base) MCG/ACT inhaler Inhale 2 puffs into the lungs every 6 (six) hours as needed for wheezing or shortness of breath. 03/04/20   [provider]  aspirin EC 81 MG tablet Take 81 mg by mouth daily with breakfast.  [provider]  Brinzolamide-Brimonidine 1-0.2 % SUSP Place 1 drop into the right eye 3 (three) times daily.    [provider]  Cholecalciferol (VITAMIN D) 2000 units tablet Take 2,000 Units by mouth daily with breakfast.    [provider]  clopidogrel (PLAVIX) 75 MG tablet Take 75 mg by mouth daily.    [provider]  doxazosin (CARDURA) 8 MG tablet Take 1 tablet (8 mg total) by mouth at bedtime. 02/05/21   Barton Dubois, MD  finasteride (PROSCAR) 5 MG tablet Take 5 mg by mouth daily. 01/15/21   [provider]  fluconazole (DIFLUCAN) 200 MG tablet Take 2 tablets on day 1 then 1 tablet daily thereafter for a total of 14 days. 08/12/21   Eloise Harman, DO  gabapentin (NEURONTIN) 300 MG capsule Take 600 mg by mouth 3 (three) times daily.    [provider]  isosorbide mononitrate (IMDUR) 60 MG 24 hr tablet May take 1 and 1/2 tablets to 2 tablets a day depending on if chest discomfort is present Patient taking differently: Take 120 mg by mouth daily. 11/21/16   Leonie Man, MD   nitroGLYCERIN (NITROSTAT) 0.4 MG SL tablet Place 1 tablet (0.4 mg total) under the tongue every 5 (five) minutes as needed for up to 25 days for chest pain. And increase blood pressure greater than 128 systolic 7/86/76 07/22/92  Leonie Man, MD  Omega-3 Fatty Acids (FISH OIL) 1000 MG CAPS Take 1,000 mg by mouth 2 (two) times daily with a meal.     [provider]  omeprazole (PRILOSEC) 20 MG capsule Take 20 mg by mouth daily. 09/06/19   [provider]  prednisoLONE acetate (PRED FORTE) 1 % ophthalmic suspension Place 1 drop into the left eye daily.    [provider]  rosuvastatin (CRESTOR) 40 MG tablet TAKE 1 TABLET BY MOUTH DAILY 03/26/21   Leonie Man, MD  TRESIBA FLEXTOUCH 100 UNIT/ML FlexTouch Pen Inject 12 Units into the skin at bedtime. 02/16/21   [provider]  vitamin C (ASCORBIC ACID) 500 MG tablet Take 500 mg by mouth 2 (two) times daily with a meal.     [provider]    Physical Exam: BP (!) 143/60   Pulse (!) 56   Temp 97.6 F (36.4 C) (Oral)   Resp 18   Ht 5\' 6"  (1.676 m)   Wt 55.9 kg   SpO2 97%   BMI 19.88 kg/m   General: 88 y.o. year-old male well developed well nourished in no acute distress.  Alert and oriented x3. HEENT: NCAT, EOMI, dry mucous membrane Neck: Supple, trachea medial Cardiovascular: Regular rate and rhythm with no rubs or gallops.  No thyromegaly or JVD noted.  No lower extremity edema. 2/4 pulses in all 4 extremities. Respiratory: Clear to auscultation with no wheezes or rales. Good inspiratory effort. Abdomen: Soft, nontender nondistended with normal bowel sounds x4 quadrants. Muskuloskeletal: Tender to palpation of chest.  No cyanosis, clubbing or edema noted bilaterally Neuro: CN II-XII intact, strength 5/5 x 4, sensation, reflexes intact Skin: No ulcerative lesions noted or rashes Psychiatry: Judgement and insight appear normal. Mood is appropriate for condition and setting          Labs  on Admission:  Basic Metabolic Panel: Recent Labs  Lab 10/21/21 0348  NA 141  K 4.1  CL 111  CO2 27  GLUCOSE 192*  BUN 16  CREATININE 0.88  CALCIUM 8.8*   Liver  Function Tests: No results for input(s): "AST", "ALT", "ALKPHOS", "BILITOT", "PROT", "ALBUMIN" in the last 168 hours. No results for input(s): "LIPASE", "AMYLASE" in the last 168 hours. No results for input(s): "AMMONIA" in the last 168 hours. CBC: Recent Labs  Lab 10/21/21 0348  WBC 4.6  HGB 12.1*  HCT 35.6*  MCV 90.6  PLT 151   Cardiac Enzymes: No results for input(s): "CKTOTAL", "CKMB", "CKMBINDEX", "TROPONINI" in the last 168 hours.  BNP (last 3 results) No results for input(s): "BNP" in the last 8760 hours.  ProBNP (last 3 results) No results for input(s): "PROBNP" in the last 8760 hours.  CBG: No results for input(s): "GLUCAP" in the last 168 hours.  Radiological Exams on Admission: DG Chest Portable 1 View  Result Date: 10/21/2021 CLINICAL DATA:  Chest pain EXAM: PORTABLE CHEST 1 VIEW COMPARISON:  06/10/2021 FINDINGS: Prior CABG. Biapical scarring. No acute confluent opacities or effusions. No acute bony abnormality. IMPRESSION: No active disease. Electronically Signed   By: Rolm Baptise M.D.   On: 10/21/2021 03:10    EKG: I independently viewed the EKG done and my findings are as followed: Normal sinus rhythm at rate of 64 bpm with PVCs  Assessment/Plan Present on Admission:  Atypical chest pain  Principal Problem:   Atypical chest pain  Atypical chest pain Patient has both reproducible nonreproducible component of chest pain Cardiovascular risk factors include hypertension, hyperlipidemia, CAD s/p CABG, T2DM Continue telemetry  Troponins x2 - 6 > 7 EKG personally reviewed showed normal sinus rhythm at rate of 64 bpm with PVCs Continue Tylenol as needed Cardiology will be consulted to help decide if Stress test is needed in am Versus other  diagnostic modalities.    Give aspirin,  nitroglycerin prn  Generalized weakness in the setting of above Continue fall precaution Protein supplement to be provided Folate and vitamin B12 levels will be checked  T2DM with hyperglycemia CBG 192, hemoglobin A1c on 9/28 was 7.8 Patient with brittle diabetes per daughter at bedside Continue basal insulin, ISS and hypoglycemic protocol  GERD Continue Protonix  CAD  s/p CABG x3 then Re-DO CABG x1 (LRad-dLAD) after atretic LIMA & LAD stent occlusion.- (present on admission) Continue aspirin, Plavix, Crestor, Imdur, nitroglycerin as needed  Essential hypertension Continue Imdur, doxazosin Continue IV hydralazine 10 mg every 6 hours as needed for SBP . 170  Mixed hyperlipidemia Continue Crestor, Lovaza  Chronic diastolic CHF Echocardiogram done on 02/04/2021 showed LVEF of 70 to 75%.  No RWMA.  G1 DD Continue total input/output, daily weights and fluid restriction Continue heart healthy/modified carb diet   BPH Continue finasteride, doxazosin  Chronic pain  Continue gabapentin  Order home meds: Albuterol as needed  DVT prophylaxis: Lovenox  Code Status: DNR   Consults: Cardiology  Family Communication: Wife and daughter at bedside (all questions answered to satisfaction)  Severity of Illness: The appropriate patient status for this patient is OBSERVATION. Observation status is judged to be reasonable and necessary in order to provide the required intensity of service to ensure the patient's safety. The patient's presenting symptoms, physical exam findings, and initial radiographic and laboratory data in the context of their medical condition is felt to place them at decreased risk for further clinical deterioration. Furthermore, it is anticipated that the patient will be medically stable for discharge from the hospital within 2 midnights of admission.   Author: Bernadette Hoit, DO 10/21/2021 5:54 AM  For on call review [email or web address].

## 2021-10-21 NOTE — Assessment & Plan Note (Signed)
Continue Crestor 

## 2021-10-21 NOTE — Assessment & Plan Note (Signed)
Continue aspirin and Plavix Cardiology consult

## 2021-10-21 NOTE — ED Notes (Signed)
Pt.'s cbg = 84 per dexcom. Pt. Requested juice. Juice given. Will continue to monitor.

## 2021-10-21 NOTE — Progress Notes (Signed)
PROGRESS NOTE  [name] HYI:[id number] DOB: 21-Aug-1932 DOA: 10/21/2021 PCP: [facility]  Brief History:  88 year old male with a history of coronary artery disease status post CABG with redo, hyperlipidemia, hypertension, diabetes mellitus type 2, left vertebral artery stenosis, left subclavian stenosis, and BPH presenting with near syncopal episode.  The patient states that he was getting out of bed in the early morning 10/21/2021 to use the restroom.  After standing up, the patient felt dizzy and lightheaded.  He had to grab a hold of bar on the wall and ease himself down to the floor.  He described having some worsening substernal chest discomfort.  He denied any loss of consciousness.  Subsequently, his wife came to his assistance and called his daughter who lives across the street and activated EMS.  The patient has a Dexcom CGM, and her daughter stated that it was reading in the 250s when she arrived to the check on her dad. Nevertheless, the patient states that he has had numerous low readings on his CGM, usually in the early mornings.  He states that his oral intake is fair, but feels limited in terms of what he can eat because of limitations placed by his physicians.  He denies any fevers, chills, worsening shortness of breath, nausea, vomiting, diarrhea, abdominal pain.  He denies any dysuria, hematuria, hematochezia, melena.  He denies any new medications. Notably, the patient was hospitalized 02/03/2021 to 02/05/2021 for syncope.  He was felt to be vasovagal at that time.  He was discharged home with Florinef.  This has been since discontinued by his PCP. The patient states that he has chest wall pain and chest discomfort 24/7, but he states that his episode last evening prior to this admission was worse than usual.  He states that his chest discomfort is back to its baseline at the time of my evaluation.  He states that he has been taking gabapentin for the  pain. In the ED, the patient was afebrile and hemodynamically stable with oxygen saturation 97% room air.  BMP showed sodium 141, potassium 4.1, bicarbonate 27, serum creatinine 0.88.  WBC 4.6, hemoglobin 12.1, platelets 151,000.     Assessment and Plan: * Near syncope Suspect the patient had some orthostasis resulting in his presentation -Check orthostatics -judicious IV fluids -Monitor on telemetry -Echocardiogram  Chest pain The patient has a component of chest wall pain Patient describes chronic chest pain for which he takes Imdur 90 mg twice daily Cardiology consult Echocardiogram  Generalized weakness Obtain UA TSH Magnesium B12 PT evaluation  Essential hypertension, benign Previously on metoprolol, discontinued after his hospitalization November 2023 due to his orthostatic hypotension -- We will need to tolerate some degree of permissive hypertension  Uncontrolled type 2 diabetes mellitus with hyperglycemia, with long-term current use of insulin ([place]) 09/30/2021 hemoglobin A1c 7.8 No longer taking Jardiance or Trulicity NovoLog sliding scale Start reduced dose Semglee  Peripheral arterial disease - left vertebral and subclavian artery stenosis status post PTA/stent  Status post left vertebral stent by Dr. Gwenlyn Found 2005.  Previously done in 2002.    BPH (benign prostatic hyperplasia) Continue finasteride and Cardura  GERD (gastroesophageal reflux disease) Continue PPI  CAD -> CABG x3 then Re-DO CABG x1 (LRad-dLAD) after atretic LIMA & LAD stent occlusion. Continue aspirin and Plavix Cardiology consult  Mixed hyperlipidemia Continue Crestor         Family Communication:   daughter updated at bedside 10/19  Consultants:  cardiology  Code Status:  DNR--confirmed with patient and daughter 10/19  DVT Prophylaxis:  [place] Lovenox   Procedures: As Listed in Progress Note Above  Antibiotics: None       Subjective: He complains of some substernal  chest discomfort.  He denies any fevers, chills, shortness breath, nausea, vomiting, diarrhea abdominal pain.  Objective: Vitals:   10/21/21 0250 10/21/21 0300 10/21/21 0400 10/21/21 0500  BP:  (!) 173/53 (!) 152/52 (!) 143/60  Pulse:  65 (!) 57 (!) 56  Resp:  14 20 18   Temp: 97.6 F (36.4 C)     TempSrc: Oral     SpO2:  98% 98% 97%  Weight:      Height:       No intake or output data in the 24 hours ending 10/21/21 0738 Weight change:  Exam:  General:  Pt is alert, follows commands appropriately, not in acute distress HEENT: No icterus, No thrush, No neck mass, [place]/AT Cardiovascular: RRR, S1/S2, no rubs, no gallops Respiratory: CTA bilaterally, no wheezing, no crackles, no rhonchi Abdomen: Soft/+BS, non tender, non distended, no guarding Extremities: No edema, No lymphangitis, No petechiae, No rashes, no synovitis   Data Reviewed: I have personally reviewed following labs and imaging studies Basic Metabolic Panel: Recent Labs  Lab 10/21/21 0348  NA 141  K 4.1  CL 111  CO2 27  GLUCOSE 192*  BUN 16  CREATININE 0.88  CALCIUM 8.8*   Liver Function Tests: No results for input(s): "AST", "ALT", "ALKPHOS", "BILITOT", "PROT", "ALBUMIN" in the last 168 hours. No results for input(s): "LIPASE", "AMYLASE" in the last 168 hours. No results for input(s): "AMMONIA" in the last 168 hours. Coagulation Profile: No results for input(s): "INR", "PROTIME" in the last 168 hours. CBC: Recent Labs  Lab 10/21/21 0348  WBC 4.6  HGB 12.1*  HCT 35.6*  MCV 90.6  PLT 151   Cardiac Enzymes: No results for input(s): "CKTOTAL", "CKMB", "CKMBINDEX", "TROPONINI" in the last 168 hours. BNP: Invalid input(s): "POCBNP" CBG: No results for input(s): "GLUCAP" in the last 168 hours. HbA1C: No results for input(s): "HGBA1C" in the last 72 hours. Urine analysis:    Component Value Date/Time   COLORURINE YELLOW 02/03/2021 [place] 02/03/2021 1208   LABSPEC <1.005 (L)  02/03/2021 1208   PHURINE 6.5 02/03/2021 1208   GLUCOSEU >=500 (A) 02/03/2021 1208   HGBUR TRACE (A) 02/03/2021 [place] 02/03/2021 1208   KETONESUR NEGATIVE 02/03/2021 1208   PROTEINUR TRACE (A) 02/03/2021 1208   UROBILINOGEN 1.0 05/22/2009 2346   NITRITE NEGATIVE 02/03/2021 1208   LEUKOCYTESUR NEGATIVE 02/03/2021 1208   Sepsis Labs: @LABRCNTIP (procalcitonin:4,lacticidven:4) )No results found for this or any previous visit (from the past 240 hour(s)).   Scheduled Meds:  feeding supplement (GLUCERNA SHAKE)  237 mL Oral TID BM   insulin aspart  0-5 Units Subcutaneous QHS   insulin aspart  0-9 Units Subcutaneous TID WC   insulin glargine-yfgn  5 Units Subcutaneous QHS   Continuous Infusions:  Procedures/Studies: DG Chest Portable 1 View  Result Date: 10/21/2021 CLINICAL DATA:  Chest pain EXAM: PORTABLE CHEST 1 VIEW COMPARISON:  06/10/2021 FINDINGS: Prior CABG. Biapical scarring. No acute confluent opacities or effusions. No acute bony abnormality. IMPRESSION: No active disease. Electronically Signed   By: Rolm Baptise M.D.   On: 10/21/2021 03:10    Orson Eva, DO  [facility]  If 7PM-7AM, please contact night-coverage [email or web address] Password [email or web address] 10/21/2021, 7:38 AM   LOS:  0 days

## 2021-10-21 NOTE — Evaluation (Signed)
Physical Therapy Evaluation Patient Details Name: [name] MRN: [id number] DOB: 1932-12-26 Today's Date: 10/21/2021  History of Present Illness  [name] is a 88 y.o. male with medical history significant of CAD s/p CABG x 3, T2DM, hypertension, hyperlipidemia, chronic pain syndrome who presents to the emergency department via EMS from home due to sudden onset of generalized weakness which occurred shortly prior to arrival to the ED. patient states that he got up to use the restroom and then felt sharp pain in the midsternal/left-sided chest area, chest pain was 5-6/10 on pain scale and it was associated with chest tightness and weakness.  Chest pain was nonradiating and it has both reproducible and not reproducible components.  There was no known aggravating factors, but there was slight alleviation of pain with aspirin and nitroglycerin given by EMS team.  Patient states that he was so weak that he had to lower himself to the floor to avoid falling.  He complained of dizziness and shortness of breath, but denies diaphoresis, nausea, vomiting, abdominal pain.   Clinical Impression  Patient independent for bed mobility, able to sit to stand and take a few steps in room without AD but had to hold onto furniture for stability, switched to RW for hallway ambulation with increased safety/stability and good carryover. Patient's orthostatics as follows: supine: 164/57 mmHg, sitting: 163/64 mmHg, standing: 144/55 mmHg. Patient will benefit from continued skilled physical therapy in hospital and recommended venue below to increase strength, balance, endurance for safe ADLs and gait.      Recommendations for follow up therapy are one component of a multi-disciplinary discharge planning process, led by the attending physician.  Recommendations may be updated based on patient status, additional functional criteria and insurance authorization.  Follow Up Recommendations Home health PT       Assistance Recommended at Discharge Set up Supervision/Assistance  Patient can return home with the following  A little help with walking and/or transfers;A little help with bathing/dressing/bathroom;Assistance with cooking/housework;Help with stairs or ramp for entrance    Equipment Recommendations None recommended by PT  Recommendations for Other Services       Functional Status Assessment Patient has had a recent decline in their functional status and demonstrates the ability to make significant improvements in function in a reasonable and predictable amount of time.     Precautions / Restrictions Precautions Precautions: Fall Restrictions Weight Bearing Restrictions: No      Mobility  Bed Mobility Overal bed mobility: Independent                  Transfers Overall transfer level: Needs assistance Equipment used: None, Rolling walker (2 wheels) Transfers: Sit to/from Stand Sit to Stand: Supervision           General transfer comment: patient able to sit to stand w/o AD, unsteady on feet, used RW for hallway ambulation, patient got back in bed with RW    Ambulation/Gait Ambulation/Gait assistance: Supervision Gait Distance (Feet): 90 Feet Assistive device: Rolling walker (2 wheels) Gait Pattern/deviations: Decreased step length - right, Decreased step length - left, Decreased stride length Gait velocity: decreased     General Gait Details: Patient unsteady on feet after sit to stand w/o AD needing to hold onto room furniture for stability, switched to RW for hallway ambulation with increased safety/stability and good carryover  Stairs            Wheelchair Mobility    Modified Rankin (Stroke Patients Only)  Balance Overall balance assessment: Needs assistance Sitting-balance support: No upper extremity supported, Feet supported Sitting balance-Leahy Scale: Good Sitting balance - Comments: seated EOB   Standing balance support: No  upper extremity supported, During functional activity, Single extremity supported Standing balance-Leahy Scale: Fair Standing balance comment: poor/fair upon standing w/o AD, needing to hang onto door handle to stay steady, fair/good with use of RW                             Pertinent Vitals/Pain Pain Assessment Pain Assessment: 0-10 Pain Score: 3  Pain Location: chest Pain Descriptors / Indicators: Pressure, Sharp Pain Intervention(s): Limited activity within patient's tolerance, Monitored during session, Repositioned    Home Living Family/patient expects to be discharged to:: Private residence Living Arrangements: Children Available Help at Discharge: Family;Available 24 hours/day Type of Home: Mobile home Home Access: Ramped entrance;Stairs to enter Entrance Stairs-Rails: None Entrance Stairs-Number of Steps: 1   Home Layout: One level Home Equipment: Conservation officer, nature (2 wheels);Cane - single point;Grab bars - tub/shower;Shower seat;BSC/3in1 Additional Comments: Per previous notes    Prior Function Prior Level of Function : Independent/Modified Independent       Physical Assist : ADLs (physical)   ADLs (physical): IADLs Mobility Comments: Community ambulator w/o AD, does not drive ADLs Comments: Independent, daughter gets Actor Dominance   Dominant Hand: Right    Extremity/Trunk Assessment   Upper Extremity Assessment Upper Extremity Assessment: Defer to OT evaluation    Lower Extremity Assessment Lower Extremity Assessment: Overall WFL for tasks assessed    Cervical / Trunk Assessment Cervical / Trunk Assessment: Normal  Communication   Communication: No difficulties  Cognition Arousal/Alertness: Awake/alert Behavior During Therapy: WFL for tasks assessed/performed Overall Cognitive Status: Within Functional Limits for tasks assessed                                          General Comments      Exercises      Assessment/Plan    PT Assessment Patient needs continued PT services  PT Problem List Decreased strength;Decreased activity tolerance;Decreased balance;Decreased mobility       PT Treatment Interventions DME instruction;Gait training;Stair training;Functional mobility training;Therapeutic activities;Therapeutic exercise;Balance training;Patient/family education    PT Goals (Current goals can be found in the Care Plan section)  Acute Rehab PT Goals Patient Stated Goal: return home with family to assist PRN PT Goal Formulation: With patient/family Time For Goal Achievement: 10/25/21 Potential to Achieve Goals: Good    Frequency Min 3X/week     Co-evaluation PT/OT/SLP Co-Evaluation/Treatment: Yes Reason for Co-Treatment: To address functional/ADL transfers PT goals addressed during session: Mobility/safety with mobility;Balance;Proper use of DME OT goals addressed during session: ADL's and self-care       AM-PAC PT "6 Clicks" Mobility  Outcome Measure Help needed turning from your back to your side while in a flat bed without using bedrails?: None Help needed moving from lying on your back to sitting on the side of a flat bed without using bedrails?: None Help needed moving to and from a bed to a chair (including a wheelchair)?: A Little Help needed standing up from a chair using your arms (e.g., wheelchair or bedside chair)?: A Little Help needed to walk in hospital room?: A Little Help needed climbing 3-5 steps with a railing? : A Little  6 Click Score: 20    End of Session   Activity Tolerance: Patient tolerated treatment well;Patient limited by fatigue Patient left: in bed;with call bell/phone within reach;with family/visitor present Nurse Communication: Mobility status;Other (comment) (orthostatics) PT Visit Diagnosis: Unsteadiness on feet (R26.81);Muscle weakness (generalized) (M62.81);Other abnormalities of gait and mobility (R26.89)    Time: 4715-8063 PT Time  Calculation (min) (ACUTE ONLY): 31 min   Charges:   PT Evaluation $PT Eval Moderate Complexity: 1 Mod PT Treatments $Therapeutic Activity: 23-37 mins        Zigmund Gottron, SPT

## 2021-10-21 NOTE — Assessment & Plan Note (Addendum)
Obtain UA--no pyuria TSH 4.473, FT4--0.75 Magnesium--1.9 B12--145>>replete PT evaluation>>HHPT

## 2021-10-21 NOTE — Progress Notes (Signed)
  Transition of Care [facility]) Screening Note   Patient Details  Name: [name] Date of Birth: 30-Mar-1932   Transition of Care [facility]) CM/SW Contact:    Ihor Gully, LCSW Phone Number: 10/21/2021, 2:42 PM    Transition of Care Department [facility]) has reviewed patient and no TOC needs have been identified at this time. We will continue to monitor patient advancement through interdisciplinary progression rounds. If new patient transition needs arise, please place a TOC consult.

## 2021-10-21 NOTE — Hospital Course (Signed)
88 year old male with a history of coronary artery disease status post CABG with redo, hyperlipidemia, hypertension, diabetes mellitus type 2, left vertebral artery stenosis, left subclavian stenosis, and BPH presenting with near syncopal episode.  The patient states that he was getting out of bed in the early morning 10/21/2021 to use the restroom.  After standing up, the patient felt dizzy and lightheaded.  He had to grab a hold of bar on the wall and ease himself down to the floor.  He described having some worsening substernal chest discomfort.  He denied any loss of consciousness.  Subsequently, his wife came to his assistance and called his daughter who lives across the street and activated EMS.  The patient has a Dexcom CGM, and her daughter stated that it was reading in the 250s when she arrived to the check on her dad. Nevertheless, the patient states that he has had numerous low readings on his CGM, usually in the early mornings.  He states that his oral intake is fair, but feels limited in terms of what he can eat because of limitations placed by his physicians.  He denies any fevers, chills, worsening shortness of breath, nausea, vomiting, diarrhea, abdominal pain.  He denies any dysuria, hematuria, hematochezia, melena.  He denies any new medications. Notably, the patient was hospitalized 02/03/2021 to 02/05/2021 for syncope.  He was felt to be vasovagal at that time.  He was discharged home with Florinef.  This has been since discontinued by his PCP. The patient states that he has chest wall pain and chest discomfort 24/7, but he states that his episode last evening prior to this admission was worse than usual.  He states that his chest discomfort is back to its baseline at the time of my evaluation.  He states that he has been taking gabapentin for the pain. In the ED, the patient was afebrile and hemodynamically stable with oxygen saturation 97% room air.  BMP showed sodium 141, potassium 4.1,  bicarbonate 27, serum creatinine 0.88.  WBC 4.6, hemoglobin 12.1, platelets 151,000.

## 2021-10-21 NOTE — Assessment & Plan Note (Signed)
Continue finasteride and Cardura

## 2021-10-21 NOTE — Assessment & Plan Note (Addendum)
Previously on metoprolol, discontinued after his hospitalization November 2023 due to his orthostatic hypotension -- We will need to tolerate some degree of permissive hypertension -- amlodipine started by cardiology

## 2021-10-21 NOTE — Consult Note (Signed)
Cardiology Consultation   Patient ID: [name] MRN: [id number]; DOB: 05/24/1932  Admit date: 10/21/2021 Date of Consult: 10/21/2021  PCP:  [facility], [place] [facility] Cardiologist:  Glenetta Hew, MD        Patient Profile:   [name] is a 88 y.o. male with a hx of CAD (s/p CABG in 1995 with re-do CABG in 2001 with left radial-distal LAD, cath in 09/2017 showing patent radial-LAD, patent SVG-OM1 and patent SVG-dRCA), HTN, HLD, Type 2 DM and PAD who is being seen 10/21/2021 for the evaluation of presyncope and atypical chest pain at the request of Dr. Josephine Cables.  History of Present Illness:   [name] was last examined by Dr. Ellyn Hack in 03/2021 following a recent hospitalization for syncope which occurred while having a bowel movement and was felt to be of a vasovagal etiology. He was started on Florinef at the time of discharge. He denied any recurrent symptoms at the time of his office visit but his weight had declined by an additional 10 pounds and this was felt to be secondary to lack of nutrition. He was continued on ASA 81 mg daily, Plavix 75 mg daily, Cardura 8 mg daily, Florinef 100 mcg daily, Imdur 90 mg daily and Crestor 40 mg daily. He was no longer on beta-blocker therapy due to orthostatic hypotension. He did have a telehealth visit in 05/2021 for preoperative cardiac clearance prior to upcoming EGD and was cleared to proceed at that time.  He presented to [facility] ED during the early morning hours of 10/21/2021 for evaluation of an episode of weakness. In talking with the patient, his wife and his daughter today, he reports being in his usual state of health until he had a presyncopal episode last night after getting up to go to the restroom. Reports he had urinated and then became acutely dizzy and called out to his wife. They lowered him to the ground but he did not actually lose consciousness. He denies any associated chest pain  or palpitations at that time. Says he has experienced chest pain ever since undergoing CABG in 1995 and his pain occurs at rest or with activity and is typically worse with deep breathing and has not changed in frequency or severity recently. No recent orthopnea, PND or pitting edema. In reviewing his medications, he reports he is no longer on Florinef as his PCP discontinued this secondary to what sounds like elevated BP. He has been experiencing variable blood sugar readings at home as he reports his glucose can be at 400 and then rapidly drops to 30 or 40.  Initial labs showed WBC 4.6, Hgb 12.1, platelets 151, Na+ 141, K+ 4.1 and creatinine 0.88.  Initial and repeat troponin values negative at 6 and 7.  TSH pending.  CXR with no active cardiopulmonary disease. EKG shows normal sinus rhythm, heart rate 64 with borderline first-degree AV block and PVC's.  Past Medical History:  Diagnosis Date   Arthritis    Asymptomatic stenosis of left vertebral artery 2002, 2005   Status post PTA/stent with redo; normal antegrade flow on dopplers 09/2013   CAD (coronary artery disease) 1995   1CABG x3; redo in CABG x 1 2001 Free Radial to LAD; All grafts patent by Cath 08/2014: The proximal to mid LAD has poor retrograde filling from the LIMA due to in-stent restenosis. 50% ostial disease of free radial artery to the LAD.   CAD (coronary artery disease) of artery  bypass graft 2000   PCI x 2 - ostial & prox-mid LAD (BMS) for atretic LIMA-LAD   CAD in native artery 1995   CABG x 3 - LIMA-LAD, SVG-OM2, SVG-rPDA   Chest pain 09/2017   DM (diabetes mellitus) type II controlled peripheral vascular disorder 2002   Left subclavian and left vertebral artery stenoses   GERD (gastroesophageal reflux disease)    Glaucoma    Hyperlipidemia LDL goal <70    Hypertension, essential    S/P CABG x 3 1995   S/P Redo CABG x 1 2001   L Radial-LAD after 2 failed attempts @ LIMA-LAD PTCA; LAD stents 100% occluded   Stenosis of  left subclavian artery ([place]) 11/2003   Status post PTA/stent --> < 50 % stenosis by Dopplers 09/2013    Past Surgical History:  Procedure Laterality Date   BALLOON DILATION N/A 07/07/2021   Procedure: BALLOON DILATION;  Surgeon: Eloise Harman, DO;  [facility];  Service: Endoscopy;  Laterality: N/A;   CARDIAC CATHETERIZATION N/A 08/04/2014   Procedure: Left Heart Cath and Cors/Grafts Angiography;  Surgeon: Leonie Man, MD;  Location: [place] CV LAB;  Service: Cardiovascular: o-mRCA 70%, m-dRCA ~70% - SVG-dRCA Patent.  o-pLAD 100% stent occluded, mLAD 95% ISR with poor retrograde filling from freeRadiial graft-LAD (50% ostial graft dz).  o-p Cx 80%. Widely patent SVG-Cx-OM fills retrograde to 80% lesion.; Normal LV Fxn & EDP   CORONARY ANGIOPLASTY  April and May 2001   After Both LAD stents occluded - PTCA of anastomatic LIMA-LAD lesion -- Unsuccessful.   CORONARY ARTERY BYPASS GRAFT  1995   LIMA-LAD, SVG-OM2, SVG-rPDA   CORONARY ARTERY BYPASS GRAFT  06/1999   Dr. Lucianne Lei Trigt: Redo LAD grafting with free Left Radial-distal LAD   CORONARY STENT PLACEMENT  1995-2000   2 BMS stents to osital-proximal & proximal-mid LAD; because of atretic LIMA-LDA   ESOPHAGEAL BRUSHING  07/07/2021   Procedure: ESOPHAGEAL BRUSHING;  Surgeon: Eloise Harman, DO;  Location: [facility];  Service: Endoscopy;;   ESOPHAGOGASTRODUODENOSCOPY (EGD) WITH PROPOFOL N/A 07/07/2021   Procedure: ESOPHAGOGASTRODUODENOSCOPY (EGD) WITH PROPOFOL;  Surgeon: Eloise Harman, DO;  [facility]: [facility];  Service: Endoscopy;  Laterality: N/A;  9:15am   LEFT HEART CATH AND CORONARY ANGIOGRAPHY N/A 09/18/2017   Procedure: LEFT HEART CATH AND CORONARY ANGIOGRAPHY;  Surgeon: Martinique, Peter M, MD;  Location: [place] [facility] LAB;  Service: Cardiovascular;  Laterality: N/A;   LEFT HEART CATHETERIZATION WITH CORONARY/GRAFT ANGIOGRAM N/A 08/22/2011   Procedure: LEFT HEART CATHETERIZATION WITH Beatrix Fetters;   Surgeon: Leonie Man, MD;  Location: [facility] CATH LAB;  Service: Cardiovascular:  Known occluded LIMA-LAD and ostial LAD. Moderate to severe proximal circumflex and RCA disease. Widely patent freeLRAD-dLAD, as well as SVG-RPDA (backfilling RPL), SVG-OM 2 (backfilling OM1)   SHOULDER OPEN ROTATOR CUFF REPAIR  10/2002   Dr. Noemi Chapel   [facility] VERT OR THOR CAROTID STENT Left October 2002; November 2005   Left Vertebral stent placed in October 2002 (Dr. Patrecia Pour); redo PCI in 2005   [place] Left 11/2003   Dr. Gwenlyn Found   TRANSTHORACIC ECHOCARDIOGRAM  04/10/2020   [facility]): Normal LV size and function.  EF 60 to 65%.  GR 1 DD.  Mild AI.  Mild to moderate LA dilation.  GR 1 DD.[name]   TRANSTHORACIC ECHOCARDIOGRAM  02/2021   EF 70 to 75%.  Hyperdynamic.  No RWMA.  GR 1 DD.  Normal RV, RVP and low normal  RAP.  Normal valves.     Home Medications:  Prior to Admission medications   Medication Sig Start Date End Date Taking? Authorizing Provider  acetaminophen (TYLENOL) 500 MG tablet Take 1,000 mg by mouth every 6 (six) hours as needed for mild pain or headache.   Yes [provider]  albuterol (VENTOLIN HFA) 108 (90 Base) MCG/ACT inhaler Inhale 2 puffs into the lungs every 6 (six) hours as needed for wheezing or shortness of breath. 03/04/20  Yes [provider]  aspirin EC 81 MG tablet Take 81 mg by mouth daily with breakfast.    Yes [provider]  Brinzolamide-Brimonidine 1-0.2 % SUSP Place 1 drop into the right eye 3 (three) times daily.   Yes [provider]  Cholecalciferol (VITAMIN D) 2000 units tablet Take 2,000 Units by mouth daily with breakfast.   Yes [provider]  clopidogrel (PLAVIX) 75 MG tablet Take 75 mg by mouth daily.   Yes [provider]  diclofenac Sodium (VOLTAREN) 1 % GEL Apply 2 g topically 4 (four) times daily.   Yes [provider]  doxazosin (CARDURA) 8 MG tablet Take 1 tablet (8 mg total) by  mouth at bedtime. 02/05/21  Yes Barton Dubois, MD  finasteride (PROSCAR) 5 MG tablet Take 5 mg by mouth daily. 01/15/21  Yes [provider]  gabapentin (NEURONTIN) 300 MG capsule Take 600 mg by mouth 3 (three) times daily.   Yes [provider]  isosorbide mononitrate (IMDUR) 60 MG 24 hr tablet May take 1 and 1/2 tablets to 2 tablets a day depending on if chest discomfort is present Patient taking differently: Take 60 mg by mouth daily. 11/21/16  Yes Leonie Man, MD  Omega-3 Fatty Acids (FISH OIL) 1000 MG CAPS Take 1,000 mg by mouth 2 (two) times daily with a meal.    Yes [provider]  omeprazole (PRILOSEC) 20 MG capsule Take 20 mg by mouth daily. 09/06/19  Yes [provider]  prednisoLONE acetate (PRED FORTE) 1 % ophthalmic suspension Place 1 drop into the left eye daily.   Yes [provider]  rosuvastatin (CRESTOR) 40 MG tablet TAKE 1 TABLET BY MOUTH DAILY 03/26/21  Yes Leonie Man, MD  TRESIBA FLEXTOUCH 100 UNIT/ML FlexTouch Pen Inject 30 Units into the skin at bedtime. 02/16/21  Yes [provider]  vitamin C (ASCORBIC ACID) 500 MG tablet Take 500 mg by mouth 2 (two) times daily with a meal.    Yes [provider]  fluconazole (DIFLUCAN) 200 MG tablet Take 2 tablets on day 1 then 1 tablet daily thereafter for a total of 14 days. Patient not taking: Reported on 10/21/2021 08/12/21   Eloise Harman, DO  nitroGLYCERIN (NITROSTAT) 0.4 MG SL tablet Place 1 tablet (0.4 mg total) under the tongue every 5 (five) minutes as needed for up to 25 days for chest pain. And increase blood pressure greater than 353 systolic 06/16/41 1/54/00  Leonie Man, MD    Inpatient Medications: Scheduled Meds:  feeding supplement (GLUCERNA SHAKE)  237 mL Oral TID BM   insulin aspart  0-5 Units Subcutaneous QHS   insulin aspart  0-9 Units Subcutaneous TID WC   insulin glargine-yfgn  5 Units Subcutaneous QHS   Continuous Infusions:   lactated ringers     PRN Meds: hydrALAZINE  Allergies:    Allergies  Allergen Reactions   Iohexol Other (See Comments)    Intractable shaking.   Contrast Media [Iodinated Contrast Media]  Shivering & shaking. Pt reports takes antihistamine prior to procedures.   Metformin And Related Diarrhea    Subsequently discontinued    Social History:   Social History   Socioeconomic History   Marital status: Married    Spouse name: Not on file   Number of children: Not on file   Years of education: Not on file   Highest education level: Not on file  Occupational History   Not on file  Tobacco Use   Smoking status: Former    Packs/day: 3.00    Years: 30.00    Total pack years: 90.00    Types: Cigarettes   Smokeless tobacco: Never   Tobacco comments:    quit smoking about 50 years ago  Vaping Use   Vaping Use: Never used  Substance and Sexual Activity   Alcohol use: No   Drug use: No   Sexual activity: Not Currently  Other Topics Concern   Not on file  Social History Narrative   He is married with one daughter. He has 2 grandchildren.   He does not smoke. He quit smoking in roughly 1970 after smoking 3 packs per day.   He is routinely at give him. It does not necessarily do routine exercise.    Social Determinants of Health   Financial Resource Strain: Not on file  Food Insecurity: Not on file  Transportation Needs: Not on file  Physical Activity: Not on file  Stress: Not on file  Social Connections: Not on file  Intimate Partner Violence: Not on file    Family History:    Family History  Problem Relation Age of Onset   Diabetes Mother    Diabetes Sister    Diabetes Brother      ROS:  Please see the history of present illness.   All other ROS reviewed and negative.     Physical Exam/Data:   Vitals:   10/21/21 0250 10/21/21 0300 10/21/21 0400 10/21/21 0500  BP:  (!) 173/53 (!) 152/52 (!) 143/60  Pulse:  65 (!) 57 (!) 56  Resp:  14 20 18   Temp: 97.6  F (36.4 C)     TempSrc: Oral     SpO2:  98% 98% 97%  Weight:      Height:       No intake or output data in the 24 hours ending 10/21/21 0752    10/21/2021    2:37 AM 09/30/2021    1:47 PM 08/25/2021    2:07 PM  Last 3 Weights  Weight (lbs) 123 lb 3.2 oz 123 lb 3.2 oz 124 lb 6.4 oz  Weight (kg) 55.883 kg 55.883 kg 56.427 kg     Body mass index is 19.88 kg/m.  General:  Pleasant, elderly male appearing in no acute distress. HEENT: normal Neck: no JVD Vascular: No carotid bruits; Distal pulses 2+ bilaterally Cardiac:  normal S1, S2; RRR with frequent ectopic beats.  Lungs:  clear to auscultation bilaterally, no wheezing, rhonchi or rales  Abd: soft, nontender, no hepatomegaly  Ext: no edema Musculoskeletal:  No deformities, BUE and BLE strength normal and equal Skin: warm and dry  Neuro:  CNs 2-12 intact, no focal abnormalities noted Psych:  Normal affect   EKG:  The EKG was personally reviewed and demonstrates: NSR, HR 64 with borderline first-degree AV block and PVC's.  Telemetry:  Telemetry was personally reviewed and demonstrates: Sinus bradycardia, HR in 50's to 60's with occasional PVC's.   Relevant CV Studies:  LHC: 09/2017  Prox RCA to Mid RCA lesion is 80% stenosed. Dist RCA lesion is 50% stenosed. SVG graft was visualized by angiography and is normal in caliber. Ost LM to Dist LM lesion is 90% stenosed. Ost Cx to Prox Cx lesion is 90% stenosed. Ost LAD to Prox LAD lesion is 100% stenosed. Prox LAD to Mid LAD lesion is 100% stenosed. SVG graft was visualized by angiography and is normal in caliber. The graft exhibits no disease. Left radial artery graft was visualized by angiography and is normal in caliber. The graft exhibits no disease. The left ventricular systolic function is normal. LV end diastolic pressure is normal. The left ventricular ejection fraction is greater than 65% by visual estimate.   1. Severe left main and 3 vessel obstructive CAD 2.  Patent free radial graft to the LAD 3. Patent SVG to OM1 4. Patent SVG to distal RCA 5. Normal LV function 6. Normal LVEDP   Plan: continue medical therapy. I do not see a coronary cause for his chest pain.   Carotid Dopplers: 10/2020 Summary:  Right Carotid: Velocities in the right ICA are consistent with a 1-39%  stenosis.                 Non-hemodynamically significant plaque <50% noted in the  CCA.                 Unable to obtain prior elevated velocities.   Left Carotid: Velocities in the left ICA are consistent with a 1-39%  stenosis.                Non-hemodynamically significant plaque <50% noted in the  CCA.   Vertebrals:  Left vertebral artery demonstrates antegrade flow. Right  vertebral               artery demonstrates high resistant flow.  Subclavians: Normal flow hemodynamics were seen in bilateral subclavian               arteries.   Echocardiogram: 02/2021 IMPRESSIONS     1. Left ventricular ejection fraction, by estimation, is 70 to 75%. The  left ventricle has hyperdynamic function. The left ventricle has no  regional wall motion abnormalities. Left ventricular diastolic parameters  are consistent with Grade I diastolic  dysfunction (impaired relaxation).   2. Right ventricular systolic function is normal. The right ventricular  size is normal. There is normal pulmonary artery systolic pressure.   3. The mitral valve is normal in structure. No evidence of mitral valve  regurgitation. No evidence of mitral stenosis.   4. The aortic valve is normal in structure. Aortic valve regurgitation is  trivial. No aortic stenosis is present.   5. The inferior vena cava is normal in size with greater than 50%  respiratory variability, suggesting right atrial pressure of 3 mmHg.   Laboratory Data:  High Sensitivity Troponin:   Recent Labs  Lab 10/21/21 0348 10/21/21 0556  TROPONINIHS 6 7     Chemistry Recent Labs  Lab 10/21/21 0348  NA 141  K 4.1  CL  111  CO2 27  GLUCOSE 192*  BUN 16  CREATININE 0.88  CALCIUM 8.8*  GFRNONAA >60  ANIONGAP 3*    No results for input(s): "PROT", "ALBUMIN", "AST", "ALT", "ALKPHOS", "BILITOT" in the last 168 hours. Lipids No results for input(s): "CHOL", "TRIG", "HDL", "LABVLDL", "LDLCALC", "CHOLHDL" in the last 168 hours.  Hematology Recent Labs  Lab 10/21/21 0348  WBC 4.6  RBC 3.93*  HGB 12.1*  HCT 35.6*  MCV 90.6  MCH 30.8  MCHC 34.0  RDW 11.8  PLT 151   Thyroid No results for input(s): "TSH", "FREET4" in the last 168 hours.  BNPNo results for input(s): "BNP", "PROBNP" in the last 168 hours.  DDimer No results for input(s): "DDIMER" in the last 168 hours.   Radiology/Studies:  DG Chest Portable 1 View  Result Date: 10/21/2021 CLINICAL DATA:  Chest pain EXAM: PORTABLE CHEST 1 VIEW COMPARISON:  06/10/2021 FINDINGS: Prior CABG. Biapical scarring. No acute confluent opacities or effusions. No acute bony abnormality. IMPRESSION: No active disease. Electronically Signed   By: Rolm Baptise M.D.   On: 10/21/2021 03:10     Assessment and Plan:   1. Presyncope - Similar to his prior episode in 02/2021 and was felt to be vasovagal at that time. His presyncope again occurred in the setting of urination. Orthostatics were checked this morning and BP did drop from 164/57 with lying to 144/55 with standing and he was started on IV fluids around the same timeframe these were obtained. - He was previously on Florinef but it sounds like he developed elevated BP with this. Previously used compression stockings in the past with improvement but has quit using these over the past several months.  We did review the importance of wearing compression stockings daily. He is currently receiving IV fluids and pending repeat orthostatic trends, may need to consider restarting Florinef or using low-dose Midodrine but this will be challenging given baseline HTN. Therefore, conservative measures with changing positions  slowly and the use of compression stockings may be the safest option. - Repeat echocardiogram is pending. Continue to follow on telemetry this admission but his overall presentation seems atypical for a cardiac arrhythmia. He does have frequent PVC's on telemetry but no sustained arrhythmias.  2. CAD - He is s/p CABG in 1995 with re-do CABG in 2001 with left radial-distal LAD with cath in 09/2017 showing patent radial-LAD, patent SVG-OM1 and patent SVG-dRCA.  He reports having chronic chest wall pain since undergoing CABG in 1995 and denies any acute changes in this. - Troponin values have been negative at 6 and 7 with EKG showing no acute ST changes. - Repeat echocardiogram is pending for reassessment of any structural abnormalities but would not anticipate further ischemic evaluation at this time. - Continue ASA 81 mg daily, Plavix 75 mg daily, Imdur 60 mg BID and Crestor 40mg  daily.  No longer on beta-blocker therapy given baseline bradycardia and prior orthostasis.  3. HLD - His LDL was at 50 in 07/2021. Continue Crestor 40 mg daily.  4. Carotid Artery Stenosis - Dopplers in 10/2020 showed 1 to 39% stenosis bilaterally. He is scheduled for follow-up dopplers later this month as an outpatient.  5. Type 2 DM - Hgb A1c was at 7.8 in 09/2021. His glucose readings have been variable from 30 to 400 when checked at home. Management per the admitting team.    For questions or updates, please contact [place] Please consult [email or web address] for contact info under    Signed, Erma Heritage, PA-C  10/21/2021 7:52 AM

## 2021-10-22 DIAGNOSIS — R079 Chest pain, unspecified: Secondary | ICD-10-CM | POA: Diagnosis not present

## 2021-10-22 DIAGNOSIS — E538 Deficiency of other specified B group vitamins: Secondary | ICD-10-CM | POA: Diagnosis not present

## 2021-10-22 DIAGNOSIS — R531 Weakness: Secondary | ICD-10-CM | POA: Diagnosis not present

## 2021-10-22 DIAGNOSIS — R55 Syncope and collapse: Secondary | ICD-10-CM | POA: Diagnosis not present

## 2021-10-22 DIAGNOSIS — I10 Essential (primary) hypertension: Secondary | ICD-10-CM | POA: Diagnosis not present

## 2021-10-22 DIAGNOSIS — Z794 Long term (current) use of insulin: Secondary | ICD-10-CM | POA: Diagnosis not present

## 2021-10-22 DIAGNOSIS — E1165 Type 2 diabetes mellitus with hyperglycemia: Secondary | ICD-10-CM | POA: Diagnosis not present

## 2021-10-22 LAB — BASIC METABOLIC PANEL
Anion gap: 8 (ref 5–15)
BUN: 14 mg/dL (ref 8–23)
CO2: 24 mmol/L (ref 22–32)
Calcium: 9.1 mg/dL (ref 8.9–10.3)
Chloride: 109 mmol/L (ref 98–111)
Creatinine, Ser: 0.76 mg/dL (ref 0.61–1.24)
GFR, Estimated: >60 mL/min (ref >60)
Glucose, Bld: 119 mg/dL — ABNORMAL HIGH (ref 70–99)
Potassium: 3.8 mmol/L (ref 3.5–5.1)
Sodium: 141 mmol/L (ref 135–145)

## 2021-10-22 LAB — URINE CULTURE: Culture: <10000 — AB

## 2021-10-22 LAB — GLUCOSE, CAPILLARY: Glucose-Capillary: 84 mg/dL (ref 70–99)

## 2021-10-22 LAB — MAGNESIUM: Magnesium: 1.8 mg/dL (ref 1.7–2.4)

## 2021-10-22 MED ORDER — RANOLAZINE ER 500 MG PO TB12: 500.0000 mg | ORAL_TABLET | Freq: Two times a day (BID) | ORAL | 1 refills | Status: DC

## 2021-10-22 MED ORDER — ISOSORBIDE MONONITRATE ER 60 MG PO TB24: 60.0000 mg | ORAL_TABLET | Freq: Every day | ORAL | Status: DC

## 2021-10-22 MED ORDER — CYANOCOBALAMIN 500 MCG PO TABS: 500.0000 ug | ORAL_TABLET | Freq: Every day | ORAL | Status: DC

## 2021-10-22 MED ORDER — AMLODIPINE BESYLATE 5 MG PO TABS: 5.0000 mg | ORAL_TABLET | Freq: Every day | ORAL | 1 refills | Status: DC

## 2021-10-22 NOTE — Care Management Obs Status (Signed)
[place] NOTIFICATION   Patient Details  Name: [name] MRN: [id number] Date of Birth: 11-30-1932   Medicare Observation Status Notification Given:  Yes    Tommy Medal 10/22/2021, 9:51 AM

## 2021-10-22 NOTE — Progress Notes (Signed)
Nsg Discharge Note  Admit Date:  10/21/2021 Discharge date: 10/22/2021   CAVION FAIOLA to be D/C'd Home per MD order.  AVS completed.  Copy for chart, and copy for patient signed, and dated. Patient/caregiver able to verbalize understanding.  Discharge Medication: Allergies as of 10/22/2021       Reactions   Iohexol Other (See Comments)   Intractable shaking.   Contrast Media [iodinated Contrast Media]    Shivering & shaking. Pt reports takes antihistamine prior to procedures.   Metformin And Related Diarrhea   Subsequently discontinued        Medication List     STOP taking these medications    fluconazole 200 MG tablet Commonly known as: DIFLUCAN       TAKE these medications    acetaminophen 500 MG tablet Commonly known as: TYLENOL Take 1,000 mg by mouth every 6 (six) hours as needed for mild pain or headache.   albuterol 108 (90 Base) MCG/ACT inhaler Commonly known as: VENTOLIN HFA Inhale 2 puffs into the lungs every 6 (six) hours as needed for wheezing or shortness of breath.   amLODipine 5 MG tablet Commonly known as: NORVASC Take 1 tablet (5 mg total) by mouth daily. Start taking on: October 23, 2021   ascorbic acid 500 MG tablet Commonly known as: VITAMIN C Take 500 mg by mouth 2 (two) times daily with a meal.   aspirin EC 81 MG tablet Take 81 mg by mouth daily with breakfast.   Brinzolamide-Brimonidine 1-0.2 % Susp Place 1 drop into the right eye 3 (three) times daily.   clopidogrel 75 MG tablet Commonly known as: PLAVIX Take 75 mg by mouth daily.   cyanocobalamin 500 MCG tablet Commonly known as: VITAMIN B12 Take 1 tablet (500 mcg total) by mouth daily. Start taking on: October 23, 2021   doxazosin 8 MG tablet Commonly known as: CARDURA Take 1 tablet (8 mg total) by mouth at bedtime.   finasteride 5 MG tablet Commonly known as: PROSCAR Take 5 mg by mouth daily.   Fish Oil 1000 MG Caps Take 1,000 mg by mouth 2 (two) times daily  with a meal.   gabapentin 300 MG capsule Commonly known as: NEURONTIN Take 600 mg by mouth 3 (three) times daily.   isosorbide mononitrate 60 MG 24 hr tablet Commonly known as: IMDUR Take 1 tablet (60 mg total) by mouth daily. Start taking on: October 23, 2021   nitroGLYCERIN 0.4 MG SL tablet Commonly known as: NITROSTAT Place 1 tablet (0.4 mg total) under the tongue every 5 (five) minutes as needed for up to 25 days for chest pain. And increase blood pressure greater than 419 systolic   omeprazole 20 MG capsule Commonly known as: PRILOSEC Take 20 mg by mouth daily.   prednisoLONE acetate 1 % ophthalmic suspension Commonly known as: PRED FORTE Place 1 drop into the left eye daily.   ranolazine 500 MG 12 hr tablet Commonly known as: RANEXA Take 1 tablet (500 mg total) by mouth 2 (two) times daily.   rosuvastatin 40 MG tablet Commonly known as: CRESTOR TAKE 1 TABLET BY MOUTH DAILY   Tresiba FlexTouch 100 UNIT/ML FlexTouch Pen Generic drug: insulin degludec Inject 30 Units into the skin at bedtime.   Vitamin D 50 MCG (2000 UT) tablet Take 2,000 Units by mouth daily with breakfast.   Voltaren 1 % Gel Generic drug: diclofenac Sodium Apply 2 g topically 4 (four) times daily.        Discharge Assessment:  Vitals:   10/22/21 0427 10/22/21 0812  BP: 127/62 130/79  Pulse: 66 77  Resp: 18 18  Temp: 98.6 F (37 C)   SpO2: 97% 99%   Skin clean, dry and intact without evidence of skin break down, no evidence of skin tears noted. IV catheter discontinued intact. Site without signs and symptoms of complications - no redness or edema noted at insertion site, patient denies c/o pain - only slight tenderness at site.  Dressing with slight pressure applied.  D/c Instructions-Education: Discharge instructions given to patient/family with verbalized understanding. D/c education completed with patient/family including follow up instructions, medication list, d/c activities  limitations if indicated, with other d/c instructions as indicated by MD - patient able to verbalize understanding, all questions fully answered. Patient instructed to return to ED, call 911, or call MD for any changes in condition.  Patient escorted via [place], and D/C home via private auto.  Clovis Fredrickson, LPN [id number] 46:80 AM

## 2021-10-22 NOTE — Discharge Summary (Signed)
Physician Discharge Summary   Patient: [name] MRN: [id number] DOB: Oct 13, 1932  Admit date:     10/21/2021  Discharge date: 10/22/21  Discharge Physician: Shanon Brow Malarie Tappen   PCP: [facility]   Recommendations at discharge:   Please follow up with primary care provider within 1-2 weeks  Please repeat BMP and CBC in one week   Hospital Course: 88 year old male with a history of coronary artery disease status post CABG with redo, hyperlipidemia, hypertension, diabetes mellitus type 2, left vertebral artery stenosis, left subclavian stenosis, and BPH presenting with near syncopal episode.  The patient states that he was getting out of bed in the early morning 10/21/2021 to use the restroom.  After standing up, the patient felt dizzy and lightheaded.  He had to grab a hold of bar on the wall and ease himself down to the floor.  He described having some worsening substernal chest discomfort.  He denied any loss of consciousness.  Subsequently, his wife came to his assistance and called his daughter who lives across the street and activated EMS.  The patient has a Dexcom CGM, and her daughter stated that it was reading in the 250s when she arrived to the check on her dad. Nevertheless, the patient states that he has had numerous low readings on his CGM, usually in the early mornings.  He states that his oral intake is fair, but feels limited in terms of what he can eat because of limitations placed by his physicians.  He denies any fevers, chills, worsening shortness of breath, nausea, vomiting, diarrhea, abdominal pain.  He denies any dysuria, hematuria, hematochezia, melena.  He denies any new medications. Notably, the patient was hospitalized 02/03/2021 to 02/05/2021 for syncope.  He was felt to be vasovagal at that time.  He was discharged home with Florinef.  This has been since discontinued by his PCP. The patient states that he has chest wall pain and chest discomfort 24/7, but he  states that his episode last evening prior to this admission was worse than usual.  He states that his chest discomfort is back to its baseline at the time of my evaluation.  He states that he has been taking gabapentin for the pain. In the ED, the patient was afebrile and hemodynamically stable with oxygen saturation 97% room air.  BMP showed sodium 141, potassium 4.1, bicarbonate 27, serum creatinine 0.88.  WBC 4.6, hemoglobin 12.1, platelets 151,000.  Assessment and Plan: * Near syncope Suspect the patient had some orthostasis resulting in his presentation -Check orthostatics positive with drop 164/57>>144/55 -judicious IV fluids>>clinically improved -Monitor on telemetry--no concerning dysrhythmia -Echo EF 60-65%, no WMA, normal RVF, mild MR  Chest pain The patient has a component of chest wall pain Patient describes chronic chest pain for which he takes Imdur 90 mg twice daily Cardiology consult appreciated>>start ranexa 500 bid, decrease imdur 60 mg bid -Echo EF 60-65%, no WMA, normal RVF, mild MR   Generalized weakness Obtain UA--no pyuria TSH 4.473, FT4--0.75 Magnesium--1.9 B12--145>>replete PT evaluation>>HHPT  Essential hypertension, benign Previously on metoprolol, discontinued after his hospitalization November 2023 due to his orthostatic hypotension -- We will need to tolerate some degree of permissive hypertension -- amlodipine started by cardiology  Uncontrolled type 2 diabetes mellitus with hyperglycemia, with long-term current use of insulin (HCC) 09/30/2021 hemoglobin A1c 7.8 No longer taking Jardiance or Trulicity NovoLog sliding scale Start reduced dose Semglee  Peripheral arterial disease - left vertebral and subclavian artery stenosis status post PTA/stent  Status  post left vertebral stent by Dr. Gwenlyn Found 2005.  Previously done in 2002.    B12 deficiency B12--145 B12 IM x 1 given Start po supplement  BPH (benign prostatic hyperplasia) Continue  finasteride and Cardura  GERD (gastroesophageal reflux disease) Continue PPI  CAD -> CABG x3 then Re-DO CABG x1 (LRad-dLAD) after atretic LIMA & LAD stent occlusion. Continue aspirin and Plavix Cardiology consult  Mixed hyperlipidemia Continue Crestor         Consultants: cardiology Procedures performed: none  Disposition: Home Diet recommendation:  Cardiac and Carb modified diet DISCHARGE MEDICATION: Allergies as of 10/22/2021       Reactions   Iohexol Other (See Comments)   Intractable shaking.   Contrast Media [iodinated Contrast Media]    Shivering & shaking. Pt reports takes antihistamine prior to procedures.   Metformin And Related Diarrhea   Subsequently discontinued        Medication List     STOP taking these medications    fluconazole 200 MG tablet Commonly known as: DIFLUCAN       TAKE these medications    acetaminophen 500 MG tablet Commonly known as: TYLENOL Take 1,000 mg by mouth every 6 (six) hours as needed for mild pain or headache.   albuterol 108 (90 Base) MCG/ACT inhaler Commonly known as: VENTOLIN HFA Inhale 2 puffs into the lungs every 6 (six) hours as needed for wheezing or shortness of breath.   amLODipine 5 MG tablet Commonly known as: NORVASC Take 1 tablet (5 mg total) by mouth daily. Start taking on: October 23, 2021   ascorbic acid 500 MG tablet Commonly known as: VITAMIN C Take 500 mg by mouth 2 (two) times daily with a meal.   aspirin EC 81 MG tablet Take 81 mg by mouth daily with breakfast.   Brinzolamide-Brimonidine 1-0.2 % Susp Place 1 drop into the right eye 3 (three) times daily.   clopidogrel 75 MG tablet Commonly known as: PLAVIX Take 75 mg by mouth daily.   cyanocobalamin 500 MCG tablet Commonly known as: VITAMIN B12 Take 1 tablet (500 mcg total) by mouth daily. Start taking on: October 23, 2021   doxazosin 8 MG tablet Commonly known as: CARDURA Take 1 tablet (8 mg total) by mouth at bedtime.    finasteride 5 MG tablet Commonly known as: PROSCAR Take 5 mg by mouth daily.   Fish Oil 1000 MG Caps Take 1,000 mg by mouth 2 (two) times daily with a meal.   gabapentin 300 MG capsule Commonly known as: NEURONTIN Take 600 mg by mouth 3 (three) times daily.   isosorbide mononitrate 60 MG 24 hr tablet Commonly known as: IMDUR Take 1 tablet (60 mg total) by mouth daily. Start taking on: October 23, 2021   nitroGLYCERIN 0.4 MG SL tablet Commonly known as: NITROSTAT Place 1 tablet (0.4 mg total) under the tongue every 5 (five) minutes as needed for up to 25 days for chest pain. And increase blood pressure greater than 762 systolic   omeprazole 20 MG capsule Commonly known as: PRILOSEC Take 20 mg by mouth daily.   prednisoLONE acetate 1 % ophthalmic suspension Commonly known as: PRED FORTE Place 1 drop into the left eye daily.   ranolazine 500 MG 12 hr tablet Commonly known as: RANEXA Take 1 tablet (500 mg total) by mouth 2 (two) times daily.   rosuvastatin 40 MG tablet Commonly known as: CRESTOR TAKE 1 TABLET BY MOUTH DAILY   Tresiba FlexTouch 100 UNIT/ML FlexTouch Pen Generic drug: insulin  degludec Inject 30 Units into the skin at bedtime.   Vitamin D 50 MCG (2000 UT) tablet Take 2,000 Units by mouth daily with breakfast.   Voltaren 1 % Gel Generic drug: diclofenac Sodium Apply 2 g topically 4 (four) times daily.        Discharge Exam: Filed Weights   10/21/21 0237 10/22/21 0427  Weight: 55.9 kg 53.8 kg  HEENT:  [place]/AT, No thrush, no icterus CV:  RRR, no rub, no S3, no S4 Lung:  CTA, no wheeze, no rhonchi Abd:  soft/+BS, NT Ext:  No edema, no lymphangitis, no synovitis, no rash   Condition at discharge: stable  The results of significant diagnostics from this hospitalization (including imaging, microbiology, ancillary and laboratory) are listed below for reference.   Imaging Studies: ECHOCARDIOGRAM COMPLETE  Result Date: 10/21/2021    ECHOCARDIOGRAM  REPORT   Patient Name:   TRONG GOSLING Date of Exam: 10/21/2021 Medical Rec #:  [id number]       Height:       66.0 in Accession #:    [id number]      Weight:       123.2 lb Date of Birth:  June 14, 1932       BSA:          1.628 m Patient Age:    88 years        BP:           171/93 mmHg Patient Gender: M               HR:           58 bpm. Exam Location:  [facility] Procedure: 2D Echo, Cardiac Doppler and Color Doppler Indications:    Syncope R55  History:        Patient has prior history of Echocardiogram examinations, most                 recent 02/04/2021. CAD, Prior CABG; Risk Factors:Hypertension,                 Diabetes and Dyslipidemia.  Sonographer:    Alvino Chapel RCS Referring Phys: [phone] Alexsus Papadopoulos IMPRESSIONS  1. Left ventricular ejection fraction, by estimation, is 60 to 65%. The left ventricle has normal function. The left ventricle has no regional wall motion abnormalities. Left ventricular diastolic parameters were normal.  2. Right ventricular systolic function is normal. The right ventricular size is normal.  3. Left atrial size was mildly dilated.  4. The mitral valve is normal in structure. Mild mitral valve regurgitation. No evidence of mitral stenosis.  5. The aortic valve is tricuspid. Aortic valve regurgitation is trivial. No aortic stenosis is present.  6. The inferior vena cava is normal in size with greater than 50% respiratory variability, suggesting right atrial pressure of 3 mmHg. FINDINGS  Left Ventricle: Left ventricular ejection fraction, by estimation, is 60 to 65%. The left ventricle has normal function. The left ventricle has no regional wall motion abnormalities. The left ventricular internal cavity size was normal in size. There is  no left ventricular hypertrophy. Left ventricular diastolic parameters were normal. Right Ventricle: The right ventricular size is normal. No increase in right ventricular wall thickness. Right ventricular systolic function is normal. Left Atrium: Left  atrial size was mildly dilated. Right Atrium: Right atrial size was normal in size. Pericardium: There is no evidence of pericardial effusion. Mitral Valve: The mitral valve is normal in structure. Mild mitral valve regurgitation. No evidence of  mitral valve stenosis. Tricuspid Valve: The tricuspid valve is normal in structure. Tricuspid valve regurgitation is mild . No evidence of tricuspid stenosis. Aortic Valve: The aortic valve is tricuspid. Aortic valve regurgitation is trivial. Aortic regurgitation PHT measures 797 msec. No aortic stenosis is present. Aortic valve mean gradient measures 7.0 mmHg. Aortic valve peak gradient measures 13.5 mmHg. Aortic valve area, by VTI measures 1.78 cm. Pulmonic Valve: The pulmonic valve was normal in structure. Pulmonic valve regurgitation is trivial. No evidence of pulmonic stenosis. Aorta: The aortic root is normal in size and structure. Venous: The inferior vena cava is normal in size with greater than 50% respiratory variability, suggesting right atrial pressure of 3 mmHg. IAS/Shunts: The interatrial septum was not well visualized.  LEFT VENTRICLE PLAX 2D LVIDd:         4.40 cm   Diastology LVIDs:         2.30 cm   LV e' medial:    4.79 cm/s LV PW:         1.00 cm   LV E/e' medial:  18.0 LV IVS:        1.10 cm   LV e' lateral:   9.57 cm/s LVOT diam:     1.90 cm   LV E/e' lateral: 9.0 LV SV:         85 LV SV Index:   52 LVOT Area:     2.84 cm  RIGHT VENTRICLE RV S prime:     9.68 cm/s TAPSE (M-mode): 2.0 cm LEFT ATRIUM             Index        RIGHT ATRIUM           Index LA diam:        3.70 cm 2.27 cm/m   RA Area:     15.70 cm LA Vol (A2C):   43.0 ml 26.42 ml/m  RA Volume:   38.10 ml  23.41 ml/m LA Vol (A4C):   58.7 ml 36.06 ml/m LA Biplane Vol: 53.2 ml 32.69 ml/m  AORTIC VALVE AV Area (Vmax):    1.91 cm AV Area (Vmean):   1.87 cm AV Area (VTI):     1.78 cm AV Vmax:           184.00 cm/s AV Vmean:          122.000 cm/s AV VTI:            0.475 m AV Peak Grad:       13.5 mmHg AV Mean Grad:      7.0 mmHg LVOT Vmax:         124.00 cm/s LVOT Vmean:        80.400 cm/s LVOT VTI:          0.299 m LVOT/AV VTI ratio: 0.63 AI PHT:            797 msec  AORTA Ao Root diam: 3.20 cm MITRAL VALVE               TRICUSPID VALVE MV Area (PHT): 2.91 cm    TR Peak grad:   27.9 mmHg MV Decel Time: 261 msec    TR Vmax:        264.00 cm/s MV E velocity: 86.30 cm/s MV A velocity: 79.80 cm/s  SHUNTS MV E/A ratio:  1.08        Systemic VTI:  0.30 m  Systemic Diam: 1.90 cm Vishnu Priya Mallipeddi Electronically signed by Lorelee Cover Mallipeddi Signature Date/Time: 10/21/2021/4:38:59 PM    Final    DG Chest Portable 1 View  Result Date: 10/21/2021 CLINICAL DATA:  Chest pain EXAM: PORTABLE CHEST 1 VIEW COMPARISON:  06/10/2021 FINDINGS: Prior CABG. Biapical scarring. No acute confluent opacities or effusions. No acute bony abnormality. IMPRESSION: No active disease. Electronically Signed   By: Rolm Baptise M.D.   On: 10/21/2021 03:10    Microbiology: Results for orders placed or performed during the hospital encounter of 07/07/21  KOH prep     Status: None   Collection Time: 07/07/21  9:34 AM   Specimen: PATH Cytology brushing; Body Fluid  Result Value Ref Range Status   Specimen Description BRONCHIAL BRUSHING  Final   Special Requests NONE  Final   KOH Prep   Final    YEAST Performed at [facility], [address], [place], [place] [address]    Report Status 07/07/2021 FINAL  Final    Labs: CBC: Recent Labs  Lab 10/21/21 0348  WBC 4.6  HGB 12.1*  HCT 35.6*  MCV 90.6  PLT 950   Basic Metabolic Panel: Recent Labs  Lab 10/21/21 0347 10/21/21 0348 10/21/21 0556 10/22/21 0456  NA  --  141  --  141  K  --  4.1  --  3.8  CL  --  111  --  109  CO2  --  27  --  24  GLUCOSE  --  192*  --  119*  BUN  --  16  --  14  CREATININE  --  0.88  --  0.76  CALCIUM  --  8.8*  --  9.1  MG  --   --  1.9 1.8  PHOS 2.5  --   --   --    Liver  Function Tests: No results for input(s): "AST", "ALT", "ALKPHOS", "BILITOT", "PROT", "ALBUMIN" in the last 168 hours. CBG: Recent Labs  Lab 10/21/21 0754 10/21/21 1212 10/21/21 1645 10/21/21 2235 10/22/21 0815  GLUCAP 92 145* 103* 133* 84    Discharge time spent: greater than 30 minutes.  Signed: Orson Eva, MD [facility] 10/22/2021

## 2021-10-22 NOTE — Assessment & Plan Note (Signed)
B12--145 B12 IM x 1 given Start po supplement

## 2021-10-29 DIAGNOSIS — Z87898 Personal history of other specified conditions: Secondary | ICD-10-CM | POA: Diagnosis not present

## 2021-10-29 DIAGNOSIS — I10 Essential (primary) hypertension: Secondary | ICD-10-CM | POA: Diagnosis not present

## 2021-10-29 DIAGNOSIS — E1169 Type 2 diabetes mellitus with other specified complication: Secondary | ICD-10-CM | POA: Diagnosis not present

## 2021-10-29 DIAGNOSIS — Z6821 Body mass index (BMI) 21.0-21.9, adult: Secondary | ICD-10-CM | POA: Diagnosis not present

## 2021-11-01 ENCOUNTER — Ambulatory Visit (HOSPITAL_COMMUNITY)
Admission: RE | Admit: 2021-11-01 | Discharge: 2021-11-01 | Disposition: A | Discharge status: Home / Self Care | Payer: Medicare Other | Source: Ambulatory Visit | Attending: Internal Medicine | Admitting: Internal Medicine

## 2021-11-01 DIAGNOSIS — I6523 Occlusion and stenosis of bilateral carotid arteries: Secondary | ICD-10-CM | POA: Insufficient documentation

## 2021-11-06 NOTE — Progress Notes (Unsigned)
Cardiology Office Note:    Date:  11/10/2021   ID:  [name], DOB May 17, 1932, MRN [id number]  PCP:  [facility]  Cardiologist:  Glenetta Hew, MD  Electrophysiologist:  None   Referring MD: [facility]   Chief Complaint: hospital follow-up of near syncope and chest pain  History of Present Illness:    [name] is a 88 y.o. male with a history of CAD s/p CABG x3 (LIMA to LAD, SVG to OM2, SVG to RPDA) in 1995 and then re-do CABG x1 (left radial graft to distal LAD due to atretic LIMA and failed attempt at revascularization) in 06/1999, PAD  s/p stenting of left vertebral artery in 2002 and then re-do stent in 2005 and stenting of left subclavian artery in 2005, vasovagal syncope, hypertension with orthostatic hypotension, hyperlipidemia, type 2 diabetes mellitus, and glaucoma who is followed by Dr. Ellyn Hack and presents today for hospital follow-up of near syncope and chest pain.   Patient has a long history of CAD as above. He underwent CABG x3 with LIMA to LAD, SVG to OM2, SVG to RPDA in 1995 and then required redo CABG x1 with left radial graft to distal LAD due to an atretic LIMA and failed attempt at revascularization. Last ischemic evaluation was a cardiac catheterization in 2019 which showed severe native CAD with patent grafts. Continued medical therapy was recommended. He also has a history of PAD s/p stenting of left vertebral artery in 2002 and then again in 2005 as well as stenting of left subclavian artery in 2005.  Patient was admitted in 02/2021 with syncope that occurred when he went to bathroom to have a bowel movement and then stood up. Echo showed LVEF of 70-75% with grade 1 diastolic dysfunction and no significant valvular disease. Chest CTA was negative for PE. Syncope was felt to be vasovagal in nature due to difficulty streaming urine in the presence of BPH. Home Amlodipine was stopped but his Doxazosin and Finasteride were continued.   He was seen by Dr. Ellyn Hack for follow-up in 03/2021 at which time there is also mention that he was noted to have orthostatic hypotension during recent hospitalization and was started on Florinef (although this was not specifically mentioned in discharge summary). At that visit with Dr. Ellyn Hack, he denied any recurrent syncope but was still somewhat orthostatic with dizziness.[name] He also had lost an additional 10 lbs since his hospitalization. He was educated on the importance of staying well hydrated.   Since last visit he was recently admitted from 10/21/2021 to 10/22/2021 for near syncope that occurred after getting up from bed to use the restroom. Patient reported he urinated and then became acutely dizzy an called out to his wife. He never actually loss consciousness. He was no longer taking Florinef as PCP had discontinued this due to elevated BP. Orthostatics were positive with BP dropping from 164/57 when supine to 144/55 when standing. He was treated with IV fluids. Otherwise, work-up was unremarkable. Echo showed LVEF of 60-65% with no regional wall motion abnormalities and norma diastolic parameters, normal RV, and mild MR. He also reported some "regular" chest pain that he has had since his CABG and was felt to be non-cardiac as well as some "bad" chest pain that did sound cardiac. Therefore, he was started on Ranexa 500mg  twice daily and Imdur was decreased to 60mg  daily. He was also restarted on Amlodipine 5mg  daily given poorly controlled hypertension at baseline.  Patient presents today for follow-up.  Overall, patient is doing well since discharge.  He denies any recurrent dizziness or near syncope.  He also reports improvement in his chest pain.  He denies any recurrent episodes of the "bad/severe" chest pain with the addition of Ranexa and Amlodipine.  He continues to have his "usual" chest pain around his sternotomy site.  He has had this since the time of his last CABG.  It is almost constant,  not worse with exertions, and improves some with Gabapentin.  He denies any shortness of breath, PND, orthopnea, or edema.  No palpitations.  He continues to have issues with his blood sugar being very labile - ranging from the 30s to 200s. He states if his blood sugar drops below 30 he will get diaphoretic and pass out. He estimates this has happened 4-5 times in the past but none since recent discharge. This is being followed by both his PCP and Endocrinology.   Past Medical History:  Diagnosis Date   Arthritis    Asymptomatic stenosis of left vertebral artery 2002, 2005   Status post PTA/stent with redo; normal antegrade flow on dopplers 09/2013   CAD (coronary artery disease) 1995   1CABG x3; redo in CABG x 1 2001 Free Radial to LAD; All grafts patent by Cath 08/2014: The proximal to mid LAD has poor retrograde filling from the LIMA due to in-stent restenosis. 50% ostial disease of free radial artery to the LAD.   CAD (coronary artery disease) of artery bypass graft 2000   PCI x 2 - ostial & prox-mid LAD (BMS) for atretic LIMA-LAD   CAD in native artery 1995   CABG x 3 - LIMA-LAD, SVG-OM2, SVG-rPDA   Chest pain 09/2017   GERD (gastroesophageal reflux disease)    Glaucoma    Hyperlipidemia    Hypertension, essential    S/P CABG x 3 1995   S/P Redo CABG x 1 2001   L Radial-LAD after 2 failed attempts @ LIMA-LAD PTCA; LAD stents 100% occluded   Stenosis of left subclavian artery ([place]) 11/2003   Status post PTA/stent --> < 50 % stenosis by Dopplers 09/2013   Type 2 diabetes mellitus ([place]) 2002    Past Surgical History:  Procedure Laterality Date   BALLOON DILATION N/A 07/07/2021   Procedure: BALLOON DILATION;  Surgeon: Eloise Harman, DO;  [facility];  Service: Endoscopy;  Laterality: N/A;   CARDIAC CATHETERIZATION N/A 08/04/2014   Procedure: Left Heart Cath and Cors/Grafts Angiography;  Surgeon: Leonie Man, MD;  Location: [facility] [place] [facility];  Service: Cardiovascular:  o-mRCA 70%, m-dRCA ~70% - SVG-dRCA Patent.  o-pLAD 100% stent occluded, mLAD 95% ISR with poor retrograde filling from freeRadiial graft-LAD (50% ostial graft dz).  o-p Cx 80%. Widely patent SVG-Cx-OM fills retrograde to 80% lesion.; Normal LV Fxn & EDP   CORONARY ANGIOPLASTY  April and May 2001   After Both LAD stents occluded - PTCA of anastomatic LIMA-LAD lesion -- Unsuccessful.   CORONARY ARTERY BYPASS GRAFT  1995   LIMA-LAD, SVG-OM2, SVG-rPDA   CORONARY ARTERY BYPASS GRAFT  06/1999   Dr. Lucianne Lei Trigt: Redo LAD grafting with free Left Radial-distal LAD   CORONARY STENT PLACEMENT  1995-2000   2 BMS stents to osital-proximal & proximal-mid LAD; because of atretic LIMA-LDA   ESOPHAGEAL BRUSHING  07/07/2021   Procedure: ESOPHAGEAL BRUSHING;  Surgeon: Eloise Harman, DO;  [facility];  Service: Endoscopy;;   ESOPHAGOGASTRODUODENOSCOPY (EGD) WITH PROPOFOL N/A 07/07/2021   Procedure: ESOPHAGOGASTRODUODENOSCOPY (  EGD) WITH PROPOFOL;  Surgeon: Eloise Harman, DO;  [facility];  Service: Endoscopy;  Laterality: N/A;  9:15am   LEFT HEART CATH AND CORONARY ANGIOGRAPHY N/A 09/18/2017   Procedure: LEFT HEART CATH AND CORONARY ANGIOGRAPHY;  Surgeon: Martinique, Peter M, MD;  Location: [place] [facility];  Service: Cardiovascular;  Laterality: N/A;   LEFT HEART CATHETERIZATION WITH CORONARY/GRAFT ANGIOGRAM N/A 08/22/2011   Procedure: LEFT HEART CATHETERIZATION WITH Beatrix Fetters;  Surgeon: Leonie Man, MD;  Location[facility];  Service: Cardiovascular:  Known occluded LIMA-LAD and ostial LAD. Moderate to severe proximal circumflex and RCA disease. Widely patent freeLRAD-dLAD, as well as SVG-RPDA (backfilling RPL), SVG-OM 2 (backfilling OM1)   SHOULDER OPEN ROTATOR CUFF REPAIR  10/2002   Dr. Noemi Chapel   SP [facility] VERT OR THOR CAROTID STENT Left October 2002; November 2005   Left Vertebral stent placed in October 2002 (Dr. Patrecia Pour); redo PCI in 2005   [place] Left 11/2003   Dr. Gwenlyn Found   TRANSTHORACIC ECHOCARDIOGRAM  04/10/2020   [facility]): Normal LV size and function.  EF 60 to 65%.  GR 1 DD.  Mild AI.  Mild to moderate LA dilation.  GR 1 DD.[name]   TRANSTHORACIC ECHOCARDIOGRAM  02/2021   EF 70 to 75%.  Hyperdynamic.  No RWMA.  GR 1 DD.  Normal RV, RVP and low normal RAP.  Normal valves.    Current Medications: Current Meds  Medication Sig   acetaminophen (TYLENOL) 500 MG tablet Take 1,000 mg by mouth every 6 (six) hours as needed for mild pain or headache.   albuterol (VENTOLIN HFA) 108 (90 Base) MCG/ACT inhaler Inhale 2 puffs into the lungs every 6 (six) hours as needed for wheezing or shortness of breath.   amLODipine (NORVASC) 5 MG tablet Take 1 tablet (5 mg total) by mouth daily.   aspirin EC 81 MG tablet Take 81 mg by mouth daily with breakfast.    Brinzolamide-Brimonidine 1-0.2 % SUSP Place 1 drop into the right eye 3 (three) times daily.   Cholecalciferol (VITAMIN D) 2000 units tablet Take 2,000 Units by mouth daily with breakfast.   clopidogrel (PLAVIX) 75 MG tablet Take 75 mg by mouth daily.   diclofenac Sodium (VOLTAREN) 1 % GEL Apply 2 g topically 4 (four) times daily.   doxazosin (CARDURA) 8 MG tablet Take 1 tablet (8 mg total) by mouth at bedtime.   finasteride (PROSCAR) 5 MG tablet Take 5 mg by mouth daily.   gabapentin (NEURONTIN) 300 MG capsule Take 600 mg by mouth 3 (three) times daily.   isosorbide mononitrate (IMDUR) 60 MG 24 hr tablet Take 1 tablet (60 mg total) by mouth daily.   Omega-3 Fatty Acids (FISH OIL) 1000 MG CAPS Take 1,000 mg by mouth 2 (two) times daily with a meal.    omeprazole (PRILOSEC) 20 MG capsule Take 20 mg by mouth daily.   prednisoLONE acetate (PRED FORTE) 1 % ophthalmic suspension Place 1 drop into the left eye daily.   ranolazine (RANEXA) 500 MG 12 hr tablet Take 1 tablet (500 mg total) by mouth 2 (two) times daily.   rosuvastatin (CRESTOR) 40 MG tablet TAKE 1 TABLET BY MOUTH DAILY   TRESIBA  FLEXTOUCH 100 UNIT/ML FlexTouch Pen Inject 30 Units into the skin at bedtime.   vitamin B-12 (VITAMIN B12) 500 MCG tablet Take 1 tablet (500 mcg total) by mouth daily.   vitamin C (ASCORBIC ACID) 500 MG tablet Take 500 mg by mouth 2 (  two) times daily with a meal.      Allergies:   Iohexol, Contrast media [iodinated contrast media], and Metformin and related   Social History   Socioeconomic History   Marital status: Married    Spouse name: Not on file   Number of children: Not on file   Years of education: Not on file   Highest education level: Not on file  Occupational History   Not on file  Tobacco Use   Smoking status: Former    Packs/day: 3.00    Years: 30.00    Total pack years: 90.00    Types: Cigarettes   Smokeless tobacco: Never   Tobacco comments:    quit smoking about 50 years ago  Vaping Use   Vaping Use: Never used  Substance and Sexual Activity   Alcohol use: No   Drug use: No   Sexual activity: Not Currently  Other Topics Concern   Not on file  Social History Narrative   He is married with one daughter. He has 2 grandchildren.   He does not smoke. He quit smoking in roughly 1970 after smoking 3 packs per day.   He is routinely at give him. It does not necessarily do routine exercise.    Social Determinants of Health   Financial Resource Strain: Not on file  Food Insecurity: No Food Insecurity (10/21/2021)   Hunger Vital Sign    Worried About Running Out of Food in the Last Year: Never true    Ran Out of Food in the Last Year: Never true  Transportation Needs: No Transportation Needs (10/21/2021)   PRAPARE - Hydrologist (Medical): No    Lack of Transportation (Non-Medical): No  Physical Activity: Not on file  Stress: Not on file  Social Connections: Not on file     Family History: The patient's family history includes Diabetes in his brother, mother, and sister.  ROS:   Please see the history of present illness.      EKGs/Labs/Other Studies Reviewed:    The following studies were reviewed:  Left Cardiac Catheterization 09/18/2017: Prox RCA to Mid RCA lesion is 80% stenosed. Dist RCA lesion is 50% stenosed. SVG graft was visualized by angiography and is normal in caliber. Ost LM to Dist LM lesion is 90% stenosed. Ost Cx to Prox Cx lesion is 90% stenosed. Ost LAD to Prox LAD lesion is 100% stenosed. Prox LAD to Mid LAD lesion is 100% stenosed. SVG graft was visualized by angiography and is normal in caliber. The graft exhibits no disease. Left radial artery graft was visualized by angiography and is normal in caliber. The graft exhibits no disease. The left ventricular systolic function is normal. LV end diastolic pressure is normal. The left ventricular ejection fraction is greater than 65% by visual estimate.   1. Severe left main and 3 vessel obstructive CAD 2. Patent free radial graft to the LAD 3. Patent SVG to OM1 4. Patent SVG to distal RCA 5. Normal LV function 6. Normal LVEDP   Plan: continue medical therapy. I do not see a coronary cause for his chest pain.   Diagnostic Dominance: Right     _______________  Echocardiogram 10/21/2021: Impressions:  1. Left ventricular ejection fraction, by estimation, is 60 to 65%. The  left ventricle has normal function. The left ventricle has no regional  wall motion abnormalities. Left ventricular diastolic parameters were  normal.   2. Right ventricular systolic function is normal. The right ventricular  size is normal.   3. Left atrial size was mildly dilated.   4. The mitral valve is normal in structure. Mild mitral valve  regurgitation. No evidence of mitral stenosis.   5. The aortic valve is tricuspid. Aortic valve regurgitation is trivial.  No aortic stenosis is present.   6. The inferior vena cava is normal in size with greater than 50%  respiratory variability, suggesting right atrial pressure of 3 mmHg.   _______________  Carotid Ultrasound 11/01/2021: Summary: Right Carotid: Velocities in the right ICA are consistent with a 1-39%  stenosis.   Left Carotid: Velocities in the left ICA are consistent with a 1-39%  stenosis.   Vertebrals: Left vertebral artery demonstrates antegrade flow. Right  vertebral              artery demonstrates antegrade high resistant flow.  Subclavians: Normal flow hemodynamics were seen in bilateral subclavian               arteries.    EKG:  EKG ordered today. EKG personally reviewed and demonstrates normal sinus rhythm, rate 68 bpm, with LVH and no significant ST/T changes compared to prior tracings. Normal PR and QRS intervals. QTc 421 ms.   Recent Labs: 02/04/2021: ALT 22 10/21/2021: Hemoglobin 12.1; Platelets 151; TSH 4.473 10/22/2021: BUN 14; Creatinine, Ser 0.76; Magnesium 1.8; Potassium 3.8; Sodium 141  Recent Lipid Panel    Component Value Date/Time   CHOL 108 07/21/2021 0000   TRIG 125 07/21/2021 0000   HDL 36 07/21/2021 0000   CHOLHDL 4.5 09/18/2017 0543   VLDL 22 09/18/2017 0543   LDLCALC 50 07/21/2021 0000    Physical Exam:    Vital Signs: BP (!) 142/48 (BP Location: Left Arm, Patient Position: Sitting)   Pulse 67   Ht 5\' 5"  (1.651 m)   Wt 128 lb 6.4 oz (58.2 kg)   SpO2 96%   BMI 21.37 kg/m     Wt Readings from Last 3 Encounters:  11/10/21 128 lb 6.4 oz (58.2 kg)  10/22/21 118 lb 9.7 oz (53.8 kg)  09/30/21 123 lb 3.2 oz (55.9 kg)     General: 88 y.o. thin Caucasian male in no acute distress. HEENT: Normocephalic and atraumatic. Sclera clear.  Neck: Supple. Left carotid bruit noted. No JVD. Heart: RRR. Distinct S1 and S2. II-III/VI systolic murmur. Radial and distal pedal pulses 2+ and equal bilaterally. Lungs: No increased work of breathing. Clear to ausculation bilaterally. No wheezes, rhonchi, or rales.  Abdomen: Soft, non-distended, and non-tender to palpation.  Extremities: No lower extremity edema.    Skin: Warm and  dry. Neuro: Alert and oriented x3. No focal deficits. Psych: Normal affect. Responds appropriately.  Assessment:    1. Coronary artery disease involving native coronary artery of native heart without angina pectoris   2. Mild mitral regurgitation   3. PAD (peripheral artery disease) ([place])   4. Primary hypertension   5. Orthostatic hypotension   6. Near syncope   7. Hyperlipidemia, unspecified hyperlipidemia type   8. Type 2 diabetes mellitus with complication, with long-term current use of insulin (HCC)     Plan:    CAD S/p CABG x3 (LIMA to LAD, SVG to OM2, SVG to RPDA) in 1995 and then re-do CABG x1 (left radial graft to distal LAD due to atretic LIMA and failed attempt at revascularization) in 06/1999. Last cardiac catheterization in 2019 showed patent grafts. He was recently admitted with near syncope and also reported some both cardiac and non-cardiac chest  pain. Antianginals were adjusted. - He denies any recurrent "bad/severe" chest pain that was considered cardiac in nature since addition of Amlodipine and Ranexa. He continues to have his "usual" atypical chest pain that he has had since his most recent CABG and was felt to be non-cardiac in nature during recent admission.  - EKG today shows normal sinus rhythm with no significant ST/T changes compared to prior tracings and QTc of 421 ms. - Continue DAPT with Aspirin and Plavix. - Continue antianginals: Imdur 60mg  daily, Amlodipine 5mg  daily, and Ranexa 500mg  twice daily. Previously on beta-blocker but this was stopped due to orthostatic hypotension. - Continue high-sensitivity statin.  Mild Mitral Regurgitation Noted on Echo during recent admission. - Soft murmur on exam. - Can continue routine serial Echos as an outpatient. Consider repeat Echo in 3-5 years or sooner if needed based on symptoms.   PAD (Vertebral and Subclavian Artery Stenosis) S/p stenting of left vertebral artery in 2002 and again in 2005. Also s/p left  subclavian artery stenting in 2005. Last carotid dopplers in 10/2021 mild disease (1-39% stenosis) of bilateral ICAs and high-resistant flow of right vertebral artery but normal flow through left vertebral artery and bilateral subclavian arteries. Stable from prior tracings.  - Continue DAPT and statin therapy.  Hypertension Orthostatic Hypotension History of baseline hypertension complicated by orthostatic hypotension. Recently admitted with near syncope and was found to be orthostatic and was treated with IV fluids. - BP mildly elevated in the office. Initially 144/58 and then 142/48 on my personal recheck at the end of visit. - Continue current medications: Amlodipine 5mg  daily and Imdur 60mg  daily. Also on Cardura and Finasteride for BPH. - Given history of orthostatic hypotension and multiple episodes of near syncope, OK with mild permissive hypertension. Continue wearing compressions stocks to help with orthostatic symptoms.   Near Syncope Recently admitted with near syncope that occurred after getting up from bed and trying to urinate. He was found to be borderline orthostatic. He was treated with IV fluids. Echo showed normal LV function with no significant valvular disease.  - No recurrence. - See recommendations regarding orthostatic hypotension above.  Hyperlipidemia Lipid panel in 07/2021: Total Cholesterol 125, Triglycerides 108, HDL 36, LDL 50. LDL goal <55. - Continue Crestor 40mg  daily.  Type 2 Diabetes Mellitus Hemoglobin 7.8% in 09/2021. - He continues to have labile blood sugars ranging from 30s to 200s.  - On Tresiba at home.  - Management per PCP and Endocrinology.  Disposition: Follow up in 3-4 months.   Medication Adjustments/Labs and Tests Ordered: Current medicines are reviewed at length with the patient today.  Concerns regarding medicines are outlined above.  Orders Placed This Encounter  Procedures   EKG 12-Lead   No orders of the defined types were placed  in this encounter.   Patient Instructions  Medication Instructions:  The current medical regimen is effective;  continue present plan and medications as directed. Please refer to the Current Medication list given to you today.  *If you need a refill on your cardiac medications before your next appointment, please call your pharmacy*  Lab Work: NONE  Testing/Procedures: NONE  Follow-Up: At [facility], you and your health needs are our priority.  As part of our continuing mission to provide you with exceptional heart care, we have created designated Provider Care Teams.  These Care Teams include your primary Cardiologist (physician) and Advanced Practice Providers (APPs -  Physician Assistants and Nurse Practitioners) who all work together to provide  you with the care you need, when you need it.  We recommend signing up for the patient portal called "MyChart".  Sign up information is provided on this After Visit Summary.  MyChart is used to connect with patients for Virtual Visits (Telemedicine).  Patients are able to view lab/test results, encounter notes, upcoming appointments, etc.  Non-urgent messages can be sent to your provider as well.   To learn more about what you can do with MyChart, go to [email or web address].    Your next appointment:   3-4 month(s)  The format for your next appointment:   In Person  Provider:   Glenetta Hew, MD   Other Instructions   Important Information About Sugar         Signed, Darreld Mclean, PA-C  11/10/2021 10:23 PM    [place]

## 2021-11-08 DIAGNOSIS — Z87898 Personal history of other specified conditions: Secondary | ICD-10-CM | POA: Diagnosis not present

## 2021-11-08 DIAGNOSIS — Z23 Encounter for immunization: Secondary | ICD-10-CM | POA: Diagnosis not present

## 2021-11-08 DIAGNOSIS — D696 Thrombocytopenia, unspecified: Secondary | ICD-10-CM | POA: Diagnosis not present

## 2021-11-08 DIAGNOSIS — E1169 Type 2 diabetes mellitus with other specified complication: Secondary | ICD-10-CM | POA: Diagnosis not present

## 2021-11-08 DIAGNOSIS — I10 Essential (primary) hypertension: Secondary | ICD-10-CM | POA: Diagnosis not present

## 2021-11-09 ENCOUNTER — Ambulatory Visit: Payer: Medicare Other | Admitting: Gastroenterology

## 2021-11-09 ENCOUNTER — Encounter: Payer: Self-pay | Admitting: Student

## 2021-11-10 ENCOUNTER — Ambulatory Visit: Payer: Medicare Other | Attending: Student | Admitting: Student

## 2021-11-10 ENCOUNTER — Encounter: Payer: Self-pay | Admitting: Student

## 2021-11-10 VITALS — BP 142/48 | HR 67 | Ht 65.0 in | Wt 128.4 lb

## 2021-11-10 DIAGNOSIS — I10 Essential (primary) hypertension: Secondary | ICD-10-CM

## 2021-11-10 DIAGNOSIS — I340 Nonrheumatic mitral (valve) insufficiency: Secondary | ICD-10-CM

## 2021-11-10 DIAGNOSIS — I2510 Atherosclerotic heart disease of native coronary artery without angina pectoris: Secondary | ICD-10-CM | POA: Diagnosis not present

## 2021-11-10 DIAGNOSIS — Z794 Long term (current) use of insulin: Secondary | ICD-10-CM

## 2021-11-10 DIAGNOSIS — I951 Orthostatic hypotension: Secondary | ICD-10-CM | POA: Diagnosis not present

## 2021-11-10 DIAGNOSIS — E118 Type 2 diabetes mellitus with unspecified complications: Secondary | ICD-10-CM | POA: Diagnosis not present

## 2021-11-10 DIAGNOSIS — E785 Hyperlipidemia, unspecified: Secondary | ICD-10-CM | POA: Diagnosis not present

## 2021-11-10 DIAGNOSIS — R55 Syncope and collapse: Secondary | ICD-10-CM | POA: Diagnosis not present

## 2021-11-10 DIAGNOSIS — I739 Peripheral vascular disease, unspecified: Secondary | ICD-10-CM | POA: Diagnosis not present

## 2021-11-10 NOTE — Patient Instructions (Signed)
Medication Instructions:  The current medical regimen is effective;  continue present plan and medications as directed. Please refer to the Current Medication list given to you today.  *If you need a refill on your cardiac medications before your next appointment, please call your pharmacy*  Lab Work: NONE  Testing/Procedures: NONE  Follow-Up: At [facility], you and your health needs are our priority.  As part of our continuing mission to provide you with exceptional heart care, we have created designated Provider Care Teams.  These Care Teams include your primary Cardiologist (physician) and Advanced Practice Providers (APPs -  Physician Assistants and Nurse Practitioners) who all work together to provide you with the care you need, when you need it.  We recommend signing up for the patient portal called "MyChart".  Sign up information is provided on this After Visit Summary.  MyChart is used to connect with patients for Virtual Visits (Telemedicine).  Patients are able to view lab/test results, encounter notes, upcoming appointments, etc.  Non-urgent messages can be sent to your provider as well.   To learn more about what you can do with MyChart, go to [email or web address].    Your next appointment:   3-4 month(s)  The format for your next appointment:   In Person  Provider:   Glenetta Hew, MD   Other Instructions   Important Information About Sugar

## 2021-11-21 DIAGNOSIS — Z87898 Personal history of other specified conditions: Secondary | ICD-10-CM | POA: Diagnosis not present

## 2021-11-21 DIAGNOSIS — Z6821 Body mass index (BMI) 21.0-21.9, adult: Secondary | ICD-10-CM | POA: Diagnosis not present

## 2021-11-21 DIAGNOSIS — I10 Essential (primary) hypertension: Secondary | ICD-10-CM | POA: Diagnosis not present

## 2021-11-21 DIAGNOSIS — E1169 Type 2 diabetes mellitus with other specified complication: Secondary | ICD-10-CM | POA: Diagnosis not present

## 2021-11-26 DIAGNOSIS — I129 Hypertensive chronic kidney disease with stage 1 through stage 4 chronic kidney disease, or unspecified chronic kidney disease: Secondary | ICD-10-CM | POA: Diagnosis not present

## 2021-11-26 DIAGNOSIS — E876 Hypokalemia: Secondary | ICD-10-CM | POA: Diagnosis not present

## 2021-11-26 DIAGNOSIS — E785 Hyperlipidemia, unspecified: Secondary | ICD-10-CM | POA: Diagnosis not present

## 2021-11-26 DIAGNOSIS — E11649 Type 2 diabetes mellitus with hypoglycemia without coma: Secondary | ICD-10-CM | POA: Diagnosis not present

## 2021-11-26 DIAGNOSIS — R5383 Other fatigue: Secondary | ICD-10-CM | POA: Diagnosis not present

## 2021-11-26 DIAGNOSIS — K219 Gastro-esophageal reflux disease without esophagitis: Secondary | ICD-10-CM | POA: Diagnosis not present

## 2021-11-30 ENCOUNTER — Telehealth: Payer: Self-pay | Admitting: *Deleted

## 2021-11-30 ENCOUNTER — Ambulatory Visit: Payer: Medicare Other | Admitting: Gastroenterology

## 2021-11-30 ENCOUNTER — Encounter: Payer: Self-pay | Admitting: Gastroenterology

## 2021-11-30 VITALS — BP 161/67 | HR 57 | Temp 97.5°F | Ht 66.0 in | Wt 130.6 lb

## 2021-11-30 DIAGNOSIS — R634 Abnormal weight loss: Secondary | ICD-10-CM

## 2021-11-30 DIAGNOSIS — K219 Gastro-esophageal reflux disease without esophagitis: Secondary | ICD-10-CM

## 2021-11-30 DIAGNOSIS — R1310 Dysphagia, unspecified: Secondary | ICD-10-CM | POA: Insufficient documentation

## 2021-11-30 MED ORDER — PANTOPRAZOLE SODIUM 40 MG PO TBEC: 40.0000 mg | DELAYED_RELEASE_TABLET | Freq: Two times a day (BID) | ORAL | 3 refills | Status: DC

## 2021-11-30 NOTE — Telephone Encounter (Signed)
[name]   BPE is scheduled for 12/02/21 at 10:00 am, arrive at 9:45 am, nothing to eat or drink 3 hours prior.

## 2021-11-30 NOTE — Patient Instructions (Addendum)
Please stop omeprazole. Instead, start taking pantoprazole twice a day, 30 minutes before breakfast and dinner. Do this for one month, then return to just once a day.   I am ordering a special xray of your esophagus.  I would like to see you in 3 months to make sure your weight is stable!  It was a pleasure to see you today. I want to create trusting relationships with patients to provide genuine, compassionate, and quality care. I value your feedback. If you receive a survey regarding your visit,  I greatly appreciate you taking time to fill this out.   Annitta Needs, PhD, ANP-BC [facility]

## 2021-11-30 NOTE — Progress Notes (Signed)
Gastroenterology Office Note     Primary Care Physician:  [facility]  Primary Gastroenterologist: Dr. Abbey Chatters    Chief Complaint   Chief Complaint  Patient presents with   Follow-up    Follow up on weight loss     History of Present Illness   [name] is an 88 y.o. male presenting today in follow-up with a history of GERD, dysphagia, weight loss. Underwent EGD/dilation July 2023 and found to have non-severe candida esophagitis, mild Schatzki ring s/p dilation, normal stomach and duodenal bulb, retained food in duodenum.    Weight loss: in 2020 was in the mid 140s, 2021 high 130s, 2022 mid 120s-130s. Now 130. He is stable weight from May 2023 when last seen.    Gets hiccups when eating. Regurgitation. Omeprazole once daily. Stringy foods cause dysphagia. No odynophagia. Appetite is fair. Completed course of diflucan.    Past Medical History:  Diagnosis Date   Arthritis    Asymptomatic stenosis of left vertebral artery 2002, 2005   Status post PTA/stent with redo; normal antegrade flow on dopplers 09/2013   CAD (coronary artery disease) 1995   1CABG x3; redo in CABG x 1 2001 Free Radial to LAD; All grafts patent by Cath 08/2014: The proximal to mid LAD has poor retrograde filling from the LIMA due to in-stent restenosis. 50% ostial disease of free radial artery to the LAD.   CAD (coronary artery disease) of artery bypass graft 2000   PCI x 2 - ostial & prox-mid LAD (BMS) for atretic LIMA-LAD   CAD in native artery 1995   CABG x 3 - LIMA-LAD, SVG-OM2, SVG-rPDA   Chest pain 09/2017   GERD (gastroesophageal reflux disease)    Glaucoma    Hyperlipidemia    Hypertension, essential    S/P CABG x 3 1995   S/P Redo CABG x 1 2001   L Radial-LAD after 2 failed attempts @ LIMA-LAD PTCA; LAD stents 100% occluded   Stenosis of left subclavian artery ([place]) 11/2003   Status post PTA/stent --> < 50 % stenosis by Dopplers 09/2013   Type 2 diabetes mellitus  ([place]) 2002    Past Surgical History:  Procedure Laterality Date   BALLOON DILATION N/A 07/07/2021   Procedure: BALLOON DILATION;  Surgeon: Eloise Harman, DO;  Location: [facility];  Service: Endoscopy;  Laterality: N/A;   CARDIAC CATHETERIZATION N/A 08/04/2014   Procedure: Left Heart Cath and Cors/Grafts Angiography;  Surgeon: Leonie Man, MD;  Location: [facility];  Service: Cardiovascular: o-mRCA 70%, m-dRCA ~70% - SVG-dRCA Patent.  o-pLAD 100% stent occluded, mLAD 95% ISR with poor retrograde filling from freeRadiial graft-LAD (50% ostial graft dz).  o-p Cx 80%. Widely patent SVG-Cx-OM fills retrograde to 80% lesion.; Normal LV Fxn & EDP   CORONARY ANGIOPLASTY  April and May 2001   After Both LAD stents occluded - PTCA of anastomatic LIMA-LAD lesion -- Unsuccessful.   CORONARY ARTERY BYPASS GRAFT  1995   LIMA-LAD, SVG-OM2, SVG-rPDA   CORONARY ARTERY BYPASS GRAFT  06/1999   Dr. Lucianne Lei Trigt: Redo LAD grafting with free Left Radial-distal LAD   CORONARY STENT PLACEMENT  1995-2000   2 BMS stents to osital-proximal & proximal-mid LAD; because of atretic LIMA-LDA   ESOPHAGEAL BRUSHING  07/07/2021   Procedure: ESOPHAGEAL BRUSHING;  Surgeon: Eloise Harman, DO;  Location: [facility];  Service: Endoscopy;;   ESOPHAGOGASTRODUODENOSCOPY (EGD) WITH PROPOFOL N/A 07/07/2021   Procedure: ESOPHAGOGASTRODUODENOSCOPY (  EGD) WITH PROPOFOL;  Surgeon: Eloise Harman, DO;  Location: [facility];  Service: Endoscopy;  Laterality: N/A;  9:15am   LEFT HEART CATH AND CORONARY ANGIOGRAPHY N/A 09/18/2017   Procedure: LEFT HEART CATH AND CORONARY ANGIOGRAPHY;  Surgeon: Martinique, Peter M, MD;  Location: [facility];  Service: Cardiovascular;  Laterality: N/A;   LEFT HEART CATHETERIZATION WITH CORONARY/GRAFT ANGIOGRAM N/A 08/22/2011   Procedure: LEFT HEART CATHETERIZATION WITH Beatrix Fetters;  Surgeon: Leonie Man, MD;  Location: [facility];  Service: Cardiovascular:  Known  occluded LIMA-LAD and ostial LAD. Moderate to severe proximal circumflex and RCA disease. Widely patent freeLRAD-dLAD, as well as SVG-RPDA (backfilling RPL), SVG-OM 2 (backfilling OM1)   SHOULDER OPEN ROTATOR CUFF REPAIR  10/2002   Dr. Noemi Chapel   [facility] VERT OR THOR CAROTID STENT Left October 2002; November 2005   Left Vertebral stent placed in October 2002 (Dr. Patrecia Pour); redo PCI in 2005   [place] Left 11/2003   Dr. Gwenlyn Found   TRANSTHORACIC ECHOCARDIOGRAM  04/10/2020   [facility]): Normal LV size and function.  EF 60 to 65%.  GR 1 DD.  Mild AI.  Mild to moderate LA dilation.  GR 1 DD.[name]   TRANSTHORACIC ECHOCARDIOGRAM  02/2021   EF 70 to 75%.  Hyperdynamic.  No RWMA.  GR 1 DD.  Normal RV, RVP and low normal RAP.  Normal valves.    Current Outpatient Medications  Medication Sig Dispense Refill   acetaminophen (TYLENOL) 500 MG tablet Take 1,000 mg by mouth every 6 (six) hours as needed for mild pain or headache.     albuterol (VENTOLIN HFA) 108 (90 Base) MCG/ACT inhaler Inhale 2 puffs into the lungs every 6 (six) hours as needed for wheezing or shortness of breath.     amLODipine (NORVASC) 5 MG tablet Take 1 tablet (5 mg total) by mouth daily. 30 tablet 1   aspirin EC 81 MG tablet Take 81 mg by mouth daily with breakfast.      Brinzolamide-Brimonidine 1-0.2 % SUSP Place 1 drop into the right eye 3 (three) times daily.     Cholecalciferol (VITAMIN D) 2000 units tablet Take 2,000 Units by mouth daily with breakfast.     clopidogrel (PLAVIX) 75 MG tablet Take 75 mg by mouth daily.     diclofenac Sodium (VOLTAREN) 1 % GEL Apply 2 g topically 4 (four) times daily.     doxazosin (CARDURA) 8 MG tablet Take 1 tablet (8 mg total) by mouth at bedtime. 30 tablet 1   finasteride (PROSCAR) 5 MG tablet Take 5 mg by mouth daily.     gabapentin (NEURONTIN) 300 MG capsule Take 600 mg by mouth 3 (three) times daily.     isosorbide mononitrate (IMDUR) 60 MG 24 hr tablet Take 1 tablet (60  mg total) by mouth daily.     Omega-3 Fatty Acids (FISH OIL) 1000 MG CAPS Take 1,000 mg by mouth 2 (two) times daily with a meal.      omeprazole (PRILOSEC) 20 MG capsule Take 20 mg by mouth daily.     pantoprazole (PROTONIX) 40 MG tablet Take 1 tablet (40 mg total) by mouth 2 (two) times daily before a meal. 60 tablet 3   prednisoLONE acetate (PRED FORTE) 1 % ophthalmic suspension Place 1 drop into the left eye daily.     ranolazine (RANEXA) 500 MG 12 hr tablet Take 1 tablet (500 mg total) by mouth 2 (two) times daily. 60 tablet 1  rosuvastatin (CRESTOR) 40 MG tablet TAKE 1 TABLET BY MOUTH DAILY 90 tablet 3   TRESIBA FLEXTOUCH 100 UNIT/ML FlexTouch Pen Inject 30 Units into the skin at bedtime.     vitamin B-12 (VITAMIN B12) 500 MCG tablet Take 1 tablet (500 mcg total) by mouth daily.     vitamin C (ASCORBIC ACID) 500 MG tablet Take 500 mg by mouth 2 (two) times daily with a meal.      nitroGLYCERIN (NITROSTAT) 0.4 MG SL tablet Place 1 tablet (0.4 mg total) under the tongue every 5 (five) minutes as needed for up to 25 days for chest pain. And increase blood pressure greater than 425 systolic 25 tablet 6   No current facility-administered medications for this visit.    Allergies as of 11/30/2021 - Review Complete 11/10/2021  Allergen Reaction Noted   Iohexol Other (See Comments) 11/03/2002   Contrast media [iodinated contrast media]  11/09/2012   Metformin and related Diarrhea 11/11/2012    Family History  Problem Relation Age of Onset   Diabetes Mother    Diabetes Sister    Diabetes Brother     Social History   Socioeconomic History   Marital status: Married    Spouse name: Not on file   Number of children: Not on file   Years of education: Not on file   Highest education level: Not on file  Occupational History   Not on file  Tobacco Use   Smoking status: Former    Packs/day: 3.00    Years: 30.00    Total pack years: 90.00    Types: Cigarettes   Smokeless tobacco:  Never   Tobacco comments:    quit smoking about 50 years ago  Vaping Use   Vaping Use: Never used  Substance and Sexual Activity   Alcohol use: No   Drug use: No   Sexual activity: Not Currently  Other Topics Concern   Not on file  Social History Narrative   He is married with one daughter. He has 2 grandchildren.   He does not smoke. He quit smoking in roughly 1970 after smoking 3 packs per day.   He is routinely at give him. It does not necessarily do routine exercise.    Social Determinants of Health   Financial Resource Strain: Not on file  Food Insecurity: No Food Insecurity (10/21/2021)   Hunger Vital Sign    Worried About Running Out of Food in the Last Year: Never true    Ran Out of Food in the Last Year: Never true  Transportation Needs: No Transportation Needs (10/21/2021)   PRAPARE - Hydrologist (Medical): No    Lack of Transportation (Non-Medical): No  Physical Activity: Not on file  Stress: Not on file  Social Connections: Not on file  Intimate Partner Violence: Not At Risk (10/21/2021)   Humiliation, Afraid, Rape, and Kick questionnaire    Fear of Current or Ex-Partner: No    Emotionally Abused: No    Physically Abused: No    Sexually Abused: No     Review of Systems   Gen: Denies any fever, chills, fatigue, weight loss, lack of appetite.  CV: Denies chest pain, heart palpitations, peripheral edema, syncope.  Resp: Denies shortness of breath at rest or with exertion. Denies wheezing or cough.  GI: see HPI  GU : Denies urinary burning, urinary frequency, urinary hesitancy MS: Denies joint pain, muscle weakness, cramps, or limitation of movement.  Derm: Denies rash, itching,  dry skin Psych: Denies depression, anxiety, memory loss, and confusion Heme: Denies bruising, bleeding, and enlarged lymph nodes.   Physical Exam   BP (!) 161/67   Pulse (!) 57   Temp (!) 97.5 F (36.4 C)   Ht 5\' 6"  (1.676 m)   Wt 130 lb 9.6 oz  (59.2 kg)   BMI 21.08 kg/m  General:   Alert and oriented. Pleasant and cooperative. Well-nourished and well-developed.  Head:  Normocephalic and atraumatic. Eyes:  Without icterus Abdomen:  +BS, soft, non-tender and non-distended. No HSM noted. No guarding or rebound. No masses appreciated.  Rectal:  Deferred  Msk:  Symmetrical without gross deformities. Normal posture. Extremities:  Without edema. Neurologic:  Alert and  oriented x4;  grossly normal neurologically. Skin:  Intact without significant lesions or rashes. Psych:  Alert and cooperative. Normal mood and affect.   Assessment   [name] is an 88 y.o. male presenting today in follow-up with a history of  GERD, dysphagia, weight loss.   GERD: on omeprazole once daily with regurgitation. Will change to pantoprazole and briefly increase to BID for one month, then back to once daily.  Dysphagia: not improved. No odynophagia. Completed course of diflucan for candida. Query dysmotility underlying. Will further evaluate with BPE.   Weight loss: in 2020 was in the mid 140s, 2021 high 130s, 2022 mid 120s-130s. Now 130. He is stable weight from May 2023 when last seen. Follow closely   PLAN    Stop omeprazole Start pantoprazole BID for one month then back to once daily BPE 3 months for weight check    Annitta Needs, PhD, ANP-BC [facility]

## 2021-12-01 NOTE — Telephone Encounter (Signed)
Pt informed to procedure date and time. He said he needed something in the afternoon due to his daughter's work schedule. Advised pt this procedure is usually done in the morning time. Told him that it could be done on Monday if that would give him a little more time. He said he would check with his daughter and call back.

## 2021-12-01 NOTE — Telephone Encounter (Signed)
Pt says he can do the procedure tomorrow that he has someone to take him

## 2021-12-02 ENCOUNTER — Ambulatory Visit (HOSPITAL_COMMUNITY)
Admission: RE | Admit: 2021-12-02 | Discharge: 2021-12-02 | Disposition: A | Discharge status: Home / Self Care | Payer: Medicare Other | Source: Ambulatory Visit | Attending: Gastroenterology | Admitting: Gastroenterology

## 2021-12-02 DIAGNOSIS — K224 Dyskinesia of esophagus: Secondary | ICD-10-CM | POA: Diagnosis not present

## 2021-12-02 DIAGNOSIS — K219 Gastro-esophageal reflux disease without esophagitis: Secondary | ICD-10-CM | POA: Diagnosis not present

## 2021-12-02 DIAGNOSIS — R1310 Dysphagia, unspecified: Secondary | ICD-10-CM | POA: Diagnosis not present

## 2021-12-02 DIAGNOSIS — I482 Chronic atrial fibrillation, unspecified: Secondary | ICD-10-CM | POA: Diagnosis not present

## 2021-12-02 DIAGNOSIS — J440 Chronic obstructive pulmonary disease with acute lower respiratory infection: Secondary | ICD-10-CM | POA: Diagnosis not present

## 2021-12-02 DIAGNOSIS — E1169 Type 2 diabetes mellitus with other specified complication: Secondary | ICD-10-CM | POA: Diagnosis not present

## 2021-12-06 ENCOUNTER — Telehealth: Payer: Self-pay | Admitting: Internal Medicine

## 2021-12-06 DIAGNOSIS — Z6821 Body mass index (BMI) 21.0-21.9, adult: Secondary | ICD-10-CM | POA: Diagnosis not present

## 2021-12-06 DIAGNOSIS — D696 Thrombocytopenia, unspecified: Secondary | ICD-10-CM | POA: Diagnosis not present

## 2021-12-06 DIAGNOSIS — I10 Essential (primary) hypertension: Secondary | ICD-10-CM | POA: Diagnosis not present

## 2021-12-06 DIAGNOSIS — E1169 Type 2 diabetes mellitus with other specified complication: Secondary | ICD-10-CM | POA: Diagnosis not present

## 2021-12-06 DIAGNOSIS — Z87898 Personal history of other specified conditions: Secondary | ICD-10-CM | POA: Diagnosis not present

## 2021-12-06 NOTE — Telephone Encounter (Signed)
Patient's daughter called to say that his doctor, Dr. Jimmye Norman, is concerned about him losing blood and wants him to get in sooner than February.  I have looked and there are no appointments until late January.  Please advise.

## 2021-12-07 NOTE — Telephone Encounter (Signed)
I just saw this patient last week.   We need labs from PCP, as I do not have anything recent other than October when his Hgb was 12.1.

## 2021-12-07 NOTE — Telephone Encounter (Signed)
noted 

## 2021-12-07 NOTE — Telephone Encounter (Signed)
Phoned the pt's daughter and was advised that it is the pt's PCP who wants the pt seen  sooner than February. Pt checks his blood sugar on his abdomen and from that pt decided he bleed too much. Dr. Jimmye Norman also advises he doesn't want the pt on 2 Pantoprazole a day that he only needs to be on it once a day. Pt's daughter states she hasn't seen any blood nor did she go back with the pt at his PCP's visit. Please advise

## 2021-12-14 ENCOUNTER — Ambulatory Visit (INDEPENDENT_AMBULATORY_CARE_PROVIDER_SITE_OTHER): Payer: Medicare Other | Admitting: Gastroenterology

## 2021-12-14 ENCOUNTER — Encounter: Payer: Self-pay | Admitting: Gastroenterology

## 2021-12-14 VITALS — BP 158/69 | HR 57 | Temp 97.8°F | Ht 66.0 in | Wt 128.5 lb

## 2021-12-14 DIAGNOSIS — D649 Anemia, unspecified: Secondary | ICD-10-CM

## 2021-12-14 NOTE — Progress Notes (Signed)
Gastroenterology Office Note     Primary Care Physician:  [facility]  Primary Gastroenterologist: Dr. Abbey Chatters    Chief Complaint   Chief Complaint  Patient presents with   loosing blood    Patient here today after being referred by [facility] due to " losing Blood" patient states he has not seen any bright red blood or dark stools. Denies any shortness of breath, dizziness or fatigue. Hgb 10/21/2021 12.1     History of Present Illness   [name] is an 88 y.o. male presenting today in follow-up with a history of GERD, dysphagia, weight loss. Underwent EGD/dilation July 2023 and found to have non-severe candida esophagitis, mild Schatzki ring s/p dilation, normal stomach and duodenal bulb, retained food in duodenum. Colonoscopy over 10 years ago in [place] per patient.   BPE Dec 02, 2021: frank tracheal aspiration during pharyngeal phase of swallowing, non-specific esophageal dysmotility disorder, small type 1 hiatal hernia, mild distal esophageal fold thickening. He is not interested in pursuing Speech Eval. Dysphagia occasionally.   Hgb 13.2 in Feb 2023 Hgb 11.7, Hct 33.7 in Oct 30, 2021 Hgb 11.7 again in Nov 29, 2021   Weight loss: in 2020 was in the mid 140s, 2021 high 130s, 2022 mid 120s-130s. 130 11/30/21. Today 128.5. good appetite but diet is changing due to history of diabetes.   Taking pantoprazole once a day. This was cut down from BID. He has no overt GI bleeding. No abdominal pain.   Due to mild anemia, PCP has requested he be seen again to discuss possible colonoscopy. Patient is wanting to hold off on this unless absolutely necessary.    Past Medical History:  Diagnosis Date   Arthritis    Asymptomatic stenosis of left vertebral artery 2002, 2005   Status post PTA/stent with redo; normal antegrade flow on dopplers 09/2013   CAD (coronary artery disease) 1995   1CABG x3; redo in CABG x 1 2001 Free Radial to LAD; All grafts patent  by Cath 08/2014: The proximal to mid LAD has poor retrograde filling from the LIMA due to in-stent restenosis. 50% ostial disease of free radial artery to the LAD.   CAD (coronary artery disease) of artery bypass graft 2000   PCI x 2 - ostial & prox-mid LAD (BMS) for atretic LIMA-LAD   CAD in native artery 1995   CABG x 3 - LIMA-LAD, SVG-OM2, SVG-rPDA   Chest pain 09/2017   GERD (gastroesophageal reflux disease)    Glaucoma    Hyperlipidemia    Hypertension, essential    S/P CABG x 3 1995   S/P Redo CABG x 1 2001   L Radial-LAD after 2 failed attempts @ LIMA-LAD PTCA; LAD stents 100% occluded   Stenosis of left subclavian artery ([place]) 11/2003   Status post PTA/stent --> < 50 % stenosis by Dopplers 09/2013   Type 2 diabetes mellitus ([place]) 2002    Past Surgical History:  Procedure Laterality Date   BALLOON DILATION N/A 07/07/2021   Procedure: BALLOON DILATION;  Surgeon: Eloise Harman, DO;  [facility]: [facility];  Service: Endoscopy;  Laterality: N/A;   CARDIAC CATHETERIZATION N/A 08/04/2014   Procedure: Left Heart Cath and Cors/Grafts Angiography;  Surgeon: Leonie Man, MD;  Location: [facility];  Service: Cardiovascular: o-mRCA 70%, m-dRCA ~70% - SVG-dRCA Patent.  o-pLAD 100% stent occluded, mLAD 95% ISR with poor retrograde filling from freeRadiial graft-LAD (50% ostial graft dz).  o-p  Cx 80%. Widely patent SVG-Cx-OM fills retrograde to 80% lesion.; Normal LV Fxn & EDP   CORONARY ANGIOPLASTY  April and May 2001   After Both LAD stents occluded - PTCA of anastomatic LIMA-LAD lesion -- Unsuccessful.   CORONARY ARTERY BYPASS GRAFT  1995   LIMA-LAD, SVG-OM2, SVG-rPDA   CORONARY ARTERY BYPASS GRAFT  06/1999   Dr. Lucianne Lei Trigt: Redo LAD grafting with free Left Radial-distal LAD   CORONARY STENT PLACEMENT  1995-2000   2 BMS stents to osital-proximal & proximal-mid LAD; because of atretic LIMA-LDA   ESOPHAGEAL BRUSHING  07/07/2021   Procedure: ESOPHAGEAL BRUSHING;  Surgeon:  Eloise Harman, DO;  Location: [facility];  Service: Endoscopy;;   ESOPHAGOGASTRODUODENOSCOPY (EGD) WITH PROPOFOL N/A 07/07/2021   non-severe candida esophagitis, mild Schatzki ring s/p dilation, normal stomach and duodenal bulb, retained food in duodenum.   LEFT HEART CATH AND CORONARY ANGIOGRAPHY N/A 09/18/2017   Procedure: LEFT HEART CATH AND CORONARY ANGIOGRAPHY;  Surgeon: Martinique, Peter M, MD;  Location: [facility];  Service: Cardiovascular;  Laterality: N/A;   LEFT HEART CATHETERIZATION WITH CORONARY/GRAFT ANGIOGRAM N/A 08/22/2011   Procedure: LEFT HEART CATHETERIZATION WITH Beatrix Fetters;  Surgeon: Leonie Man, MD;  Location: [facility] C[facility];  Service: Cardiovascular:  Known occluded LIMA-LAD and ostial LAD. Moderate to severe proximal circumflex and RCA disease. Widely patent freeLRAD-dLAD, as well as SVG-RPDA (backfilling RPL), SVG-OM 2 (backfilling OM1)   SHOULDER OPEN ROTATOR CUFF REPAIR  10/2002   Dr. Noemi Chapel   SP [facility] VERT OR THOR CAROTID STENT Left October 2002; November 2005   Left Vertebral stent placed in October 2002 (Dr. Patrecia Pour); redo PCI in 2005   [place] Left 11/2003   Dr. Gwenlyn Found   TRANSTHORACIC ECHOCARDIOGRAM  04/10/2020   [facility]): Normal LV size and function.  EF 60 to 65%.  GR 1 DD.  Mild AI.  Mild to moderate LA dilation.  GR 1 DD.[name]   TRANSTHORACIC ECHOCARDIOGRAM  02/2021   EF 70 to 75%.  Hyperdynamic.  No RWMA.  GR 1 DD.  Normal RV, RVP and low normal RAP.  Normal valves.    Current Outpatient Medications  Medication Sig Dispense Refill   acetaminophen (TYLENOL) 500 MG tablet Take 1,000 mg by mouth every 6 (six) hours as needed for mild pain or headache.     albuterol (VENTOLIN HFA) 108 (90 Base) MCG/ACT inhaler Inhale 2 puffs into the lungs every 6 (six) hours as needed for wheezing or shortness of breath.     amLODipine (NORVASC) 5 MG tablet Take 1 tablet (5 mg total) by mouth daily. 30 tablet 1   aspirin EC 81  MG tablet Take 81 mg by mouth daily with breakfast.      Brinzolamide-Brimonidine 1-0.2 % SUSP Place 1 drop into the right eye 3 (three) times daily.     Cholecalciferol (VITAMIN D) 2000 units tablet Take 2,000 Units by mouth daily with breakfast.     clopidogrel (PLAVIX) 75 MG tablet Take 75 mg by mouth daily.     diclofenac Sodium (VOLTAREN) 1 % GEL Apply 2 g topically 4 (four) times daily.     doxazosin (CARDURA) 8 MG tablet Take 1 tablet (8 mg total) by mouth at bedtime. 30 tablet 1   finasteride (PROSCAR) 5 MG tablet Take 5 mg by mouth daily.     gabapentin (NEURONTIN) 300 MG capsule Take 600 mg by mouth 3 (three) times daily.     isosorbide mononitrate (IMDUR) 60 MG  24 hr tablet Take 1 tablet (60 mg total) by mouth daily.     nitroGLYCERIN (NITROSTAT) 0.4 MG SL tablet Place 1 tablet (0.4 mg total) under the tongue every 5 (five) minutes as needed for up to 25 days for chest pain. And increase blood pressure greater than 588 systolic 25 tablet 6   Omega-3 Fatty Acids (FISH OIL) 1000 MG CAPS Take 1,000 mg by mouth 2 (two) times daily with a meal.      pantoprazole (PROTONIX) 40 MG tablet Take 1 tablet (40 mg total) by mouth 2 (two) times daily before a meal. (Patient taking differently: Take 40 mg by mouth daily.) 60 tablet 3   prednisoLONE acetate (PRED FORTE) 1 % ophthalmic suspension Place 1 drop into the left eye daily.     ranolazine (RANEXA) 500 MG 12 hr tablet Take 1 tablet (500 mg total) by mouth 2 (two) times daily. 60 tablet 1   rosuvastatin (CRESTOR) 40 MG tablet TAKE 1 TABLET BY MOUTH DAILY 90 tablet 3   TRESIBA FLEXTOUCH 100 UNIT/ML FlexTouch Pen Inject 30 Units into the skin daily.     vitamin B-12 (VITAMIN B12) 500 MCG tablet Take 1 tablet (500 mcg total) by mouth daily.     vitamin C (ASCORBIC ACID) 500 MG tablet Take 500 mg by mouth 2 (two) times daily with a meal.      No current facility-administered medications for this visit.    Allergies as of 12/14/2021 - Review  Complete 12/14/2021  Allergen Reaction Noted   Iohexol Other (See Comments) 11/03/2002   Contrast media [iodinated contrast media]  11/09/2012   Metformin and related Diarrhea 11/11/2012    Family History  Problem Relation Age of Onset   Diabetes Mother    Diabetes Sister    Diabetes Brother     Social History   Socioeconomic History   Marital status: Married    Spouse name: Not on file   Number of children: Not on file   Years of education: Not on file   Highest education level: Not on file  Occupational History   Not on file  Tobacco Use   Smoking status: Former    Packs/day: 3.00    Years: 30.00    Total pack years: 90.00    Types: Cigarettes   Smokeless tobacco: Never   Tobacco comments:    quit smoking about 50 years ago  Vaping Use   Vaping Use: Never used  Substance and Sexual Activity   Alcohol use: No   Drug use: No   Sexual activity: Not Currently  Other Topics Concern   Not on file  Social History Narrative   He is married with one daughter. He has 2 grandchildren.   He does not smoke. He quit smoking in roughly 1970 after smoking 3 packs per day.   He is routinely at give him. It does not necessarily do routine exercise.    Social Determinants of Health   Financial Resource Strain: Not on file  Food Insecurity: No Food Insecurity (10/21/2021)   Hunger Vital Sign    Worried About Running Out of Food in the Last Year: Never true    Ran Out of Food in the Last Year: Never true  Transportation Needs: No Transportation Needs (10/21/2021)   PRAPARE - Hydrologist (Medical): No    Lack of Transportation (Non-Medical): No  Physical Activity: Not on file  Stress: Not on file  Social Connections: Not on file  Intimate Partner Violence: Not At Risk (10/21/2021)   Humiliation, Afraid, Rape, and Kick questionnaire    Fear of Current or Ex-Partner: No    Emotionally Abused: No    Physically Abused: No    Sexually Abused: No      Review of Systems   Gen: Denies any fever, chills, fatigue, weight loss, lack of appetite.  CV: Denies chest pain, heart palpitations, peripheral edema, syncope.  Resp: Denies shortness of breath at rest or with exertion. Denies wheezing or cough.  GI: see HPI GU : Denies urinary burning, urinary frequency, urinary hesitancy MS: Denies joint pain, muscle weakness, cramps, or limitation of movement.  Derm: Denies rash, itching, dry skin Psych: Denies depression, anxiety, memory loss, and confusion Heme: Denies bruising, bleeding, and enlarged lymph nodes.   Physical Exam   BP (!) 158/69 (BP Location: Right Arm, Patient Position: Sitting, Cuff Size: Small)   Pulse (!) 57   Temp 97.8 F (36.6 C) (Temporal)   Ht 5\' 6"  (1.676 m)   Wt 128 lb 8 oz (58.3 kg)   BMI 20.74 kg/m  General:   Alert and oriented. Pleasant and cooperative. Well-nourished and well-developed.  Head:  Normocephalic and atraumatic. Eyes:  Without icterus Abdomen:  +BS, soft, non-tender and non-distended. No HSM noted. No guarding or rebound. No masses appreciated.  Rectal:  Deferred  Msk:  Symmetrical without gross deformities. Normal posture. Extremities:  Without edema. Neurologic:  Alert and  oriented x4;  grossly normal neurologically. Skin:  Intact without significant lesions or rashes. Psych:  Alert and cooperative. Normal mood and affect.   Assessment   [name] is an 88 y.o. male presenting today in follow-up with a history of GERD, dysphagia, weight loss. Underwent EGD/dilation July 2023 and found to have non-severe candida esophagitis, mild Schatzki ring s/p dilation, normal stomach and duodenal bulb, retained food in duodenum. Colonoscopy over 10 years ago in [place] per patient.   PCP requesting patient be evaluated due to anemia. Hgb 13.2 in Feb 2023, 11.7 in Oct, and stable at 11.7 Nov 29, 2021. No overt GI bleeding. On aspirin and Plavix. Could have occult blood loss anywhere in GI  tract. Will check iron studies to evaluate. Patient is hesitant on pursuing colonoscopy. Consider capsule study if does not want to pursue colonoscopy.   Dysphagia: s/p dilation in July 2023. No odynophagia. Appears to have oropharyngeal component. BPE with frank tracheal aspiration. Does not want to pursue Speech Eval. Continue PPI daily.   Weight loss: in 2020 was in the mid 140s, 2021 high 130s, 2022 mid 120s-130s. 130 11/30/21. Today 128.5. good appetite but diet is changing due to history of diabetes. No abdominal pain. Will follow closely  The patient was found to have elevated blood pressure when vital signs were checked in the office. The blood pressure was rechecked by the nursing staff, and it was found to be persistently elevated >140/90 mmHg. I personally advised the patient to follow up closely with the PCP for hypertension control.   PLAN    Continue PPI daily Check iron studies Recommend Speech Therapy but declining Further recommendations to follow    Annitta Needs, PhD, ANP-BC [facility]

## 2021-12-14 NOTE — Patient Instructions (Addendum)
Continue pantoprazole once daily.  Please have iron studies done today.  We will make sure you don't have dropping iron levels.  Your hemoglobin was actually stable over the past month. Please call if any signs of black, tarry stool or bright red blood in stool!  Please follow-up with your primary care regarding blood pressure control.   I enjoyed seeing you again today! As you know, I value our relationship and want to provide genuine, compassionate, and quality care. I welcome your feedback. If you receive a survey regarding your visit,  I greatly appreciate you taking time to fill this out. See you next time!  Annitta Needs, PhD, ANP-BC [facility]astroenter[facility]

## 2021-12-15 LAB — IRON,TIBC AND FERRITIN PANEL
%SAT: 24 % (calc) (ref 20–48)
Ferritin: 205 ng/mL (ref 24–380)
Iron: 59 ug/dL (ref 50–180)
TIBC: 242 mcg/dL (calc) — ABNORMAL LOW (ref 250–425)

## 2021-12-21 DIAGNOSIS — Z87898 Personal history of other specified conditions: Secondary | ICD-10-CM | POA: Diagnosis not present

## 2021-12-21 DIAGNOSIS — D696 Thrombocytopenia, unspecified: Secondary | ICD-10-CM | POA: Diagnosis not present

## 2021-12-21 DIAGNOSIS — E1165 Type 2 diabetes mellitus with hyperglycemia: Secondary | ICD-10-CM | POA: Diagnosis not present

## 2021-12-21 DIAGNOSIS — R634 Abnormal weight loss: Secondary | ICD-10-CM | POA: Diagnosis not present

## 2021-12-21 DIAGNOSIS — Z23 Encounter for immunization: Secondary | ICD-10-CM | POA: Diagnosis not present

## 2021-12-21 DIAGNOSIS — I10 Essential (primary) hypertension: Secondary | ICD-10-CM | POA: Diagnosis not present

## 2021-12-21 DIAGNOSIS — E1169 Type 2 diabetes mellitus with other specified complication: Secondary | ICD-10-CM | POA: Diagnosis not present

## 2021-12-23 DIAGNOSIS — I10 Essential (primary) hypertension: Secondary | ICD-10-CM | POA: Diagnosis not present

## 2021-12-23 DIAGNOSIS — E1165 Type 2 diabetes mellitus with hyperglycemia: Secondary | ICD-10-CM | POA: Diagnosis not present

## 2021-12-23 DIAGNOSIS — E1169 Type 2 diabetes mellitus with other specified complication: Secondary | ICD-10-CM | POA: Diagnosis not present

## 2021-12-23 DIAGNOSIS — E1140 Type 2 diabetes mellitus with diabetic neuropathy, unspecified: Secondary | ICD-10-CM | POA: Diagnosis not present

## 2021-12-30 ENCOUNTER — Ambulatory Visit: Payer: Medicare Other | Admitting: "Endocrinology

## 2022-01-02 DIAGNOSIS — I10 Essential (primary) hypertension: Secondary | ICD-10-CM | POA: Diagnosis not present

## 2022-01-02 DIAGNOSIS — J449 Chronic obstructive pulmonary disease, unspecified: Secondary | ICD-10-CM | POA: Diagnosis not present

## 2022-01-02 DIAGNOSIS — E782 Mixed hyperlipidemia: Secondary | ICD-10-CM | POA: Diagnosis not present

## 2022-01-04 DIAGNOSIS — E876 Hypokalemia: Secondary | ICD-10-CM | POA: Diagnosis not present

## 2022-01-04 DIAGNOSIS — I10 Essential (primary) hypertension: Secondary | ICD-10-CM | POA: Diagnosis not present

## 2022-01-04 DIAGNOSIS — E1165 Type 2 diabetes mellitus with hyperglycemia: Secondary | ICD-10-CM | POA: Diagnosis not present

## 2022-01-04 DIAGNOSIS — E785 Hyperlipidemia, unspecified: Secondary | ICD-10-CM | POA: Diagnosis not present

## 2022-01-04 DIAGNOSIS — E1169 Type 2 diabetes mellitus with other specified complication: Secondary | ICD-10-CM | POA: Diagnosis not present

## 2022-01-04 DIAGNOSIS — R5383 Other fatigue: Secondary | ICD-10-CM | POA: Diagnosis not present

## 2022-01-10 ENCOUNTER — Ambulatory Visit: Payer: Medicare Other | Admitting: "Endocrinology

## 2022-01-10 ENCOUNTER — Encounter: Payer: Self-pay | Admitting: "Endocrinology

## 2022-01-10 VITALS — BP 132/54 | HR 56 | Ht 66.0 in | Wt 127.4 lb

## 2022-01-10 DIAGNOSIS — E1165 Type 2 diabetes mellitus with hyperglycemia: Secondary | ICD-10-CM

## 2022-01-10 DIAGNOSIS — E782 Mixed hyperlipidemia: Secondary | ICD-10-CM

## 2022-01-10 DIAGNOSIS — E038 Other specified hypothyroidism: Secondary | ICD-10-CM

## 2022-01-10 NOTE — Progress Notes (Unsigned)
01/10/2022, 4:07 PM  Endocrinology follow-up note   Subjective:    Patient ID: [name], male    DOB: 1932-09-08.  [name] is being seen in follow-up after he was seen in consultation for management of currently uncontrolled symptomatic diabetes requested by  [facility]. He did not show up since May 2021.   Past Medical History:  Diagnosis Date   Arthritis    Asymptomatic stenosis of left vertebral artery 2002, 2005   Status post PTA/stent with redo; normal antegrade flow on dopplers 09/2013   CAD (coronary artery disease) 1995   1CABG x3; redo in CABG x 1 2001 Free Radial to LAD; All grafts patent by Cath 08/2014: The proximal to mid LAD has poor retrograde filling from the LIMA due to in-stent restenosis. 50% ostial disease of free radial artery to the LAD.   CAD (coronary artery disease) of artery bypass graft 2000   PCI x 2 - ostial & prox-mid LAD (BMS) for atretic LIMA-LAD   CAD in native artery 1995   CABG x 3 - LIMA-LAD, SVG-OM2, SVG-rPDA   Chest pain 09/2017   GERD (gastroesophageal reflux disease)    Glaucoma    Hyperlipidemia    Hypertension, essential    S/P CABG x 3 1995   S/P Redo CABG x 1 2001   L Radial-LAD after 2 failed attempts @ LIMA-LAD PTCA; LAD stents 100% occluded   Stenosis of left subclavian artery ([place]) 11/2003   Status post PTA/stent --> < 50 % stenosis by Dopplers 09/2013   Type 2 diabetes mellitus ([place]) 2002    Past Surgical History:  Procedure Laterality Date   BALLOON DILATION N/A 07/07/2021   Procedure: BALLOON DILATION;  Surgeon: Eloise Harman, DO;  [facility];  Service: Endoscopy;  Laterality: N/A;   CARDIAC CATHETERIZATION N/A 08/04/2014   Procedure: Left Heart Cath and Cors/Grafts Angiography;  Surgeon: Leonie Man, MD;  Location: [place] [facility];  Service: Cardiovascular: o-mRCA 70%, m-dRCA ~70% - SVG-dRCA  Patent.  o-pLAD 100% stent occluded, mLAD 95% ISR with poor retrograde filling from freeRadiial graft-LAD (50% ostial graft dz).  o-p Cx 80%. Widely patent SVG-Cx-OM fills retrograde to 80% lesion.; Normal LV Fxn & EDP   CORONARY ANGIOPLASTY  April and May 2001   After Both LAD stents occluded - PTCA of anastomatic LIMA-LAD lesion -- Unsuccessful.   CORONARY ARTERY BYPASS GRAFT  1995   LIMA-LAD, SVG-OM2, SVG-rPDA   CORONARY ARTERY BYPASS GRAFT  06/1999   Dr. Lucianne Lei Trigt: Redo LAD grafting with free Left Radial-distal LAD   CORONARY STENT PLACEMENT  1995-2000   2 BMS stents to osital-proximal & proximal-mid LAD; because of atretic LIMA-LDA   ESOPHAGEAL BRUSHING  07/07/2021   Procedure: ESOPHAGEAL BRUSHING;  Surgeon: Eloise Harman, DO;  Location: [facility];  Service: Endoscopy;;   ESOPHAGOGASTRODUODENOSCOPY (EGD) WITH PROPOFOL N/A 07/07/2021   non-severe candida esophagitis, mild Schatzki ring s/p dilation, normal stomach and duodenal bulb, retained food in duodenum.   LEFT HEART CATH AND CORONARY ANGIOGRAPHY N/A 09/18/2017   Procedure: LEFT HEART CATH AND CORONARY ANGIOGRAPHY;  Surgeon: Martinique, Peter M,  MD;  Location: [place] [facility];  Service: Cardiovascular;  Laterality: N/A;   LEFT HEART CATHETERIZATION WITH CORONARY/GRAFT ANGIOGRAM N/A 08/22/2011   Procedure: LEFT HEART CATHETERIZATION WITH Beatrix Fetters;  Surgeon: Leonie Man, MD;  Location: [facility];  Service: Cardiovascular:  Known occluded LIMA-LAD and ostial LAD. Moderate to severe proximal circumflex and RCA disease. Widely patent freeLRAD-dLAD, as well as SVG-RPDA (backfilling RPL), SVG-OM 2 (backfilling OM1)   SHOULDER OPEN ROTATOR CUFF REPAIR  10/2002   Dr. Noemi Chapel   [facility] VERT OR THOR CAROTID STENT Left October 2002; November 2005   Left Vertebral stent placed in October 2002 (Dr. Patrecia Pour); redo PCI in 2005   [place] Left 11/2003   Dr. Gwenlyn Found   TRANSTHORACIC ECHOCARDIOGRAM   04/10/2020   [facility]): Normal LV size and function.  EF 60 to 65%.  GR 1 DD.  Mild AI.  Mild to moderate LA dilation.  GR 1 DD.[name]   TRANSTHORACIC ECHOCARDIOGRAM  02/2021   EF 70 to 75%.  Hyperdynamic.  No RWMA.  GR 1 DD.  Normal RV, RVP and low normal RAP.  Normal valves.    Social History   Socioeconomic History   Marital status: Married    Spouse name: Not on file   Number of children: Not on file   Years of education: Not on file   Highest education level: Not on file  Occupational History   Not on file  Tobacco Use   Smoking status: Former    Packs/day: 3.00    Years: 30.00    Total pack years: 90.00    Types: Cigarettes   Smokeless tobacco: Never   Tobacco comments:    quit smoking about 50 years ago  Vaping Use   Vaping Use: Never used  Substance and Sexual Activity   Alcohol use: No   Drug use: No   Sexual activity: Not Currently  Other Topics Concern   Not on file  Social History Narrative   He is married with one daughter. He has 2 grandchildren.   He does not smoke. He quit smoking in roughly 1970 after smoking 3 packs per day.   He is routinely at give him. It does not necessarily do routine exercise.    Social Determinants of Health   Financial Resource Strain: Not on file  Food Insecurity: No Food Insecurity (10/21/2021)   Hunger Vital Sign    Worried About Running Out of Food in the Last Year: Never true    Ran Out of Food in the Last Year: Never true  Transportation Needs: No Transportation Needs (10/21/2021)   PRAPARE - Hydrologist (Medical): No    Lack of Transportation (Non-Medical): No  Physical Activity: Not on file  Stress: Not on file  Social Connections: Not on file    Family History  Problem Relation Age of Onset   Diabetes Mother    Diabetes Sister    Diabetes Brother     Outpatient Encounter Medications as of 01/10/2022  Medication Sig   acetaminophen (TYLENOL) 500 MG tablet Take 1,000 mg by  mouth every 6 (six) hours as needed for mild pain or headache.   albuterol (VENTOLIN HFA) 108 (90 Base) MCG/ACT inhaler Inhale 2 puffs into the lungs every 6 (six) hours as needed for wheezing or shortness of breath.   amLODipine (NORVASC) 5 MG tablet Take 1 tablet (5 mg total) by mouth daily.   aspirin EC 81 MG tablet Take  81 mg by mouth daily with breakfast.    Brinzolamide-Brimonidine 1-0.2 % SUSP Place 1 drop into the right eye 3 (three) times daily.   Cholecalciferol (VITAMIN D) 2000 units tablet Take 2,000 Units by mouth daily with breakfast.   clopidogrel (PLAVIX) 75 MG tablet Take 75 mg by mouth daily.   diclofenac Sodium (VOLTAREN) 1 % GEL Apply 2 g topically 4 (four) times daily.   doxazosin (CARDURA) 8 MG tablet Take 1 tablet (8 mg total) by mouth at bedtime.   finasteride (PROSCAR) 5 MG tablet Take 5 mg by mouth daily.   gabapentin (NEURONTIN) 300 MG capsule Take 900 mg by mouth 3 (three) times daily.   isosorbide mononitrate (IMDUR) 60 MG 24 hr tablet Take 1 tablet (60 mg total) by mouth daily.   nitroGLYCERIN (NITROSTAT) 0.4 MG SL tablet Place 1 tablet (0.4 mg total) under the tongue every 5 (five) minutes as needed for up to 25 days for chest pain. And increase blood pressure greater than 161 systolic   Omega-3 Fatty Acids (FISH OIL) 1000 MG CAPS Take 1,000 mg by mouth 2 (two) times daily with a meal.    pantoprazole (PROTONIX) 40 MG tablet Take 1 tablet (40 mg total) by mouth 2 (two) times daily before a meal. (Patient taking differently: Take 40 mg by mouth daily.)   prednisoLONE acetate (PRED FORTE) 1 % ophthalmic suspension Place 1 drop into the left eye daily.   ranolazine (RANEXA) 500 MG 12 hr tablet Take 1 tablet (500 mg total) by mouth 2 (two) times daily.   rosuvastatin (CRESTOR) 40 MG tablet TAKE 1 TABLET BY MOUTH DAILY   TRESIBA FLEXTOUCH 100 UNIT/ML FlexTouch Pen Inject 16 Units into the skin daily with breakfast.   vitamin B-12 (VITAMIN B12) 500 MCG tablet Take 1 tablet  (500 mcg total) by mouth daily.   vitamin C (ASCORBIC ACID) 500 MG tablet Take 500 mg by mouth 2 (two) times daily with a meal.    No facility-administered encounter medications on file as of 01/10/2022.    ALLERGIES: Allergies  Allergen Reactions   Iohexol Other (See Comments)    Intractable shaking.   Contrast Media [Iodinated Contrast Media]     Shivering & shaking. Pt reports takes antihistamine prior to procedures.   Metformin And Related Diarrhea    Subsequently discontinued    VACCINATION STATUS: Immunization History  Administered Date(s) Administered   Influenza, High Dose Seasonal PF 09/17/2017   Influenza-Unspecified 09/03/2013    Diabetes He presents for his follow-up diabetic visit. He has type 2 diabetes mellitus. Onset time: He was diagnosed at approximate age of 55 years. His disease course has been stable. Pertinent negatives for hypoglycemia include no confusion, headaches, hunger, nervousness/anxiousness, pallor, seizures or sweats. Pertinent negatives for diabetes include no chest pain, no fatigue, no polydipsia, no polyphagia, no polyuria and no weakness. There are no hypoglycemic complications. Symptoms are improving. Diabetic complications include heart disease. Risk factors for coronary artery disease include dyslipidemia, diabetes mellitus, family history, male sex, hypertension, sedentary lifestyle and tobacco exposure. Current diabetic treatment includes insulin injections (Patient is now on Tresiba 15 units nightly, Jardiance 25 mg p.o. daily and glipizide 5 mg p.o. daily.). His weight is decreasing steadily (Presents with unintentional loss of more than 15 pounds in the last year, he is unable to regain his weight.). He is following a generally unhealthy diet. When asked about meal planning, he reported none. He has not had a previous visit with a dietitian. He participates  in exercise intermittently. His home blood glucose trend is fluctuating minimally. His  breakfast blood glucose range is generally 140-180 mg/dl. His lunch blood glucose range is generally 140-180 mg/dl. His dinner blood glucose range is generally 140-180 mg/dl. His bedtime blood glucose range is generally 140-180 mg/dl. His overall blood glucose range is 140-180 mg/dl. (He presents with a Dexcom CGM showing a 52% in range, 37% level 1 hyperglycemia, 10% level 2 hyperglycemia.  His average blood glucose is 180 for the last 14 days. POC a1c today is 7.8%.) Eye exam is current.  Hyperlipidemia This is a chronic problem. The current episode started more than 1 year ago. The problem is controlled. Exacerbating diseases include diabetes. Pertinent negatives include no chest pain, myalgias or shortness of breath. Current antihyperlipidemic treatment includes statins. Risk factors for coronary artery disease include dyslipidemia, diabetes mellitus, hypertension, male sex and a sedentary lifestyle.  Hypertension This is a chronic problem. The current episode started more than 1 year ago. Pertinent negatives include no chest pain, headaches, neck pain, palpitations, shortness of breath or sweats. Risk factors for coronary artery disease include family history, dyslipidemia, male gender, diabetes mellitus, sedentary lifestyle and smoking/tobacco exposure. Past treatments include direct vasodilators and calcium channel blockers. Hypertensive end-organ damage includes CAD/MI.     Review of Systems  Constitutional:  Negative for chills, fatigue, fever and unexpected weight change.  HENT:  Negative for dental problem, mouth sores and trouble swallowing.   Eyes:  Negative for visual disturbance.  Respiratory:  Negative for cough, choking, chest tightness, shortness of breath and wheezing.   Cardiovascular:  Negative for chest pain, palpitations and leg swelling.  Gastrointestinal:  Negative for abdominal distention, abdominal pain, constipation, diarrhea, nausea and vomiting.  Endocrine: Negative for  polydipsia, polyphagia and polyuria.  Genitourinary:  Negative for dysuria, flank pain, hematuria and urgency.  Musculoskeletal:  Negative for back pain, gait problem, myalgias and neck pain.  Skin:  Negative for pallor, rash and wound.  Neurological:  Negative for seizures, syncope, weakness, numbness and headaches.  Psychiatric/Behavioral:  Negative for confusion and dysphoric mood. The patient is not nervous/anxious.     Objective:    BP (!) 132/54   Pulse (!) 56   Ht 5\' 6"  (1.676 m)   Wt 127 lb 6.4 oz (57.8 kg)   BMI 20.56 kg/m   Wt Readings from Last 3 Encounters:  01/10/22 127 lb 6.4 oz (57.8 kg)  12/14/21 128 lb 8 oz (58.3 kg)  11/30/21 130 lb 9.6 oz (59.2 kg)       CMP     Component Value Date/Time   NA 141 10/22/2021 0456   K 3.8 10/22/2021 0456   CL 109 10/22/2021 0456   CO2 24 10/22/2021 0456   GLUCOSE 119 (H) 10/22/2021 0456   BUN 14 10/22/2021 0456   CREATININE 0.76 10/22/2021 0456   CALCIUM 9.1 10/22/2021 0456   PROT 5.7 (L) 02/04/2021 0347   ALBUMIN 3.2 (L) 02/04/2021 0347   AST 24 02/04/2021 0347   ALT 22 02/04/2021 0347   ALKPHOS 32 (L) 02/04/2021 0347   BILITOT 0.4 02/04/2021 0347   GFRNONAA >60 10/22/2021 0456   GFRAA >60 09/19/2017 0420     Diabetic Labs (most recent): Lab Results  Component Value Date   HGBA1C 7.8 (A) 09/30/2021   HGBA1C 10.1 06/24/2021   HGBA1C 9.5 (H) 02/04/2021     Lipid Panel ( most recent) Lipid Panel     Component Value Date/Time   CHOL 108 07/21/2021  0000   TRIG 125 07/21/2021 0000   HDL 36 07/21/2021 0000   CHOLHDL 4.5 09/18/2017 0543   VLDL 22 09/18/2017 0543   LDLCALC 50 07/21/2021 0000      Lab Results  Component Value Date   TSH 4.473 10/21/2021   TSH 5.719 (H) 02/04/2021   TSH 1.466 08/02/2014   FREET4 0.75 10/21/2021   FREET4 0.79 02/04/2021    Assessment & Plan:   1. Uncontrolled type 2 diabetes mellitus with hyperglycemia (HCC)  - [name] has currently uncontrolled symptomatic  type 2 DM since  89 years of age.  He presents with a Dexcom CGM showing a 52% in range, 37% level 1 hyperglycemia, 10% level 2 hyperglycemia.  His average blood glucose is 180 for the last 14 days.  -his diabetes is complicated by coronary  artery disease and he remains at a high risk for more acute and chronic complications which include CAD, CVA, CKD, retinopathy, and neuropathy. These are all discussed in detail with him.  Patient is encouraged to switch to  unprocessed or minimally processed     complex starch and increased protein intake of mostly  plant-based, fruits and vegetables.  -  he is advised to stick to a routine mealtimes to eat 3 meals  a day and avoid unnecessary snacks ( to snack only to correct hypoglycemia).   -  Suggestion is made for him to avoid simple carbohydrates  from his diet including Cakes, Sweet Desserts / Pastries, Ice Cream, Soda (diet and regular), Sweet Tea, Candies, Chips, Cookies, Sweet Pastries,  Store Bought Juices, Alcohol in Excess of  1-2 drinks a day, Artificial Sweeteners, Coffee Creamer, and "Sugar-free" Products. This will help patient to have stable blood glucose profile and potentially avoid unintended weight gain.   - he has been  scheduled with Jearld Fenton, RDN, CDE for diabetes education.  - I have approached him with the following individualized plan to manage  his diabetes and patient agrees:   In light of his presentation with unintended weight loss, he will be better off without the jardiance. He is advised to d/c jardiance.   -#1 priority in this patient will be to avoid hypoglycemia.  He is advised to increase his [place]  to 12 units nightly.  He is encouraged to use his CGM continuously.  He is advised to discontinue glipizide at this time.  - he is encouraged to call clinic for blood glucose levels less than 70 or above 200 mg /dl.  - Specific targets for  A1c;  LDL, HDL, Triglycerides,  were discussed with the patient.  2)  Blood Pressure /Hypertension:   His blood pressure is controlled to target.  he is advised to continue his current medications including amlodipine 2.5 mg p.o. daily with breakfast .    3) Lipids/Hyperlipidemia:   Review of his recent lipid panel showed  controlled  LDL at 80 .  he  is advised to continue the statin 40 mg p.o. daily at bedtime.   Side effects and precautions discussed with him.  4)  Weight/Diet:  Body mass index is 20.56 kg/m.  -He is not a candidate for weight loss.   Exercise, and detailed carbohydrates information provided  -  detailed on discharge instructions.  5) Chronic Care/Health Maintenance:  -he  is on  Statin medications and  is encouraged to initiate and continue to follow up with Ophthalmology, Dentist,  Podiatrist at least yearly or according to recommendations, and advised to  stay away from smoking. I have recommended yearly flu vaccine and pneumonia vaccine at least every 5 years; moderate intensity exercise for up to 150 minutes weekly; and  sleep for at least 7 hours a day.  He was previously noted to have subclinical hypothyroidism. He will need repeat TFTs before his next visit.  - he is  advised to maintain close follow up with [facility] for primary care needs, as well as his other providers for optimal and coordinated care.    I spent 34 minutes in the care of the patient today including review of labs from [facility], Lipids, Thyroid Function, Hematology (current and previous including abstractions from other facilities); face-to-face time discussing  his blood glucose readings/logs, discussing hypoglycemia and hyperglycemia episodes and symptoms, medications doses, his options of short and long term treatment based on the latest standards of care / guidelines;  discussion about incorporating lifestyle medicine;  and documenting the encounter. Risk reduction counseling performed per USPSTF guidelines to reduce cardiovascular risk factors.      Please refer to Patient Instructions for Blood Glucose Monitoring and Insulin/Medications Dosing Guide"  in media tab for additional information. Please  also refer to " Patient Self Inventory" in the Media  tab for reviewed elements of pertinent patient history.  [name] participated in the discussions, expressed understanding, and voiced agreement with the above plans.  All questions were answered to his satisfaction. he is encouraged to contact clinic should he have any questions or concerns prior to his return visit.    Follow up plan: - Return in about 4 months (around 05/11/2022) for F/U with Pre-visit Labs, Meter/CGM/Logs, A1c here.  Glade Lloyd, MD [facility] [address] [place], [place] [address] Phone: [phone]  Fax: [phone]    01/10/2022, 4:07 PM  This note was partially dictated with voice recognition software. Similar sounding words can be transcribed inadequately or may not  be corrected upon review.

## 2022-01-10 NOTE — Patient Instructions (Signed)

## 2022-01-11 ENCOUNTER — Ambulatory Visit: Payer: Medicare Other | Admitting: "Endocrinology

## 2022-01-12 ENCOUNTER — Ambulatory Visit: Payer: Medicare Other | Admitting: "Endocrinology

## 2022-01-12 DIAGNOSIS — Z6824 Body mass index (BMI) 24.0-24.9, adult: Secondary | ICD-10-CM | POA: Diagnosis not present

## 2022-01-12 DIAGNOSIS — R634 Abnormal weight loss: Secondary | ICD-10-CM | POA: Diagnosis not present

## 2022-01-12 DIAGNOSIS — D696 Thrombocytopenia, unspecified: Secondary | ICD-10-CM | POA: Diagnosis not present

## 2022-01-12 DIAGNOSIS — I10 Essential (primary) hypertension: Secondary | ICD-10-CM | POA: Diagnosis not present

## 2022-01-12 DIAGNOSIS — E1169 Type 2 diabetes mellitus with other specified complication: Secondary | ICD-10-CM | POA: Diagnosis not present

## 2022-01-12 DIAGNOSIS — E1165 Type 2 diabetes mellitus with hyperglycemia: Secondary | ICD-10-CM | POA: Diagnosis not present

## 2022-01-12 DIAGNOSIS — Z87898 Personal history of other specified conditions: Secondary | ICD-10-CM | POA: Diagnosis not present

## 2022-01-24 ENCOUNTER — Emergency Department (HOSPITAL_COMMUNITY): Payer: Medicare Other

## 2022-01-24 ENCOUNTER — Emergency Department (HOSPITAL_COMMUNITY)
Admission: EM | Admit: 2022-01-24 | Discharge: 2022-01-25 | Disposition: A | Discharge status: Home / Self Care | Payer: Medicare Other | Attending: Emergency Medicine | Admitting: Emergency Medicine

## 2022-01-24 ENCOUNTER — Encounter (HOSPITAL_COMMUNITY): Payer: Self-pay | Admitting: Emergency Medicine

## 2022-01-24 ENCOUNTER — Other Ambulatory Visit: Payer: Self-pay

## 2022-01-24 DIAGNOSIS — R42 Dizziness and giddiness: Secondary | ICD-10-CM | POA: Insufficient documentation

## 2022-01-24 DIAGNOSIS — I2510 Atherosclerotic heart disease of native coronary artery without angina pectoris: Secondary | ICD-10-CM | POA: Diagnosis not present

## 2022-01-24 DIAGNOSIS — Y92009 Unspecified place in unspecified non-institutional (private) residence as the place of occurrence of the external cause: Secondary | ICD-10-CM | POA: Insufficient documentation

## 2022-01-24 DIAGNOSIS — I6523 Occlusion and stenosis of bilateral carotid arteries: Secondary | ICD-10-CM | POA: Diagnosis not present

## 2022-01-24 DIAGNOSIS — S199XXA Unspecified injury of neck, initial encounter: Secondary | ICD-10-CM | POA: Diagnosis not present

## 2022-01-24 DIAGNOSIS — U071 COVID-19: Secondary | ICD-10-CM | POA: Diagnosis not present

## 2022-01-24 DIAGNOSIS — Z6824 Body mass index (BMI) 24.0-24.9, adult: Secondary | ICD-10-CM | POA: Diagnosis not present

## 2022-01-24 DIAGNOSIS — Z7982 Long term (current) use of aspirin: Secondary | ICD-10-CM | POA: Diagnosis not present

## 2022-01-24 DIAGNOSIS — R531 Weakness: Secondary | ICD-10-CM | POA: Insufficient documentation

## 2022-01-24 DIAGNOSIS — Z79899 Other long term (current) drug therapy: Secondary | ICD-10-CM | POA: Insufficient documentation

## 2022-01-24 DIAGNOSIS — W010XXA Fall on same level from slipping, tripping and stumbling without subsequent striking against object, initial encounter: Secondary | ICD-10-CM | POA: Insufficient documentation

## 2022-01-24 DIAGNOSIS — E119 Type 2 diabetes mellitus without complications: Secondary | ICD-10-CM | POA: Insufficient documentation

## 2022-01-24 DIAGNOSIS — R519 Headache, unspecified: Secondary | ICD-10-CM | POA: Insufficient documentation

## 2022-01-24 DIAGNOSIS — I700 Atherosclerosis of aorta: Secondary | ICD-10-CM | POA: Diagnosis not present

## 2022-01-24 DIAGNOSIS — S0990XA Unspecified injury of head, initial encounter: Secondary | ICD-10-CM | POA: Diagnosis not present

## 2022-01-24 DIAGNOSIS — R030 Elevated blood-pressure reading, without diagnosis of hypertension: Secondary | ICD-10-CM | POA: Diagnosis not present

## 2022-01-24 DIAGNOSIS — Z9889 Other specified postprocedural states: Secondary | ICD-10-CM | POA: Diagnosis not present

## 2022-01-24 DIAGNOSIS — I10 Essential (primary) hypertension: Secondary | ICD-10-CM | POA: Diagnosis not present

## 2022-01-24 DIAGNOSIS — M4313 Spondylolisthesis, cervicothoracic region: Secondary | ICD-10-CM | POA: Diagnosis not present

## 2022-01-24 DIAGNOSIS — R630 Anorexia: Secondary | ICD-10-CM | POA: Diagnosis not present

## 2022-01-24 DIAGNOSIS — S299XXA Unspecified injury of thorax, initial encounter: Secondary | ICD-10-CM | POA: Diagnosis not present

## 2022-01-24 DIAGNOSIS — W19XXXA Unspecified fall, initial encounter: Secondary | ICD-10-CM

## 2022-01-24 DIAGNOSIS — I868 Varicose veins of other specified sites: Secondary | ICD-10-CM | POA: Diagnosis not present

## 2022-01-24 LAB — CBC
HCT: 34.1 % — ABNORMAL LOW (ref 39.0–52.0)
Hemoglobin: 11.6 g/dL — ABNORMAL LOW (ref 13.0–17.0)
MCH: 31.4 pg (ref 26.0–34.0)
MCHC: 34 g/dL (ref 30.0–36.0)
MCV: 92.4 fL (ref 80.0–100.0)
Platelets: 121 10*3/uL — ABNORMAL LOW (ref 150–400)
RBC: 3.69 MIL/uL — ABNORMAL LOW (ref 4.22–5.81)
RDW: 11.5 % (ref 11.5–15.5)
WBC: 4.5 10*3/uL (ref 4.0–10.5)
nRBC: 0 % (ref 0.0–0.2)

## 2022-01-24 LAB — COMPREHENSIVE METABOLIC PANEL
ALT: 24 U/L (ref 0–44)
AST: 32 U/L (ref 15–41)
Albumin: 3.6 g/dL (ref 3.5–5.0)
Alkaline Phosphatase: 43 U/L (ref 38–126)
Anion gap: 9 (ref 5–15)
BUN: 26 mg/dL — ABNORMAL HIGH (ref 8–23)
CO2: 25 mmol/L (ref 22–32)
Calcium: 8.5 mg/dL — ABNORMAL LOW (ref 8.9–10.3)
Chloride: 99 mmol/L (ref 98–111)
Creatinine, Ser: 1.23 mg/dL (ref 0.61–1.24)
GFR, Estimated: 57 mL/min — ABNORMAL LOW (ref >60)
Glucose, Bld: 210 mg/dL — ABNORMAL HIGH (ref 70–99)
Potassium: 3.9 mmol/L (ref 3.5–5.1)
Sodium: 133 mmol/L — ABNORMAL LOW (ref 135–145)
Total Bilirubin: 0.6 mg/dL (ref 0.3–1.2)
Total Protein: 6.5 g/dL (ref 6.5–8.1)

## 2022-01-24 LAB — I-STAT CHEM 8, ED
BUN: 25 mg/dL — ABNORMAL HIGH (ref 8–23)
Calcium, Ion: 1.15 mmol/L (ref 1.15–1.40)
Chloride: 100 mmol/L (ref 98–111)
Creatinine, Ser: 1.1 mg/dL (ref 0.61–1.24)
Glucose, Bld: 197 mg/dL — ABNORMAL HIGH (ref 70–99)
HCT: 34 % — ABNORMAL LOW (ref 39.0–52.0)
Hemoglobin: 11.6 g/dL — ABNORMAL LOW (ref 13.0–17.0)
Potassium: 3.9 mmol/L (ref 3.5–5.1)
Sodium: 136 mmol/L (ref 135–145)
TCO2: 23 mmol/L (ref 22–32)

## 2022-01-24 LAB — URINALYSIS, ROUTINE W REFLEX MICROSCOPIC
Bilirubin Urine: NEGATIVE
Glucose, UA: 50 mg/dL — AB
Hgb urine dipstick: NEGATIVE
Ketones, ur: NEGATIVE mg/dL
Leukocytes,Ua: NEGATIVE
Nitrite: NEGATIVE
Protein, ur: 100 mg/dL — AB
Specific Gravity, Urine: 1.023 (ref 1.005–1.030)
pH: 5 (ref 5.0–8.0)

## 2022-01-24 LAB — MAGNESIUM: Magnesium: 2 mg/dL (ref 1.7–2.4)

## 2022-01-24 LAB — CBG MONITORING, ED: Glucose-Capillary: 253 mg/dL — ABNORMAL HIGH (ref 70–99)

## 2022-01-24 LAB — PROTIME-INR
INR: 1 (ref 0.8–1.2)
Prothrombin Time: 13.4 seconds (ref 11.4–15.2)

## 2022-01-24 LAB — LACTIC ACID, PLASMA: Lactic Acid, Venous: 0.8 mmol/L (ref 0.5–1.9)

## 2022-01-24 NOTE — ED Notes (Signed)
Patient transported to CT 

## 2022-01-24 NOTE — ED Provider Notes (Signed)
[employer] Provider Note   CSN: [id number] Arrival date & time: 01/24/22  1807     History  Chief Complaint  Patient presents with   [name] is a 89 y.o. male.   Fall Associated symptoms include headaches.  Patient presents after a fall.  Medical history includes HTN, DM2, CAD, vasovagal syncope, HLD, GERD, prior episodes of dizziness, BPH.  He states that he is felt weak and dizzy for the past 2 days.  During a fall yesterday, he struck his head.  He was seen by PCP today.  He tested positive for COVID-19.  He was sent to the ED for evaluation of possible injuries.  Patient denies any recent vomiting or diarrhea.  He has a chronically poor appetite.  He has had a headache throughout the day.  He describes this as bitemporal.  Over the past 2 days, he has a decreased and generalized weakness.  He is typically an active person.     Home Medications Prior to Admission medications   Medication Sig Start Date End Date Taking? Authorizing Provider  acetaminophen (TYLENOL) 500 MG tablet Take 1,000 mg by mouth every 6 (six) hours as needed for mild pain or headache.    [provider]  albuterol (VENTOLIN HFA) 108 (90 Base) MCG/ACT inhaler Inhale 2 puffs into the lungs every 6 (six) hours as needed for wheezing or shortness of breath. 03/04/20   [provider]  amLODipine (NORVASC) 5 MG tablet Take 1 tablet (5 mg total) by mouth daily. 10/23/21   Orson Eva, MD  aspirin EC 81 MG tablet Take 81 mg by mouth daily with breakfast.     [provider]  Brinzolamide-Brimonidine 1-0.2 % SUSP Place 1 drop into the right eye 3 (three) times daily.    [provider]  Cholecalciferol (VITAMIN D) 2000 units tablet Take 2,000 Units by mouth daily with breakfast.    [provider]  clopidogrel (PLAVIX) 75 MG tablet Take 75 mg by mouth daily.    [provider]  diclofenac Sodium  (VOLTAREN) 1 % GEL Apply 2 g topically 4 (four) times daily.    [provider]  doxazosin (CARDURA) 8 MG tablet Take 1 tablet (8 mg total) by mouth at bedtime. 02/05/21   Barton Dubois, MD  finasteride (PROSCAR) 5 MG tablet Take 5 mg by mouth daily. 01/15/21   [provider]  gabapentin (NEURONTIN) 300 MG capsule Take 900 mg by mouth 3 (three) times daily.    [provider]  isosorbide mononitrate (IMDUR) 60 MG 24 hr tablet Take 1 tablet (60 mg total) by mouth daily. 10/23/21   Orson Eva, MD  nitroGLYCERIN (NITROSTAT) 0.4 MG SL tablet Place 1 tablet (0.4 mg total) under the tongue every 5 (five) minutes as needed for up to 25 days for chest pain. And increase blood pressure greater than 517 systolic 0/01/74 94/49/67  Leonie Man, MD  Omega-3 Fatty Acids (FISH OIL) 1000 MG CAPS Take 1,000 mg by mouth 2 (two) times daily with a meal.     [provider]  pantoprazole (PROTONIX) 40 MG tablet Take 1 tablet (40 mg total) by mouth 2 (two) times daily before a meal. Patient taking differently: Take 40 mg by mouth daily. 11/30/21   Annitta Needs, NP  prednisoLONE acetate (PRED FORTE) 1 % ophthalmic suspension Place 1 drop into the left eye daily.    [provider]  ranolazine (RANEXA) 500 MG 12 hr tablet Take 1 tablet (500 mg total) by mouth 2 (two) times daily. 10/22/21   Orson Eva, MD  rosuvastatin (CRESTOR) 40 MG tablet TAKE 1 TABLET BY MOUTH DAILY 03/26/21   Leonie Man, MD  TRESIBA FLEXTOUCH 100 UNIT/ML FlexTouch Pen Inject 16 Units into the skin daily with breakfast. 02/16/21   [provider]  vitamin B-12 (VITAMIN B12) 500 MCG tablet Take 1 tablet (500 mcg total) by mouth daily. 10/23/21   Orson Eva, MD  vitamin C (ASCORBIC ACID) 500 MG tablet Take 500 mg by mouth 2 (two) times daily with a meal.     [provider]      Allergies    Iohexol, Contrast media [iodinated contrast media], and Metformin and related    Review  of Systems   Review of Systems  Constitutional:  Positive for activity change and fatigue.  Neurological:  Positive for dizziness, weakness (Generalized) and headaches.  All other systems reviewed and are negative.   Physical Exam Updated Vital Signs BP (!) 134/53   Pulse 60   Temp 98.4 F (36.9 C) (Oral)   Resp 15   Ht 5\' 6"  (1.676 m)   Wt 55.3 kg   SpO2 95%   BMI 19.69 kg/m  Physical Exam Vitals and nursing note reviewed.  Constitutional:      General: He is not in acute distress.    Appearance: Normal appearance. He is well-developed. He is not ill-appearing, toxic-appearing or diaphoretic.  HENT:     Head: Normocephalic.     Comments: Left temporal hematoma    Right Ear: External ear normal. There is no impacted cerumen.     Left Ear: External ear normal.     Nose: Nose normal.     Mouth/Throat:     Mouth: Mucous membranes are moist.     Pharynx: Oropharynx is clear.  Eyes:     Extraocular Movements: Extraocular movements intact.     Conjunctiva/sclera: Conjunctivae normal.     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  Cardiovascular:     Rate and Rhythm: Normal rate and regular rhythm.     Heart sounds: No murmur heard. Pulmonary:     Effort: Pulmonary effort is normal. No respiratory distress.     Breath sounds: Normal breath sounds. No wheezing or rales.  Chest:     Chest wall: No tenderness.  Abdominal:     General: There is no distension.     Palpations: Abdomen is soft.     Tenderness: There is no abdominal tenderness.  Musculoskeletal:        General: No swelling or deformity. Normal range of motion.     Cervical back: Normal range of motion and neck supple. Tenderness present.     Right lower leg: No edema.     Left lower leg: No edema.  Skin:    General: Skin is warm and dry.     Coloration: Skin is not jaundiced or pale.  Neurological:     General: No focal deficit present.     Mental Status: He is alert and oriented to person, place, and  time.     Cranial Nerves: No cranial nerve deficit.     Sensory: No sensory deficit.     Motor: No weakness.     Coordination: Coordination normal.  Psychiatric:        Mood and Affect: Mood normal.        Behavior: Behavior normal.  Thought Content: Thought content normal.        Judgment: Judgment normal.     ED Results / Procedures / Treatments   Labs (all labs ordered are listed, but only abnormal results are displayed) Labs Reviewed  COMPREHENSIVE METABOLIC PANEL - Abnormal; Notable for the following components:      Result Value   Sodium 133 (*)    Glucose, Bld 210 (*)    BUN 26 (*)    Calcium 8.5 (*)    GFR, Estimated 57 (*)    All other components within normal limits  CBC - Abnormal; Notable for the following components:   RBC 3.69 (*)    Hemoglobin 11.6 (*)    HCT 34.1 (*)    Platelets 121 (*)    All other components within normal limits  URINALYSIS, ROUTINE W REFLEX MICROSCOPIC - Abnormal; Notable for the following components:   Glucose, UA 50 (*)    Protein, ur 100 (*)    Bacteria, UA RARE (*)    All other components within normal limits  CBG MONITORING, ED - Abnormal; Notable for the following components:   Glucose-Capillary 253 (*)    All other components within normal limits  I-STAT CHEM 8, ED - Abnormal; Notable for the following components:   BUN 25 (*)    Glucose, Bld 197 (*)    Hemoglobin 11.6 (*)    HCT 34.0 (*)    All other components within normal limits  LACTIC ACID, PLASMA  PROTIME-INR  MAGNESIUM    EKG None  Radiology DG Chest Port 1 View  Result Date: 01/24/2022 CLINICAL DATA:  Trauma EXAM: PORTABLE CHEST 1 VIEW COMPARISON:  10/21/2021 FINDINGS: Post sternotomy changes. No acute airspace disease or effusion. Stable cardiomediastinal silhouette with aortic atherosclerosis. No pneumothorax. Stent superior to the aortic arch. IMPRESSION: No active disease. Electronically Signed   By: Donavan Foil M.D.   On: 01/24/2022 23:46   CT  CERVICAL SPINE WO CONTRAST  Result Date: 01/24/2022 CLINICAL DATA:  Status post trauma. EXAM: CT CERVICAL SPINE WITHOUT CONTRAST TECHNIQUE: Multidetector CT imaging of the cervical spine was performed without intravenous contrast. Multiplanar CT image reconstructions were also generated. RADIATION DOSE REDUCTION: This exam was performed according to the departmental dose-optimization program which includes automated exposure control, adjustment of the mA and/or kV according to patient size and/or use of iterative reconstruction technique. COMPARISON:  None Available. FINDINGS: Alignment: There is approximately 1 mm anterolisthesis of the C7 vertebral body on T1. Skull base and vertebrae: No acute fracture. Degenerative changes are seen involving the tip of the dens and the adjacent portion of the anterior arch of C1. Soft tissues and spinal canal: No prevertebral fluid or swelling. No visible canal hematoma. Disc levels: Marked severity endplate sclerosis, moderate severity anterior osteophyte formation and posterior bony spurring are seen at the levels of C3-C4, C4-C5, C5-C6 and C6-C7. There is marked severity narrowing of the anterior atlantoaxial articulation. Marked severity intervertebral disc space narrowing is seen at C3-C4, C4-C5, C5-C6 and C6-C7. Bilateral moderate severity multilevel facet joint hypertrophy is noted. Upper chest: Mild biapical scarring and/or atelectasis is seen. Other: There is asymmetric dilatation of the right internal jugular vein which measures 1.9 cm in diameter. Marked severity calcification of the bilateral common carotid arteries and carotid bulbs is seen. IMPRESSION: 1. No acute cervical spine fracture. 2. Marked severity multilevel degenerative changes, as described above. 3. 1 mm anterolisthesis of the C7 vertebral body on T1, likely chronic. Electronically Signed  By: Virgina Norfolk M.D.   On: 01/24/2022 23:45   CT HEAD WO CONTRAST  Result Date: 01/24/2022 CLINICAL  DATA:  Trauma EXAM: CT HEAD WITHOUT CONTRAST TECHNIQUE: Contiguous axial images were obtained from the base of the skull through the vertex without intravenous contrast. RADIATION DOSE REDUCTION: This exam was performed according to the departmental dose-optimization program which includes automated exposure control, adjustment of the mA and/or kV according to patient size and/or use of iterative reconstruction technique. COMPARISON:  CT head 02/03/2021 FINDINGS: Brain: No evidence of acute infarction, hemorrhage, hydrocephalus, extra-axial collection or mass lesion/mass effect. Vascular: Atherosclerotic calcifications are present within the cavernous internal carotid arteries. Skull: Normal. Negative for fracture or focal lesion. Sinuses/Orbits: No acute finding. Postsurgical changes in the left orbit are similar to prior. Other: None. IMPRESSION: No acute intracranial abnormality. Electronically Signed   By: Ronney Asters M.D.   On: 01/24/2022 23:40    Procedures Procedures    Medications Ordered in ED Medications  ketorolac (TORADOL) 15 MG/ML injection 15 mg (has no administration in time range)    ED Course/ Medical Decision Making/ A&P                             Medical Decision Making Amount and/or Complexity of Data Reviewed Labs: ordered. Radiology: ordered.   This patient presents to the ED for concern of fall, this involves an extensive number of treatment options, and is a complaint that carries with it a high risk of complications and morbidity.  The differential diagnosis includes acute injuries   Co morbidities that complicate the patient evaluation  HTN, DM2, CAD, vasovagal syncope, HLD, GERD, prior episodes of dizziness, BPH   Additional history obtained:  Additional history obtained from patient's daughter External records from outside source obtained and reviewed including EMR   Lab Tests:  I Ordered, and personally interpreted labs.  The pertinent results  include: Baseline anemia, no leukocytosis, normal electrolytes, no evidence of UTI   Imaging Studies ordered:  I ordered imaging studies including chest x-ray, CT of head and cervical spine I independently visualized and interpreted imaging which showed no acute findings I agree with the radiologist interpretation   Cardiac Monitoring: / EKG:  The patient was maintained on a cardiac monitor.  I personally viewed and interpreted the cardiac monitored which showed an underlying rhythm of: Sinus rhythm  Problem List / ED Course / Critical interventions / Medication management  Patient presents for fatigue, generalized weakness, dizziness for the past 2 days.  Due to the symptoms, he had an unwitnessed fall yesterday afternoon.  Since that time, he has had a bitemporal headache.  He was seen by PCP today and diagnosed with COVID-19.  He denies any recent cough, shortness of breath, vomiting, or diarrhea.  On arrival in the ED, blood pressures are soft.  He has mild bradycardia.  On cardiac monitor, this is normal sinus rhythm.  He has a small hematoma to the left temporal area of the scalp.  Skin is intact.  He endorses a bitemporal headache.  He is not on any blood thinners.  CT imaging of head and cervical spine were ordered to assess for acute injuries from his fall yesterday.  Laboratory workup was initiated for underlying reasons for his recent generalized weakness other than COVID.  Patient's lab work and imaging studies are reassuring.  Generalized weakness likely secondary to COVID-19 illness.  Additionally, patient likely sustained a concussion from  his fall yesterday.  He was advised to continue Tylenol and ibuprofen as needed.  Per his daughter, PCP has already prescribed antiviral medication for his COVID-19.  Patient was discharged in stable condition. I ordered medication including Toradol for analgesia Reevaluation of the patient after these medicines showed that the patient improved I  have reviewed the patients home medicines and have made adjustments as needed   Social Determinants of Health:  Has PCP        Final Clinical Impression(s) / ED Diagnoses Final diagnoses:  Fall, initial encounter    Rx / DC Orders ED Discharge Orders     None         Godfrey Pick, MD 01/25/22 [phone]

## 2022-01-24 NOTE — ED Triage Notes (Signed)
Pt has been feeling weak and dizzy since Saturday. Pt fell at home yesterday and struck his head. Pt seen pcp today and diagnosed with covid and sent here for head ct.

## 2022-01-24 NOTE — ED Notes (Signed)
Pt given urinal to use at bedside whenever pt can urinate

## 2022-01-24 NOTE — ED Notes (Signed)
ED Provider at bedside. 

## 2022-01-25 ENCOUNTER — Ambulatory Visit: Payer: Medicare Other | Admitting: Gastroenterology

## 2022-01-25 MED ORDER — IBUPROFEN 400 MG PO TABS
600.0000 mg | ORAL_TABLET | Freq: Once | ORAL | Status: AC
  Administered 2022-01-25: 600 mg via ORAL
  Filled 2022-01-25: qty 2

## 2022-01-25 MED ORDER — KETOROLAC TROMETHAMINE 15 MG/ML IJ SOLN
15.0000 mg | Freq: Once | INTRAMUSCULAR | Status: DC
  Filled 2022-01-25: qty 1

## 2022-01-25 NOTE — Discharge Instructions (Addendum)
Your CT scans did not show any fractures or bleeding.  I suspect that you sustained a concussion from a fall yesterday.  Additionally, your COVID infection is likely contributing to your recent weakness.  Take Tylenol and ibuprofen as needed for pain and soreness.  Follow-up with your primary care doctor when possible.  Return to the emergency department for any new or worsening symptoms of concern.

## 2022-01-31 DIAGNOSIS — R410 Disorientation, unspecified: Secondary | ICD-10-CM | POA: Diagnosis not present

## 2022-01-31 DIAGNOSIS — N390 Urinary tract infection, site not specified: Secondary | ICD-10-CM | POA: Diagnosis not present

## 2022-01-31 DIAGNOSIS — R109 Unspecified abdominal pain: Secondary | ICD-10-CM | POA: Diagnosis not present

## 2022-01-31 DIAGNOSIS — Z951 Presence of aortocoronary bypass graft: Secondary | ICD-10-CM | POA: Diagnosis not present

## 2022-01-31 DIAGNOSIS — M5136 Other intervertebral disc degeneration, lumbar region: Secondary | ICD-10-CM | POA: Diagnosis not present

## 2022-01-31 DIAGNOSIS — I700 Atherosclerosis of aorta: Secondary | ICD-10-CM | POA: Diagnosis not present

## 2022-01-31 DIAGNOSIS — E162 Hypoglycemia, unspecified: Secondary | ICD-10-CM | POA: Diagnosis not present

## 2022-01-31 DIAGNOSIS — K5730 Diverticulosis of large intestine without perforation or abscess without bleeding: Secondary | ICD-10-CM | POA: Diagnosis not present

## 2022-01-31 DIAGNOSIS — M47816 Spondylosis without myelopathy or radiculopathy, lumbar region: Secondary | ICD-10-CM | POA: Diagnosis not present

## 2022-01-31 DIAGNOSIS — S0990XA Unspecified injury of head, initial encounter: Secondary | ICD-10-CM | POA: Diagnosis not present

## 2022-01-31 DIAGNOSIS — R030 Elevated blood-pressure reading, without diagnosis of hypertension: Secondary | ICD-10-CM | POA: Diagnosis not present

## 2022-01-31 DIAGNOSIS — N2889 Other specified disorders of kidney and ureter: Secondary | ICD-10-CM | POA: Diagnosis not present

## 2022-01-31 DIAGNOSIS — Z6823 Body mass index (BMI) 23.0-23.9, adult: Secondary | ICD-10-CM | POA: Diagnosis not present

## 2022-02-07 DIAGNOSIS — I10 Essential (primary) hypertension: Secondary | ICD-10-CM | POA: Diagnosis not present

## 2022-02-07 DIAGNOSIS — E162 Hypoglycemia, unspecified: Secondary | ICD-10-CM | POA: Diagnosis not present

## 2022-02-07 DIAGNOSIS — E1169 Type 2 diabetes mellitus with other specified complication: Secondary | ICD-10-CM | POA: Diagnosis not present

## 2022-02-07 DIAGNOSIS — N390 Urinary tract infection, site not specified: Secondary | ICD-10-CM | POA: Diagnosis not present

## 2022-02-07 DIAGNOSIS — S0990XA Unspecified injury of head, initial encounter: Secondary | ICD-10-CM | POA: Diagnosis not present

## 2022-02-11 ENCOUNTER — Ambulatory Visit: Payer: Medicare Other | Attending: Nurse Practitioner | Admitting: Nurse Practitioner

## 2022-02-11 ENCOUNTER — Encounter: Payer: Self-pay | Admitting: Nurse Practitioner

## 2022-02-11 VITALS — BP 106/48 | HR 68 | Ht 67.0 in | Wt 123.0 lb

## 2022-02-11 DIAGNOSIS — Z794 Long term (current) use of insulin: Secondary | ICD-10-CM

## 2022-02-11 DIAGNOSIS — I10 Essential (primary) hypertension: Secondary | ICD-10-CM

## 2022-02-11 DIAGNOSIS — I340 Nonrheumatic mitral (valve) insufficiency: Secondary | ICD-10-CM

## 2022-02-11 DIAGNOSIS — I2510 Atherosclerotic heart disease of native coronary artery without angina pectoris: Secondary | ICD-10-CM | POA: Diagnosis not present

## 2022-02-11 DIAGNOSIS — R55 Syncope and collapse: Secondary | ICD-10-CM

## 2022-02-11 DIAGNOSIS — E785 Hyperlipidemia, unspecified: Secondary | ICD-10-CM

## 2022-02-11 DIAGNOSIS — I739 Peripheral vascular disease, unspecified: Secondary | ICD-10-CM | POA: Diagnosis not present

## 2022-02-11 DIAGNOSIS — E118 Type 2 diabetes mellitus with unspecified complications: Secondary | ICD-10-CM

## 2022-02-11 DIAGNOSIS — I951 Orthostatic hypotension: Secondary | ICD-10-CM

## 2022-02-11 MED ORDER — ISOSORBIDE MONONITRATE ER 30 MG PO TB24: 30.0000 mg | ORAL_TABLET | Freq: Every day | ORAL | 3 refills | Status: AC

## 2022-02-11 MED ORDER — ISOSORBIDE MONONITRATE ER 30 MG PO TB24: 30.0000 mg | ORAL_TABLET | Freq: Every day | ORAL | 3 refills | Status: DC

## 2022-02-11 NOTE — Progress Notes (Unsigned)
Office Visit    Patient Name: [name] Date of Encounter: 02/11/2022  Primary Care Provider:  [facility] Cardiologist:  Glenetta Hew, MD  Chief Complaint    89 year old male with a history of CAD s/p CABG x 3 (LIMA-LAD, SVG-OM 2, SVG-RPDA) in 1995, redo CABG x 1 (left radial graft to distal LAD due to atretic LIMA and unsuccessful attempt at revascularization) in 06/1999, PAD s/p stenting of the left vertebral artery in 2002 with redo stent in 2005, stenting of left subclavian artery in 2005, vasovagal syncope, hypertension with orthostatic hypotension, hyperlipidemia, type 2 diabetes, and glaucoma who presents for follow-up related to CAD, hypertension,  and near syncope.  Past Medical History    Past Medical History:  Diagnosis Date   Arthritis    Asymptomatic stenosis of left vertebral artery 2002, 2005   Status post PTA/stent with redo; normal antegrade flow on dopplers 09/2013   CAD (coronary artery disease) 1995   1CABG x3; redo in CABG x 1 2001 Free Radial to LAD; All grafts patent by Cath 08/2014: The proximal to mid LAD has poor retrograde filling from the LIMA due to in-stent restenosis. 50% ostial disease of free radial artery to the LAD.   CAD (coronary artery disease) of artery bypass graft 2000   PCI x 2 - ostial & prox-mid LAD (BMS) for atretic LIMA-LAD   CAD in native artery 1995   CABG x 3 - LIMA-LAD, SVG-OM2, SVG-rPDA   Chest pain 09/2017   GERD (gastroesophageal reflux disease)    Glaucoma    Hyperlipidemia    Hypertension, essential    S/P CABG x 3 1995   S/P Redo CABG x 1 2001   L Radial-LAD after 2 failed attempts @ LIMA-LAD PTCA; LAD stents 100% occluded   Stenosis of left subclavian artery ([place]) 11/2003   Status post PTA/stent --> < 50 % stenosis by Dopplers 09/2013   Type 2 diabetes mellitus ([place]) 2002   Past Surgical History:  Procedure Laterality Date   BALLOON DILATION N/A 07/07/2021   Procedure: BALLOON DILATION;   Surgeon: Eloise Harman, DO;  Location: [facility];  Service: Endoscopy;  Laterality: N/A;   CARDIAC CATHETERIZATION N/A 08/04/2014   Procedure: Left Heart Cath and Cors/Grafts Angiography;  Surgeon: Leonie Man, MD;  Location: [place] [facility];  Service: Cardiovascular: o-mRCA 70%, m-dRCA ~70% - SVG-dRCA Patent.  o-pLAD 100% stent occluded, mLAD 95% ISR with poor retrograde filling from freeRadiial graft-LAD (50% ostial graft dz).  o-p Cx 80%. Widely patent SVG-Cx-OM fills retrograde to 80% lesion.; Normal LV Fxn & EDP   CORONARY ANGIOPLASTY  April and May 2001   After Both LAD stents occluded - PTCA of anastomatic LIMA-LAD lesion -- Unsuccessful.   CORONARY ARTERY BYPASS GRAFT  1995   LIMA-LAD, SVG-OM2, SVG-rPDA   CORONARY ARTERY BYPASS GRAFT  06/1999   Dr. Lucianne Lei Trigt: Redo LAD grafting with free Left Radial-distal LAD   CORONARY STENT PLACEMENT  1995-2000   2 BMS stents to osital-proximal & proximal-mid LAD; because of atretic LIMA-LDA   ESOPHAGEAL BRUSHING  07/07/2021   Procedure: ESOPHAGEAL BRUSHING;  Surgeon: Eloise Harman, DO;  Location: [facility];  Service: Endoscopy;;   ESOPHAGOGASTRODUODENOSCOPY (EGD) WITH PROPOFOL N/A 07/07/2021   non-severe candida esophagitis, mild Schatzki ring s/p dilation, normal stomach and duodenal bulb, retained food in duodenum.   LEFT HEART CATH AND CORONARY ANGIOGRAPHY N/A 09/18/2017   Procedure: LEFT HEART CATH AND CORONARY ANGIOGRAPHY;  Surgeon: Martinique, Peter M, MD;  Location: [facility] [facility];  Service: Cardiovascular;  Laterality: N/A;   LEFT HEART CATHETERIZATION WITH CORONARY/GRAFT ANGIOGRAM N/A 08/22/2011   Procedure: LEFT HEART CATHETERIZATION WITH Beatrix Fetters;  Surgeon: Leonie Man, MD;  Location: [facility];  Service: Cardiovascular:  Known occluded LIMA-LAD and ostial LAD. Moderate to severe proximal circumflex and RCA disease. Widely patent freeLRAD-dLAD, as well as SVG-RPDA (backfilling RPL), SVG-OM 2  (backfilling OM1)   SHOULDER OPEN ROTATOR CUFF REPAIR  10/2002   Dr. Noemi Chapel   SP [facility] VERT OR THOR CAROTID STENT Left October 2002; November 2005   Left Vertebral stent placed in October 2002 (Dr. Patrecia Pour); redo PCI in 2005   [place] Left 11/2003   Dr. Gwenlyn Found   TRANSTHORACIC ECHOCARDIOGRAM  04/10/2020   [facility]): Normal LV size and function.  EF 60 to 65%.  GR 1 DD.  Mild AI.  Mild to moderate LA dilation.  GR 1 DD.[name]   TRANSTHORACIC ECHOCARDIOGRAM  02/2021   EF 70 to 75%.  Hyperdynamic.  No RWMA.  GR 1 DD.  Normal RV, RVP and low normal RAP.  Normal valves.    Allergies  Allergies  Allergen Reactions   Iohexol Other (See Comments)    Intractable shaking.   Contrast Media [Iodinated Contrast Media]     Shivering & shaking. Pt reports takes antihistamine prior to procedures.   Metformin And Related Diarrhea    Subsequently discontinued     Labs/Other Studies Reviewed    The following studies were reviewed today:  Left Cardiac Catheterization 09/18/2017: Prox RCA to Mid RCA lesion is 80% stenosed. Dist RCA lesion is 50% stenosed. SVG graft was visualized by angiography and is normal in caliber. Ost LM to Dist LM lesion is 90% stenosed. Ost Cx to Prox Cx lesion is 90% stenosed. Ost LAD to Prox LAD lesion is 100% stenosed. Prox LAD to Mid LAD lesion is 100% stenosed. SVG graft was visualized by angiography and is normal in caliber. The graft exhibits no disease. Left radial artery graft was visualized by angiography and is normal in caliber. The graft exhibits no disease. The left ventricular systolic function is normal. LV end diastolic pressure is normal. The left ventricular ejection fraction is greater than 65% by visual estimate.   1. Severe left main and 3 vessel obstructive CAD 2. Patent free radial graft to the LAD 3. Patent SVG to OM1 4. Patent SVG to distal RCA 5. Normal LV function 6. Normal LVEDP   Plan: continue medical therapy.  I do not see a coronary cause for his chest pain.    Diagnostic Dominance: Right      _______________   Echocardiogram 10/21/2021: Impressions:  1. Left ventricular ejection fraction, by estimation, is 60 to 65%. The  left ventricle has normal function. The left ventricle has no regional  wall motion abnormalities. Left ventricular diastolic parameters were  normal.   2. Right ventricular systolic function is normal. The right ventricular  size is normal.   3. Left atrial size was mildly dilated.   4. The mitral valve is normal in structure. Mild mitral valve  regurgitation. No evidence of mitral stenosis.   5. The aortic valve is tricuspid. Aortic valve regurgitation is trivial.  No aortic stenosis is present.   6. The inferior vena cava is normal in size with greater than 50%  respiratory variability, suggesting right atrial pressure of 3 mmHg.  _______________   Carotid Ultrasound 11/01/2021: Summary: Right  Carotid: Velocities in the right ICA are consistent with a 1-39%  stenosis.    Left Carotid: Velocities in the left ICA are consistent with a 1-39%  stenosis.   Vertebrals: Left vertebral artery demonstrates antegrade flow. Right  Vertebral artery demonstrates antegrade high resistant flow.   Subclavians: Normal flow hemodynamics were seen in bilateral subclavian arteries.    Recent Labs: 10/21/2021: TSH 4.473 01/24/2022: ALT 24; BUN 25; Creatinine, Ser 1.10; Hemoglobin 11.6; Magnesium 2.0; Platelets 121; Potassium 3.9; Sodium 136  Recent Lipid Panel    Component Value Date/Time   CHOL 108 07/21/2021 0000   TRIG 125 07/21/2021 0000   HDL 36 07/21/2021 0000   CHOLHDL 4.5 09/18/2017 0543   VLDL 22 09/18/2017 0543   LDLCALC 50 07/21/2021 0000    History of Present Illness    Chief Complaint    89 year old male with the above past medical history including CAD s/p CABG x 3 (LIMA-LAD, SVG-OM 2, SVG-RPDA) in 1995, redo CABG x 1 (left radial graft to distal LAD  due to atretic LIMA and failed attempt at revascularization) in 06/1999, PAD s/p stenting of the left vertebral artery in 2002 with redo stent in 2005, stenting of left subclavian artery in 2005, vasovagal syncope, hypertension with orthostatic hypotension, hyperlipidemia, type 2 diabetes, and glaucoma.  Cardiac catheterization in 2019 showed severe native CAD with patent grafts.  Continued medical therapy was recommended.  He was hospitalized in 02/2021 in the setting of syncope that occurred when he went to the bathroom and stood up after having a bowel movement.  Echo at the time showed EF 70 to 75%, G1 DD, no significant valvular disease.  CTA chest was negative for PE.  Syncope was felt to be vasovagal in nature due to difficultly initiating urinary stream in the setting of BPH.  Amlodipine was discontinued, however doxazosin and finasteride were continued.  He was started on Florinef in the setting of orthostatic hypotension.  He was hospitalized in 10/2021 for near syncope that occurred after getting up from the bed to use the restroom.  He was no longer taking Florinef at the time.  He was positive for orthostatic hypotension.  Repeat echocardiogram showed EF 60 to 65%, no RWMA, normal diastolic parameters, normal RV, mild MR.  He was started on Ranexa for possible angina, Imdur was decreased to 60 mg daily.  He was restarted on amlodipine given poorly controlled hypertension at baseline.  He was last seen in the office on 11/10/2021 and was stable overall from a cardiac standpoint.  He reported stable "usual" chest pain around his sternotomy site. He also reported labile blood sugar.  He was seen in the ED on 01/24/2022 in the setting of generalized weakness, fatigue, dizziness, and unwitnessed fall.  He also reported a headache. CT of the head was negative, CT of the cervical spine showed no acute fracture. Chest x-ray was unremarkable.  He was diagnosed with COVID-19 per PCP the same day.   He presents  today for follow-up accompanied by his daughter. Since his last visit he has been stable from a cardiac standpoint.  He continues to have labile BP though more recently BP has been low.  Amlodipine was recently discontinued per PCP.  Doxazosin was decreased to 4 mg daily (half of his previous dose). He denies any recent presyncope, syncope.  He is struggling with elevated blood glucose readings.  He recently had his insulin dose increased.  He is following closely with his PCP for this.  He  denies chest pain, dyspnea, palpitations, dizziness, edema, PND, orthopnea, weight gain.  Home Medications    Current Outpatient Medications  Medication Sig Dispense Refill   acetaminophen (TYLENOL) 500 MG tablet Take 1,000 mg by mouth every 6 (six) hours as needed for mild pain or headache.     albuterol (VENTOLIN HFA) 108 (90 Base) MCG/ACT inhaler Inhale 2 puffs into the lungs every 6 (six) hours as needed for wheezing or shortness of breath.     aspirin EC 81 MG tablet Take 81 mg by mouth daily with breakfast.      Brinzolamide-Brimonidine 1-0.2 % SUSP Place 1 drop into the right eye 3 (three) times daily.     Cholecalciferol (VITAMIN D) 2000 units tablet Take 2,000 Units by mouth daily with breakfast.     clopidogrel (PLAVIX) 75 MG tablet Take 75 mg by mouth daily.     diclofenac Sodium (VOLTAREN) 1 % GEL Apply 2 g topically 4 (four) times daily.     doxazosin (CARDURA) 8 MG tablet Take 1 tablet (8 mg total) by mouth at bedtime. (Patient taking differently: Take 4 mg by mouth at bedtime.) 30 tablet 1   doxycycline (VIBRA-TABS) 100 MG tablet Take 100 mg by mouth 2 (two) times daily.     finasteride (PROSCAR) 5 MG tablet Take 5 mg by mouth daily.     gabapentin (NEURONTIN) 300 MG capsule Take 900 mg by mouth 3 (three) times daily.     HUMALOG KWIKPEN 100 UNIT/ML KwikPen Inject 2 Units into the skin 2 (two) times daily.     Omega-3 Fatty Acids (FISH OIL) 1000 MG CAPS Take 1,000 mg by mouth 2 (two) times daily  with a meal.      pantoprazole (PROTONIX) 40 MG tablet Take 1 tablet (40 mg total) by mouth 2 (two) times daily before a meal. (Patient taking differently: Take 40 mg by mouth daily.) 60 tablet 3   prednisoLONE acetate (PRED FORTE) 1 % ophthalmic suspension Place 1 drop into the left eye daily.     ranolazine (RANEXA) 500 MG 12 hr tablet Take 1 tablet (500 mg total) by mouth 2 (two) times daily. 60 tablet 1   rosuvastatin (CRESTOR) 40 MG tablet TAKE 1 TABLET BY MOUTH DAILY 90 tablet 3   TRESIBA FLEXTOUCH 100 UNIT/ML FlexTouch Pen Inject 8 Units into the skin daily with breakfast.     vitamin B-12 (VITAMIN B12) 500 MCG tablet Take 1 tablet (500 mcg total) by mouth daily.     vitamin C (ASCORBIC ACID) 500 MG tablet Take 500 mg by mouth 2 (two) times daily with a meal.      isosorbide mononitrate (IMDUR) 30 MG 24 hr tablet Take 1 tablet (30 mg total) by mouth daily. 90 tablet 3   nitroGLYCERIN (NITROSTAT) 0.4 MG SL tablet Place 1 tablet (0.4 mg total) under the tongue every 5 (five) minutes as needed for up to 25 days for chest pain. And increase blood pressure greater than [id number] systolic 25 tablet 6   No current facility-administered medications for this visit.     Review of Systems    He denies chest pain, palpitations, dyspnea, pnd, orthopnea, n, v, dizziness, syncope, edema, weight gain, or early satiety. All other systems reviewed and are otherwise negative except as noted above.   Physical Exam    VS:  BP (!) 106/48   Pulse 68   Ht 5' 7"$  (1.702 m)   Wt 123 lb (55.8 kg)   SpO2 96%  BMI 19.26 kg/m   GEN: Well nourished, well developed, in no acute distress. HEENT: normal. Neck: Supple, no JVD, carotid bruits, or masses. Cardiac: RRR, no murmurs, rubs, or gallops. No clubbing, cyanosis, edema.  Radials/DP/PT 2+ and equal bilaterally.  Respiratory:  Respirations regular and unlabored, clear to auscultation bilaterally. GI: Soft, nontender, nondistended, BS + x 4. MS: no deformity or  atrophy. Skin: warm and dry, no rash. Neuro:  Strength and sensation are intact. Psych: Normal affect.  Accessory Clinical Findings    ECG personally reviewed by me today - No EKG in office today.     Lab Results  Component Value Date   WBC 4.5 01/24/2022   HGB 11.6 (L) 01/24/2022   HCT 34.0 (L) 01/24/2022   MCV 92.4 01/24/2022   PLT 121 (L) 01/24/2022   Lab Results  Component Value Date   CREATININE 1.10 01/24/2022   BUN 25 (H) 01/24/2022   NA 136 01/24/2022   K 3.9 01/24/2022   CL 100 01/24/2022   CO2 25 01/24/2022   Lab Results  Component Value Date   ALT 24 01/24/2022   AST 32 01/24/2022   ALKPHOS 43 01/24/2022   BILITOT 0.6 01/24/2022   Lab Results  Component Value Date   CHOL 108 07/21/2021   HDL 36 07/21/2021   LDLCALC 50 07/21/2021   TRIG 125 07/21/2021   CHOLHDL 4.5 09/18/2017    Lab Results  Component Value Date   HGBA1C 7.8 (A) 09/30/2021    Assessment & Plan    1. CAD: S/p CABG x 3 (LIMA-LAD, SVG-OM 2, SVG-RPDA) in 1995, redo CABG x 1 (left radial  graft to distal LAD due to atretic LIMA and failed attempt at revascularization) in 06/1999. Cath in 2019 showed severe native CAD with patent grafts. Stable with no anginal symptoms. No indication for ischemic evaluation.  No beta-blocker in the setting of orthostatic hypotension.  Continue aspirin, Plavix, Imdur as below, Ranexa, and Crestor.  2. Mild mitral valve regurgitation: Mild on most recent echo in 10/2021.  Consider repeat echo in 3 to 5 years, sooner if clinically indicated.  3. Hypertension/orthostatic hypotension: He continues to struggle with labile BP, though recently he has had more consistently low BP.  He did have a recent unwitnessed fall though he denies any recent presyncope, syncope.  Repeat was recently discontinued, doxazosin was decreased to 4 mg daily (he has a h/o significant BPH).  Will decrease Imdur to 30 mg daily.  Recommended adequate hydration, gradual position height changes,  abdominal binder or thigh sleeves.  4. Near syncope: Likely vasovagal vs versus orthostasis. Denies any recent episodes.   5. Hyperlipidemia: LDL was 50 in 07/2021.  Continue Crestor.  6. PAD: S/p stenting of the left vertebral artery in 2002 with redo stent in 2005, stenting of left subclavian artery in 2005.  Carotid Dopplers in 10/2021 showed 1 to 39% B ICA stenosis and high resistant flow right vertebral artery but normal flow for the rest vertebral artery and bilateral subclavian arteries.  Stable from prior studies.  Continue ASA, Plavix, Crestor.   7. Type 2 diabetes: A1C was 7.8 in 09/2021. Blood sugar has been uncontrolled with recent readings in the 300s-400s. Insulin was recently increased. He is following closely with his PCP/endocrinology.   8. Disposition: Follow-up in 3 months.      Lenna Sciara, NP 02/12/2022, 2:42 PM

## 2022-02-11 NOTE — Patient Instructions (Signed)
Medication Instructions:  Imdur 30 mg daily  *If you need a refill on your cardiac medications before your next appointment, please call your pharmacy*   Lab Work: NONE ordered at this time of appointment   If you have labs (blood work) drawn today and your tests are completely normal, you will receive your results only by: [place] (if you have MyChart) OR A paper copy in the mail If you have any lab test that is abnormal or we need to change your treatment, we will call you to review the results.   Testing/Procedures: NONE ordered at this time of appointment     Follow-Up: At [facility], you and your health needs are our priority.  As part of our continuing mission to provide you with exceptional heart care, we have created designated Provider Care Teams.  These Care Teams include your primary Cardiologist (physician) and Advanced Practice Providers (APPs -  Physician Assistants and Nurse Practitioners) who all work together to provide you with the care you need, when you need it.  We recommend signing up for the patient portal called "MyChart".  Sign up information is provided on this After Visit Summary.  MyChart is used to connect with patients for Virtual Visits (Telemedicine).  Patients are able to view lab/test results, encounter notes, upcoming appointments, etc.  Non-urgent messages can be sent to your provider as well.   To learn more about what you can do with MyChart, go to [email or web address].    Your next appointment:   3 month(s)  Provider:   Diona Browner, NP        Other Instructions Continue to monitor Blood Pressure. Report systolic BP (top number) consistently less than 100. Reminder that you can use an abdominal binder or thigh sleeve.  Orthostatic Hypotension Blood pressure is a measurement of how strongly, or weakly, your circulating blood is pressing against the walls of your arteries. Orthostatic hypotension is a drop in blood pressure  that can happen when you change positions, such as when you go from lying down to standing. Arteries are blood vessels that carry blood from your heart throughout your body. When blood pressure is too low, you may not get enough blood to your brain or to the rest of your organs. Orthostatic hypotension can cause light-headedness, sweating, rapid heartbeat, blurred vision, and fainting. These symptoms require further investigation into the cause. What are the causes? Orthostatic hypotension can be caused by many things, including: Sudden changes in posture, such as standing up quickly after you have been sitting or lying down. Loss of blood (anemia) or loss of body fluids (dehydration). Heart problems, neurologic problems, or hormone problems. Pregnancy. Aging. The risk for this condition increases as you get older. Severe infection (sepsis). Certain medicines, such as medicines for high blood pressure or medicines that make the body lose excess fluids (diuretics). What are the signs or symptoms? Symptoms of this condition may include: Weakness, light-headedness, or dizziness. Sweating. Blurred vision. Tiredness (fatigue). Rapid heartbeat. Fainting, in severe cases. How is this diagnosed? This condition is diagnosed based on: Your symptoms and medical history. Your blood pressure measurements. Your health care provider will check your blood pressure when you are: Lying down. Sitting. Standing. A blood pressure reading is recorded as two numbers, such as "120 over 80" (or 120/80). The first ("top") number is called the systolic pressure. It is a measure of the pressure in your arteries as your heart beats. The second ("bottom") number is called the diastolic  pressure. It is a measure of the pressure in your arteries when your heart relaxes between beats. Blood pressure is measured in a unit called mmHg. Healthy blood pressure for most adults is 120/80 mmHg. Orthostatic hypotension is defined  as a 20 mmHg drop in systolic pressure or a 10 mmHg drop in diastolic pressure within 3 minutes of standing. Other information or tests that may be used to diagnose orthostatic hypotension include: Your other vital signs, such as your heart rate and temperature. Blood tests. An electrocardiogram (ECG) or echocardiogram. A Holter monitor. This is a device you wear that records your heart rhythm continuously, usually for 24-48 hours. Tilt table test. For this test, you will be safely secured to a table that moves you from a lying position to an upright position. Your heart rhythm and blood pressure will be monitored during the test. How is this treated? This condition may be treated by: Changing your diet. This may involve eating more salt (sodium) or drinking more water. Changing the dosage of certain medicines you are taking that might be lowering your blood pressure. Correcting the underlying reason for the orthostatic hypotension. Wearing compression stockings. Taking medicines to raise your blood pressure. Avoiding actions that trigger symptoms. Follow these instructions at home: Medicines Take over-the-counter and prescription medicines only as told by your health care provider. Follow instructions from your health care provider about changing the dosage of your current medicines, if this applies. Do not stop or adjust any of your medicines on your own. Eating and drinking  Drink enough fluid to keep your urine pale yellow. Eat extra salt only as directed. Do not add extra salt to your diet unless advised by your health care provider. Eat frequent, small meals. Avoid standing up suddenly after eating. General instructions  Get up slowly from lying down or sitting positions. This gives your blood pressure a chance to adjust. Avoid hot showers and excessive heat as directed by your health care provider. Engage in regular physical activity as directed by your health care provider. If  you have compression stockings, wear them as told. Keep all follow-up visits. This is important. Contact a health care provider if: You have a fever for more than 2-3 days. You feel more thirsty than usual. You feel dizzy or weak. Get help right away if: You have chest pain. You have a fast or irregular heartbeat. You become sweaty or feel light-headed. You feel short of breath. You faint. You have any symptoms of a stroke. "BE FAST" is an easy way to remember the main warning signs of a stroke: B - Balance. Signs are dizziness, sudden trouble walking, or loss of balance. E - Eyes. Signs are trouble seeing or a sudden change in vision. F - Face. Signs are sudden weakness or numbness of the face, or the face or eyelid drooping on one side. A - Arms. Signs are weakness or numbness in an arm. This happens suddenly and usually on one side of the body. S - Speech. Signs are sudden trouble speaking, slurred speech, or trouble understanding what people say. T - Time. Time to call emergency services. Write down what time symptoms started. You have other signs of a stroke, such as: A sudden, severe headache with no known cause. Nausea or vomiting. Seizure. These symptoms may represent a serious problem that is an emergency. Do not wait to see if the symptoms will go away. Get medical help right away. Call your local emergency services (911 in the [place]  Do not drive yourself to the hospital. Summary Orthostatic hypotension is a sudden drop in blood pressure. It can cause light-headedness, sweating, rapid heartbeat, blurred vision, and fainting. Orthostatic hypotension can be diagnosed by having your blood pressure taken while lying down, sitting, and then standing. Treatment may involve changing your diet, wearing compression stockings, sitting up slowly, adjusting your medicines, or correcting the underlying reason for the orthostatic hypotension. Get help right away if you have chest pain, a  fast or irregular heartbeat, or symptoms of a stroke. This information is not intended to replace advice given to you by your health care provider. Make sure you discuss any questions you have with your health care provider. Document Revised: 03/05/2020 Document Reviewed: 03/05/2020 Elsevier Patient Education  [place].

## 2022-02-12 ENCOUNTER — Encounter: Payer: Self-pay | Admitting: Nurse Practitioner

## 2022-02-15 DIAGNOSIS — Z6824 Body mass index (BMI) 24.0-24.9, adult: Secondary | ICD-10-CM | POA: Diagnosis not present

## 2022-02-15 DIAGNOSIS — S0990XA Unspecified injury of head, initial encounter: Secondary | ICD-10-CM | POA: Diagnosis not present

## 2022-02-15 DIAGNOSIS — N390 Urinary tract infection, site not specified: Secondary | ICD-10-CM | POA: Diagnosis not present

## 2022-02-15 DIAGNOSIS — E1169 Type 2 diabetes mellitus with other specified complication: Secondary | ICD-10-CM | POA: Diagnosis not present

## 2022-02-15 DIAGNOSIS — I10 Essential (primary) hypertension: Secondary | ICD-10-CM | POA: Diagnosis not present

## 2022-02-15 DIAGNOSIS — E162 Hypoglycemia, unspecified: Secondary | ICD-10-CM | POA: Diagnosis not present

## 2022-02-17 ENCOUNTER — Ambulatory Visit (INDEPENDENT_AMBULATORY_CARE_PROVIDER_SITE_OTHER): Payer: Medicare Other | Admitting: Gastroenterology

## 2022-02-17 ENCOUNTER — Encounter: Payer: Self-pay | Admitting: Gastroenterology

## 2022-02-17 VITALS — BP 138/70 | HR 61 | Temp 97.4°F | Ht 67.0 in | Wt 120.6 lb

## 2022-02-17 DIAGNOSIS — R634 Abnormal weight loss: Secondary | ICD-10-CM | POA: Diagnosis not present

## 2022-02-17 NOTE — Progress Notes (Signed)
Gastroenterology Office Note     Primary Care Physician:  [facility]  Primary Gastroenterologist: Dr. Abbey Chatters    Chief Complaint   Chief Complaint  Patient presents with   Weight Loss    Here for weight check     History of Present Illness   [name] is an 89 y.o. male presenting today in follow-up with a history of GERD, dysphagia, weight loss, anemia, mildly drifting Hgb but normal iron studies, here for weight check follow-up.   He had wanted to hold off on colonoscopy at last visit. He wants to talk with his PCP.  EGD last year.   Weight loss: in 2020 was in the mid 140s, 2021 high 130s, 2022 mid 120s-130s. 130 11/30/21. In Dec 2023 was 128. Today is 120. [name]   BP meds have been adjusted due to hypotension. Appetite is good. Diet has changed significantly with diabetes. Urology appt upcoming. No abdominal pain. BMs have been more frequent. Little small pieces of stool.    EGD/dilation July 2023 and found to have non-severe candida esophagitis, mild Schatzki ring s/p dilation, normal stomach and duodenal bulb, retained food in duodenum. Colonoscopy over 10 years ago in [place] per patient.   BPE Dec 02, 2021: frank tracheal aspiration during pharyngeal phase of swallowing, non-specific esophageal dysmotility disorder, small type 1 hiatal hernia, mild distal esophageal fold thickening. He is not interested in pursuing Speech Eval. Dysphagia occasionally.   CT abd/pelvis without contrast Jan 2024 1. A specific cause for the patient's left flank pain is not  identified. No urinary tract calculi are currently identified.  2. Sigmoid colon diverticulosis.  3. Mild lumbar spondylosis and degenerative disc disease.  4. Aortic atherosclerosis. Prior CABG.   Past Medical History:  Diagnosis Date   Arthritis    Asymptomatic stenosis of left vertebral artery 2002, 2005   Status post PTA/stent with redo; normal antegrade flow on dopplers 09/2013   CAD  (coronary artery disease) 1995   1CABG x3; redo in CABG x 1 2001 Free Radial to LAD; All grafts patent by Cath 08/2014: The proximal to mid LAD has poor retrograde filling from the LIMA due to in-stent restenosis. 50% ostial disease of free radial artery to the LAD.   CAD (coronary artery disease) of artery bypass graft 2000   PCI x 2 - ostial & prox-mid LAD (BMS) for atretic LIMA-LAD   CAD in native artery 1995   CABG x 3 - LIMA-LAD, SVG-OM2, SVG-rPDA   Chest pain 09/2017   GERD (gastroesophageal reflux disease)    Glaucoma    Hyperlipidemia    Hypertension, essential    S/P CABG x 3 1995   S/P Redo CABG x 1 2001   L Radial-LAD after 2 failed attempts @ LIMA-LAD PTCA; LAD stents 100% occluded   Stenosis of left subclavian artery ([place]) 11/2003   Status post PTA/stent --> < 50 % stenosis by Dopplers 09/2013   Type 2 diabetes mellitus ([place]) 2002    Past Surgical History:  Procedure Laterality Date   BALLOON DILATION N/A 07/07/2021   Procedure: BALLOON DILATION;  Surgeon: Eloise Harman, DO;  Location[facility];  Service: Endoscopy;  Laterality: N/A;   CARDIAC CATHETERIZATION N/A 08/04/2014   Procedure: Left Heart Cath and Cors/Grafts Angiography;  Surgeon: Leonie Man, MD;  Location: [facility];  Service: Cardiovascular: o-mRCA 70%, m-dRCA ~70% - SVG-dRCA Patent.  o-pLAD 100% stent occluded, mLAD 95% ISR with poor retrograde filling  from freeRadiial graft-LAD (50% ostial graft dz).  o-p Cx 80%. Widely patent SVG-Cx-OM fills retrograde to 80% lesion.; Normal LV Fxn & EDP   CORONARY ANGIOPLASTY  April and May 2001   After Both LAD stents occluded - PTCA of anastomatic LIMA-LAD lesion -- Unsuccessful.   CORONARY ARTERY BYPASS GRAFT  1995   LIMA-LAD, SVG-OM2, SVG-rPDA   CORONARY ARTERY BYPASS GRAFT  06/1999   Dr. Lucianne Lei Trigt: Redo LAD grafting with free Left Radial-distal LAD   CORONARY STENT PLACEMENT  1995-2000   2 BMS stents to osital-proximal & proximal-mid LAD; because  of atretic LIMA-LDA   ESOPHAGEAL BRUSHING  07/07/2021   Procedure: ESOPHAGEAL BRUSHING;  Surgeon: Eloise Harman, DO;  Location[facility];  Service: Endoscopy;;   ESOPHAGOGASTRODUODENOSCOPY (EGD) WITH PROPOFOL N/A 07/07/2021   non-severe candida esophagitis, mild Schatzki ring s/p dilation, normal stomach and duodenal bulb, retained food in duodenum.   LEFT HEART CATH AND CORONARY ANGIOGRAPHY N/A 09/18/2017   Procedure: LEFT HEART CATH AND CORONARY ANGIOGRAPHY;  Surgeon: Martinique, Peter M, MD;  Location: [facility];  Service: Cardiovascular;  Laterality: N/A;   LEFT HEART CATHETERIZATION WITH CORONARY/GRAFT ANGIOGRAM N/A 08/22/2011   Procedure: LEFT HEART CATHETERIZATION WITH Beatrix Fetters;  Surgeon: Leonie Man, MD;  Location: [facility];  Service: Cardiovascular:  Known occluded LIMA-LAD and ostial LAD. Moderate to severe proximal circumflex and RCA disease. Widely patent freeLRAD-dLAD, as well as SVG-RPDA (backfilling RPL), SVG-OM 2 (backfilling OM1)   SHOULDER OPEN ROTATOR CUFF REPAIR  10/2002   Dr. Noemi Chapel   [facility] VERT OR THOR CAROTID STENT Left October 2002; November 2005   Left Vertebral stent placed in October 2002 (Dr. Patrecia Pour); redo PCI in 2005   [place] Left 11/2003   Dr. Gwenlyn Found   TRANSTHORACIC ECHOCARDIOGRAM  04/10/2020   [facility]): Normal LV size and function.  EF 60 to 65%.  GR 1 DD.  Mild AI.  Mild to moderate LA dilation.  GR 1 DD.[name]   TRANSTHORACIC ECHOCARDIOGRAM  02/2021   EF 70 to 75%.  Hyperdynamic.  No RWMA.  GR 1 DD.  Normal RV, RVP and low normal RAP.  Normal valves.    Current Outpatient Medications  Medication Sig Dispense Refill   acetaminophen (TYLENOL) 500 MG tablet Take 1,000 mg by mouth every 6 (six) hours as needed for mild pain or headache.     albuterol (VENTOLIN HFA) 108 (90 Base) MCG/ACT inhaler Inhale 2 puffs into the lungs every 6 (six) hours as needed for wheezing or shortness of breath.      aspirin EC 81 MG tablet Take 81 mg by mouth daily with breakfast.      Brinzolamide-Brimonidine 1-0.2 % SUSP Place 1 drop into the right eye 3 (three) times daily.     Cholecalciferol (VITAMIN D) 2000 units tablet Take 2,000 Units by mouth daily with breakfast.     clopidogrel (PLAVIX) 75 MG tablet Take 75 mg by mouth daily.     diclofenac Sodium (VOLTAREN) 1 % GEL Apply 2 g topically 4 (four) times daily.     doxazosin (CARDURA) 8 MG tablet Take 1 tablet (8 mg total) by mouth at bedtime. (Patient taking differently: Take 4 mg by mouth at bedtime.) 30 tablet 1   finasteride (PROSCAR) 5 MG tablet Take 5 mg by mouth daily.     gabapentin (NEURONTIN) 300 MG capsule Take 900 mg by mouth 3 (three) times daily.     HUMALOG KWIKPEN 100 UNIT/ML KwikPen  Inject 2 Units into the skin 2 (two) times daily.     isosorbide mononitrate (IMDUR) 30 MG 24 hr tablet Take 1 tablet (30 mg total) by mouth daily. 90 tablet 3   nitroGLYCERIN (NITROSTAT) 0.4 MG SL tablet Place 1 tablet (0.4 mg total) under the tongue every 5 (five) minutes as needed for up to 25 days for chest pain. And increase blood pressure greater than [id number] systolic 25 tablet 6   Omega-3 Fatty Acids (FISH OIL) 1000 MG CAPS Take 1,000 mg by mouth 2 (two) times daily with a meal.      pantoprazole (PROTONIX) 40 MG tablet Take 1 tablet (40 mg total) by mouth 2 (two) times daily before a meal. (Patient taking differently: Take 40 mg by mouth daily.) 60 tablet 3   prednisoLONE acetate (PRED FORTE) 1 % ophthalmic suspension Place 1 drop into the left eye daily.     ranolazine (RANEXA) 500 MG 12 hr tablet Take 1 tablet (500 mg total) by mouth 2 (two) times daily. 60 tablet 1   rosuvastatin (CRESTOR) 40 MG tablet TAKE 1 TABLET BY MOUTH DAILY 90 tablet 3   TRESIBA FLEXTOUCH 100 UNIT/ML FlexTouch Pen Inject 8 Units into the skin daily with breakfast.     vitamin B-12 (VITAMIN B12) 500 MCG tablet Take 1 tablet (500 mcg total) by mouth daily.     vitamin C (ASCORBIC  ACID) 500 MG tablet Take 500 mg by mouth 2 (two) times daily with a meal.      No current facility-administered medications for this visit.    Allergies as of 02/17/2022 - Review Complete 02/17/2022  Allergen Reaction Noted   Iohexol Other (See Comments) 11/03/2002   Contrast media [iodinated contrast media]  11/09/2012   Metformin and related Diarrhea 11/11/2012    Family History  Problem Relation Age of Onset   Diabetes Mother    Diabetes Sister    Diabetes Brother     Social History   Socioeconomic History   Marital status: Married    Spouse name: Not on file   Number of children: Not on file   Years of education: Not on file   Highest education level: Not on file  Occupational History   Not on file  Tobacco Use   Smoking status: Former    Packs/day: 3.00    Years: 30.00    Total pack years: 90.00    Types: Cigarettes   Smokeless tobacco: Never   Tobacco comments:    quit smoking about 50 years ago  Vaping Use   Vaping Use: Never used  Substance and Sexual Activity   Alcohol use: No   Drug use: No   Sexual activity: Not Currently  Other Topics Concern   Not on file  Social History Narrative   He is married with one daughter. He has 2 grandchildren.   He does not smoke. He quit smoking in roughly 1970 after smoking 3 packs per day.   He is routinely at give him. It does not necessarily do routine exercise.    Social Determinants of Health   Financial Resource Strain: Not on file  Food Insecurity: No Food Insecurity (10/21/2021)   Hunger Vital Sign    Worried About Running Out of Food in the Last Year: Never true    Ran Out of Food in the Last Year: Never true  Transportation Needs: No Transportation Needs (10/21/2021)   PRAPARE - Hydrologist (Medical): No  Lack of Transportation (Non-Medical): No  Physical Activity: Not on file  Stress: Not on file  Social Connections: Not on file  Intimate Partner Violence: Not At Risk  (10/21/2021)   Humiliation, Afraid, Rape, and Kick questionnaire    Fear of Current or Ex-Partner: No    Emotionally Abused: No    Physically Abused: No    Sexually Abused: No     Review of Systems   Gen: Denies any fever, chills, fatigue, weight loss, lack of appetite.  CV: Denies chest pain, heart palpitations, peripheral edema, syncope.  Resp: Denies shortness of breath at rest or with exertion. Denies wheezing or cough.  GI: Denies dysphagia or odynophagia. Denies jaundice, hematemesis, fecal incontinence. GU : Denies urinary burning, urinary frequency, urinary hesitancy MS: Denies joint pain, muscle weakness, cramps, or limitation of movement.  Derm: Denies rash, itching, dry skin Psych: Denies depression, anxiety, memory loss, and confusion Heme: Denies bruising, bleeding, and enlarged lymph nodes.   Physical Exam   BP 138/70 (BP Location: Left Arm, Patient Position: Sitting, Cuff Size: Normal)   Pulse 61   Temp (!) 97.4 F (36.3 C) (Oral)   Ht '5\' 7"'$  (1.702 m)   Wt 120 lb 9.6 oz (54.7 kg)   SpO2 92%   BMI 18.89 kg/m  General:   Alert and oriented. Pleasant and cooperative. Frail Head:  Normocephalic and atraumatic. Eyes:  Without icterus Abdomen:  +BS, soft, non-tender and non-distended. No HSM noted. No guarding or rebound. No masses appreciated.  Rectal:  Deferred  Msk:  Symmetrical without gross deformities. Normal posture. Extremities:  Without edema. Neurologic:  Alert and  oriented x4;  grossly normal neurologically. Skin:  Intact without significant lesions or rashes. Psych:  Alert and cooperative. Normal mood and affect.   Assessment   [name] is an 89 y.o. male presenting today in follow-up with a history of GERD, dysphagia, weight loss, anemia, mildly drifting Hgb but normal iron studies, here for weight check follow-up.    He has continued to lose weight. Endorses good appetite and denies abdominal pain. He has noted a change in his diet  significantly due to diabetes and feels this is contributing. EGD/dilation on file from July 2023. He is overdue for colonoscopy, and he is still wanting to hold off on this until talking to his PCP. CT imaging on file from jan 2024 without significant findings. I do not think a CTA is warranted at this time as he does not have any food aversion, denies postprandial abdominal pain, etc.     PLAN    Will see him back in 6 weeks Recommend colonoscopy when he is willing.    Annitta Needs, PhD, ANP-BC [facility]astroenter[facility]

## 2022-02-17 NOTE — Patient Instructions (Signed)
I would like to see you back in 6 weeks!  I recommend a colonoscopy if you are willing after talking to your primary care doctor.  I enjoyed seeing you again today! At our first visit, I mentioned how I value our relationship and want to provide genuine, compassionate, and quality care. You may receive a survey regarding your visit with me, and I welcome your feedback! Thanks so much for taking the time to complete this. I look forward to seeing you again.   Annitta Needs, PhD, ANP-BC [facility] Gastroenterology

## 2022-02-18 DIAGNOSIS — I129 Hypertensive chronic kidney disease with stage 1 through stage 4 chronic kidney disease, or unspecified chronic kidney disease: Secondary | ICD-10-CM | POA: Diagnosis not present

## 2022-02-18 DIAGNOSIS — E1169 Type 2 diabetes mellitus with other specified complication: Secondary | ICD-10-CM | POA: Diagnosis not present

## 2022-02-18 DIAGNOSIS — E1140 Type 2 diabetes mellitus with diabetic neuropathy, unspecified: Secondary | ICD-10-CM | POA: Diagnosis not present

## 2022-02-18 DIAGNOSIS — E1165 Type 2 diabetes mellitus with hyperglycemia: Secondary | ICD-10-CM | POA: Diagnosis not present

## 2022-02-18 DIAGNOSIS — E876 Hypokalemia: Secondary | ICD-10-CM | POA: Diagnosis not present

## 2022-02-18 DIAGNOSIS — I10 Essential (primary) hypertension: Secondary | ICD-10-CM | POA: Diagnosis not present

## 2022-02-24 DIAGNOSIS — R350 Frequency of micturition: Secondary | ICD-10-CM | POA: Diagnosis not present

## 2022-03-02 DIAGNOSIS — Z0001 Encounter for general adult medical examination with abnormal findings: Secondary | ICD-10-CM | POA: Diagnosis not present

## 2022-03-02 DIAGNOSIS — E1169 Type 2 diabetes mellitus with other specified complication: Secondary | ICD-10-CM | POA: Diagnosis not present

## 2022-03-02 DIAGNOSIS — I10 Essential (primary) hypertension: Secondary | ICD-10-CM | POA: Diagnosis not present

## 2022-03-02 DIAGNOSIS — E162 Hypoglycemia, unspecified: Secondary | ICD-10-CM | POA: Diagnosis not present

## 2022-03-02 DIAGNOSIS — Z6823 Body mass index (BMI) 23.0-23.9, adult: Secondary | ICD-10-CM | POA: Diagnosis not present

## 2022-03-02 DIAGNOSIS — M545 Low back pain, unspecified: Secondary | ICD-10-CM | POA: Diagnosis not present

## 2022-03-02 DIAGNOSIS — S0990XA Unspecified injury of head, initial encounter: Secondary | ICD-10-CM | POA: Diagnosis not present

## 2022-03-03 ENCOUNTER — Ambulatory Visit: Payer: Medicare Other | Admitting: Gastroenterology

## 2022-03-03 ENCOUNTER — Other Ambulatory Visit: Payer: Self-pay | Admitting: Cardiology

## 2022-03-03 DIAGNOSIS — J440 Chronic obstructive pulmonary disease with acute lower respiratory infection: Secondary | ICD-10-CM | POA: Diagnosis not present

## 2022-03-03 DIAGNOSIS — I482 Chronic atrial fibrillation, unspecified: Secondary | ICD-10-CM | POA: Diagnosis not present

## 2022-03-03 DIAGNOSIS — E1169 Type 2 diabetes mellitus with other specified complication: Secondary | ICD-10-CM | POA: Diagnosis not present

## 2022-03-03 DIAGNOSIS — K219 Gastro-esophageal reflux disease without esophagitis: Secondary | ICD-10-CM | POA: Diagnosis not present

## 2022-03-09 DIAGNOSIS — D649 Anemia, unspecified: Secondary | ICD-10-CM | POA: Diagnosis not present

## 2022-03-14 ENCOUNTER — Telehealth: Payer: Self-pay

## 2022-03-14 DIAGNOSIS — M6281 Muscle weakness (generalized): Secondary | ICD-10-CM | POA: Diagnosis not present

## 2022-03-14 DIAGNOSIS — R262 Difficulty in walking, not elsewhere classified: Secondary | ICD-10-CM | POA: Diagnosis not present

## 2022-03-14 DIAGNOSIS — M545 Low back pain, unspecified: Secondary | ICD-10-CM | POA: Diagnosis not present

## 2022-03-14 NOTE — Telephone Encounter (Signed)
noted 

## 2022-03-14 NOTE — Telephone Encounter (Signed)
Please advise. Thank you

## 2022-03-14 NOTE — Telephone Encounter (Signed)
Great. We can arrange colonoscopy with Dr. Abbey Chatters. ASA 3. Reason: weight loss, anemia,   He will need to hold plavix 5 days prior. No insulin the morning of procedure.

## 2022-03-14 NOTE — Telephone Encounter (Signed)
Pt's daughter Freda Munro @ [phone] phoned to advise that the pt is ready to schedule a colonoscopy. He has talked to his PCP and he does agree. Please call and schedule the pt.

## 2022-03-17 ENCOUNTER — Telehealth: Payer: Self-pay | Admitting: Cardiology

## 2022-03-17 ENCOUNTER — Telehealth: Payer: Self-pay | Admitting: Internal Medicine

## 2022-03-17 DIAGNOSIS — M545 Low back pain, unspecified: Secondary | ICD-10-CM | POA: Diagnosis not present

## 2022-03-17 DIAGNOSIS — R262 Difficulty in walking, not elsewhere classified: Secondary | ICD-10-CM | POA: Diagnosis not present

## 2022-03-17 DIAGNOSIS — M6281 Muscle weakness (generalized): Secondary | ICD-10-CM | POA: Diagnosis not present

## 2022-03-17 DIAGNOSIS — I10 Essential (primary) hypertension: Secondary | ICD-10-CM | POA: Diagnosis not present

## 2022-03-17 DIAGNOSIS — E1169 Type 2 diabetes mellitus with other specified complication: Secondary | ICD-10-CM | POA: Diagnosis not present

## 2022-03-17 DIAGNOSIS — S0990XA Unspecified injury of head, initial encounter: Secondary | ICD-10-CM | POA: Diagnosis not present

## 2022-03-17 DIAGNOSIS — Z6824 Body mass index (BMI) 24.0-24.9, adult: Secondary | ICD-10-CM | POA: Diagnosis not present

## 2022-03-17 DIAGNOSIS — E162 Hypoglycemia, unspecified: Secondary | ICD-10-CM | POA: Diagnosis not present

## 2022-03-17 NOTE — Telephone Encounter (Signed)
Patient's daughter Freda Munro is returning RN's call. Please advise.

## 2022-03-17 NOTE — Telephone Encounter (Signed)
Thus fine, he can stop it or he could take 1 dose a day.  The issue is that there is nothing on the medicine that provides the antianginal benefit that Repatha does.  Glenetta Hew, MD

## 2022-03-17 NOTE — Telephone Encounter (Signed)
Called and spoke with pt's daughter. Pt will take one tablet daily until her runs out than he will stop taking the medication all together. No further questions at this time.

## 2022-03-17 NOTE — Telephone Encounter (Signed)
Spoke with patient of Dr. Ellyn Hack. He reports the price of Ranexa has doubled. He would like to see about stopping this d/t cost. Routed to MD/RN

## 2022-03-17 NOTE — Telephone Encounter (Signed)
Left message to call back  

## 2022-03-17 NOTE — Telephone Encounter (Signed)
Per Anna's note from 02/17/22 -  PLAN      Will see him back in 6 weeks Recommend colonoscopy when he is willing.   I would assume this would need to go to schedulers

## 2022-03-17 NOTE — Telephone Encounter (Signed)
Pt c/o medication issue:  1. Name of Medication: ranolazine (RANEXA) 500 MG 12 hr tablet   2. How are you currently taking this medication (dosage and times per day)?   3. Are you having a reaction (difficulty breathing--STAT)?   4. What is your medication issue?  Patient is requesting call back to discuss this medication and discontinuation

## 2022-03-17 NOTE — Telephone Encounter (Signed)
Patient's daughter left a message that she wants to schedule his colonoscopy.  I don't see a recall, no letter been sent.

## 2022-03-18 NOTE — Telephone Encounter (Signed)
Spoke to pt's daughter Freda Munro (on [place]) and advised her waiting for provider to get clarification on pt's Plavix. She will be back on Monday and once I get that I will call her to schedule his procedure. Verbalized understanding.

## 2022-03-21 DIAGNOSIS — M6281 Muscle weakness (generalized): Secondary | ICD-10-CM | POA: Diagnosis not present

## 2022-03-21 DIAGNOSIS — R262 Difficulty in walking, not elsewhere classified: Secondary | ICD-10-CM | POA: Diagnosis not present

## 2022-03-21 DIAGNOSIS — M545 Low back pain, unspecified: Secondary | ICD-10-CM | POA: Diagnosis not present

## 2022-03-23 DIAGNOSIS — M545 Low back pain, unspecified: Secondary | ICD-10-CM | POA: Diagnosis not present

## 2022-03-23 DIAGNOSIS — R262 Difficulty in walking, not elsewhere classified: Secondary | ICD-10-CM | POA: Diagnosis not present

## 2022-03-23 DIAGNOSIS — M6281 Muscle weakness (generalized): Secondary | ICD-10-CM | POA: Diagnosis not present

## 2022-03-28 DIAGNOSIS — M6281 Muscle weakness (generalized): Secondary | ICD-10-CM | POA: Diagnosis not present

## 2022-03-28 DIAGNOSIS — R262 Difficulty in walking, not elsewhere classified: Secondary | ICD-10-CM | POA: Diagnosis not present

## 2022-03-28 DIAGNOSIS — M545 Low back pain, unspecified: Secondary | ICD-10-CM | POA: Diagnosis not present

## 2022-03-31 DIAGNOSIS — S0990XA Unspecified injury of head, initial encounter: Secondary | ICD-10-CM | POA: Diagnosis not present

## 2022-03-31 DIAGNOSIS — M6281 Muscle weakness (generalized): Secondary | ICD-10-CM | POA: Diagnosis not present

## 2022-03-31 DIAGNOSIS — M545 Low back pain, unspecified: Secondary | ICD-10-CM | POA: Diagnosis not present

## 2022-03-31 DIAGNOSIS — I10 Essential (primary) hypertension: Secondary | ICD-10-CM | POA: Diagnosis not present

## 2022-03-31 DIAGNOSIS — Z6824 Body mass index (BMI) 24.0-24.9, adult: Secondary | ICD-10-CM | POA: Diagnosis not present

## 2022-03-31 DIAGNOSIS — R262 Difficulty in walking, not elsewhere classified: Secondary | ICD-10-CM | POA: Diagnosis not present

## 2022-03-31 DIAGNOSIS — E1169 Type 2 diabetes mellitus with other specified complication: Secondary | ICD-10-CM | POA: Diagnosis not present

## 2022-03-31 DIAGNOSIS — E162 Hypoglycemia, unspecified: Secondary | ICD-10-CM | POA: Diagnosis not present

## 2022-04-05 DIAGNOSIS — R262 Difficulty in walking, not elsewhere classified: Secondary | ICD-10-CM | POA: Diagnosis not present

## 2022-04-05 DIAGNOSIS — M6281 Muscle weakness (generalized): Secondary | ICD-10-CM | POA: Diagnosis not present

## 2022-04-05 DIAGNOSIS — M545 Low back pain, unspecified: Secondary | ICD-10-CM | POA: Diagnosis not present

## 2022-04-12 NOTE — Telephone Encounter (Signed)
Called pt's daughter to schedule colonoscopy and he has an appointment on 04/14/22. Advised her that we could schedule it then. Verbalized understanding.

## 2022-04-13 DIAGNOSIS — E1169 Type 2 diabetes mellitus with other specified complication: Secondary | ICD-10-CM | POA: Diagnosis not present

## 2022-04-13 DIAGNOSIS — M545 Low back pain, unspecified: Secondary | ICD-10-CM | POA: Diagnosis not present

## 2022-04-13 DIAGNOSIS — E162 Hypoglycemia, unspecified: Secondary | ICD-10-CM | POA: Diagnosis not present

## 2022-04-13 DIAGNOSIS — S0990XA Unspecified injury of head, initial encounter: Secondary | ICD-10-CM | POA: Diagnosis not present

## 2022-04-13 DIAGNOSIS — I10 Essential (primary) hypertension: Secondary | ICD-10-CM | POA: Diagnosis not present

## 2022-04-14 ENCOUNTER — Ambulatory Visit (INDEPENDENT_AMBULATORY_CARE_PROVIDER_SITE_OTHER): Payer: Medicare Other | Admitting: Gastroenterology

## 2022-04-14 ENCOUNTER — Encounter: Payer: Self-pay | Admitting: Gastroenterology

## 2022-04-14 VITALS — BP 175/62 | HR 70 | Temp 98.2°F | Ht 67.0 in | Wt 128.4 lb

## 2022-04-14 DIAGNOSIS — R634 Abnormal weight loss: Secondary | ICD-10-CM | POA: Diagnosis not present

## 2022-04-14 DIAGNOSIS — K219 Gastro-esophageal reflux disease without esophagitis: Secondary | ICD-10-CM

## 2022-04-14 NOTE — Progress Notes (Signed)
Gastroenterology Office Note     Primary Care Physician:  [facility]  Primary Gastroenterologist:   Chief Complaint   Chief Complaint  Patient presents with   Follow-up    Patient here today for a follow up appointment. Patient has finally gained weight per daughter. They are wanting to schedule the colonoscopy while they are here. Patient also mentions he has had a lot of protein in urine and is supposed to see a nephrologist next month  her in [place].     History of Present Illness   [name] is an 89 y.o. male presenting today in follow-up with a history of GERD, dysphagia, weight loss, anemia, mildly drifting Hgb but normal iron studies, here for weight check follow-up.   Weight loss: in 2020 was in the mid 140s, 2021 high 130s, 2022 mid 120s-130s. 130 11/30/21. In Dec 2023 was 128. Feb 2024 was 120. Today improved to 128.    Notes good appetite. PCP following blood sugars. Nephrologist appt upcoming. No overt GI bleeding. Denies changes in bowel habits. No constipation or diarrhea. Pantoprazole once daily. He desires a colonoscopy to wrap up evaluation for weight loss.   EGD/dilation July 2023 and found to have non-severe candida esophagitis, mild Schatzki ring s/p dilation, normal stomach and duodenal bulb, retained food in duodenum. Colonoscopy over 10 years ago in [place] per patient.    BPE Dec 02, 2021: frank tracheal aspiration during pharyngeal phase of swallowing, non-specific esophageal dysmotility disorder, small type 1 hiatal hernia, mild distal esophageal fold thickening. He is not interested in pursuing Speech Eval. Dysphagia occasionally.    CT abd/pelvis without contrast Jan 2024 1. A specific cause for the patient's left flank pain is not  identified. No urinary tract calculi are currently identified.  2. Sigmoid colon diverticulosis.  3. Mild lumbar spondylosis and degenerative disc disease.  4. Aortic atherosclerosis. Prior  CABG.    Past Medical History:  Diagnosis Date   Arthritis    Asymptomatic stenosis of left vertebral artery 2002, 2005   Status post PTA/stent with redo; normal antegrade flow on dopplers 09/2013   CAD (coronary artery disease) 1995   1CABG x3; redo in CABG x 1 2001 Free Radial to LAD; All grafts patent by Cath 08/2014: The proximal to mid LAD has poor retrograde filling from the LIMA due to in-stent restenosis. 50% ostial disease of free radial artery to the LAD.   CAD (coronary artery disease) of artery bypass graft 2000   PCI x 2 - ostial & prox-mid LAD (BMS) for atretic LIMA-LAD   CAD in native artery 1995   CABG x 3 - LIMA-LAD, SVG-OM2, SVG-rPDA   Chest pain 09/2017   GERD (gastroesophageal reflux disease)    Glaucoma    Hyperlipidemia    Hypertension, essential    S/P CABG x 3 1995   S/P Redo CABG x 1 2001   L Radial-LAD after 2 failed attempts @ LIMA-LAD PTCA; LAD stents 100% occluded   Stenosis of left subclavian artery 11/2003   Status post PTA/stent --> < 50 % stenosis by Dopplers 09/2013   Type 2 diabetes mellitus 2002    Past Surgical History:  Procedure Laterality Date   BALLOON DILATION N/A 07/07/2021   Procedure: BALLOON DILATION;  Surgeon: Lanelle Bal, DO;  Location: [facility];  Service: Endoscopy;  Laterality: N/A;   CARDIAC CATHETERIZATION N/A 08/04/2014   Procedure: Left Heart Cath and Cors/Grafts Angiography;  Surgeon: Marykay Lex, MD;  Location: [facility] INVASIVE CV LAB;  Service: Cardiovascular: o-mRCA 70%, m-dRCA ~70% - SVG-dRCA Patent.  o-pLAD 100% stent occluded, mLAD 95% ISR with poor retrograde filling from freeRadiial graft-LAD (50% ostial graft dz).  o-p Cx 80%. Widely patent SVG-Cx-OM fills retrograde to 80% lesion.; Normal LV Fxn & EDP   CORONARY ANGIOPLASTY  April and May 2001   After Both LAD stents occluded - PTCA of anastomatic LIMA-LAD lesion -- Unsuccessful.   CORONARY ARTERY BYPASS GRAFT  1995   LIMA-LAD, SVG-OM2, SVG-rPDA   CORONARY  ARTERY BYPASS GRAFT  06/1999   Dr. Zenaida Niece Trigt: Redo LAD grafting with free Left Radial-distal LAD   CORONARY STENT PLACEMENT  1995-2000   2 BMS stents to osital-proximal & proximal-mid LAD; because of atretic LIMA-LDA   ESOPHAGEAL BRUSHING  07/07/2021   Procedure: ESOPHAGEAL BRUSHING;  Surgeon: Lanelle Bal, DO;  Location: [facility];  Service: Endoscopy;;   ESOPHAGOGASTRODUODENOSCOPY (EGD) WITH PROPOFOL N/A 07/07/2021   non-severe candida esophagitis, mild Schatzki ring s/p dilation, normal stomach and duodenal bulb, retained food in duodenum.   LEFT HEART CATH AND CORONARY ANGIOGRAPHY N/A 09/18/2017   Procedure: LEFT HEART CATH AND CORONARY ANGIOGRAPHY;  Surgeon: Swaziland, Peter M, MD;  Location: [facility] INVASIVE CV LAB;  Service: Cardiovascular;  Laterality: N/A;   LEFT HEART CATHETERIZATION WITH CORONARY/GRAFT ANGIOGRAM N/A 08/22/2011   Procedure: LEFT HEART CATHETERIZATION WITH Isabel Caprice;  Surgeon: Marykay Lex, MD;  Location: [facility] CATH LAB;  Service: Cardiovascular:  Known occluded LIMA-LAD and ostial LAD. Moderate to severe proximal circumflex and RCA disease. Widely patent freeLRAD-dLAD, as well as SVG-RPDA (backfilling RPL), SVG-OM 2 (backfilling OM1)   SHOULDER OPEN ROTATOR CUFF REPAIR  10/2002   Dr. Thurston Hole   [facility] VERT OR THOR CAROTID STENT Left October 2002; November 2005   Left Vertebral stent placed in October 2002 (Dr. Titus Dubin); redo PCI in 2005   SUBCLAVIAN ARTERY STENT Left 11/2003   Dr. Allyson Sabal   TRANSTHORACIC ECHOCARDIOGRAM  04/10/2020   [facility]): Normal LV size and function.  EF 60 to 65%.  GR 1 DD.  Mild AI.  Mild to moderate LA dilation.  GR 1 DD.[name]   TRANSTHORACIC ECHOCARDIOGRAM  02/2021   EF 70 to 75%.  Hyperdynamic.  No RWMA.  GR 1 DD.  Normal RV, RVP and low normal RAP.  Normal valves.    Current Outpatient Medications  Medication Sig Dispense Refill   acetaminophen (TYLENOL) 500 MG tablet Take 1,000 mg by mouth every 6 (six) hours as  needed for mild pain or headache.     albuterol (VENTOLIN HFA) 108 (90 Base) MCG/ACT inhaler Inhale 2 puffs into the lungs every 6 (six) hours as needed for wheezing or shortness of breath.     aspirin EC 81 MG tablet Take 81 mg by mouth daily with breakfast.      Brinzolamide-Brimonidine 1-0.2 % SUSP Place 1 drop into the right eye 3 (three) times daily.     Cholecalciferol (VITAMIN D) 2000 units tablet Take 2,000 Units by mouth daily with breakfast.     clopidogrel (PLAVIX) 75 MG tablet Take 75 mg by mouth daily.     dapagliflozin propanediol (FARXIGA) 5 MG TABS tablet Take 5 mg by mouth daily.     diclofenac Sodium (VOLTAREN) 1 % GEL Apply 2 g topically 4 (four) times daily.     doxazosin (CARDURA) 8 MG tablet Take 1 tablet (8 mg total) by mouth at bedtime. (Patient taking differently: Take 4 mg by mouth at  bedtime.) 30 tablet 1   finasteride (PROSCAR) 5 MG tablet Take 5 mg by mouth daily.     gabapentin (NEURONTIN) 300 MG capsule Take 900 mg by mouth 3 (three) times daily.     HUMALOG KWIKPEN 100 UNIT/ML KwikPen Inject into the skin daily at 6 (six) AM. 5 units prior to breakfast 6 units prior to lunch     isosorbide mononitrate (IMDUR) 30 MG 24 hr tablet Take 1 tablet (30 mg total) by mouth daily. 90 tablet 3   nitroGLYCERIN (NITROSTAT) 0.4 MG SL tablet Place 1 tablet (0.4 mg total) under the tongue every 5 (five) minutes as needed for up to 25 days for chest pain. And increase blood pressure greater than 170 systolic 25 tablet 6   Omega-3 Fatty Acids (FISH OIL) 1000 MG CAPS Take 1,000 mg by mouth 2 (two) times daily with a meal.      pantoprazole (PROTONIX) 40 MG tablet Take 1 tablet (40 mg total) by mouth 2 (two) times daily before a meal. (Patient taking differently: Take 40 mg by mouth daily.) 60 tablet 3   prednisoLONE acetate (PRED FORTE) 1 % ophthalmic suspension Place 1 drop into the left eye daily.     rosuvastatin (CRESTOR) 40 MG tablet TAKE 1 TABLET BY MOUTH DAILY 90 tablet 3    TRESIBA FLEXTOUCH 100 UNIT/ML FlexTouch Pen Inject 14 Units into the skin at bedtime.     vitamin C (ASCORBIC ACID) 500 MG tablet Take 500 mg by mouth 2 (two) times daily with a meal.      No current facility-administered medications for this visit.    Allergies as of 04/14/2022 - Review Complete 04/14/2022  Allergen Reaction Noted   Iohexol Other (See Comments) 11/03/2002   Contrast media [iodinated contrast media]  11/09/2012   Metformin and related Diarrhea 11/11/2012    Family History  Problem Relation Age of Onset   Diabetes Mother    Diabetes Sister    Diabetes Brother     Social History   Socioeconomic History   Marital status: Married    Spouse name: Not on file   Number of children: Not on file   Years of education: Not on file   Highest education level: Not on file  Occupational History   Not on file  Tobacco Use   Smoking status: Former    Packs/day: 3.00    Years: 30.00    Additional pack years: 0.00    Total pack years: 90.00    Types: Cigarettes   Smokeless tobacco: Never   Tobacco comments:    quit smoking about 50 years ago  Vaping Use   Vaping Use: Never used  Substance and Sexual Activity   Alcohol use: No   Drug use: No   Sexual activity: Not Currently  Other Topics Concern   Not on file  Social History Narrative   He is married with one daughter. He has 2 grandchildren.   He does not smoke. He quit smoking in roughly 1970 after smoking 3 packs per day.   He is routinely at give him. It does not necessarily do routine exercise.    Social Determinants of Health   Financial Resource Strain: Not on file  Food Insecurity: No Food Insecurity (10/21/2021)   Hunger Vital Sign    Worried About Running Out of Food in the Last Year: Never true    Ran Out of Food in the Last Year: Never true  Transportation Needs: No Transportation Needs (10/21/2021)  PRAPARE - Administrator, Civil Service (Medical): No    Lack of Transportation  (Non-Medical): No  Physical Activity: Not on file  Stress: Not on file  Social Connections: Not on file  Intimate Partner Violence: Not At Risk (10/21/2021)   Humiliation, Afraid, Rape, and Kick questionnaire    Fear of Current or Ex-Partner: No    Emotionally Abused: No    Physically Abused: No    Sexually Abused: No     Review of Systems   Gen: Denies any fever, chills, fatigue, weight loss, lack of appetite.  CV: Denies chest pain, heart palpitations, peripheral edema, syncope.  Resp: Denies shortness of breath at rest or with exertion. Denies wheezing or cough.  GI: Denies dysphagia or odynophagia. Denies jaundice, hematemesis, fecal incontinence. GU : Denies urinary burning, urinary frequency, urinary hesitancy MS: Denies joint pain, muscle weakness, cramps, or limitation of movement.  Derm: Denies rash, itching, dry skin Psych: Denies depression, anxiety, memory loss, and confusion Heme: Denies bruising, bleeding, and enlarged lymph nodes.   Physical Exam   BP (!) 175/62 (BP Location: Right Arm, Patient Position: Sitting, Cuff Size: Normal)   Pulse 70   Temp 98.2 F (36.8 C) (Temporal)   Ht 5\' 7"  (1.702 m)   Wt 128 lb 6.4 oz (58.2 kg)   BMI 20.11 kg/m  General:   Alert and oriented. Pleasant and cooperative. Well-nourished and well-developed.  Head:  Normocephalic and atraumatic. Eyes:  Without icterus Abdomen:  +BS, soft, non-tender and non-distended. No HSM noted. No guarding or rebound. No masses appreciated.  Rectal:  Deferred  Msk:  Symmetrical without gross deformities. Normal posture. Extremities:  Without edema. Neurologic:  Alert and  oriented x4;  grossly normal neurologically. Skin:  Intact without significant lesions or rashes. Psych:  Alert and cooperative. Normal mood and affect.   Assessment   [name] is an 89 y.o. male presenting today in follow-up with a history of GERD, dysphagia, weight loss, anemia, mildly drifting Hgb but normal  iron studies, here for weight check and to discuss colonoscopy.  Weight loss: dating back to 2020 was in the mid 140s and slowly lost weight over the past few years. He was low as 120 in February but now improved to 128. EGD is on file from July 2023. CT Jan 2024 without any concerning findings. He has no abdominal pain. Also noted to have mild normocytic anemia but normal iron studies. His primary care had requested a colonoscopy several months ago, but patient had wanted to wait. He is now willing to pursue this due to mild anemia and weight loss.    Will hold Plavix 5 days prior.   PLAN   Proceed with colonoscopy by Dr. Marletta Lor  in near future: the risks, benefits, and alternatives have been discussed with the patient in detail. The patient states understanding and desires to proceed.  ASA 3 Stop Plavix 5 days prior. Hold Farxiga 72 hours prior. No insulin morning of procedure. 1/2 dose of Tresiba evening before procedure.  Further recommendations to follow.    Gelene Mink, PhD, ANP-BC [facility] Gastroenterology

## 2022-04-14 NOTE — Patient Instructions (Signed)
We are arranging a colonoscopy in the near future with Dr. Marletta Lor.   Please stop Plavix 5 days before the procedure. Hold Farxiga 72 hours before the procedure. Do not take any insulin the morning of the procedure. Take only 1/2 dose of Tresiba the evening before the procedure.   Further recommendations to follow!  I enjoyed seeing you again today! At our first visit, I mentioned how I value our relationship and want to provide genuine, compassionate, and quality care. You may receive a survey regarding your visit with me, and I welcome your feedback! Thanks so much for taking the time to complete this. I look forward to seeing you again.   Gelene Mink, PhD, ANP-BC [facility] Gastroenterology

## 2022-04-18 ENCOUNTER — Telehealth (INDEPENDENT_AMBULATORY_CARE_PROVIDER_SITE_OTHER): Payer: Self-pay | Admitting: Internal Medicine

## 2022-04-18 MED ORDER — PEG 3350-KCL-NA BICARB-NACL 420 G PO SOLR: 4000.0000 mL | Freq: Once | ORAL | 0 refills | Status: AC

## 2022-04-18 NOTE — Telephone Encounter (Signed)
Pt daughter Silvio Pate contacted to schedule pt colonoscopy with Dr.Carver. Scheduled for 05/31/22. Instructions sent via mail. Prep sent to pharmacy. Will call with pre op appt.   Per [facility] Notification or Prior Authorization is not required for the requested services You are not required to submit a notification/prior authorization based on the information provided. If you have general questions about the prior authorization requirements, visit [email or web address] > Clinician Resources > Advance and Admission Notification Requirements. The number above acknowledges your notification. Please write this reference number down for future reference. If you would like to request an organization determination, please call us at [phone]. Decision ID #: [id number]

## 2022-05-09 DIAGNOSIS — E1122 Type 2 diabetes mellitus with diabetic chronic kidney disease: Secondary | ICD-10-CM | POA: Diagnosis not present

## 2022-05-09 DIAGNOSIS — R809 Proteinuria, unspecified: Secondary | ICD-10-CM | POA: Diagnosis not present

## 2022-05-09 DIAGNOSIS — N1831 Chronic kidney disease, stage 3a: Secondary | ICD-10-CM | POA: Diagnosis not present

## 2022-05-10 ENCOUNTER — Other Ambulatory Visit (HOSPITAL_COMMUNITY): Payer: Self-pay | Admitting: Nephrology

## 2022-05-10 DIAGNOSIS — N1831 Chronic kidney disease, stage 3a: Secondary | ICD-10-CM

## 2022-05-10 DIAGNOSIS — E1122 Type 2 diabetes mellitus with diabetic chronic kidney disease: Secondary | ICD-10-CM

## 2022-05-11 ENCOUNTER — Ambulatory Visit: Payer: Medicare Other | Admitting: "Endocrinology

## 2022-05-11 DIAGNOSIS — E162 Hypoglycemia, unspecified: Secondary | ICD-10-CM | POA: Diagnosis not present

## 2022-05-11 DIAGNOSIS — E1169 Type 2 diabetes mellitus with other specified complication: Secondary | ICD-10-CM | POA: Diagnosis not present

## 2022-05-11 DIAGNOSIS — M545 Low back pain, unspecified: Secondary | ICD-10-CM | POA: Diagnosis not present

## 2022-05-11 DIAGNOSIS — Z6824 Body mass index (BMI) 24.0-24.9, adult: Secondary | ICD-10-CM | POA: Diagnosis not present

## 2022-05-11 DIAGNOSIS — S0990XA Unspecified injury of head, initial encounter: Secondary | ICD-10-CM | POA: Diagnosis not present

## 2022-05-11 DIAGNOSIS — I10 Essential (primary) hypertension: Secondary | ICD-10-CM | POA: Diagnosis not present

## 2022-05-12 ENCOUNTER — Ambulatory Visit: Payer: Medicare Other | Admitting: Nurse Practitioner

## 2022-05-16 ENCOUNTER — Telehealth (INDEPENDENT_AMBULATORY_CARE_PROVIDER_SITE_OTHER): Payer: Self-pay | Admitting: Internal Medicine

## 2022-05-16 NOTE — Telephone Encounter (Signed)
Pt daughter contacted and made aware of pre op for 05/26/22 at 11 am over at [facility]. Daughter they can not do 11AM; it has to be after 2. Gave daughter number to pre op.

## 2022-05-18 ENCOUNTER — Encounter: Payer: Self-pay | Admitting: Student

## 2022-05-18 ENCOUNTER — Ambulatory Visit: Payer: Medicare Other | Attending: Nurse Practitioner | Admitting: Student

## 2022-05-18 VITALS — BP 138/52 | HR 62 | Ht 65.0 in | Wt 129.2 lb

## 2022-05-18 DIAGNOSIS — Z01810 Encounter for preprocedural cardiovascular examination: Secondary | ICD-10-CM

## 2022-05-18 DIAGNOSIS — I10 Essential (primary) hypertension: Secondary | ICD-10-CM

## 2022-05-18 DIAGNOSIS — I25118 Atherosclerotic heart disease of native coronary artery with other forms of angina pectoris: Secondary | ICD-10-CM

## 2022-05-18 DIAGNOSIS — I739 Peripheral vascular disease, unspecified: Secondary | ICD-10-CM

## 2022-05-18 DIAGNOSIS — E785 Hyperlipidemia, unspecified: Secondary | ICD-10-CM | POA: Diagnosis not present

## 2022-05-18 NOTE — Progress Notes (Signed)
Cardiology Clinic Note   Date: 05/18/2022 ID: GURANSH GREELEY, DOB 1932-12-26, MRN [id number]  Primary Cardiologist:  Bryan Lemma, MD  Patient Profile    [name] is a 89 y.o. male who presents to the clinic today for 33-month follow-up and preoperative risk assessment.  Past medical history significant for: CAD. CABG x 3 1995: LIMA to LAD, SG to OM2, SVG to RPDA.  LHC 04/13/1999: LAD 100% occluded at the left main with 2 stents noted proximal circumflex 60% proximal to mid RCA diffusely diseased.  Widely patent SVG to OM, SVG to PDA.  LIMA was better in August 2000 but now it supplies the LAD.  There is retrograde flow into the LAD all the way up to the proximal stent which is 100% occluded.  The mid stent has in-stent restenosis of 60%.  There is 90% narrowing in the distal anastomotic site. Balloon angioplasty performed.  LHC 05/28/1999: Severe native coronary obstructive disease, with total occlusion of the LAD at the left main, 70% proximal circumflex stenosis, and diffuse 70-90% RCA stenoses. Patent SVG to the RCA. Patent SVG to the obtuse marginal vessel. Left internal mammary artery to the LAD with 80% stenosis at the anastomosis, with haziness; and 70% stenosis proximal to the anastomosis site. Very difficult attempt, unsuccessful at opening the native proximal LAD. Successful PTCA of the anastomosis of the LIMA to the LAD, and also involving the distal LAD; with a percent in diameter stenosis being reduced. CABG x 1 redo June 2001.  LHC 08/28/2000: Severe native disease with total occlusion of the left anterior descending at the ostium of the left main, terminal left main disease, proximal high-grade stenosis in the circumflex system, and sequential 60% areas of narrowing in the proximal and mid right coronary artery. Saphenous vein graft to the posterior descending artery was widely patent.  Saphenous vein graft to the obtuse marginal was widely patent.  Free left radial graft to  the left anterior descending appeared to be patent, although there is some question about the ostium having some degree of spasm. Left subclavian stenosis of 40-50%. Vertebral ostial narrowing of 80%. A 100% occlusion of the left internal mammary artery, however, this internal mammary artery had withered and the left anterior descending had been re-grafted using the left free radial graft. Normal left ventricular systolic function. An 11 mm aortic stenosis. Questionable contrast reaction.  In the future, the patient will need contrast media prophylaxis treatment for allergy. LHC 11/23/2004: Loss of the internal mammary artery to the left anterior descending graft.  Widely patent saphenous vein graft to the obtuse marginal, widely patent saphenous vein graft to the right coronary artery, and  widely patent free radial to the left anterior descending. LHC 08/22/2011: Severe native LCA disease with an occluded LM-LAD stent. Moderate native RCA disease. Widely patent SVGs to RPDA and OM2 along with Free Radial Graft to distal LAD. No culprit lesion visible to explain angina; however given the extent of diffuse distal LAD disease, small vessel disease cannot be discounted. Echo 10/21/2021: EF 60 to 65%.  Normal RV function.  Mild LAE.  Mild MR.  Trivial AR. PAD. Peripheral angiogram, percutaneous transluminal angioplasty and stent 11/11/2003: Successful PTA and stenting of the left subclavian artery. Carotid artery disease. Carotid ultrasound 10/14/2020: 1 to 39% bilateral ICA.  Left vertebral artery demonstrates antegrade flow.  Right vertebral artery demonstrates high resistive flow.  Normal flow bilateral subclavian arteries. Hypertension. Hyperlipidemia. Lipid panel 07/21/2021: LDL 50, HDL 36, TG 125,  total 108. GERD. T2DM.  History of Present Illness    [name] is a longtime patient of cardiology.  He is followed by Dr. Herbie Baltimore for the above outlined history.  Patient was last seen in the  office by Bernadene Person, NP on 02/11/2022.  At that time he was stable from a cardiac standpoint.  He was having some issues with low BP and amlodipine was recently discontinued and doxazosin decreased.  No medication changes were made.  Today, patient is accompanied by his daughter. He is stable from a cardiac standpoint. He has chronic angina that is stable and unchanged for years. He is very active doing "all the outside stuff." He does have to take rest breaks but that is more related to his labile blood sugar than angina/breathing issues. Occasional lower extremity edema that is at its best in the morning and progresses throughout the day. BP is spot checked at home and is normally ~120/50-60. Daughter reports DBP will sometimes get as low as in the 40s. Patient denies lightheadedness, dizziness, presyncope with this. He has an upcoming colonoscopy and will need to hold Plavix for 5 days prior. I do not see an official request for cardiac clearance in the system. Daughter will contact the GI office and ask them to send it.      ROS: All other systems reviewed and are otherwise negative except as noted in History of Present Illness.  Studies Reviewed    ECG personally reviewed by me today: NSR, 60 bpm.  No significant changes from 10/22/2022.   Physical Exam    VS:  BP (!) 142/92   Pulse 62   Ht 5\' 5"  (1.651 m)   Wt 129 lb 3.2 oz (58.6 kg)   SpO2 96%   BMI 21.50 kg/m  , BMI Body mass index is 21.5 kg/m.  GEN: Well nourished, well developed, in no acute distress. Neck: No JVD or carotid bruits. Cardiac:  RRR. No murmurs. No rubs or gallops.   Respiratory:  Respirations regular and unlabored. Clear to auscultation without rales, wheezing or rhonchi. GI: Soft, nontender, nondistended. Extremities: Radials/DP/PT 2+ and equal bilaterally. No clubbing or cyanosis. No edema.  Skin: Warm and dry, no rash. Neuro: Strength intact.  Assessment & Plan    CAD.  Extensive intervention history  to include CABG x 3 1995 and CABG x 1 redo in 2001 as well as several repeat heart catheterizations as outlined in patient profile.  Patient reports chronic angina that is stable and unchanged for years. He is very active doing yard work and other outdoor activities at home. Continue aspirin, Plavix, isosorbide, rosuvastatin, as needed SL NTG. PAD.  S/p PTA and stenting of left subclavian artery November 2005.  Carotid ultrasound October 2022 showed normal flow bilateral subclavian arteries.  Continue aspirin, Plavix, rosuvastatin. Hypertension. BP today 142/92 on intake and 138/52 on my recheck.  Patient spot checks BP at home with readings ~120/50-60.  Patient denies headaches, lightheadedness, dizziness, presyncope or syncope. Continue doxazosin, isosorbide. Hyperlipidemia.  LDL July 2023 50, at goal.  Continue rosuvastatin. Preoperative cardiovascular risk assessment. Colonoscopy Dr. Marletta Lor 05/31/2022. According to the RCRI, patient has a 6.6% risk of MACE. Patient reports activity equivalent to >4.0 METS (works outside doing all the yard work). Based on ACC/AHA guidelines, BOWE BONDURANT would be at acceptable risk for the planned procedure without further cardiovascular testing. Per Dr. Herbie Baltimore***, he may hold Plavix for 5 days prior to procedure and should resume as soon as hemodynamically  stable postoperatively.  Patient will continue aspirin through the perioperative period.  Disposition: Return in 6 months or sooner as needed.         Signed, Etta Grandchild. Shatana Saxton, DNP, NP-C

## 2022-05-18 NOTE — Patient Instructions (Signed)
Medication Instructions:  Your physician recommends that you continue on your current medications as directed. Please refer to the Current Medication list given to you today.  *If you need a refill on your cardiac medications before your next appointment, please call your pharmacy*   Lab Work: NONE If you have labs (blood work) drawn today and your tests are completely normal, you will receive your results only by: MyChart Message (if you have MyChart) OR A paper copy in the mail If you have any lab test that is abnormal or we need to change your treatment, we will call you to review the results.   Testing/Procedures: NONE   Follow-Up: At [facility], you and your health needs are our priority.  As part of our continuing mission to provide you with exceptional heart care, we have created designated Provider Care Teams.  These Care Teams include your primary Cardiologist (physician) and Advanced Practice Providers (APPs -  Physician Assistants and Nurse Practitioners) who all work together to provide you with the care you need, when you need it.  We recommend signing up for the patient portal called "MyChart".  Sign up information is provided on this After Visit Summary.  MyChart is used to connect with patients for Virtual Visits (Telemedicine).  Patients are able to view lab/test results, encounter notes, upcoming appointments, etc.  Non-urgent messages can be sent to your provider as well.   To learn more about what you can do with MyChart, go to [email or web address].    Your next appointment:   Please keep upcoming scheduled appointment with Dr. Herbie Baltimore.

## 2022-05-20 ENCOUNTER — Ambulatory Visit (HOSPITAL_COMMUNITY)
Admission: RE | Admit: 2022-05-20 | Discharge: 2022-05-20 | Disposition: A | Discharge status: Home / Self Care | Payer: Medicare Other | Source: Ambulatory Visit | Attending: Nephrology | Admitting: Nephrology

## 2022-05-20 ENCOUNTER — Encounter: Payer: Self-pay | Admitting: Student

## 2022-05-20 DIAGNOSIS — N1831 Chronic kidney disease, stage 3a: Secondary | ICD-10-CM

## 2022-05-20 DIAGNOSIS — N189 Chronic kidney disease, unspecified: Secondary | ICD-10-CM | POA: Diagnosis not present

## 2022-05-20 DIAGNOSIS — E1122 Type 2 diabetes mellitus with diabetic chronic kidney disease: Secondary | ICD-10-CM | POA: Insufficient documentation

## 2022-05-20 DIAGNOSIS — N281 Cyst of kidney, acquired: Secondary | ICD-10-CM | POA: Diagnosis not present

## 2022-05-25 NOTE — Patient Instructions (Signed)
[name]  05/25/2022     @PREFPERIOPPHARMACY @   Your procedure is scheduled on  05/31/2022.   Report to [facility] at  0600  A.M.   Call this number if you have problems the morning of surgery:  [phone]  If you experience any cold or flu symptoms such as cough, fever, chills, shortness of breath, etc. between now and your scheduled surgery, please notify us at the above number.   Remember:  Follow the diet and prep instructions given to you by the office.      Your last dose of plavix should be on 05/25/2022 and your last dose of farxiga should be on 05/27/2022.  Take 7 units of your tresiba the night before your procedure.      DO NOT take any medications for diabetes the morninf of your procedure.      Use your inhaler before you come and bring your rescue inhaler with you.     Take these medicines the morning of surgery with A SIP OF WATER             cardura, finasteride, gabapentin, isosorbide, pantoprazole.     Do not wear jewelry, make-up or nail polish, including gel polish,  artificial nails, or any other type of covering on natural nails (fingers and  toes).  Do not wear lotions, powders, or perfumes, or deodorant.  Do not shave 48 hours prior to surgery.  Men may shave face and neck.  Do not bring valuables to the hospital.  [facility] is not responsible for any belongings or valuables.   Contacts, dentures or bridgework may not be worn into surgery.  Leave your suitcase in the car.  After surgery it may be brought to your room.   For patients admitted to the hospital, discharge time will be determined by your treatment team.   Patients discharged the day of surgery will not be allowed to drive home and must have someone with them for 24 hours.    Special instructions:   DO NOT smoke tobacco or vape for 24 hours before your procedure.  Please read over the following fact sheets that you were given. Anesthesia Post-op Instructions  and Care and Recovery After Surgery      Colonoscopy, Adult, Care After The following information offers guidance on how to care for yourself after your procedure. Your health care provider may also give you more specific instructions. If you have problems or questions, contact your health care provider. What can I expect after the procedure? After the procedure, it is common to have: A small amount of blood in your stool for 24 hours after the procedure. Some gas. Mild cramping or bloating of your abdomen. Follow these instructions at home: Eating and drinking  Drink enough fluid to keep your urine pale yellow. Follow instructions from your health care provider about eating or drinking restrictions. Resume your normal diet as told by your health care provider. Avoid heavy or fried foods that are hard to digest. Activity Rest as told by your health care provider. Avoid sitting for a long time without moving. Get up to take short walks every 1-2 hours. This is important to improve blood flow and breathing. Ask for help if you feel weak or unsteady. Return to your normal activities as told by your health care provider. Ask your health care provider what activities are safe for you. Managing cramping and bloating  Try walking around when you  have cramps or feel bloated. If directed, apply heat to your abdomen as told by your health care provider. Use the heat source that your health care provider recommends, such as a moist heat pack or a heating pad. Place a towel between your skin and the heat source. Leave the heat on for 20-30 minutes. Remove the heat if your skin turns bright red. This is especially important if you are unable to feel pain, heat, or cold. You have a greater risk of getting burned. General instructions If you were given a sedative during the procedure, it can affect you for several hours. Do not drive or operate machinery until your health care provider says that it is  safe. For the first 24 hours after the procedure: Do not sign important documents. Do not drink alcohol. Do your regular daily activities at a slower pace than normal. Eat soft foods that are easy to digest. Take over-the-counter and prescription medicines only as told by your health care provider. Keep all follow-up visits. This is important. Contact a health care provider if: You have blood in your stool 2-3 days after the procedure. Get help right away if: You have more than a small spotting of blood in your stool. You have large blood clots in your stool. You have swelling of your abdomen. You have nausea or vomiting. You have a fever. You have increasing pain in your abdomen that is not relieved with medicine. These symptoms may be an emergency. Get help right away. Call 911. Do not wait to see if the symptoms will go away. Do not drive yourself to the hospital. Summary After the procedure, it is common to have a small amount of blood in your stool. You may also have mild cramping and bloating of your abdomen. If you were given a sedative during the procedure, it can affect you for several hours. Do not drive or operate machinery until your health care provider says that it is safe. Get help right away if you have a lot of blood in your stool, nausea or vomiting, a fever, or increased pain in your abdomen. This information is not intended to replace advice given to you by your health care provider. Make sure you discuss any questions you have with your health care provider. Document Revised: 08/12/2020 Document Reviewed: 08/12/2020 Elsevier Patient Education  2023 [employer] Monitored Anesthesia Care, Care After The following information offers guidance on how to care for yourself after your procedure. Your health care provider may also give you more specific instructions. If you have problems or questions, contact your health care provider. What can I expect after the  procedure? After the procedure, it is common to have: Tiredness. Little or no memory about what happened during or after the procedure. Impaired judgment when it comes to making decisions. Nausea or vomiting. Some trouble with balance. Follow these instructions at home: For the time period you were told by your health care provider:  Rest. Do not participate in activities where you could fall or become injured. Do not drive or use machinery. Do not drink alcohol. Do not take sleeping pills or medicines that cause drowsiness. Do not make important decisions or sign legal documents. Do not take care of children on your own. Medicines Take over-the-counter and prescription medicines only as told by your health care provider. If you were prescribed antibiotics, take them as told by your health care provider. Do not stop using the antibiotic even if you start to feel better.  Eating and drinking Follow instructions from your health care provider about what you may eat and drink. Drink enough fluid to keep your urine pale yellow. If you vomit: Drink clear fluids slowly and in small amounts as you are able. Clear fluids include water, ice chips, low-calorie sports drinks, and fruit juice that has water added to it (diluted fruit juice). Eat light and bland foods in small amounts as you are able. These foods include bananas, applesauce, rice, lean meats, toast, and crackers. General instructions  Have a responsible adult stay with you for the time you are told. It is important to have someone help care for you until you are awake and alert. If you have sleep apnea, surgery and some medicines can increase your risk for breathing problems. Follow instructions from your health care provider about wearing your sleep device: When you are sleeping. This includes during daytime naps. While taking prescription pain medicines, sleeping medicines, or medicines that make you drowsy. Do not use any  products that contain nicotine or tobacco. These products include cigarettes, chewing tobacco, and vaping devices, such as e-cigarettes. If you need help quitting, ask your health care provider. Contact a health care provider if: You feel nauseous or vomit every time you eat or drink. You feel light-headed. You are still sleepy or having trouble with balance after 24 hours. You get a rash. You have a fever. You have redness or swelling around the IV site. Get help right away if: You have trouble breathing. You have new confusion after you get home. These symptoms may be an emergency. Get help right away. Call 911. Do not wait to see if the symptoms will go away. Do not drive yourself to the hospital. This information is not intended to replace advice given to you by your health care provider. Make sure you discuss any questions you have with your health care provider. Document Revised: 05/17/2021 Document Reviewed: 05/17/2021 Elsevier Patient Education  2023 [employer].

## 2022-05-26 ENCOUNTER — Encounter (HOSPITAL_COMMUNITY): Payer: Medicare Other

## 2022-05-26 ENCOUNTER — Encounter (HOSPITAL_COMMUNITY)
Admission: RE | Admit: 2022-05-26 | Discharge: 2022-05-26 | Disposition: A | Discharge status: Home / Self Care | Payer: Medicare Other | Source: Ambulatory Visit | Attending: Internal Medicine | Admitting: Internal Medicine

## 2022-05-26 VITALS — BP 138/52 | HR 62 | Temp 97.6°F | Resp 18 | Ht 65.0 in | Wt 129.2 lb

## 2022-05-26 DIAGNOSIS — E1169 Type 2 diabetes mellitus with other specified complication: Secondary | ICD-10-CM | POA: Insufficient documentation

## 2022-05-26 DIAGNOSIS — Z01812 Encounter for preprocedural laboratory examination: Secondary | ICD-10-CM | POA: Insufficient documentation

## 2022-05-26 LAB — BASIC METABOLIC PANEL
Anion gap: 7 (ref 5–15)
BUN: 21 mg/dL (ref 8–23)
CO2: 24 mmol/L (ref 22–32)
Calcium: 8.7 mg/dL — ABNORMAL LOW (ref 8.9–10.3)
Chloride: 104 mmol/L (ref 98–111)
Creatinine, Ser: 0.92 mg/dL (ref 0.61–1.24)
GFR, Estimated: >60 mL/min (ref >60)
Glucose, Bld: 236 mg/dL — ABNORMAL HIGH (ref 70–99)
Potassium: 4.1 mmol/L (ref 3.5–5.1)
Sodium: 135 mmol/L (ref 135–145)

## 2022-05-31 ENCOUNTER — Other Ambulatory Visit: Payer: Self-pay

## 2022-05-31 ENCOUNTER — Encounter (HOSPITAL_COMMUNITY)
Admission: RE | Disposition: A | Discharge status: Home / Self Care | Payer: Self-pay | Source: Home / Self Care | Attending: Internal Medicine

## 2022-05-31 ENCOUNTER — Encounter (HOSPITAL_COMMUNITY): Payer: Self-pay

## 2022-05-31 ENCOUNTER — Ambulatory Visit (HOSPITAL_COMMUNITY)
Admission: RE | Admit: 2022-05-31 | Discharge: 2022-05-31 | Disposition: A | Discharge status: Home / Self Care | Payer: Medicare Other | Attending: Internal Medicine | Admitting: Internal Medicine

## 2022-05-31 ENCOUNTER — Ambulatory Visit (HOSPITAL_COMMUNITY): Payer: Medicare Other | Admitting: Anesthesiology

## 2022-05-31 ENCOUNTER — Ambulatory Visit (HOSPITAL_BASED_OUTPATIENT_CLINIC_OR_DEPARTMENT_OTHER): Payer: Medicare Other | Admitting: Anesthesiology

## 2022-05-31 DIAGNOSIS — R634 Abnormal weight loss: Secondary | ICD-10-CM

## 2022-05-31 DIAGNOSIS — K5790 Diverticulosis of intestine, part unspecified, without perforation or abscess without bleeding: Secondary | ICD-10-CM | POA: Diagnosis not present

## 2022-05-31 DIAGNOSIS — Z833 Family history of diabetes mellitus: Secondary | ICD-10-CM | POA: Insufficient documentation

## 2022-05-31 DIAGNOSIS — E1151 Type 2 diabetes mellitus with diabetic peripheral angiopathy without gangrene: Secondary | ICD-10-CM | POA: Insufficient documentation

## 2022-05-31 DIAGNOSIS — I25119 Atherosclerotic heart disease of native coronary artery with unspecified angina pectoris: Secondary | ICD-10-CM | POA: Insufficient documentation

## 2022-05-31 DIAGNOSIS — E039 Hypothyroidism, unspecified: Secondary | ICD-10-CM | POA: Insufficient documentation

## 2022-05-31 DIAGNOSIS — I10 Essential (primary) hypertension: Secondary | ICD-10-CM | POA: Diagnosis not present

## 2022-05-31 DIAGNOSIS — Z794 Long term (current) use of insulin: Secondary | ICD-10-CM | POA: Insufficient documentation

## 2022-05-31 DIAGNOSIS — Z6821 Body mass index (BMI) 21.0-21.9, adult: Secondary | ICD-10-CM | POA: Insufficient documentation

## 2022-05-31 DIAGNOSIS — Z951 Presence of aortocoronary bypass graft: Secondary | ICD-10-CM | POA: Insufficient documentation

## 2022-05-31 DIAGNOSIS — K649 Unspecified hemorrhoids: Secondary | ICD-10-CM | POA: Diagnosis not present

## 2022-05-31 DIAGNOSIS — K648 Other hemorrhoids: Secondary | ICD-10-CM | POA: Diagnosis not present

## 2022-05-31 DIAGNOSIS — D649 Anemia, unspecified: Secondary | ICD-10-CM | POA: Diagnosis not present

## 2022-05-31 DIAGNOSIS — E119 Type 2 diabetes mellitus without complications: Secondary | ICD-10-CM | POA: Diagnosis not present

## 2022-05-31 DIAGNOSIS — K219 Gastro-esophageal reflux disease without esophagitis: Secondary | ICD-10-CM | POA: Diagnosis not present

## 2022-05-31 DIAGNOSIS — K5730 Diverticulosis of large intestine without perforation or abscess without bleeding: Secondary | ICD-10-CM | POA: Diagnosis not present

## 2022-05-31 DIAGNOSIS — Z87891 Personal history of nicotine dependence: Secondary | ICD-10-CM | POA: Insufficient documentation

## 2022-05-31 HISTORY — PX: COLONOSCOPY WITH PROPOFOL: SHX5780

## 2022-05-31 LAB — GLUCOSE, CAPILLARY: Glucose-Capillary: 117 mg/dL — ABNORMAL HIGH (ref 70–99)

## 2022-05-31 SURGERY — COLONOSCOPY WITH PROPOFOL
Anesthesia: General

## 2022-05-31 MED ORDER — PHENYLEPHRINE 80 MCG/ML (10ML) SYRINGE FOR IV PUSH (FOR BLOOD PRESSURE SUPPORT)
PREFILLED_SYRINGE | INTRAVENOUS | Status: DC | PRN
  Administered 2022-05-31 (×3): 160 ug via INTRAVENOUS

## 2022-05-31 MED ORDER — LIDOCAINE HCL (CARDIAC) PF 100 MG/5ML IV SOSY
PREFILLED_SYRINGE | INTRAVENOUS | Status: DC | PRN
  Administered 2022-05-31: 50 mg via INTRAVENOUS

## 2022-05-31 MED ORDER — LACTATED RINGERS IV SOLN: INTRAVENOUS | Status: DC | PRN

## 2022-05-31 MED ORDER — PHENYLEPHRINE 80 MCG/ML (10ML) SYRINGE FOR IV PUSH (FOR BLOOD PRESSURE SUPPORT)
PREFILLED_SYRINGE | INTRAVENOUS | Status: AC
  Filled 2022-05-31: qty 10

## 2022-05-31 MED ORDER — PROPOFOL 10 MG/ML IV BOLUS
INTRAVENOUS | Status: DC | PRN
  Administered 2022-05-31: 70 mg via INTRAVENOUS
  Administered 2022-05-31: 20 mg via INTRAVENOUS
  Administered 2022-05-31: 30 mg via INTRAVENOUS

## 2022-05-31 NOTE — H&P (Signed)
Primary Care Physician:  [facility] Primary Gastroenterologist:  Dr. Marletta Lor  Pre-Procedure History & Physical: HPI:  [name] is a 89 y.o. male is here for a colonoscopy to be performed for anemia and weight loss  Past Medical History:  Diagnosis Date   Arthritis    Asymptomatic stenosis of left vertebral artery 2002, 2005   Status post PTA/stent with redo; normal antegrade flow on dopplers 09/2013   CAD (coronary artery disease) 1995   1CABG x3; redo in CABG x 1 2001 Free Radial to LAD; All grafts patent by Cath 08/2014: The proximal to mid LAD has poor retrograde filling from the LIMA due to in-stent restenosis. 50% ostial disease of free radial artery to the LAD.   CAD (coronary artery disease) of artery bypass graft 2000   PCI x 2 - ostial & prox-mid LAD (BMS) for atretic LIMA-LAD   CAD in native artery 1995   CABG x 3 - LIMA-LAD, SVG-OM2, SVG-rPDA   Chest pain 09/2017   GERD (gastroesophageal reflux disease)    Glaucoma    Hyperlipidemia    Hypertension, essential    S/P CABG x 3 1995   S/P Redo CABG x 1 2001   L Radial-LAD after 2 failed attempts @ LIMA-LAD PTCA; LAD stents 100% occluded   Stenosis of left subclavian artery (HCC) 11/2003   Status post PTA/stent --> < 50 % stenosis by Dopplers 09/2013   Type 2 diabetes mellitus (HCC) 2002    Past Surgical History:  Procedure Laterality Date   BALLOON DILATION N/A 07/07/2021   Procedure: BALLOON DILATION;  Surgeon: Lanelle Bal, DO;  Location: [facility];  Service: Endoscopy;  Laterality: N/A;   CARDIAC CATHETERIZATION N/A 08/04/2014   Procedure: Left Heart Cath and Cors/Grafts Angiography;  Surgeon: Marykay Lex, MD;  Location: [facility] INVASIVE CV LAB;  Service: Cardiovascular: o-mRCA 70%, m-dRCA ~70% - SVG-dRCA Patent.  o-pLAD 100% stent occluded, mLAD 95% ISR with poor retrograde filling from freeRadiial graft-LAD (50% ostial graft dz).  o-p Cx 80%. Widely patent SVG-Cx-OM fills retrograde to 80%  lesion.; Normal LV Fxn & EDP   CORONARY ANGIOPLASTY  April and May 2001   After Both LAD stents occluded - PTCA of anastomatic LIMA-LAD lesion -- Unsuccessful.   CORONARY ARTERY BYPASS GRAFT  1995   LIMA-LAD, SVG-OM2, SVG-rPDA   CORONARY ARTERY BYPASS GRAFT  06/1999   Dr. Zenaida Niece Trigt: Redo LAD grafting with free Left Radial-distal LAD   CORONARY STENT PLACEMENT  1995-2000   2 BMS stents to osital-proximal & proximal-mid LAD; because of atretic LIMA-LDA   ESOPHAGEAL BRUSHING  07/07/2021   Procedure: ESOPHAGEAL BRUSHING;  Surgeon: Lanelle Bal, DO;  Location: [facility];  Service: Endoscopy;;   ESOPHAGOGASTRODUODENOSCOPY (EGD) WITH PROPOFOL N/A 07/07/2021   non-severe candida esophagitis, mild Schatzki ring s/p dilation, normal stomach and duodenal bulb, retained food in duodenum.   LEFT HEART CATH AND CORONARY ANGIOGRAPHY N/A 09/18/2017   Procedure: LEFT HEART CATH AND CORONARY ANGIOGRAPHY;  Surgeon: [place], Peter M, MD;  Location: [facility] INVASIVE CV LAB;  Service: Cardiovascular;  Laterality: N/A;   LEFT HEART CATHETERIZATION WITH CORONARY/GRAFT ANGIOGRAM N/A 08/22/2011   Procedure: LEFT HEART CATHETERIZATION WITH Isabel Caprice;  Surgeon: Marykay Lex, MD;  Location: [facility] CATH LAB;  Service: Cardiovascular:  Known occluded LIMA-LAD and ostial LAD. Moderate to severe proximal circumflex and RCA disease. Widely patent freeLRAD-dLAD, as well as SVG-RPDA (backfilling RPL), SVG-OM 2 (backfilling OM1)   SHOULDER OPEN ROTATOR CUFF  REPAIR  10/2002   Dr. Thurston Hole   [facility] [place] VERT OR THOR CAROTID STENT Left October 2002; November 2005   Left Vertebral stent placed in October 2002 (Dr. Titus Dubin); redo PCI in 2005   SUBCLAVIAN ARTERY STENT Left 11/2003   Dr. Allyson Sabal   TRANSTHORACIC ECHOCARDIOGRAM  04/10/2020   [facility]): Normal LV size and function.  EF 60 to 65%.  GR 1 DD.  Mild AI.  Mild to moderate LA dilation.  GR 1 DD.[name]   TRANSTHORACIC ECHOCARDIOGRAM  02/2021   EF 70 to 75%.   Hyperdynamic.  No RWMA.  GR 1 DD.  Normal RV, RVP and low normal RAP.  Normal valves.    Prior to Admission medications   Medication Sig Start Date End Date Taking? Authorizing Provider  albuterol (VENTOLIN HFA) 108 (90 Base) MCG/ACT inhaler Inhale 2 puffs into the lungs every 6 (six) hours as needed for wheezing or shortness of breath. 03/04/20  Yes [provider]  Brinzolamide-Brimonidine 1-0.2 % SUSP Place 1 drop into the right eye 3 (three) times daily.   Yes [provider]  clopidogrel (PLAVIX) 75 MG tablet Take 75 mg by mouth daily.   Yes [provider]  dapagliflozin propanediol (FARXIGA) 5 MG TABS tablet Take 5 mg by mouth daily.   Yes [provider]  diclofenac Sodium (VOLTAREN) 1 % GEL Apply 2 g topically daily as needed (knee pain).   Yes [provider]  doxazosin (CARDURA) 8 MG tablet Take 1 tablet (8 mg total) by mouth at bedtime. 02/05/21  Yes Vassie Loll, MD  finasteride (PROSCAR) 5 MG tablet Take 5 mg by mouth daily. 01/15/21  Yes [provider]  gabapentin (NEURONTIN) 300 MG capsule Take 900 mg by mouth 3 (three) times daily.   Yes [provider]  HUMALOG KWIKPEN 100 UNIT/ML KwikPen Inject 3-6 Units into the skin See admin instructions. Inject 3 units subcutaneously prior to breakfast, 6 units prior to lunch and 3 units prior to dinner 02/07/22  Yes [provider]  isosorbide mononitrate (IMDUR) 30 MG 24 hr tablet Take 1 tablet (30 mg total) by mouth daily. 02/11/22  Yes Monge, Petra Kuba, NP  nitroGLYCERIN (NITROSTAT) 0.4 MG SL tablet Place 1 tablet (0.4 mg total) under the tongue every 5 (five) minutes as needed for up to 25 days for chest pain. And increase blood pressure greater than 170 systolic 04/30/20 1/61/09 Yes Marykay Lex, MD  Omega-3 Fatty Acids (FISH OIL) 1000 MG CAPS Take 1,000 mg by mouth 2 (two) times daily with a meal.    Yes [provider]  pantoprazole (PROTONIX) 40 MG tablet Take  1 tablet (40 mg total) by mouth 2 (two) times daily before a meal. 11/30/21  Yes Gelene Mink, NP  rosuvastatin (CRESTOR) 40 MG tablet TAKE 1 TABLET BY MOUTH DAILY 03/04/22  Yes Marykay Lex, MD  TRESIBA FLEXTOUCH 100 UNIT/ML FlexTouch Pen Inject 14 Units into the skin at bedtime. 02/16/21  Yes [provider]  vitamin C (ASCORBIC ACID) 500 MG tablet Take 500 mg by mouth 2 (two) times daily with a meal.    Yes [provider]  acetaminophen (TYLENOL) 500 MG tablet Take 1,000 mg by mouth every 6 (six) hours as needed for mild pain or headache.    [provider]  aspirin EC 81 MG tablet Take 81 mg by mouth daily with breakfast.     [provider]  Cholecalciferol (VITAMIN D) 2000 units tablet Take  2,000 Units by mouth daily with breakfast.    [provider]    Allergies as of 04/18/2022 - Review Complete 04/14/2022  Allergen Reaction Noted   Iohexol Other (See Comments) 11/03/2002   Contrast media [iodinated contrast media]  11/09/2012   Metformin and related Diarrhea 11/11/2012    Family History  Problem Relation Age of Onset   Diabetes Mother    Diabetes Sister    Diabetes Brother     Social History   Socioeconomic History   Marital status: Married    Spouse name: Not on file   Number of children: Not on file   Years of education: Not on file   Highest education level: Not on file  Occupational History   Not on file  Tobacco Use   Smoking status: Former    Packs/day: 3.00    Years: 30.00    Additional pack years: 0.00    Total pack years: 90.00    Types: Cigarettes   Smokeless tobacco: Never   Tobacco comments:    quit smoking about 50 years ago  Vaping Use   Vaping Use: Never used  Substance and Sexual Activity   Alcohol use: No   Drug use: No   Sexual activity: Not Currently  Other Topics Concern   Not on file  Social History Narrative   He is married with one daughter. He has 2 grandchildren.   He does not  smoke. He quit smoking in roughly 1970 after smoking 3 packs per day.   He is routinely at give him. It does not necessarily do routine exercise.    Social Determinants of Health   Financial Resource Strain: Not on file  Food Insecurity: No Food Insecurity (10/21/2021)   Hunger Vital Sign    Worried About Running Out of Food in the Last Year: Never true    Ran Out of Food in the Last Year: Never true  Transportation Needs: No Transportation Needs (10/21/2021)   PRAPARE - Administrator, Civil Service (Medical): No    Lack of Transportation (Non-Medical): No  Physical Activity: Not on file  Stress: Not on file  Social Connections: Not on file  Intimate Partner Violence: Not At Risk (10/21/2021)   Humiliation, Afraid, Rape, and Kick questionnaire    Fear of Current or Ex-Partner: No    Emotionally Abused: No    Physically Abused: No    Sexually Abused: No    Review of Systems: See HPI, otherwise negative ROS  Physical Exam: Vital signs in last 24 hours: Temp:  [97.8 F (36.6 C)] 97.8 F (36.6 C) (05/28 0719) Pulse Rate:  [73] 73 (05/28 0719) Resp:  [17] 17 (05/28 0719) BP: (122)/(99) 122/99 (05/28 0719) SpO2:  [99 %] 99 % (05/28 0719) Weight:  [58.6 kg] 58.6 kg (05/28 0719)   General:   Alert,  Well-developed, well-nourished, pleasant and cooperative in NAD Head:  Normocephalic and atraumatic. Eyes:  Sclera clear, no icterus.   Conjunctiva pink. Ears:  Normal auditory acuity. Nose:  No deformity, discharge,  or lesions. Msk:  Symmetrical without gross deformities. Normal posture. Extremities:  Without clubbing or edema. Neurologic:  Alert and  oriented x4;  grossly normal neurologically. Skin:  Intact without significant lesions or rashes. Psych:  Alert and cooperative. Normal mood and affect.  Impression/Plan: [name] is here for a colonoscopy to be performed for anemia and weight loss  The risks of the procedure including infection, bleed, or  perforation as well  as benefits, limitations, alternatives and imponderables have been reviewed with the patient. Questions have been answered. All parties agreeable.

## 2022-05-31 NOTE — Anesthesia Preprocedure Evaluation (Signed)
Anesthesia Evaluation  Patient identified by MRN, date of birth, ID band Patient awake    Reviewed: Allergy & Precautions, H&P , NPO status , Patient's Chart, lab work & pertinent test results, reviewed documented beta blocker date and time   Airway Mallampati: II  TM Distance: >3 FB Neck ROM: full    Dental no notable dental hx.    Pulmonary neg pulmonary ROS, former smoker   Pulmonary exam normal breath sounds clear to auscultation       Cardiovascular Exercise Tolerance: Good hypertension, + angina  + CAD, + CABG and + Peripheral Vascular Disease   Rhythm:regular Rate:Normal     Neuro/Psych negative neurological ROS  negative psych ROS   GI/Hepatic negative GI ROS, Neg liver ROS,GERD  ,,  Endo/Other  negative endocrine ROSdiabetesHypothyroidism    Renal/GU negative Renal ROS  negative genitourinary   Musculoskeletal   Abdominal   Peds  Hematology negative hematology ROS (+)   Anesthesia Other Findings   Reproductive/Obstetrics negative OB ROS                             Anesthesia Physical Anesthesia Plan  ASA: 3  Anesthesia Plan: General   Post-op Pain Management:    Induction:   PONV Risk Score and Plan: Propofol infusion  Airway Management Planned:   Additional Equipment:   Intra-op Plan:   Post-operative Plan:   Informed Consent: I have reviewed the patients History and Physical, chart, labs and discussed the procedure including the risks, benefits and alternatives for the proposed anesthesia with the patient or authorized representative who has indicated his/her understanding and acceptance.     Dental Advisory Given  Plan Discussed with: CRNA  Anesthesia Plan Comments:        Anesthesia Quick Evaluation

## 2022-05-31 NOTE — Transfer of Care (Signed)
Immediate Anesthesia Transfer of Care Note  Patient: [name]  Procedure(s) Performed: COLONOSCOPY WITH PROPOFOL  Patient Location: Short Stay  Anesthesia Type:General  Level of Consciousness: drowsy  Airway & Oxygen Therapy: Patient Spontanous Breathing and Patient connected to nasal cannula oxygen  Post-op Assessment: Report given to RN and Post -op Vital signs reviewed and stable  Post vital signs: Reviewed and stable  Last Vitals:  Vitals Value Taken Time  BP 100/32 05/31/22 0749  Temp 36.6 C 05/31/22 0749  Pulse 57 05/31/22 0749  Resp 13 05/31/22 0749  SpO2 99 % 05/31/22 0749    Last Pain:  Vitals:   05/31/22 0732  TempSrc:   PainSc: 0-No pain      Patients Stated Pain Goal: 5 (05/31/22 0719)  Complications: No notable events documented.

## 2022-05-31 NOTE — Op Note (Signed)
[facility] Patient Name: [name] Procedure Date: 05/31/2022 7:12 AM MRN: [id number] Date of Birth: 16-Sep-1932 Attending MD: Hennie Duos. Marletta Lor , [place], [id number] CSN: [id number] Age: 89 Admit Type: Outpatient Procedure:                Colonoscopy Indications:              Weight loss, Anemia Providers:                Hennie Duos. Marletta Lor, DO, Jannett Celestine, RN, Tammy                            Vaught, RN, Crystal Page Referring MD:              Medicines:                See the Anesthesia note for documentation of the                            administered medications Complications:            No immediate complications. Estimated Blood Loss:     Estimated blood loss: none. Procedure:                Pre-Anesthesia Assessment:                           - The anesthesia plan was to use monitored                            anesthesia care (MAC).                           After obtaining informed consent, the colonoscope                            was passed under direct vision. Throughout the                            procedure, the patient's blood pressure, pulse, and                            oxygen saturations were monitored continuously. The                            [id number]-HQ190L ([id number]) scope was introduced through                            the anus and advanced to the the cecum, identified                            by appendiceal orifice and ileocecal valve. The                            colonoscopy was performed without difficulty. The                            patient tolerated the procedure well. The  quality                            of the bowel preparation was evaluated using the                            BBPS [facility] Bowel Preparation Scale) with scores                            of: Right Colon = 3, Transverse Colon = 3 and Left                            Colon = 3 (entire mucosa seen well with no residual                            staining, small fragments  of stool or opaque                            liquid). The total BBPS score equals 9. Scope In: 7:36:16 AM Scope Out: 7:46:16 AM Scope Withdrawal Time: 0 hours 6 minutes 12 seconds  Total Procedure Duration: 0 hours 10 minutes 0 seconds  Findings:      Non-bleeding internal hemorrhoids were found during endoscopy.      Scattered medium-mouthed and small-mouthed diverticula were found in the       sigmoid colon, descending colon and transverse colon.      The exam was otherwise without abnormality. Impression:               - Non-bleeding internal hemorrhoids.                           - Diverticulosis in the sigmoid colon, in the                            descending colon and in the transverse colon.                           - The examination was otherwise normal.                           - No specimens collected. Moderate Sedation:      Per Anesthesia Care Recommendation:           - Patient has a contact number available for                            emergencies. The signs and symptoms of potential                            delayed complications were discussed with the                            patient. Return to normal activities tomorrow.                            Written discharge  instructions were provided to the                            patient.                           - Resume previous diet.                           - Continue present medications.                           - No repeat colonoscopy due to age.                           - Return to GI clinic in 3 months. Procedure Code(s):        --- Professional ---                           [phone], Colonoscopy, flexible; diagnostic, including                            collection of specimen(s) by brushing or washing,                            when performed (separate procedure) Diagnosis Code(s):        --- Professional ---                           K64.8, Other hemorrhoids                           R63.4,  Abnormal weight loss                           K57.30, Diverticulosis of large intestine without                            perforation or abscess without bleeding CPT copyright 2022 American Medical Association. All rights reserved. The codes documented in this report are preliminary and upon coder review may  be revised to meet current compliance requirements. Hennie Duos. Marletta Lor, DO Hennie Duos. Marletta Lor, DO 05/31/2022 7:49:55 AM This report has been signed electronically. Number of Addenda: 0

## 2022-05-31 NOTE — Anesthesia Procedure Notes (Signed)
Date/Time: 05/31/2022 7:37 AM  Performed by: Julian Reil, CRNAPre-anesthesia Checklist: Patient identified, Emergency Drugs available, Suction available and Patient being monitored Patient Re-evaluated:Patient Re-evaluated prior to induction Oxygen Delivery Method: Nasal cannula Induction Type: IV induction Placement Confirmation: positive ETCO2

## 2022-05-31 NOTE — Discharge Instructions (Addendum)
  Colonoscopy Discharge Instructions  Read the instructions outlined below and refer to this sheet in the next few weeks. These discharge instructions provide you with general information on caring for yourself after you leave the hospital. Your doctor may also give you specific instructions. While your treatment has been planned according to the most current medical practices available, unavoidable complications occasionally occur.   ACTIVITY You may resume your regular activity, but move at a slower pace for the next 24 hours.  Take frequent rest periods for the next 24 hours.  Walking will help get rid of the air and reduce the bloated feeling in your belly (abdomen).  No driving for 24 hours (because of the medicine (anesthesia) used during the test).   Do not sign any important legal documents or operate any machinery for 24 hours (because of the anesthesia used during the test).  NUTRITION Drink plenty of fluids.  You may resume your normal diet as instructed by your doctor.  Begin with a light meal and progress to your normal diet. Heavy or fried foods are harder to digest and may make you feel sick to your stomach (nauseated).  Avoid alcoholic beverages for 24 hours or as instructed.  MEDICATIONS You may resume your normal medications unless your doctor tells you otherwise.  WHAT YOU CAN EXPECT TODAY Some feelings of bloating in the abdomen.  Passage of more gas than usual.  Spotting of blood in your stool or on the toilet paper.  IF YOU HAD POLYPS REMOVED DURING THE COLONOSCOPY: No aspirin products for 7 days or as instructed.  No alcohol for 7 days or as instructed.  Eat a soft diet for the next 24 hours.  FINDING OUT THE RESULTS OF YOUR TEST Not all test results are available during your visit. If your test results are not back during the visit, make an appointment with your caregiver to find out the results. Do not assume everything is normal if you have not heard from your  caregiver or the medical facility. It is important for you to follow up on all of your test results.  SEEK IMMEDIATE MEDICAL ATTENTION IF: You have more than a spotting of blood in your stool.  Your belly is swollen (abdominal distention).  You are nauseated or vomiting.  You have a temperature over 101.  You have abdominal pain or discomfort that is severe or gets worse throughout the day.   Your colonoscopy was relatively unremarkable.  I did not find any polyps or evidence of colon cancer. Given your age, I do not think we need to perform further colonoscopy in the future.   You do have diverticulosis and internal hemorrhoids. I would recommend increasing fiber in your diet or adding OTC Benefiber/Metamucil. Be sure to drink at least 4 to 6 glasses of water daily. Follow-up with GI in 3 months   I hope you have a great rest of your week!  Hennie Duos. Marletta Lor, D.O. Gastroenterology and Hepatology [facility]

## 2022-06-03 DIAGNOSIS — E1169 Type 2 diabetes mellitus with other specified complication: Secondary | ICD-10-CM | POA: Diagnosis not present

## 2022-06-03 DIAGNOSIS — I482 Chronic atrial fibrillation, unspecified: Secondary | ICD-10-CM | POA: Diagnosis not present

## 2022-06-03 DIAGNOSIS — K219 Gastro-esophageal reflux disease without esophagitis: Secondary | ICD-10-CM | POA: Diagnosis not present

## 2022-06-03 DIAGNOSIS — J440 Chronic obstructive pulmonary disease with acute lower respiratory infection: Secondary | ICD-10-CM | POA: Diagnosis not present

## 2022-06-06 ENCOUNTER — Encounter (HOSPITAL_COMMUNITY): Payer: Self-pay | Admitting: Internal Medicine

## 2022-06-07 NOTE — Anesthesia Postprocedure Evaluation (Signed)
Anesthesia Post Note  Patient: [name]  Procedure(s) Performed: COLONOSCOPY WITH PROPOFOL  Patient location during evaluation: Phase II Anesthesia Type: General Level of consciousness: awake Pain management: pain level controlled Vital Signs Assessment: post-procedure vital signs reviewed and stable Respiratory status: spontaneous breathing and respiratory function stable Cardiovascular status: blood pressure returned to baseline and stable Postop Assessment: no headache and no apparent nausea or vomiting Anesthetic complications: no Comments: Late entry   No notable events documented.   Last Vitals:  Vitals:   05/31/22 0758 05/31/22 0801  BP: (!) 98/50 (!) 105/54  Pulse: 61   Resp:    Temp:    SpO2:      Last Pain:  Vitals:   05/31/22 0749  TempSrc:   PainSc: Asleep                 Windell Norfolk

## 2022-06-08 DIAGNOSIS — R809 Proteinuria, unspecified: Secondary | ICD-10-CM | POA: Diagnosis not present

## 2022-06-08 DIAGNOSIS — I10 Essential (primary) hypertension: Secondary | ICD-10-CM | POA: Diagnosis not present

## 2022-06-08 DIAGNOSIS — E1169 Type 2 diabetes mellitus with other specified complication: Secondary | ICD-10-CM | POA: Diagnosis not present

## 2022-06-08 DIAGNOSIS — E1122 Type 2 diabetes mellitus with diabetic chronic kidney disease: Secondary | ICD-10-CM | POA: Diagnosis not present

## 2022-06-08 DIAGNOSIS — N182 Chronic kidney disease, stage 2 (mild): Secondary | ICD-10-CM | POA: Diagnosis not present

## 2022-06-08 DIAGNOSIS — E162 Hypoglycemia, unspecified: Secondary | ICD-10-CM | POA: Diagnosis not present

## 2022-06-08 DIAGNOSIS — M545 Low back pain, unspecified: Secondary | ICD-10-CM | POA: Diagnosis not present

## 2022-06-08 DIAGNOSIS — S0990XA Unspecified injury of head, initial encounter: Secondary | ICD-10-CM | POA: Diagnosis not present

## 2022-06-08 DIAGNOSIS — D631 Anemia in chronic kidney disease: Secondary | ICD-10-CM | POA: Diagnosis not present

## 2022-06-10 ENCOUNTER — Other Ambulatory Visit: Payer: Self-pay | Admitting: Gastroenterology

## 2022-06-23 ENCOUNTER — Ambulatory Visit: Payer: Medicare Other | Admitting: Student

## 2022-07-03 DIAGNOSIS — J440 Chronic obstructive pulmonary disease with acute lower respiratory infection: Secondary | ICD-10-CM | POA: Diagnosis not present

## 2022-07-03 DIAGNOSIS — E1169 Type 2 diabetes mellitus with other specified complication: Secondary | ICD-10-CM | POA: Diagnosis not present

## 2022-07-03 DIAGNOSIS — K219 Gastro-esophageal reflux disease without esophagitis: Secondary | ICD-10-CM | POA: Diagnosis not present

## 2022-07-03 DIAGNOSIS — I482 Chronic atrial fibrillation, unspecified: Secondary | ICD-10-CM | POA: Diagnosis not present

## 2022-07-14 ENCOUNTER — Encounter: Payer: Self-pay | Admitting: Gastroenterology

## 2022-07-14 ENCOUNTER — Ambulatory Visit: Payer: Medicare Other | Admitting: Gastroenterology

## 2022-07-14 VITALS — BP 153/60 | HR 70 | Temp 97.8°F | Ht 66.0 in | Wt 129.0 lb

## 2022-07-14 DIAGNOSIS — K5900 Constipation, unspecified: Secondary | ICD-10-CM | POA: Diagnosis not present

## 2022-07-14 DIAGNOSIS — K219 Gastro-esophageal reflux disease without esophagitis: Secondary | ICD-10-CM | POA: Diagnosis not present

## 2022-07-14 NOTE — Progress Notes (Signed)
Gastroenterology Office Note     Primary Care Physician:  [facility]  Primary Gastroenterologist: Dr. Marletta Lor    Chief Complaint   Chief Complaint  Patient presents with   Follow-up    Doing well     History of Present Illness   [name] is an 89 y.o. male presenting today with history of GERD, dysphagia, weight loss, anemia, mildly drifting Hgb but normal iron studies,  returning in follow-up after colonoscopy to wrap up evaluation for weight loss. He has had an EGD/dilation in July 2023. CT on file Jan 2024.    Weight loss: in 2020 was in the mid 140s, 2021 high 130s, 2022 mid 120s-130s. 130 11/30/21. In Dec 2023 was 128. Feb 2024 was 120. April 2024 was 128. Today 129.    No dysphagia. Bristol stool scale #5. Takes fiber [employer]. Drinks lots of bottled water. Good appetite. No abdominal pain.   EGD/dilation July 2023 and found to have non-severe candida esophagitis, mild Schatzki ring s/p dilation, normal stomach and duodenal bulb, retained food in duodenum   Colonoscopy May 2024: diverticulosis.    Past Medical History:  Diagnosis Date   Arthritis    Asymptomatic stenosis of left vertebral artery 2002, 2005   Status post PTA/stent with redo; normal antegrade flow on dopplers 09/2013   CAD (coronary artery disease) 1995   1CABG x3; redo in CABG x 1 2001 Free Radial to LAD; All grafts patent by Cath 08/2014: The proximal to mid LAD has poor retrograde filling from the LIMA due to in-stent restenosis. 50% ostial disease of free radial artery to the LAD.   CAD (coronary artery disease) of artery bypass graft 2000   PCI x 2 - ostial & prox-mid LAD (BMS) for atretic LIMA-LAD   CAD in native artery 1995   CABG x 3 - LIMA-LAD, SVG-OM2, SVG-rPDA   Chest pain 09/2017   GERD (gastroesophageal reflux disease)    Glaucoma    Hyperlipidemia    Hypertension, essential    S/P CABG x 3 1995   S/P Redo CABG x 1 2001   L Radial-LAD after 2  failed attempts @ LIMA-LAD PTCA; LAD stents 100% occluded   Stenosis of left subclavian artery (HCC) 11/2003   Status post PTA/stent --> < 50 % stenosis by Dopplers 09/2013   Type 2 diabetes mellitus (HCC) 2002    Past Surgical History:  Procedure Laterality Date   BALLOON DILATION N/A 07/07/2021   Procedure: BALLOON DILATION;  Surgeon: Lanelle Bal, DO;  Location: [facility];  Service: Endoscopy;  Laterality: N/A;   CARDIAC CATHETERIZATION N/A 08/04/2014   Procedure: Left Heart Cath and Cors/Grafts Angiography;  Surgeon: Marykay Lex, MD;  Location: [facility] INVASIVE CV LAB;  Service: Cardiovascular: o-mRCA 70%, m-dRCA ~70% - SVG-dRCA Patent.  o-pLAD 100% stent occluded, mLAD 95% ISR with poor retrograde filling from freeRadiial graft-LAD (50% ostial graft dz).  o-p Cx 80%. Widely patent SVG-Cx-OM fills retrograde to 80% lesion.; Normal LV Fxn & EDP   COLONOSCOPY WITH PROPOFOL N/A 05/31/2022   Procedure: COLONOSCOPY WITH PROPOFOL;  Surgeon: Lanelle Bal, DO;  Location: [facility];  Service: Endoscopy;  Laterality: N/A;  7:30am; asa 3   CORONARY ANGIOPLASTY  April and May 2001   After Both LAD stents occluded - PTCA of anastomatic LIMA-LAD lesion -- Unsuccessful.   CORONARY ARTERY BYPASS GRAFT  1995   LIMA-LAD, SVG-OM2, SVG-rPDA   CORONARY ARTERY  BYPASS GRAFT  06/1999   Dr. Zenaida Niece Trigt: Redo LAD grafting with free Left Radial-distal LAD   CORONARY STENT PLACEMENT  1995-2000   2 BMS stents to osital-proximal & proximal-mid LAD; because of atretic LIMA-LDA   ESOPHAGEAL BRUSHING  07/07/2021   Procedure: ESOPHAGEAL BRUSHING;  Surgeon: Lanelle Bal, DO;  Location: [facility];  Service: Endoscopy;;   ESOPHAGOGASTRODUODENOSCOPY (EGD) WITH PROPOFOL N/A 07/07/2021   non-severe candida esophagitis, mild Schatzki ring s/p dilation, normal stomach and duodenal bulb, retained food in duodenum.   LEFT HEART CATH AND CORONARY ANGIOGRAPHY N/A 09/18/2017   Procedure: LEFT HEART CATH AND  CORONARY ANGIOGRAPHY;  Surgeon: Swazil[place], Peter M, MD;  Location: [facility] INVASIVE CV LAB;  Service: Cardiovascular;  Laterality: N/A;   LEFT HEART CATHETERIZATION WITH CORONARY/GRAFT ANGIOGRAM N/A 08/22/2011   Procedure: LEFT HEART CATHETERIZATION WITH Isabel Caprice;  Surgeon: Marykay Lex, MD;  Location: [facility] CATH LAB;  Service: Cardiovascular:  Known occluded LIMA-LAD and ostial LAD. Moderate to severe proximal circumflex and RCA disease. Widely patent freeLRAD-dLAD, as well as SVG-RPDA (backfilling RPL), SVG-OM 2 (backfilling OM1)   SHOULDER OPEN ROTATOR CUFF REPAIR  10/2002   Dr. Thurston Hole   SP [facility] VERT OR THOR CAROTID STENT Left October 2002; November 2005   Left Vertebral stent placed in October 2002 (Dr. Titus Dubin); redo PCI in 2005   SUBCLAVIAN ARTERY STENT Left 11/2003   Dr. Allyson Sabal   TRANSTHORACIC ECHOCARDIOGRAM  04/10/2020   [facility]): Normal LV size and function.  EF 60 to 65%.  GR 1 DD.  Mild AI.  Mild to moderate LA dilation.  GR 1 DD.[name]   TRANSTHORACIC ECHOCARDIOGRAM  02/2021   EF 70 to 75%.  Hyperdynamic.  No RWMA.  GR 1 DD.  Normal RV, RVP and low normal RAP.  Normal valves.    Current Outpatient Medications  Medication Sig Dispense Refill   acetaminophen (TYLENOL) 500 MG tablet Take 1,000 mg by mouth every 6 (six) hours as needed for mild pain or headache.     albuterol (VENTOLIN HFA) 108 (90 Base) MCG/ACT inhaler Inhale 2 puffs into the lungs every 6 (six) hours as needed for wheezing or shortness of breath.     aspirin EC 81 MG tablet Take 81 mg by mouth daily with breakfast.      Brinzolamide-Brimonidine 1-0.2 % SUSP Place 1 drop into the right eye 3 (three) times daily.     Cholecalciferol (VITAMIN D) 2000 units tablet Take 2,000 Units by mouth daily with breakfast.     clopidogrel (PLAVIX) 75 MG tablet Take 75 mg by mouth daily.     dapagliflozin propanediol (FARXIGA) 5 MG TABS tablet Take 5 mg by mouth daily.     diclofenac Sodium (VOLTAREN) 1 % GEL Apply 2  g topically daily as needed (knee pain).     doxazosin (CARDURA) 8 MG tablet Take 1 tablet (8 mg total) by mouth at bedtime. 30 tablet 1   ferrous sulfate (FEROSUL) 325 (65 FE) MG tablet Take 325 mg by mouth daily with breakfast.     finasteride (PROSCAR) 5 MG tablet Take 5 mg by mouth daily.     gabapentin (NEURONTIN) 300 MG capsule Take 900 mg by mouth 3 (three) times daily.     HUMALOG KWIKPEN 100 UNIT/ML KwikPen Inject 3-6 Units into the skin See admin instructions. Inject 3 units subcutaneously prior to breakfast, 6 units prior to lunch and 3 units prior to dinner     isosorbide mononitrate (IMDUR) 30 MG 24  hr tablet Take 1 tablet (30 mg total) by mouth daily. 90 tablet 3   nitroGLYCERIN (NITROSTAT) 0.4 MG SL tablet Place 1 tablet (0.4 mg total) under the tongue every 5 (five) minutes as needed for up to 25 days for chest pain. And increase blood pressure greater than 170 systolic 25 tablet 6   Omega-3 Fatty Acids (FISH OIL) 1000 MG CAPS Take 1,000 mg by mouth 2 (two) times daily with a meal.      pantoprazole (PROTONIX) 40 MG tablet TAKE 1 TABLET BY MOUTH TWICE DAILY BEFORE A MEAL 60 tablet 3   prednisoLONE acetate (PRED FORTE) 1 % ophthalmic suspension Place 1 drop into the left eye daily.     rosuvastatin (CRESTOR) 40 MG tablet TAKE 1 TABLET BY MOUTH DAILY 90 tablet 3   TRESIBA FLEXTOUCH 100 UNIT/ML FlexTouch Pen Inject 14 Units into the skin at bedtime.     vitamin C (ASCORBIC ACID) 500 MG tablet Take 500 mg by mouth 2 (two) times daily with a meal.      No current facility-administered medications for this visit.    Allergies as of 07/14/2022 - Review Complete 07/14/2022  Allergen Reaction Noted   Iohexol Other (See Comments) 11/03/2002   Contrast media [iodinated contrast media]  11/09/2012   Metformin and related Diarrhea 11/11/2012    Family History  Problem Relation Age of Onset   Diabetes Mother    Diabetes Sister    Diabetes Brother     Social History   Socioeconomic  History   Marital status: Married    Spouse name: Not on file   Number of children: Not on file   Years of education: Not on file   Highest education level: Not on file  Occupational History   Not on file  Tobacco Use   Smoking status: Former    Current packs/day: 3.00    Average packs/day: 3.0 packs/day for 30.0 years (90.0 ttl pk-yrs)    Types: Cigarettes   Smokeless tobacco: Never   Tobacco comments:    quit smoking about 50 years ago  Vaping Use   Vaping status: Never Used  Substance and Sexual Activity   Alcohol use: No   Drug use: No   Sexual activity: Not Currently  Other Topics Concern   Not on file  Social History Narrative   He is married with one daughter. He has 2 grandchildren.   He does not smoke. He quit smoking in roughly 1970 after smoking 3 packs per day.   He is routinely at give him. It does not necessarily do routine exercise.    Social Determinants of Health   Financial Resource Strain: Not on file  Food Insecurity: No Food Insecurity (10/21/2021)   Hunger Vital Sign    Worried About Running Out of Food in the Last Year: Never true    Ran Out of Food in the Last Year: Never true  Transportation Needs: No Transportation Needs (10/21/2021)   PRAPARE - Administrator, Civil Service (Medical): No    Lack of Transportation (Non-Medical): No  Physical Activity: Not on file  Stress: Not on file  Social Connections: Not on file  Intimate Partner Violence: Not At Risk (10/21/2021)   Humiliation, Afraid, Rape, and Kick questionnaire    Fear of Current or Ex-Partner: No    Emotionally Abused: No    Physically Abused: No    Sexually Abused: No     Review of Systems   Gen: Denies any  fever, chills, fatigue, weight loss, lack of appetite.  CV: Denies chest pain, heart palpitations, peripheral edema, syncope.  Resp: Denies shortness of breath at rest or with exertion. Denies wheezing or cough.  GI: Denies dysphagia or odynophagia. Denies  jaundice, hematemesis, fecal incontinence. GU : Denies urinary burning, urinary frequency, urinary hesitancy MS: Denies joint pain, muscle weakness, cramps, or limitation of movement.  Derm: Denies rash, itching, dry skin Psych: Denies depression, anxiety, memory loss, and confusion Heme: Denies bruising, bleeding, and enlarged lymph nodes.   Physical Exam   BP (!) 153/60 (BP Location: Right Arm, Patient Position: Sitting, Cuff Size: Normal)   Pulse 70   Temp 97.8 F (36.6 C) (Oral)   Ht 5\' 6"  (1.676 m)   Wt 129 lb (58.5 kg)   SpO2 94%   BMI 20.82 kg/m  General:   Alert and oriented. Pleasant and cooperative. Well-nourished and well-developed.  Head:  Normocephalic and atraumatic. Eyes:  Without icterus Abdomen:  +BS, soft, non-tender and non-distended. No HSM noted. No guarding or rebound. No masses appreciated.  Rectal:  Deferred  Msk:  with kyphosis  Extremities:  Without edema. Neurologic:  Alert and  oriented x4;  grossly normal neurologically. Skin:  Intact without significant lesions or rashes. Psych:  Alert and cooperative. Normal mood and affect.   Assessment   [name] is an 89 y.o. male presenting today with history of GERD, dysphagia, weight loss, anemia, mildly drifting Hgb but normal iron studies,  returning in follow-up after colonoscopy to wrap up evaluation for weight loss.   GERD: doing well on pantoprazole. No dysphagia. EGD on file from July 2023 as noted above.   Weight loss: he has had stable weight since Dec 2023. Colonoscopy unrevealing.   Mild constipation: noted occasional straining and Bristol stool scale #5. He is already on supplemental fiber daily. He will increase this and call if no improvement.   PLAN    Continue PPI Increase fiber as tolerated Call if no improvement. Can take Miralax prn Call if any further weight loss or any concerning signs/symptoms Return in 6 months. If doing well at that point, may be able to see  yearly   Gelene Mink, PhD, ANP-BC [facility]

## 2022-07-14 NOTE — Patient Instructions (Signed)
I recommend increasing your fiber to twice a day. Please call if no improvement!  If you notice weight loss or abdominal pain, please call.   We will see you in 6 months!  I enjoyed seeing you again today! I value our relationship and want to provide genuine, compassionate, and quality care. You may receive a survey regarding your visit with me, and I welcome your feedback! Thanks so much for taking the time to complete this. I look forward to seeing you again.      Gelene Mink, PhD, ANP-BC [facility]

## 2022-07-26 ENCOUNTER — Emergency Department (HOSPITAL_COMMUNITY)
Admission: EM | Admit: 2022-07-26 | Discharge: 2022-07-26 | Disposition: A | Discharge status: Home / Self Care | Payer: Medicare Other | Attending: Emergency Medicine | Admitting: Emergency Medicine

## 2022-07-26 ENCOUNTER — Other Ambulatory Visit: Payer: Self-pay

## 2022-07-26 ENCOUNTER — Encounter (HOSPITAL_COMMUNITY): Payer: Self-pay | Admitting: Emergency Medicine

## 2022-07-26 ENCOUNTER — Emergency Department (HOSPITAL_COMMUNITY): Payer: Medicare Other

## 2022-07-26 DIAGNOSIS — Z7951 Long term (current) use of inhaled steroids: Secondary | ICD-10-CM | POA: Insufficient documentation

## 2022-07-26 DIAGNOSIS — M791 Myalgia, unspecified site: Secondary | ICD-10-CM | POA: Diagnosis not present

## 2022-07-26 DIAGNOSIS — R519 Headache, unspecified: Secondary | ICD-10-CM | POA: Insufficient documentation

## 2022-07-26 DIAGNOSIS — R0602 Shortness of breath: Secondary | ICD-10-CM | POA: Diagnosis not present

## 2022-07-26 DIAGNOSIS — Z79899 Other long term (current) drug therapy: Secondary | ICD-10-CM | POA: Diagnosis not present

## 2022-07-26 DIAGNOSIS — Z6824 Body mass index (BMI) 24.0-24.9, adult: Secondary | ICD-10-CM | POA: Diagnosis not present

## 2022-07-26 DIAGNOSIS — I2510 Atherosclerotic heart disease of native coronary artery without angina pectoris: Secondary | ICD-10-CM | POA: Insufficient documentation

## 2022-07-26 DIAGNOSIS — E1169 Type 2 diabetes mellitus with other specified complication: Secondary | ICD-10-CM | POA: Diagnosis not present

## 2022-07-26 DIAGNOSIS — I959 Hypotension, unspecified: Secondary | ICD-10-CM | POA: Diagnosis not present

## 2022-07-26 DIAGNOSIS — R531 Weakness: Secondary | ICD-10-CM | POA: Diagnosis not present

## 2022-07-26 DIAGNOSIS — G8929 Other chronic pain: Secondary | ICD-10-CM | POA: Diagnosis not present

## 2022-07-26 DIAGNOSIS — R079 Chest pain, unspecified: Secondary | ICD-10-CM | POA: Diagnosis not present

## 2022-07-26 DIAGNOSIS — E119 Type 2 diabetes mellitus without complications: Secondary | ICD-10-CM | POA: Diagnosis not present

## 2022-07-26 DIAGNOSIS — Z7982 Long term (current) use of aspirin: Secondary | ICD-10-CM | POA: Insufficient documentation

## 2022-07-26 DIAGNOSIS — I10 Essential (primary) hypertension: Secondary | ICD-10-CM | POA: Diagnosis not present

## 2022-07-26 DIAGNOSIS — M79606 Pain in leg, unspecified: Secondary | ICD-10-CM | POA: Diagnosis not present

## 2022-07-26 DIAGNOSIS — Z951 Presence of aortocoronary bypass graft: Secondary | ICD-10-CM | POA: Diagnosis not present

## 2022-07-26 DIAGNOSIS — Z7902 Long term (current) use of antithrombotics/antiplatelets: Secondary | ICD-10-CM | POA: Insufficient documentation

## 2022-07-26 LAB — CBC
HCT: 36.5 % — ABNORMAL LOW (ref 39.0–52.0)
Hemoglobin: 12.7 g/dL — ABNORMAL LOW (ref 13.0–17.0)
MCH: 30.4 pg (ref 26.0–34.0)
MCHC: 34.8 g/dL (ref 30.0–36.0)
MCV: 87.3 fL (ref 80.0–100.0)
Platelets: 154 10*3/uL (ref 150–400)
RBC: 4.18 MIL/uL — ABNORMAL LOW (ref 4.22–5.81)
RDW: 12.1 % (ref 11.5–15.5)
WBC: 6.1 10*3/uL (ref 4.0–10.5)
nRBC: 0 % (ref 0.0–0.2)

## 2022-07-26 LAB — PROTIME-INR
INR: 1.1 (ref 0.8–1.2)
Prothrombin Time: 14.3 seconds (ref 11.4–15.2)

## 2022-07-26 LAB — HEPATIC FUNCTION PANEL
ALT: 34 U/L (ref 0–44)
AST: 22 U/L (ref 15–41)
Albumin: 3.6 g/dL (ref 3.5–5.0)
Alkaline Phosphatase: 51 U/L (ref 38–126)
Bilirubin, Direct: <0.1 mg/dL (ref 0.0–0.2)
Total Bilirubin: 0.5 mg/dL (ref 0.3–1.2)
Total Protein: 6.6 g/dL (ref 6.5–8.1)

## 2022-07-26 LAB — BASIC METABOLIC PANEL
Anion gap: 7 (ref 5–15)
BUN: 20 mg/dL (ref 8–23)
CO2: 24 mmol/L (ref 22–32)
Calcium: 9.2 mg/dL (ref 8.9–10.3)
Chloride: 103 mmol/L (ref 98–111)
Creatinine, Ser: 0.97 mg/dL (ref 0.61–1.24)
GFR, Estimated: >60 mL/min (ref >60)
Glucose, Bld: 194 mg/dL — ABNORMAL HIGH (ref 70–99)
Potassium: 3.9 mmol/L (ref 3.5–5.1)
Sodium: 134 mmol/L — ABNORMAL LOW (ref 135–145)

## 2022-07-26 LAB — LACTIC ACID, PLASMA: Lactic Acid, Venous: 0.9 mmol/L (ref 0.5–1.9)

## 2022-07-26 LAB — TSH: TSH: 4.054 u[IU]/mL (ref 0.350–4.500)

## 2022-07-26 LAB — TROPONIN I (HIGH SENSITIVITY)
Troponin I (High Sensitivity): 6 ng/L (ref <18)
Troponin I (High Sensitivity): 6 ng/L (ref <18)

## 2022-07-26 MED ORDER — SODIUM CHLORIDE 0.9 % IV BOLUS
500.0000 mL | Freq: Once | INTRAVENOUS | Status: AC
  Administered 2022-07-26: 500 mL via INTRAVENOUS

## 2022-07-26 NOTE — ED Provider Notes (Signed)
[employer] EMERGENCY DEPARTMENT AT [facility] Provider Note   CSN: [id number] Arrival date & time: 07/26/22  1437     History  Chief Complaint  Patient presents with   Hypotension   arm and leg pain    [name] is a 89 y.o. male.  HPI Patient with hypotension.  Reported has been feeling bad for about a week.  States feels unsteady.  Reportedly headache and aching all over his arms and legs.  Saw PCP today and had blood pressure that was in the 80s systolic.  Upon arrival blood pressures improved.  Has had some chronic chest pain that is really unchanged.  Does have some chronic angina.  No fevers or chills.  No weight loss.  No recent changes in medications.   Past Medical History:  Diagnosis Date   Arthritis    Asymptomatic stenosis of left vertebral artery 2002, 2005   Status post PTA/stent with redo; normal antegrade flow on dopplers 09/2013   CAD (coronary artery disease) 1995   1CABG x3; redo in CABG x 1 2001 Free Radial to LAD; All grafts patent by Cath 08/2014: The proximal to mid LAD has poor retrograde filling from the LIMA due to in-stent restenosis. 50% ostial disease of free radial artery to the LAD.   CAD (coronary artery disease) of artery bypass graft 2000   PCI x 2 - ostial & prox-mid LAD (BMS) for atretic LIMA-LAD   CAD in native artery 1995   CABG x 3 - LIMA-LAD, SVG-OM2, SVG-rPDA   Chest pain 09/2017   GERD (gastroesophageal reflux disease)    Glaucoma    Hyperlipidemia    Hypertension, essential    S/P CABG x 3 1995   S/P Redo CABG x 1 2001   L Radial-LAD after 2 failed attempts @ LIMA-LAD PTCA; LAD stents 100% occluded   Stenosis of left subclavian artery (HCC) 11/2003   Status post PTA/stent --> < 50 % stenosis by Dopplers 09/2013   Type 2 diabetes mellitus (HCC) 2002    Home Medications Prior to Admission medications   Medication Sig Start Date End Date Taking? Authorizing Provider  acetaminophen (TYLENOL) 500 MG tablet Take 1,000  mg by mouth every 6 (six) hours as needed for mild pain or headache.    [provider]  albuterol (VENTOLIN HFA) 108 (90 Base) MCG/ACT inhaler Inhale 2 puffs into the lungs every 6 (six) hours as needed for wheezing or shortness of breath. 03/04/20   [provider]  aspirin EC 81 MG tablet Take 81 mg by mouth daily with breakfast.     [provider]  Brinzolamide-Brimonidine 1-0.2 % SUSP Place 1 drop into the right eye 3 (three) times daily.    [provider]  Cholecalciferol (VITAMIN D) 2000 units tablet Take 2,000 Units by mouth daily with breakfast.    [provider]  clopidogrel (PLAVIX) 75 MG tablet Take 75 mg by mouth daily.    [provider]  dapagliflozin propanediol (FARXIGA) 5 MG TABS tablet Take 5 mg by mouth daily.    [provider]  diclofenac Sodium (VOLTAREN) 1 % GEL Apply 2 g topically daily as needed (knee pain).    [provider]  doxazosin (CARDURA) 8 MG tablet Take 1 tablet (8 mg total) by mouth at bedtime. 02/05/21   Vassie Loll, MD  ferrous sulfate (FEROSUL) 325 (65 FE) MG tablet Take 325 mg by mouth daily with breakfast.    [provider]  finasteride (PROSCAR) 5 MG tablet Take 5 mg by mouth daily. 01/15/21   [provider]  gabapentin (NEURONTIN) 300 MG capsule Take 900 mg by mouth 3 (three) times daily.    [provider]  HUMALOG KWIKPEN 100 UNIT/ML KwikPen Inject 3-6 Units into the skin See admin instructions. Inject 3 units subcutaneously prior to breakfast, 6 units prior to lunch and 3 units prior to dinner 02/07/22   [provider]  isosorbide mononitrate (IMDUR) 30 MG 24 hr tablet Take 1 tablet (30 mg total) by mouth daily. 02/11/22   Joylene Grapes, NP  nitroGLYCERIN (NITROSTAT) 0.4 MG SL tablet Place 1 tablet (0.4 mg total) under the tongue every 5 (five) minutes as needed for up to 25 days for chest pain. And increase blood pressure greater than 170 systolic  04/30/20 07/14/22  Marykay Lex, MD  Omega-3 Fatty Acids (FISH OIL) 1000 MG CAPS Take 1,000 mg by mouth 2 (two) times daily with a meal.     [provider]  pantoprazole (PROTONIX) 40 MG tablet TAKE 1 TABLET BY MOUTH TWICE DAILY BEFORE A MEAL 06/20/22   Gelene Mink, NP  prednisoLONE acetate (PRED FORTE) 1 % ophthalmic suspension Place 1 drop into the left eye daily.    [provider]  rosuvastatin (CRESTOR) 40 MG tablet TAKE 1 TABLET BY MOUTH DAILY 03/04/22   Marykay Lex, MD  TRESIBA FLEXTOUCH 100 UNIT/ML FlexTouch Pen Inject 14 Units into the skin at bedtime. 02/16/21   [provider]  vitamin C (ASCORBIC ACID) 500 MG tablet Take 500 mg by mouth 2 (two) times daily with a meal.     [provider]      Allergies    Iohexol, Contrast media [iodinated contrast media], and Metformin and related    Review of Systems   Review of Systems  Physical Exam Updated Vital Signs BP 139/68 (BP Location: Right Arm)   Pulse 61   Temp 98.1 F (36.7 C) (Oral)   Resp 14   Ht 5\' 6"  (1.676 m)   Wt 59 kg   SpO2 96%   BMI 20.98 kg/m  Physical Exam Vitals reviewed.  HENT:     Head: Normocephalic.  Cardiovascular:     Rate and Rhythm: Regular rhythm.  Pulmonary:     Comments: Midline scar from previous sternotomy. Abdominal:     Tenderness: There is no abdominal tenderness.  Musculoskeletal:        General: No tenderness.     Cervical back: Neck supple.  Skin:    General: Skin is warm.  Neurological:     Mental Status: He is alert and oriented to person, place, and time.     ED Results / Procedures / Treatments   Labs (all labs ordered are listed, but only abnormal results are displayed) Labs Reviewed  BASIC METABOLIC PANEL - Abnormal; Notable for the following components:      Result Value   Sodium 134 (*)    Glucose, Bld 194 (*)    All other components within normal limits  CBC - Abnormal; Notable for the following components:   RBC 4.18  (*)    Hemoglobin 12.7 (*)    HCT 36.5 (*)    All other components within normal limits  PROTIME-INR  HEPATIC FUNCTION PANEL  TSH  LACTIC ACID, PLASMA  TROPONIN I (HIGH SENSITIVITY)  TROPONIN I (HIGH SENSITIVITY)    EKG EKG Interpretation Date/Time:  Tuesday July 26 2022 15:06:16 EDT Ventricular  Rate:  60 PR Interval:  199 QRS Duration:  95 QT Interval:  402 QTC Calculation: 402 R Axis:   15  Text Interpretation: Sinus rhythm RSR' in V1 or V2, right VCD or RVH No significant change since last tracing Confirmed by Benjiman Core [phone]) on 07/26/2022 3:17:09 PM  Radiology DG Chest Port 1 View  Result Date: 07/26/2022 CLINICAL DATA:  Chest pain, shortness of breath and hypotension with bilateral arm and leg pain x1 week. EXAM: PORTABLE CHEST 1 VIEW COMPARISON:  January 31, 2022 FINDINGS: Multiple sternal wires and vascular clips are seen. A radiopaque vascular stent is also seen overlying the superior mediastinum on the left. There is marked severity calcification of the thoracic aorta. The heart size and mediastinal contours are within normal limits. Mild, diffuse, chronic appearing increased interstitial lung markings are seen. Mild atelectasis is noted along the infrahilar region on the left. There is no evidence of a pleural effusion or pneumothorax. Multilevel degenerative changes are seen throughout the thoracic spine. IMPRESSION: 1. Evidence of prior median sternotomy/CABG. 2. Mild left infrahilar atelectasis. Electronically Signed   By: Aram Candela M.D.   On: 07/26/2022 15:44    Procedures Procedures    Medications Ordered in ED Medications  sodium chloride 0.9 % bolus 500 mL (500 mLs Intravenous New Bag/Given 07/26/22 1750)    ED Course/ Medical Decision Making/ A&P                             Medical Decision Making Amount and/or Complexity of Data Reviewed Labs: ordered. Radiology: ordered.   Patient with hypotension and extremity pain.  States he feels  bad all over.  Differential diagnosis includes infection dehydration electrolyte abnormality.  Cardiac cause felt less likely does have some chronic disease.  EKG stable.  Chest pain appears stable.  Will Blood work to evaluate for causes such as dehydration and cardiac cause.  Chest x-ray reassuring.  I reviewed note from PCPs office.  Blood work overall reassuring.  Blood pressure improved with IV fluids.  No clear cause.  Did have slight orthostasis that improved with fluids.  Discharge home with outpatient follow-up as needed.  May need medicines adjusted but will let PCP or cardiology do this.        Final Clinical Impression(s) / ED Diagnoses Final diagnoses:  Hypotension, unspecified hypotension type  Myalgia    Rx / DC Orders ED Discharge Orders     None         Benjiman Core, MD 07/26/22 1858

## 2022-07-26 NOTE — ED Triage Notes (Signed)
Pt via POV sent from PCP for hypotension 80s/40s with bilateral arm and leg pain x 1 week. Pt is a/o x 4 and unsteady on feet. Pain rated 8/10 constant. Pt also reports CP and SOB. PMH includes angina, DM2, HLD, CABG x 2.

## 2022-07-26 NOTE — Discharge Instructions (Signed)
Follow-up with your doctor about potentially adjusting some of your medicines.  Several of them could decrease your blood pressure.

## 2022-07-29 DIAGNOSIS — M545 Low back pain, unspecified: Secondary | ICD-10-CM | POA: Diagnosis not present

## 2022-07-29 DIAGNOSIS — S0990XA Unspecified injury of head, initial encounter: Secondary | ICD-10-CM | POA: Diagnosis not present

## 2022-07-29 DIAGNOSIS — R531 Weakness: Secondary | ICD-10-CM | POA: Diagnosis not present

## 2022-07-29 DIAGNOSIS — E1169 Type 2 diabetes mellitus with other specified complication: Secondary | ICD-10-CM | POA: Diagnosis not present

## 2022-07-29 DIAGNOSIS — I10 Essential (primary) hypertension: Secondary | ICD-10-CM | POA: Diagnosis not present

## 2022-07-29 DIAGNOSIS — E162 Hypoglycemia, unspecified: Secondary | ICD-10-CM | POA: Diagnosis not present

## 2022-08-02 DIAGNOSIS — E785 Hyperlipidemia, unspecified: Secondary | ICD-10-CM | POA: Diagnosis not present

## 2022-08-02 DIAGNOSIS — I129 Hypertensive chronic kidney disease with stage 1 through stage 4 chronic kidney disease, or unspecified chronic kidney disease: Secondary | ICD-10-CM | POA: Diagnosis not present

## 2022-08-02 DIAGNOSIS — E1169 Type 2 diabetes mellitus with other specified complication: Secondary | ICD-10-CM | POA: Diagnosis not present

## 2022-08-02 DIAGNOSIS — E1165 Type 2 diabetes mellitus with hyperglycemia: Secondary | ICD-10-CM | POA: Diagnosis not present

## 2022-08-02 DIAGNOSIS — I10 Essential (primary) hypertension: Secondary | ICD-10-CM | POA: Diagnosis not present

## 2022-08-03 DIAGNOSIS — E1169 Type 2 diabetes mellitus with other specified complication: Secondary | ICD-10-CM | POA: Diagnosis not present

## 2022-08-03 DIAGNOSIS — K219 Gastro-esophageal reflux disease without esophagitis: Secondary | ICD-10-CM | POA: Diagnosis not present

## 2022-08-03 DIAGNOSIS — J440 Chronic obstructive pulmonary disease with acute lower respiratory infection: Secondary | ICD-10-CM | POA: Diagnosis not present

## 2022-08-03 DIAGNOSIS — I482 Chronic atrial fibrillation, unspecified: Secondary | ICD-10-CM | POA: Diagnosis not present

## 2022-08-10 DIAGNOSIS — E162 Hypoglycemia, unspecified: Secondary | ICD-10-CM | POA: Diagnosis not present

## 2022-08-10 DIAGNOSIS — S0990XA Unspecified injury of head, initial encounter: Secondary | ICD-10-CM | POA: Diagnosis not present

## 2022-08-10 DIAGNOSIS — R531 Weakness: Secondary | ICD-10-CM | POA: Diagnosis not present

## 2022-08-10 DIAGNOSIS — I10 Essential (primary) hypertension: Secondary | ICD-10-CM | POA: Diagnosis not present

## 2022-08-10 DIAGNOSIS — M545 Low back pain, unspecified: Secondary | ICD-10-CM | POA: Diagnosis not present

## 2022-08-10 DIAGNOSIS — E1169 Type 2 diabetes mellitus with other specified complication: Secondary | ICD-10-CM | POA: Diagnosis not present

## 2022-08-24 DIAGNOSIS — E1169 Type 2 diabetes mellitus with other specified complication: Secondary | ICD-10-CM | POA: Diagnosis not present

## 2022-08-24 DIAGNOSIS — S0990XA Unspecified injury of head, initial encounter: Secondary | ICD-10-CM | POA: Diagnosis not present

## 2022-08-24 DIAGNOSIS — I10 Essential (primary) hypertension: Secondary | ICD-10-CM | POA: Diagnosis not present

## 2022-08-24 DIAGNOSIS — E162 Hypoglycemia, unspecified: Secondary | ICD-10-CM | POA: Diagnosis not present

## 2022-08-24 DIAGNOSIS — Z6824 Body mass index (BMI) 24.0-24.9, adult: Secondary | ICD-10-CM | POA: Diagnosis not present

## 2022-08-24 DIAGNOSIS — R531 Weakness: Secondary | ICD-10-CM | POA: Diagnosis not present

## 2022-08-24 DIAGNOSIS — M545 Low back pain, unspecified: Secondary | ICD-10-CM | POA: Diagnosis not present

## 2022-09-07 DIAGNOSIS — E1169 Type 2 diabetes mellitus with other specified complication: Secondary | ICD-10-CM | POA: Diagnosis not present

## 2022-09-07 DIAGNOSIS — E162 Hypoglycemia, unspecified: Secondary | ICD-10-CM | POA: Diagnosis not present

## 2022-09-07 DIAGNOSIS — S0990XA Unspecified injury of head, initial encounter: Secondary | ICD-10-CM | POA: Diagnosis not present

## 2022-09-07 DIAGNOSIS — R531 Weakness: Secondary | ICD-10-CM | POA: Diagnosis not present

## 2022-09-07 DIAGNOSIS — I10 Essential (primary) hypertension: Secondary | ICD-10-CM | POA: Diagnosis not present

## 2022-09-07 DIAGNOSIS — M545 Low back pain, unspecified: Secondary | ICD-10-CM | POA: Diagnosis not present

## 2022-10-03 DIAGNOSIS — E1140 Type 2 diabetes mellitus with diabetic neuropathy, unspecified: Secondary | ICD-10-CM | POA: Diagnosis not present

## 2022-10-03 DIAGNOSIS — I10 Essential (primary) hypertension: Secondary | ICD-10-CM | POA: Diagnosis not present

## 2022-10-03 DIAGNOSIS — G629 Polyneuropathy, unspecified: Secondary | ICD-10-CM | POA: Diagnosis not present

## 2022-10-10 DIAGNOSIS — M545 Low back pain, unspecified: Secondary | ICD-10-CM | POA: Diagnosis not present

## 2022-10-10 DIAGNOSIS — R531 Weakness: Secondary | ICD-10-CM | POA: Diagnosis not present

## 2022-10-10 DIAGNOSIS — E1169 Type 2 diabetes mellitus with other specified complication: Secondary | ICD-10-CM | POA: Diagnosis not present

## 2022-10-10 DIAGNOSIS — E1165 Type 2 diabetes mellitus with hyperglycemia: Secondary | ICD-10-CM | POA: Diagnosis not present

## 2022-10-10 DIAGNOSIS — I10 Essential (primary) hypertension: Secondary | ICD-10-CM | POA: Diagnosis not present

## 2022-10-10 DIAGNOSIS — Z23 Encounter for immunization: Secondary | ICD-10-CM | POA: Diagnosis not present

## 2022-10-10 DIAGNOSIS — E162 Hypoglycemia, unspecified: Secondary | ICD-10-CM | POA: Diagnosis not present

## 2022-10-10 DIAGNOSIS — S0990XA Unspecified injury of head, initial encounter: Secondary | ICD-10-CM | POA: Diagnosis not present

## 2022-10-14 DIAGNOSIS — N182 Chronic kidney disease, stage 2 (mild): Secondary | ICD-10-CM | POA: Diagnosis not present

## 2022-10-14 DIAGNOSIS — E1122 Type 2 diabetes mellitus with diabetic chronic kidney disease: Secondary | ICD-10-CM | POA: Diagnosis not present

## 2022-10-19 DIAGNOSIS — N182 Chronic kidney disease, stage 2 (mild): Secondary | ICD-10-CM | POA: Diagnosis not present

## 2022-10-19 DIAGNOSIS — E1122 Type 2 diabetes mellitus with diabetic chronic kidney disease: Secondary | ICD-10-CM | POA: Diagnosis not present

## 2022-10-19 DIAGNOSIS — R809 Proteinuria, unspecified: Secondary | ICD-10-CM | POA: Diagnosis not present

## 2022-10-19 DIAGNOSIS — D631 Anemia in chronic kidney disease: Secondary | ICD-10-CM | POA: Diagnosis not present

## 2022-11-03 DIAGNOSIS — E1140 Type 2 diabetes mellitus with diabetic neuropathy, unspecified: Secondary | ICD-10-CM | POA: Diagnosis not present

## 2022-11-03 DIAGNOSIS — E1169 Type 2 diabetes mellitus with other specified complication: Secondary | ICD-10-CM | POA: Diagnosis not present

## 2022-11-03 DIAGNOSIS — E785 Hyperlipidemia, unspecified: Secondary | ICD-10-CM | POA: Diagnosis not present

## 2022-11-03 DIAGNOSIS — E1129 Type 2 diabetes mellitus with other diabetic kidney complication: Secondary | ICD-10-CM | POA: Diagnosis not present

## 2022-11-03 DIAGNOSIS — E11319 Type 2 diabetes mellitus with unspecified diabetic retinopathy without macular edema: Secondary | ICD-10-CM | POA: Diagnosis not present

## 2022-11-03 DIAGNOSIS — Z794 Long term (current) use of insulin: Secondary | ICD-10-CM | POA: Diagnosis not present

## 2022-11-03 DIAGNOSIS — R809 Proteinuria, unspecified: Secondary | ICD-10-CM | POA: Diagnosis not present

## 2022-11-16 DIAGNOSIS — R531 Weakness: Secondary | ICD-10-CM | POA: Diagnosis not present

## 2022-11-16 DIAGNOSIS — E1169 Type 2 diabetes mellitus with other specified complication: Secondary | ICD-10-CM | POA: Diagnosis not present

## 2022-11-16 DIAGNOSIS — S0990XA Unspecified injury of head, initial encounter: Secondary | ICD-10-CM | POA: Diagnosis not present

## 2022-11-16 DIAGNOSIS — M545 Low back pain, unspecified: Secondary | ICD-10-CM | POA: Diagnosis not present

## 2022-11-16 DIAGNOSIS — I10 Essential (primary) hypertension: Secondary | ICD-10-CM | POA: Diagnosis not present

## 2022-11-16 DIAGNOSIS — Z6822 Body mass index (BMI) 22.0-22.9, adult: Secondary | ICD-10-CM | POA: Diagnosis not present

## 2022-11-16 DIAGNOSIS — E162 Hypoglycemia, unspecified: Secondary | ICD-10-CM | POA: Diagnosis not present

## 2022-11-28 ENCOUNTER — Ambulatory Visit: Payer: Medicare Other | Attending: Cardiology | Admitting: Cardiology

## 2022-11-28 ENCOUNTER — Encounter: Payer: Self-pay | Admitting: Cardiology

## 2022-11-28 VITALS — BP 126/56 | HR 72 | Ht 66.0 in | Wt 137.2 lb

## 2022-11-28 DIAGNOSIS — Z951 Presence of aortocoronary bypass graft: Secondary | ICD-10-CM

## 2022-11-28 DIAGNOSIS — E782 Mixed hyperlipidemia: Secondary | ICD-10-CM

## 2022-11-28 DIAGNOSIS — I951 Orthostatic hypotension: Secondary | ICD-10-CM

## 2022-11-28 DIAGNOSIS — E785 Hyperlipidemia, unspecified: Secondary | ICD-10-CM | POA: Diagnosis not present

## 2022-11-28 DIAGNOSIS — E1169 Type 2 diabetes mellitus with other specified complication: Secondary | ICD-10-CM

## 2022-11-28 DIAGNOSIS — I25119 Atherosclerotic heart disease of native coronary artery with unspecified angina pectoris: Secondary | ICD-10-CM

## 2022-11-28 DIAGNOSIS — I10 Essential (primary) hypertension: Secondary | ICD-10-CM

## 2022-11-28 DIAGNOSIS — I739 Peripheral vascular disease, unspecified: Secondary | ICD-10-CM | POA: Diagnosis not present

## 2022-11-28 DIAGNOSIS — Z794 Long term (current) use of insulin: Secondary | ICD-10-CM

## 2022-11-28 NOTE — Progress Notes (Unsigned)
Cardiology Office Note:  .   Date:  11/29/2022  ID:  Almeta Monas, DOB 1932-01-25, MRN [id number] PCP: [facility] Providers Cardiologist:  Bryan Lemma, MD     Chief Complaint  Patient presents with   Follow-up    Doing okay.  Another issue with hypotension in July   Coronary Artery Disease    No angina    Patient Profile: .     [name] is a 89 y.o. male with a PMH notable for CAD-CABG (1995 with redo in 2001 - L Rad-dLAD after occlusion of LAD stent & atretic LIMA is not), chronic stable angina and MSK pain since surgery, HTN, HLD and DM-2 who presents here for 24-month follow-up at the request of [facility]   I last saw [name] in March 2023 as a hospital follow-up apparently for orthostatic syncope.  He had lost about 10 pounds since his hospitalization despite send that he was eating and drinking well.  Was pretty stable.  No further syncope.  Still has musculoskeletal pain.  Otherwise stable.  Worsening memory loss and unsteady gait.  He has been seen several times by APP's in the interim, most recently he saw Craig Levering, NP, on May 18, 2022 -> was stable Dr. Nanetta Batty standpoint.  Staying active doing outside activities.  Having to take breaks but doing well.  Discussed upcoming colonoscopy-okay to hold Plavix.  No changes made  ER visit 07/26/2022 for hypotension  Subjective  Discussed the use of AI scribe software for clinical note transcription with the patient, who gave verbal consent to proceed.  History of Present Illness   The patient, with a history of coronary artery disease, chronic stable angina, hypertension, hyperlipidemia, and type 2 diabetes mellitus, presented with complaints of unsteady gait, generalized body aches, and occasional low blood pressure. The patient also reported chronic musculoskeletal chest pain, which has remained stable.   He has chronic chest pain, particularly  around the sternum and left chest wall. This pain has been present since the patient's second coronary artery bypass graft surgery and is thought to be due to scar tissue formation.  He denies any chest tightness or pressure with rest or exertion, DOE or PND/orthopnea/edema.   He has intermittent numbness in the arms and discomfort in the legs, particularly in the calves and knees. The discomfort in the legs is exacerbated by walking and does not improve significantly with rest. The patient reported that he can walk approximately 450 feet before the discomfort becomes too severe.   The patient's diabetes management has recently been adjusted, with a new medication regimen that seems to be working better. The patient was taken off [place] and is now on [place], [place], and Humalog. The Humalog is taken before each meal, but only if blood sugar levels are above a certain threshold.  The patient's medications include doxazosin (2mg ), finasteride (5mg ), gabapentin (900mg  three times a day), clopidogrel, and rosuvastatin. The patient was advised to stop taking aspirin due to the risk of gastrointestinal upset and bleeding.  The patient's most recent labs showed well-controlled cholesterol levels, and the patient's blood pressure has been stable since the adjustment of his medication regimen. The patient's most recent A1c was 9.0, but it has since decreased to 8.0 with the new diabetes management plan.  The patient's physical activity has been limited due to his symptoms, but he continues to try to stay active. The patient reported that he  often feels like he is walking "like a drunk" due to his unsteady gait and leg discomfort. Despite these challenges, the patient continues to walk as much as possible, as advised by his healthcare provider.      ROS:  Review of Systems - Negative except symptoms noted in HPI   Objective   Current Meds  Medication Sig   acetaminophen (TYLENOL) 500 MG  tablet Take 1,000 mg by mouth every 6 (six) hours as needed for mild pain or headache.   albuterol (VENTOLIN HFA) 108 (90 Base) MCG/ACT inhaler Inhale 2 puffs into the lungs every 6 (six) hours as needed for wheezing or shortness of breath.   Brinzolamide-Brimonidine 1-0.2 % SUSP Place 1 drop into the right eye 3 (three) times daily.   Cholecalciferol (VITAMIN D) 2000 units tablet Take 2,000 Units by mouth daily with breakfast.   clopidogrel (PLAVIX) 75 MG tablet Take 75 mg by mouth daily.   diclofenac Sodium (VOLTAREN) 1 % GEL Apply 2 g topically daily as needed (knee pain).   doxazosin (CARDURA) 2 MG tablet Take 2 mg by mouth daily.   ferrous sulfate (FEROSUL) 325 (65 FE) MG tablet Take 325 mg by mouth daily with breakfast.   finasteride (PROSCAR) 5 MG tablet Take 5 mg by mouth daily.   gabapentin (NEURONTIN) 300 MG capsule Take 900 mg by mouth 3 (three) times daily.   HUMALOG KWIKPEN 100 UNIT/ML KwikPen Inject 3-6 Units into the skin See admin instructions. Inject 3 units subcutaneously prior to breakfast, 6 units prior to lunch and 3 units prior to dinner   Insulin Glargine-Lixisenatide 100-33 UNT-MCG/ML SOPN Inject 16 Units into the skin daily before breakfast. Lawana Chambers Solostar   isosorbide mononitrate (IMDUR) 30 MG 24 hr tablet Take 1 tablet (30 mg total) by mouth daily.   Omega-3 Fatty Acids (FISH OIL) 1000 MG CAPS Take 1,000 mg by mouth 2 (two) times daily with a meal.    pantoprazole (PROTONIX) 40 MG tablet TAKE 1 TABLET BY MOUTH TWICE DAILY BEFORE A MEAL   prednisoLONE acetate (PRED FORTE) 1 % ophthalmic suspension Place 1 drop into the left eye daily.   pyridoxine (B-6) 100 MG tablet Take 100 mg by mouth daily.   rosuvastatin (CRESTOR) 40 MG tablet TAKE 1 TABLET BY MOUTH DAILY   thiamine 250 MG tablet Take 250 mg by mouth daily.   vitamin C (ASCORBIC ACID) 500 MG tablet Take 500 mg by mouth 2 (two) times daily with a meal.    []  aspirin EC 81 MG tablet Take 81 mg by mouth daily with  breakfast.     Studies Reviewed: [name]        LABS (Aug 22, 2022)TC: 105 mg/dL; TG: 76 mg/dL ;HDL: 37 mg/dL; (0/8657 LDL: 50) (84/69/6295): HbA1c: 9.0%; Cr: 1.0 mg/dL; Potassium: 4.5 mmol/L;   RADIOLOGY Carotid Doppler: Not significant (2023)  DIAGNOSTIC Cardiac catheterization: No stents placed (2019). Cath 09/2017:  1. Severe LM & 3V Obstructive CAD (100% LAD, 90& RCA & LCx); 2. Patent frLRad-dLAD; Patent SVG-OM1; 4. Patent SVG-d RCA; 5. Normal LV function; 6. Normal LVEDP  Echocardiogram: No significant issues (10/2021): ECHO 10/21/2021: LVEF 60 to 65%.  No RWMA.  Normal diastolic parameters.  Normal RV.  Mild LA dilation.  Mild MR.  Tricuspid aortic valve.  Normal RAP and RVP.  Risk Assessment/Calculations:                 Physical Exam:   VS:  BP (!) 126/56 (BP Location: Left Arm, Patient Position: Sitting,  Cuff Size: Normal)   Pulse 72   Ht 5\' 6"  (1.676 m)   Wt 137 lb 3.2 oz (62.2 kg)   SpO2 96%   BMI 22.14 kg/m    Wt Readings from Last 3 Encounters:  11/28/22 137 lb 3.2 oz (62.2 kg)  07/26/22 130 lb (59 kg)  07/14/22 129 lb (58.5 kg)    GEN: Well nourished, well developed in no acute distress; Well-groomed.  Wears dark glasses NECK: No JVD; left sided carotid/subclavian bruit. CARDIAC:  RRR, Normal S1, S2; soft 1/6 SEM at RUSB; no rubs, gallops; chest wall tenderness bilateral parasternal and left midclavicular.  Also left lateral mid axillary RESPIRATORY:  Clear to auscultation without rales, wheezing or rhonchi ; nonlabored, good air movement. ABDOMEN: Soft, non-tender, non-distended EXTREMITIES:  No edema; No deformity abnormal/unsteady gait    ASSESSMENT AND PLAN: .    Problem List Items Addressed This Visit       Cardiology Problems   CAD -> CABG x3 then Re-DO CABG x1 (LRad-dLAD) after atretic LIMA & LAD stent occlusion. (Chronic)    Stable chronic angina and musculoskeletal chest pain. No new angina symptoms.  Last cardiac catheterization in 2019 with no  stents placed.  Last echocardiogram in October 2024 with no significant findings. -Continue Plavix and discontinue Aspirin to decrease likelihood of GI upset and bleeding. Okay to hold Plavix 5 to 7 days preop for surgeries or procedures. -No longer on beta-blocker or ARB due to concerns with blood pressure issues and orthostatic hypotension (thankfully on lower dose of doxazosin)      Relevant Medications   doxazosin (CARDURA) 2 MG tablet   Other Relevant Orders   VAS [place] LOWER EXTREMITY ARTERIAL DUPLEX   VAS [place] ABI WITH/WO TBI   Essential hypertension, benign (Chronic)    Recent episode of hypotension in July 2024, possibly related to medication.  Currently on Imdur 30 mg daily, and reduced dose doxazosin 2mg  daily-down from 8 mg. Because of hypotension issues no longer on beta-blocker or ARB. -Continue current regimen.      Relevant Medications   doxazosin (CARDURA) 2 MG tablet   Hyperlipidemia associated with type 2 diabetes mellitus (HCC) (Chronic)    Hyperlipidemia On Rosuvastatin 40mg  daily. Last cholesterol levels checked in July 2024 were well controlled. -Continue current regimen.  Diabetes Mellitus Recent change in medication regimen with improvement in blood glucose control. Currently on Soliqua, Solastar, and Humalog as needed. A1C was 9.0 in October 2024 and reportedly decreased to 8.0. -Continue current regimen.      Relevant Medications   Insulin Glargine-Lixisenatide 100-33 UNT-MCG/ML SOPN   doxazosin (CARDURA) 2 MG tablet   Orthostatic hypotension (Chronic)    No longer on ARB or beta-blocker.  Thankfully doxazosin dose reduced.      Relevant Medications   doxazosin (CARDURA) 2 MG tablet   Peripheral arterial disease - left vertebral and subclavian artery stenosis status post PTA/stent (Chronic)    Reports of leg pain and numbness, particularly in calves, with walking. Pain improves with rest. -Order Ankle-Brachial Index (ABI) to assess for peripheral artery  disease. -Encourage continued walking as tolerated.      Relevant Medications   doxazosin (CARDURA) 2 MG tablet   Other Relevant Orders   VAS [place] LOWER EXTREMITY ARTERIAL DUPLEX   VAS [place] ABI WITH/WO TBI     Other   S/P CABG (coronary artery bypass graft): Initial CABGX3 1995 (LIMA-LAD, SVG-OM 2, SVG-RPDA) --> redo CABG x1 FreeLRad-LAD for a occluded LAD with atretic  LIMA and failed attempted revascularization. - Primary (Chronic)    Neuropathy On Gabapentin 900mg  three times daily. Reports of chronic foot pain and numbness. -Continue current regimen.       Follow-Up: Return in about 1 year (around 11/28/2023) for 1 Yr Follow-up. Follow-up in 1 year if ABI results are normal. If abnormal, consider referral to vascular specialist.   Total time spent: 29 min spent with patient + limited talking17 min spent charting = 2046 min     Signed, Marykay Lex, MD, MS Bryan Lemma, M.D., M.S. Interventional Cardiologist  [facility]  Pager # [phone] Phone # [phone] [address] [place], [place] [address]

## 2022-11-28 NOTE — Patient Instructions (Addendum)
Medication Instructions:  Stop taking Aspirin   *If you need a refill on your cardiac medications before your next appointment, please call your pharmacy*   Lab Work: Not needed    Testing/Procedures:  Will be schedule  at  [address] Your physician has requested that you have an ankle brachial index (ABI). During this test an ultrasound and blood pressure cuff are used to evaluate the arteries that supply the arms and legs with blood. Allow thirty minutes for this exam. There are no restrictions or special instructions.  Please note: We ask at that you not bring children with you during ultrasound (echo/ vascular) testing. Due to room size and safety concerns, children are not allowed in the ultrasound rooms during exams. Our front office staff cannot provide observation of children in our lobby area while testing is being conducted. An adult accompanying a patient to their appointment will only be allowed in the ultrasound room at the discretion of the ultrasound technician under special circumstances. We apologize for any inconvenience.   And/or  Your physician has requested that you have a lower extremity arterial duplex. This test is an ultrasound of the arteries in the legs. It looks at arterial blood flow in the legs. Allow one hour for Lower Arterial scans. There are no restrictions or special instructions.  Please note: We ask at that you not bring children with you during ultrasound (echo/ vascular) testing. Due to room size and safety concerns, children are not allowed in the ultrasound rooms during exams. Our front office staff cannot provide observation of children in our lobby area while testing is being conducted. An adult accompanying a patient to their appointment will only be allowed in the ultrasound room at the discretion of the ultrasound technician under special circumstances. We apologize for any inconvenience.  Follow-Up: At [facility], you and  your health needs are our priority.  As part of our continuing mission to provide you with exceptional heart care, we have created designated Provider Care Teams.  These Care Teams include your primary Cardiologist (physician) and Advanced Practice Providers (APPs -  Physician Assistants and Nurse Practitioners) who all work together to provide you with the care you need, when you need it.     Your next appointment:   12 month(s)  The format for your next appointment:   In Person  Provider:   Bryan Lemma, MD

## 2022-11-29 ENCOUNTER — Encounter: Payer: Self-pay | Admitting: Cardiology

## 2022-11-29 ENCOUNTER — Telehealth: Payer: Self-pay | Admitting: Cardiology

## 2022-11-29 NOTE — Telephone Encounter (Signed)
Spoke with pt, aware of the date and time of the ABI's. He is going to have his daughter call, as she is his ride and may need to change the date.

## 2022-11-29 NOTE — Telephone Encounter (Signed)
Pt states he received a call from our office. Please advise

## 2022-11-29 NOTE — Assessment & Plan Note (Signed)
Recent episode of hypotension in July 2024, possibly related to medication.  Currently on Imdur 30 mg daily, and reduced dose doxazosin 2mg  daily-down from 8 mg. Because of hypotension issues no longer on beta-blocker or ARB. -Continue current regimen.

## 2022-11-29 NOTE — Assessment & Plan Note (Addendum)
Reports of leg pain and numbness, particularly in calves, with walking. Pain improves with rest. -Order Ankle-Brachial Index (ABI) to assess for peripheral artery disease. -Encourage continued walking as tolerated.

## 2022-11-29 NOTE — Assessment & Plan Note (Addendum)
Stable chronic angina and musculoskeletal chest pain. No new angina symptoms.  Last cardiac catheterization in 2019 with no stents placed.  Last echocardiogram in October 2024 with no significant findings. -Continue Plavix and discontinue Aspirin to decrease likelihood of GI upset and bleeding. Okay to hold Plavix 5 to 7 days preop for surgeries or procedures. -No longer on beta-blocker or ARB due to concerns with blood pressure issues and orthostatic hypotension (thankfully on lower dose of doxazosin)

## 2022-11-29 NOTE — Assessment & Plan Note (Signed)
Hyperlipidemia On Rosuvastatin 40mg  daily. Last cholesterol levels checked in July 2024 were well controlled. -Continue current regimen.  Diabetes Mellitus Recent change in medication regimen with improvement in blood glucose control. Currently on Soliqua, Solastar, and Humalog as needed. A1C was 9.0 in October 2024 and reportedly decreased to 8.0. -Continue current regimen.

## 2022-11-29 NOTE — Assessment & Plan Note (Signed)
No longer on ARB or beta-blocker.  Thankfully doxazosin dose reduced.

## 2022-12-02 DIAGNOSIS — E1140 Type 2 diabetes mellitus with diabetic neuropathy, unspecified: Secondary | ICD-10-CM | POA: Diagnosis not present

## 2022-12-02 DIAGNOSIS — I10 Essential (primary) hypertension: Secondary | ICD-10-CM | POA: Diagnosis not present

## 2022-12-02 DIAGNOSIS — D649 Anemia, unspecified: Secondary | ICD-10-CM | POA: Diagnosis not present

## 2022-12-05 DIAGNOSIS — Z6822 Body mass index (BMI) 22.0-22.9, adult: Secondary | ICD-10-CM | POA: Diagnosis not present

## 2022-12-05 DIAGNOSIS — J40 Bronchitis, not specified as acute or chronic: Secondary | ICD-10-CM | POA: Diagnosis not present

## 2022-12-05 DIAGNOSIS — H109 Unspecified conjunctivitis: Secondary | ICD-10-CM | POA: Diagnosis not present

## 2022-12-19 ENCOUNTER — Inpatient Hospital Stay (HOSPITAL_COMMUNITY): Admission: RE | Admit: 2022-12-19 | Payer: Medicare Other | Source: Ambulatory Visit

## 2022-12-23 ENCOUNTER — Ambulatory Visit (HOSPITAL_COMMUNITY): Payer: Medicare Other

## 2022-12-26 ENCOUNTER — Ambulatory Visit (HOSPITAL_COMMUNITY)
Admission: RE | Admit: 2022-12-26 | Discharge: 2022-12-26 | Disposition: A | Discharge status: Home / Self Care | Payer: Medicare Other | Source: Ambulatory Visit | Attending: Cardiology | Admitting: Cardiology

## 2022-12-26 DIAGNOSIS — I25119 Atherosclerotic heart disease of native coronary artery with unspecified angina pectoris: Secondary | ICD-10-CM | POA: Diagnosis not present

## 2022-12-26 DIAGNOSIS — I739 Peripheral vascular disease, unspecified: Secondary | ICD-10-CM | POA: Insufficient documentation

## 2022-12-27 LAB — VAS US ABI WITH/WO TBI
Left ABI: 1.17
Right ABI: 1.13

## 2022-12-28 ENCOUNTER — Other Ambulatory Visit: Payer: Self-pay | Admitting: Gastroenterology

## 2023-01-03 DIAGNOSIS — E1140 Type 2 diabetes mellitus with diabetic neuropathy, unspecified: Secondary | ICD-10-CM | POA: Diagnosis not present

## 2023-01-03 DIAGNOSIS — I10 Essential (primary) hypertension: Secondary | ICD-10-CM | POA: Diagnosis not present

## 2023-01-10 ENCOUNTER — Ambulatory Visit: Payer: Medicare Other | Admitting: Gastroenterology

## 2023-01-17 DIAGNOSIS — E1169 Type 2 diabetes mellitus with other specified complication: Secondary | ICD-10-CM | POA: Diagnosis not present

## 2023-01-17 DIAGNOSIS — D649 Anemia, unspecified: Secondary | ICD-10-CM | POA: Diagnosis not present

## 2023-01-17 DIAGNOSIS — E1140 Type 2 diabetes mellitus with diabetic neuropathy, unspecified: Secondary | ICD-10-CM | POA: Diagnosis not present

## 2023-01-17 DIAGNOSIS — N179 Acute kidney failure, unspecified: Secondary | ICD-10-CM | POA: Diagnosis not present

## 2023-01-17 DIAGNOSIS — E876 Hypokalemia: Secondary | ICD-10-CM | POA: Diagnosis not present

## 2023-01-24 ENCOUNTER — Encounter: Payer: Self-pay | Admitting: Gastroenterology

## 2023-01-24 ENCOUNTER — Ambulatory Visit: Payer: Medicare Other | Admitting: Gastroenterology

## 2023-01-24 VITALS — BP 148/62 | HR 65 | Temp 98.7°F | Ht 66.0 in | Wt 134.2 lb

## 2023-01-24 DIAGNOSIS — K5900 Constipation, unspecified: Secondary | ICD-10-CM

## 2023-01-24 DIAGNOSIS — Z8619 Personal history of other infectious and parasitic diseases: Secondary | ICD-10-CM

## 2023-01-24 DIAGNOSIS — Z8719 Personal history of other diseases of the digestive system: Secondary | ICD-10-CM

## 2023-01-24 DIAGNOSIS — R1310 Dysphagia, unspecified: Secondary | ICD-10-CM

## 2023-01-24 DIAGNOSIS — K219 Gastro-esophageal reflux disease without esophagitis: Secondary | ICD-10-CM

## 2023-01-24 NOTE — Progress Notes (Signed)
Gastroenterology Office Note     Primary Care Physician:  [facility]  Primary Gastroenterologist: Dr. Marletta Lor    Chief Complaint   Chief Complaint  Patient presents with   Follow-up    Follow up on GERD. Not sure if it is any better     History of Present Illness   [name] is an 89 y.o. male presenting today with a history of GERD, dysphagia, weight loss, anemia, mildly drifting Hgb but normal iron studies, undergoing colonoscopy and EGD along with CT last year.   Weight loss: in 2020 was in the mid 140s, 2021 high 130s, 2022 mid 120s-130s. 130 11/30/21. In Dec 2023 was 128. Feb 2024 was 120. April 2024 was 128. July 2024 was 129. He is 134 today.   States past few weeks he has had pain in head, stomach, and feet. Stomach feels a bit bloated. Has a BM every day but not always productive. No straining. Feels like the fiber wasn't helping. States it wasn't helpful and was taking it 3 times a day. Feels like food won't go down his esophagus. Has not used Miralax in awhile. Willing to try that again.    EGD/dilation July 2023 and found to have non-severe candida esophagitis, mild Schatzki ring s/p dilation, normal stomach and duodenal bulb, retained food in duodenum    Colonoscopy May 2024: diverticulosis.   Past Medical History:  Diagnosis Date   Arthritis    Asymptomatic stenosis of left vertebral artery 2002, 2005   Status post PTA/stent with redo; normal antegrade flow on dopplers 09/2013   CAD (coronary artery disease) 1995   1CABG x3; redo in CABG x 1 2001 Free Radial to LAD; All grafts patent by Cath 08/2014: The proximal to mid LAD has poor retrograde filling from the LIMA due to in-stent restenosis. 50% ostial disease of free radial artery to the LAD.   CAD (coronary artery disease) of artery bypass graft 2000   PCI x 2 - ostial & prox-mid LAD (BMS) for atretic LIMA-LAD   CAD in native artery 1995   CABG x 3 - LIMA-LAD, SVG-OM2, SVG-rPDA   Chest  pain 09/2017   GERD (gastroesophageal reflux disease)    Glaucoma    Hyperlipidemia    Hypertension, essential    S/P CABG x 3 1995   S/P Redo CABG x 1 2001   L Radial-LAD after 2 failed attempts @ LIMA-LAD PTCA; LAD stents 100% occluded   Stenosis of left subclavian artery (HCC) 11/2003   Status post PTA/stent --> < 50 % stenosis by Dopplers 09/2013   Type 2 diabetes mellitus (HCC) 2002    Past Surgical History:  Procedure Laterality Date   BALLOON DILATION N/A 07/07/2021   Procedure: BALLOON DILATION;  Surgeon: Lanelle Bal, DO;  Location: [facility];  Service: Endoscopy;  Laterality: N/A;   CARDIAC CATHETERIZATION N/A 08/04/2014   Procedure: Left Heart Cath and Cors/Grafts Angiography;  Surgeon: Marykay Lex, MD;  Location: [facility] INVASIVE CV LAB;  Service: Cardiovascular: o-mRCA 70%, m-dRCA ~70% - SVG-dRCA Patent.  o-pLAD 100% stent occluded, mLAD 95% ISR with poor retrograde filling from freeRadiial graft-LAD (50% ostial graft dz).  o-p Cx 80%. Widely patent SVG-Cx-OM fills retrograde to 80% lesion.; Normal LV Fxn & EDP   COLONOSCOPY WITH PROPOFOL N/A 05/31/2022   Procedure: COLONOSCOPY WITH PROPOFOL;  Surgeon: Lanelle Bal, DO;  Location: [facility];  Service: Endoscopy;  Laterality: N/A;  7:30am; asa 3  CORONARY ANGIOPLASTY  April and May 2001   After Both LAD stents occluded - PTCA of anastomatic LIMA-LAD lesion -- Unsuccessful.   CORONARY ARTERY BYPASS GRAFT  1995   LIMA-LAD, SVG-OM2, SVG-rPDA   CORONARY ARTERY BYPASS GRAFT  06/1999   Dr. Zenaida Niece Trigt: Redo LAD grafting with free Left Radial-distal LAD   CORONARY STENT PLACEMENT  1995-2000   2 BMS stents to osital-proximal & proximal-mid LAD; because of atretic LIMA-LDA   ESOPHAGEAL BRUSHING  07/07/2021   Procedure: ESOPHAGEAL BRUSHING;  Surgeon: Lanelle Bal, DO;  Location: [facility];  Service: Endoscopy;;   ESOPHAGOGASTRODUODENOSCOPY (EGD) WITH PROPOFOL N/A 07/07/2021   non-severe candida esophagitis,  mild Schatzki ring s/p dilation, normal stomach and duodenal bulb, retained food in duodenum.   LEFT HEART CATH AND CORONARY ANGIOGRAPHY N/A 09/18/2017   Procedure: LEFT HEART CATH AND CORONARY ANGIOGRAPHY;  Surgeon: [place]azil[place], Peter M, MD;  Location: [facility] INVASIVE CV LAB;  Service: Cardiovascular;  Laterality: N/A;   LEFT HEART CATHETERIZATION WITH CORONARY/GRAFT ANGIOGRAM N/A 08/22/2011   Procedure: LEFT HEART CATHETERIZATION WITH Isabel Caprice;  Surgeon: Marykay Lex, MD;  Location: [facility] CATH LAB;  Service: Cardiovascular:  Known occluded LIMA-LAD and ostial LAD. Moderate to severe proximal circumflex and RCA disease. Widely patent freeLRAD-dLAD, as well as SVG-RPDA (backfilling RPL), SVG-OM 2 (backfilling OM1)   SHOULDER OPEN ROTATOR CUFF REPAIR  10/2002   Dr. Thurston Hole   SP [facility] VERT OR THOR CAROTID STENT Left October 2002; November 2005   Left Vertebral stent placed in October 2002 (Dr. Titus Dubin); redo PCI in 2005   SUBCLAVIAN ARTERY STENT Left 11/2003   Dr. Allyson Sabal   TRANSTHORACIC ECHOCARDIOGRAM  04/10/2020   [facility]): Normal LV size and function.  EF 60 to 65%.  GR 1 DD.  Mild AI.  Mild to moderate LA dilation.  GR 1 DD.[name]   TRANSTHORACIC ECHOCARDIOGRAM  02/2021   EF 70 to 75%.  Hyperdynamic.  No RWMA.  GR 1 DD.  Normal RV, RVP and low normal RAP.  Normal valves.    Current Outpatient Medications  Medication Sig Dispense Refill   acetaminophen (TYLENOL) 500 MG tablet Take 1,000 mg by mouth every 6 (six) hours as needed for mild pain or headache.     albuterol (VENTOLIN HFA) 108 (90 Base) MCG/ACT inhaler Inhale 2 puffs into the lungs every 6 (six) hours as needed for wheezing or shortness of breath.     Brinzolamide-Brimonidine 1-0.2 % SUSP Place 1 drop into the right eye 3 (three) times daily.     Cholecalciferol (VITAMIN D) 2000 units tablet Take 2,000 Units by mouth daily with breakfast.     clopidogrel (PLAVIX) 75 MG tablet Take 75 mg by mouth daily.     diclofenac  Sodium (VOLTAREN) 1 % GEL Apply 2 g topically daily as needed (knee pain).     doxazosin (CARDURA) 2 MG tablet Take 2 mg by mouth daily.     doxazosin (CARDURA) 8 MG tablet Take 1 tablet (8 mg total) by mouth at bedtime. 30 tablet 1   ferrous sulfate (FEROSUL) 325 (65 FE) MG tablet Take 325 mg by mouth daily with breakfast.     finasteride (PROSCAR) 5 MG tablet Take 5 mg by mouth daily.     gabapentin (NEURONTIN) 300 MG capsule Take 900 mg by mouth 3 (three) times daily.     HUMALOG KWIKPEN 100 UNIT/ML KwikPen Inject 3-6 Units into the skin See admin instructions. Inject 3 units subcutaneously prior to breakfast, 6 units  prior to lunch and 3 units prior to dinner     Insulin Glargine-Lixisenatide 100-33 UNT-MCG/ML SOPN Inject 16 Units into the skin daily before breakfast. Lawana Chambers Solostar     isosorbide mononitrate (IMDUR) 30 MG 24 hr tablet Take 1 tablet (30 mg total) by mouth daily. 90 tablet 3   Omega-3 Fatty Acids (FISH OIL) 1000 MG CAPS Take 1,000 mg by mouth 2 (two) times daily with a meal.      pantoprazole (PROTONIX) 40 MG tablet TAKE 1 TABLET BY MOUTH TWICE DAILY BEFORE A MEAL 60 tablet 3   prednisoLONE acetate (PRED FORTE) 1 % ophthalmic suspension Place 1 drop into the left eye daily.     pyridoxine (B-6) 100 MG tablet Take 100 mg by mouth daily.     rosuvastatin (CRESTOR) 40 MG tablet TAKE 1 TABLET BY MOUTH DAILY 90 tablet 3   thiamine 250 MG tablet Take 250 mg by mouth daily.     vitamin C (ASCORBIC ACID) 500 MG tablet Take 500 mg by mouth 2 (two) times daily with a meal.      nitroGLYCERIN (NITROSTAT) 0.4 MG SL tablet Place 1 tablet (0.4 mg total) under the tongue every 5 (five) minutes as needed for up to 25 days for chest pain. And increase blood pressure greater than 170 systolic 25 tablet 6   No current facility-administered medications for this visit.    Allergies as of 01/24/2023 - Review Complete 01/24/2023  Allergen Reaction Noted   Iohexol Other (See Comments) 11/03/2002    Contrast media [iodinated contrast media]  11/09/2012   Metformin and related Diarrhea 11/11/2012    Family History  Problem Relation Age of Onset   Diabetes Mother    Diabetes Sister    Diabetes Brother     Social History   Socioeconomic History   Marital status: Married    Spouse name: Not on file   Number of children: Not on file   Years of education: Not on file   Highest education level: Not on file  Occupational History   Not on file  Tobacco Use   Smoking status: Former    Current packs/day: 3.00    Average packs/day: 3.0 packs/day for 30.0 years (90.0 ttl pk-yrs)    Types: Cigarettes   Smokeless tobacco: Never   Tobacco comments:    quit smoking about 50 years ago  Vaping Use   Vaping status: Never Used  Substance and Sexual Activity   Alcohol use: No   Drug use: No   Sexual activity: Not Currently  Other Topics Concern   Not on file  Social History Narrative   He is married with one daughter. He has 2 grandchildren.   He does not smoke. He quit smoking in roughly 1970 after smoking 3 packs per day.   He is routinely at give him. It does not necessarily do routine exercise.    Social Drivers of Corporate investment banker Strain: Not on file  Food Insecurity: No Food Insecurity (10/21/2021)   Hunger Vital Sign    Worried About Running Out of Food in the Last Year: Never true    Ran Out of Food in the Last Year: Never true  Transportation Needs: No Transportation Needs (10/21/2021)   PRAPARE - Administrator, Civil Service (Medical): No    Lack of Transportation (Non-Medical): No  Physical Activity: Not on file  Stress: Not on file  Social Connections: Unknown (08/03/2022)   Received from [facility]  Social Network    Social Network: Not on file  Intimate Partner Violence: Unknown (08/03/2022)   Received from [facility]   HITS    Physically Hurt: Not on file    Insult or Talk Down To: Not on file    Threaten Physical Harm: Not  on file    Scream or Curse: Not on file     Review of Systems   Gen: Denies any fever, chills, fatigue, weight loss, lack of appetite.  CV: Denies chest pain, heart palpitations, peripheral edema, syncope.  Resp: Denies shortness of breath at rest or with exertion. Denies wheezing or cough.  GI:see HPI GU : Denies urinary burning, urinary frequency, urinary hesitancy MS: Denies joint pain, muscle weakness, cramps, or limitation of movement.  Derm: Denies rash, itching, dry skin Psych: Denies depression, anxiety, memory loss, and confusion Heme: Denies bruising, bleeding, and enlarged lymph nodes.   Physical Exam   BP (!) 148/62   Pulse 65   Temp 98.7 F (37.1 C)   Ht 5\' 6"  (1.676 m)   Wt 134 lb 3.2 oz (60.9 kg)   BMI 21.66 kg/m  General:   Alert and oriented. Pleasant and cooperative. Well-nourished and well-developed.  Head:  Normocephalic and atraumatic. Eyes:  Without icterus Abdomen:  +BS, soft, non-tender and non-distended. No HSM noted. No guarding or rebound. No masses appreciated.  Rectal:  Deferred  Msk:  Symmetrical without gross deformities. Normal posture. Extremities:  Without edema. Neurologic:  Alert and  oriented x4;  grossly normal neurologically. Skin:  Intact without significant lesions or rashes. Psych:  Alert and cooperative. Normal mood and affect.   Assessment   [name] is an 89 y.o. male presenting today with a history of GERD, dysphagia, weight loss, anemia, mildly drifting Hgb but normal iron studies, here for routine follow-up.  Weight loss: s/p EGD, colonoscopy, CT on file, now resolved and improving. Weight 134 today, previously dropping to 120 in Feb 2024. He has gained about 5 lbs since last visit, which is encouraging.  Dysphagia: recurrent now. Hx of Candida esophagitis and Schatzki ring s/p dilation. Will arrange EGD/dilation. Continue PPI BID  Constipation: mild and with unproductive stools at times. No improvement with fiber.  Will start Miralax daily. Colonoscopy on file from May 2024.   PLAN    Proceed with upper endoscopy/dilation by Dr. Marletta Lor in near future: the risks, benefits, and alternatives have been discussed with the patient in detail. The patient states understanding and desires to proceed. ASA 3 Hold Plavix 5 days prior Miralax each morning Return in 2 months   Gelene Mink, PhD, [facility] Gastroenterology

## 2023-01-24 NOTE — Patient Instructions (Signed)
I recommend taking Miralax in 8 ounces of water daily.   We are arranging an upper endoscopy with dilation by Dr. Marletta Lor in the near future!  You will need to hold Plavix 5 days before the procedure. Do not take any insulin the morning of the procedure.   We will see you back in about 2 months!   I enjoyed seeing you again today! I value our relationship and want to provide genuine, compassionate, and quality care. You may receive a survey regarding your visit with me, and I welcome your feedback! Thanks so much for taking the time to complete this. I look forward to seeing you again.      Gelene Mink, PhD, ANP-BC [facility] Gastroenterology

## 2023-01-25 ENCOUNTER — Telehealth: Payer: Self-pay | Admitting: *Deleted

## 2023-01-25 ENCOUNTER — Encounter: Payer: Self-pay | Admitting: *Deleted

## 2023-01-25 NOTE — Telephone Encounter (Signed)
[employer] PA: Notification or Prior Authorization is not required for the requested services You are not required to submit a notification/prior authorization based on the information provided. If you have general questions about the prior authorization requirements, visit [email or web address] > Clinician Resources > Advance and Admission Notification Requirements. The number above acknowledges your notification. Please write this reference number down for future reference. If you would like to request an organization determination, please call us at [phone]. Decision ID #: [id number]

## 2023-01-26 DIAGNOSIS — E1165 Type 2 diabetes mellitus with hyperglycemia: Secondary | ICD-10-CM | POA: Diagnosis not present

## 2023-01-26 DIAGNOSIS — I25708 Atherosclerosis of coronary artery bypass graft(s), unspecified, with other forms of angina pectoris: Secondary | ICD-10-CM | POA: Diagnosis not present

## 2023-01-26 DIAGNOSIS — E785 Hyperlipidemia, unspecified: Secondary | ICD-10-CM | POA: Diagnosis not present

## 2023-02-01 DIAGNOSIS — Z794 Long term (current) use of insulin: Secondary | ICD-10-CM | POA: Diagnosis not present

## 2023-02-01 DIAGNOSIS — E1140 Type 2 diabetes mellitus with diabetic neuropathy, unspecified: Secondary | ICD-10-CM | POA: Diagnosis not present

## 2023-02-09 DIAGNOSIS — I25708 Atherosclerosis of coronary artery bypass graft(s), unspecified, with other forms of angina pectoris: Secondary | ICD-10-CM | POA: Diagnosis not present

## 2023-02-09 DIAGNOSIS — E785 Hyperlipidemia, unspecified: Secondary | ICD-10-CM | POA: Diagnosis not present

## 2023-02-09 DIAGNOSIS — Z0001 Encounter for general adult medical examination with abnormal findings: Secondary | ICD-10-CM | POA: Diagnosis not present

## 2023-02-09 DIAGNOSIS — E1165 Type 2 diabetes mellitus with hyperglycemia: Secondary | ICD-10-CM | POA: Diagnosis not present

## 2023-02-14 DIAGNOSIS — E1122 Type 2 diabetes mellitus with diabetic chronic kidney disease: Secondary | ICD-10-CM | POA: Diagnosis not present

## 2023-02-14 DIAGNOSIS — N182 Chronic kidney disease, stage 2 (mild): Secondary | ICD-10-CM | POA: Diagnosis not present

## 2023-02-21 DIAGNOSIS — R809 Proteinuria, unspecified: Secondary | ICD-10-CM | POA: Diagnosis not present

## 2023-02-21 DIAGNOSIS — N182 Chronic kidney disease, stage 2 (mild): Secondary | ICD-10-CM | POA: Diagnosis not present

## 2023-02-21 DIAGNOSIS — E1122 Type 2 diabetes mellitus with diabetic chronic kidney disease: Secondary | ICD-10-CM | POA: Diagnosis not present

## 2023-02-26 ENCOUNTER — Other Ambulatory Visit: Payer: Self-pay | Admitting: Cardiology

## 2023-03-08 ENCOUNTER — Other Ambulatory Visit: Payer: Self-pay | Admitting: Gastroenterology

## 2023-03-21 ENCOUNTER — Ambulatory Visit: Payer: Medicare Other | Admitting: Gastroenterology

## 2023-03-22 ENCOUNTER — Other Ambulatory Visit (HOSPITAL_COMMUNITY): Payer: Medicare Other

## 2023-03-22 ENCOUNTER — Encounter (HOSPITAL_COMMUNITY): Payer: Self-pay

## 2023-03-22 ENCOUNTER — Encounter (HOSPITAL_COMMUNITY)
Admission: RE | Admit: 2023-03-22 | Discharge: 2023-03-22 | Disposition: A | Discharge status: Home / Self Care | Payer: Medicare Other | Source: Ambulatory Visit | Attending: Internal Medicine | Admitting: Internal Medicine

## 2023-03-22 HISTORY — DX: Unspecified asthma, uncomplicated: J45.909

## 2023-03-22 HISTORY — DX: Polyneuropathy, unspecified: G62.9

## 2023-03-22 NOTE — Patient Instructions (Addendum)
 [name]  03/22/2023     @PREFPERIOPPHARMACY @   Your procedure is scheduled on 03/27/2023 .  Report to [facility] at 6:00 A.M.   Call this number if you have problems the morning of surgery:   [phone]  If you experience any cold or flu symptoms such as cough, fever, chills, shortness of breath, etc. between now and your scheduled surgery, please notify us at the above number.   Remember:   Do not eat after midnight.   You may drink clear liquids until 3:30 AM .  Clear liquids allowed are:                    Water, Juice (No red color; non-citric and without pulp; diabetics please choose diet or no sugar options), Clear Tea (No creamer, milk, or cream, including half & half and powdered creamer), and Clear Sports drink (No red color; diabetics please choose diet or no sugar options)    Take these medicines the morning of surgery with A SIP OF WATER : Gabapentin Isosorbide Pantoprazole Cardura   Do not take any diabetic medications the morning of the procedure.   Take only half of your nigh time insulin the night before the procedure.    Last dose of Plavix should be on 03/21/2023    Do not wear jewelry, make-up or nail polish, including gel polish,  artificial nails, or any other type of covering on natural nails (fingers and  toes).  Do not wear lotions, powders, or perfumes, or deodorant.  Do not shave 48 hours prior to surgery.  Men may shave face and neck.  Do not bring valuables to the hospital.  [facility] is not responsible for any belongings or valuables.  Contacts, dentures or bridgework may not be worn into surgery.  Leave your suitcase in the car.  After surgery it may be brought to your room.  For patients admitted to the hospital, discharge time will be determined by your treatment team.  Patients discharged the day of surgery will not be allowed to drive home.   Name and phone number of your driver:   family  Special instructions:   N/A  Please read over the following fact sheets that you were given.  Care and Recovery After Surgery   Upper Endoscopy, Adult Upper endoscopy is a procedure to look inside the upper GI (gastrointestinal) tract. The upper GI tract is made up of: The esophagus. This is the part of the body that moves food from your mouth to your stomach. The stomach. The duodenum. This is the first part of your small intestine. This procedure is also called esophagogastroduodenoscopy (EGD) or gastroscopy. In this procedure, your health care provider passes a thin, flexible tube (endoscope) through your mouth and down your esophagus into your stomach and into your duodenum. A small camera is attached to the end of the tube. Images from the camera appear on a monitor in the exam room. During this procedure, your health care provider may also remove a small piece of tissue to be sent to a lab and examined under a microscope (biopsy). Your health care provider may do an upper endoscopy to diagnose cancers of the upper GI tract. You may also have this procedure to find the cause of other conditions, such as: Stomach pain. Heartburn. Pain or problems when swallowing. Nausea and vomiting. Stomach bleeding. Stomach ulcers. Tell a health care provider about: Any allergies you have. All medicines you are taking,  including vitamins, herbs, eye drops, creams, and over-the-counter medicines. Any problems you or family members have had with anesthetic medicines. Any bleeding problems you have. Any surgeries you have had. Any medical conditions you have. Whether you are pregnant or may be pregnant. What are the risks? Your healthcare provider will talk with you about risks. These may include: Infection. Bleeding. Allergic reactions to medicines. A tear or hole (perforation) in the esophagus, stomach, or duodenum. What happens before the procedure? When to stop eating and drinking Follow instructions from your  health care provider about what you may eat and drink. These may include: 8 hours before your procedure Stop eating most foods. Do not eat meat, fried foods, or fatty foods. Eat only light foods, such as toast or crackers. All liquids are okay except energy drinks and alcohol. 6 hours before your procedure Stop eating. Drink only clear liquids, such as water, clear fruit juice, black coffee, plain tea, and sports drinks. Do not drink energy drinks or alcohol. 2 hours before your procedure Stop drinking all liquids. You may be allowed to take medicines with small sips of water. If you do not follow your health care provider's instructions, your procedure may be delayed or canceled. Medicines Ask your health care provider about: Changing or stopping your regular medicines. This is especially important if you are taking diabetes medicines or blood thinners. Taking medicines such as aspirin and ibuprofen. These medicines can thin your blood. Do not take these medicines unless your health care provider tells you to take them. Taking over-the-counter medicines, vitamins, herbs, and supplements. General instructions If you will be going home right after the procedure, plan to have a responsible adult: Take you home from the hospital or clinic. You will not be allowed to drive. Care for you for the time you are told. What happens during the procedure?  An IV will be inserted into one of your veins. You may be given one or more of the following: A medicine to help you relax (sedative). A medicine to numb the throat (local anesthetic). You will lie on your left side on an exam table. Your health care provider will pass the endoscope through your mouth and down your esophagus. Your health care provider will use the scope to check the inside of your esophagus, stomach, and duodenum. Biopsies may be taken. The endoscope will be removed. The procedure may vary among health care providers and  hospitals. What happens after the procedure? Your blood pressure, heart rate, breathing rate, and blood oxygen level will be monitored until you leave the hospital or clinic. When your throat is no longer numb, you may be given some fluids to drink. If you were given a sedative during the procedure, it can affect you for several hours. Do not drive or operate machinery until your health care provider says that it is safe. It is up to you to get the results of your procedure. Ask your health care provider, or the department that is doing the procedure, when your results will be ready. Contact a health care provider if you: Have a sore throat that lasts longer than 1 day. Have a fever. Get help right away if you: Vomit blood or your vomit looks like coffee grounds. Have bloody, black, or tarry stools. Have a very bad sore throat or you cannot swallow. Have difficulty breathing or very bad pain in your chest or abdomen. These symptoms may be an emergency. Get help right away. Call 911. Do not wait  to see if the symptoms will go away. Do not drive yourself to the hospital. Summary Upper endoscopy is a procedure to look inside the upper GI tract. During the procedure, an IV will be inserted into one of your veins. You may be given a medicine to help you relax. The endoscope will be passed through your mouth and down your esophagus. Follow instructions from your health care provider about what you can eat and drink. This information is not intended to replace advice given to you by your health care provider. Make sure you discuss any questions you have with your health care provider. Document Revised: 03/31/2021 Document Reviewed: 03/31/2021 Elsevier Patient Education  2024 [employer]  Esophageal Dilatation Esophageal dilatation, or dilation, is done to stretch a blocked or narrowed part of your esophagus. The esophagus is the part of your body that moves food from your mouth to your  stomach. You may need to have it stretched if: You have a lot of scar tissue and it makes it hard or painful to swallow. You have cancer of the esophagus. There's a problem with how food moves through your esophagus. In some cases, you may need to have this procedure done more than once. Tell a health care provider about: Any allergies you have. All medicines you're taking, including vitamins, herbs, eye drops, creams, and over-the-counter medicines. Any problems you or family members have had with anesthesia. Any bleeding problems you have. Any surgeries you've had. Any medical conditions you have. Whether you're pregnant or may be pregnant. What are the risks? Your health care provider will talk with you about risks. These may include: Bleeding. A hole or tear in your esophagus. What happens before the procedure? When to stop eating and drinking Follow instructions from your provider about what you may eat and drink. These may include: 8 hours before your procedure Stop eating most foods. Do not eat meat, fried foods, or fatty foods. Eat only light foods, such as toast or crackers. All liquids are okay except energy drinks and alcohol. 6 hours before your procedure Stop eating. Drink only clear liquids, such as water, clear fruit juice, black coffee, plain tea, and sports drinks. Do not drink energy drinks or alcohol. 2 hours before your procedure Stop drinking all liquids. You may be allowed to take medicines with small sips of water. If you don't follow your provider's instructions, your procedure may be delayed or canceled. Medicines Ask your provider about: Changing or stopping your regular medicines. These include any diabetes medicines or blood thinners you take. Taking medicines such as aspirin and ibuprofen. These medicines can thin your blood. Do not take them unless your provider tells you to. Taking over-the-counter medicines, vitamins, herbs, and  supplements. General instructions If you'll be going home right after the procedure, plan to have a responsible adult: Take you home from the hospital or clinic. You won't be allowed to drive. Care for you for the time you're told. What happens during the procedure? You may be given: A sedative. This helps you relax. Anesthesia. This keeps you from feeling pain. It will numb certain areas of your body. The stretching may be done with: Simple dilators. These are tools put in your esophagus to stretch it. Guide wires. These wires are put in using a tube called an endoscope. A dilator is put over the wires to stretch your esophagus. Then the wires are taken out. A balloon. The balloon is on the end of a tube. It's inflated to  stretch your esophagus. The procedure may vary among providers and hospitals. What can I expect after the procedure? Your blood pressure, heart rate, breathing rate, and blood oxygen level will be monitored until you leave the hospital or clinic. Your throat may feel sore and numb. This will get better over time. You won't be allowed to eat or drink until your throat is no longer numb. You may be able to go home when you can: Drink. Pee. Sit on the edge of the bed without nausea or dizziness. Follow these instructions at home: Activity If you were given a sedative during the procedure, it can affect you for several hours. Do not drive or operate machinery until your provider says it's safe. Return to your normal activities as told by your provider. Ask your provider what activities are safe for you. General instructions Take over-the-counter and prescription medicines only as told by your provider. Follow instructions from your provider about what you may eat and drink. Do not use any products that contain nicotine or tobacco. These products include cigarettes, chewing tobacco, and vaping devices, such as e-cigarettes. If you need help quitting, ask your  provider. Keep all follow-up visits. Your provider will make sure the procedure worked. Where to find more information American Society for Gastrointestinal Endoscopy (ASGE): asge.org Contact a health care provider if: You have trouble swallowing. You have a fever. Your pain doesn't get better with medicine. Get help right away if: You have chest pain. You have trouble breathing. You vomit blood. Your poop is: Black. Tarry. Bloody. These symptoms may be an emergency. Get help right away. Call 911. Do not wait to see if the symptoms will go away. Do not drive yourself to the hospital. This information is not intended to replace advice given to you by your health care provider. Make sure you discuss any questions you have with your health care provider. Document Revised: 03/18/2022 Document Reviewed: 03/18/2022 Elsevier Patient Education  2024 Elsevier Inc.  Monitored Anesthesia Care Anesthesia refers to the techniques, procedures, and medicines that help a person stay safe and comfortable during surgery. Monitored anesthesia care, or sedation, is one type of anesthesia. You may have sedation if you do not need to be asleep for your procedure. Procedures that use sedation may include: Surgery to remove cataracts from your eyes. A dental procedure. A biopsy. This is when a tissue sample is removed and looked at under a microscope. You will be watched closely during your procedure. Your level of sedation or type of anesthesia may be changed to fit your needs. Tell a health care provider about: Any allergies you have. All medicines you are taking, including vitamins, herbs, eye drops, creams, and over-the-counter medicines. Any problems you or family members have had with anesthesia. Any bleeding problems you have. Any surgeries you have had. Any medical conditions or illnesses you have. This includes sleep apnea, cough, fever, or the flu. Whether you are pregnant or may be  pregnant. Whether you use cigarettes, alcohol, or drugs. Any use of steroids, whether by mouth or as a cream. What are the risks? Your health care provider will talk with you about risks. These may include: Getting too much medicine (oversedation). Nausea. Allergic reactions to medicines. Trouble breathing. If this happens, a breathing tube may be used to help you breathe. It will be removed when you are awake and breathing on your own. Heart trouble. Lung trouble. Confusion that gets better with time (emergence delirium). What happens before the procedure? When  to stop eating and drinking Follow instructions from your health care provider about what you may eat and drink. These may include: 8 hours before your procedure Stop eating most foods. Do not eat meat, fried foods, or fatty foods. Eat only light foods, such as toast or crackers. All liquids are okay except energy drinks and alcohol. 6 hours before your procedure Stop eating. Drink only clear liquids, such as water, clear fruit juice, black coffee, plain tea, and sports drinks. Do not drink energy drinks or alcohol. 2 hours before your procedure Stop drinking all liquids. You may be allowed to take medicines with small sips of water. If you do not follow your health care provider's instructions, your procedure may be delayed or canceled. Medicines Ask your health care provider about: Changing or stopping your regular medicines. These include any diabetes medicines or blood thinners you take. Taking medicines such as aspirin and ibuprofen. These medicines can thin your blood. Do not take them unless your health care provider tells you to. Taking over-the-counter medicines, vitamins, herbs, and supplements. Testing You may have an exam or testing. You may have a blood or urine sample taken. General instructions Do not use any products that contain nicotine or tobacco for at least 4 weeks before the procedure. These products  include cigarettes, chewing tobacco, and vaping devices, such as e-cigarettes. If you need help quitting, ask your health care provider. If you will be going home right after the procedure, plan to have a responsible adult: Take you home from the hospital or clinic. You will not be allowed to drive. Care for you for the time you are told. What happens during the procedure?  Your blood pressure, heart rate, breathing, level of pain, and blood oxygen level will be monitored. An IV will be inserted into one of your veins. You may be given: A sedative. This helps you relax. Anesthesia. This will: Numb certain areas of your body. Make you fall asleep for surgery. You will be given medicines as needed to keep you comfortable. The more medicine you are given, the deeper your level of sedation will be. Your level of sedation may be changed to fit your needs. There are three levels of sedation: Mild sedation. At this level, you may feel awake and relaxed. You will be able to follow directions. Moderate sedation. At this level, you will be sleepy. You may not remember the procedure. Deep sedation. At this level, you will be asleep. You will not remember the procedure. How you get the medicines will depend on your age and the procedure. They may be given as: A pill. This may be taken by mouth (orally) or inserted into the rectum. An injection. This may be into a vein or muscle. A spray through the nose. After your procedure is over, the medicine will be stopped. The procedure may vary among health care providers and hospitals. What happens after the procedure? Your blood pressure, heart rate, breathing rate, and blood oxygen level will be monitored until you leave the hospital or clinic. You may feel sleepy, clumsy, or nauseous. You may not remember what happened during or after the procedure. Sedation can affect you for several hours. Do not drive or use machinery until your health care provider  says that it is safe. This information is not intended to replace advice given to you by your health care provider. Make sure you discuss any questions you have with your health care provider. Document Revised: 05/17/2021 Document Reviewed: 05/17/2021 Elsevier  Patient Education  2024 [employer].

## 2023-03-23 ENCOUNTER — Other Ambulatory Visit: Payer: Self-pay | Admitting: Cardiology

## 2023-03-27 ENCOUNTER — Encounter (HOSPITAL_COMMUNITY)
Admission: RE | Disposition: A | Discharge status: Home / Self Care | Payer: Self-pay | Source: Home / Self Care | Attending: Internal Medicine

## 2023-03-27 ENCOUNTER — Encounter (HOSPITAL_COMMUNITY): Payer: Self-pay | Admitting: Internal Medicine

## 2023-03-27 ENCOUNTER — Ambulatory Visit (HOSPITAL_COMMUNITY): Payer: Self-pay | Admitting: Anesthesiology

## 2023-03-27 ENCOUNTER — Other Ambulatory Visit: Payer: Self-pay

## 2023-03-27 ENCOUNTER — Ambulatory Visit (HOSPITAL_COMMUNITY)
Admission: RE | Admit: 2023-03-27 | Discharge: 2023-03-27 | Disposition: A | Discharge status: Home / Self Care | Payer: Medicare Other | Attending: Internal Medicine | Admitting: Internal Medicine

## 2023-03-27 ENCOUNTER — Ambulatory Visit (HOSPITAL_BASED_OUTPATIENT_CLINIC_OR_DEPARTMENT_OTHER): Payer: Self-pay | Admitting: Anesthesiology

## 2023-03-27 DIAGNOSIS — R1310 Dysphagia, unspecified: Secondary | ICD-10-CM | POA: Diagnosis not present

## 2023-03-27 DIAGNOSIS — I10 Essential (primary) hypertension: Secondary | ICD-10-CM | POA: Insufficient documentation

## 2023-03-27 DIAGNOSIS — Z87891 Personal history of nicotine dependence: Secondary | ICD-10-CM | POA: Insufficient documentation

## 2023-03-27 DIAGNOSIS — K219 Gastro-esophageal reflux disease without esophagitis: Secondary | ICD-10-CM | POA: Insufficient documentation

## 2023-03-27 DIAGNOSIS — Z794 Long term (current) use of insulin: Secondary | ICD-10-CM | POA: Diagnosis not present

## 2023-03-27 DIAGNOSIS — I2510 Atherosclerotic heart disease of native coronary artery without angina pectoris: Secondary | ICD-10-CM | POA: Insufficient documentation

## 2023-03-27 DIAGNOSIS — Z951 Presence of aortocoronary bypass graft: Secondary | ICD-10-CM | POA: Insufficient documentation

## 2023-03-27 DIAGNOSIS — K222 Esophageal obstruction: Secondary | ICD-10-CM | POA: Diagnosis not present

## 2023-03-27 DIAGNOSIS — J45909 Unspecified asthma, uncomplicated: Secondary | ICD-10-CM | POA: Insufficient documentation

## 2023-03-27 DIAGNOSIS — E119 Type 2 diabetes mellitus without complications: Secondary | ICD-10-CM | POA: Diagnosis not present

## 2023-03-27 HISTORY — PX: BALLOON DILATION: SHX5330

## 2023-03-27 HISTORY — PX: ESOPHAGOGASTRODUODENOSCOPY (EGD) WITH PROPOFOL: SHX5813

## 2023-03-27 LAB — GLUCOSE, CAPILLARY: Glucose-Capillary: 167 mg/dL — ABNORMAL HIGH (ref 70–99)

## 2023-03-27 SURGERY — ESOPHAGOGASTRODUODENOSCOPY (EGD) WITH PROPOFOL
Anesthesia: General

## 2023-03-27 MED ORDER — LACTATED RINGERS IV SOLN: INTRAVENOUS | Status: DC | PRN

## 2023-03-27 MED ORDER — SODIUM CHLORIDE 0.9% FLUSH: 3.0000 mL | Freq: Two times a day (BID) | INTRAVENOUS | Status: DC

## 2023-03-27 MED ORDER — PROPOFOL 10 MG/ML IV BOLUS
INTRAVENOUS | Status: DC | PRN
Start: 2023-03-27 — End: 2023-03-27
  Administered 2023-03-27: 80 mg via INTRAVENOUS
  Administered 2023-03-27: 40 mg via INTRAVENOUS

## 2023-03-27 MED ORDER — LIDOCAINE HCL (PF) 2 % IJ SOLN
INTRAMUSCULAR | Status: DC | PRN
  Administered 2023-03-27: 60 mg via INTRADERMAL

## 2023-03-27 MED ORDER — PHENYLEPHRINE 80 MCG/ML (10ML) SYRINGE FOR IV PUSH (FOR BLOOD PRESSURE SUPPORT)
PREFILLED_SYRINGE | INTRAVENOUS | Status: AC
  Filled 2023-03-27: qty 10

## 2023-03-27 MED ORDER — SODIUM CHLORIDE 0.9% FLUSH: 3.0000 mL | INTRAVENOUS | Status: DC | PRN

## 2023-03-27 MED ORDER — PHENYLEPHRINE 80 MCG/ML (10ML) SYRINGE FOR IV PUSH (FOR BLOOD PRESSURE SUPPORT)
PREFILLED_SYRINGE | INTRAVENOUS | Status: DC | PRN
  Administered 2023-03-27 (×2): 160 ug via INTRAVENOUS
  Administered 2023-03-27: 80 ug via INTRAVENOUS

## 2023-03-27 NOTE — Discharge Instructions (Signed)
 EGD Discharge instructions Please read the instructions outlined below and refer to this sheet in the next few weeks. These discharge instructions provide you with general information on caring for yourself after you leave the hospital. Your doctor may also give you specific instructions. While your treatment has been planned according to the most current medical practices available, unavoidable complications occasionally occur. If you have any problems or questions after discharge, please call your doctor. ACTIVITY You may resume your regular activity but move at a slower pace for the next 24 hours.  Take frequent rest periods for the next 24 hours.  Walking will help expel (get rid of) the air and reduce the bloated feeling in your abdomen.  No driving for 24 hours (because of the anesthesia (medicine) used during the test).  You may shower.  Do not sign any important legal documents or operate any machinery for 24 hours (because of the anesthesia used during the test).  NUTRITION Drink plenty of fluids.  You may resume your normal diet.  Begin with a light meal and progress to your normal diet.  Avoid alcoholic beverages for 24 hours or as instructed by your caregiver.  MEDICATIONS You may resume your normal medications unless your caregiver tells you otherwise.  WHAT YOU CAN EXPECT TODAY You may experience abdominal discomfort such as a feeling of fullness or "gas" pains.  FOLLOW-UP Your doctor will discuss the results of your test with you.  SEEK IMMEDIATE MEDICAL ATTENTION IF ANY OF THE FOLLOWING OCCUR: Excessive nausea (feeling sick to your stomach) and/or vomiting.  Severe abdominal pain and distention (swelling).  Trouble swallowing.  Temperature over 101 F (37.8 C).  Rectal bleeding or vomiting of blood.   Your upper endoscopy revealed a tightening of your esophagus called a Schatzki's ring.  I stretched this out today.  Hopefully this helps with feeling of food getting  stuck.  Stomach and small bowel were normal.  Continue on pantoprazole.  Follow-up in GI office in 2 to 3 months.   I hope you have a great rest of your week!  Hennie Duos. Marletta Lor, D.O. Gastroenterology and Hepatology [facility]

## 2023-03-27 NOTE — Anesthesia Procedure Notes (Signed)
 Date/Time: 03/27/2023 7:31 AM  Performed by: Julian Reil, CRNAPre-anesthesia Checklist: Patient identified, Emergency Drugs available, Suction available and Patient being monitored Patient Re-evaluated:Patient Re-evaluated prior to induction Oxygen Delivery Method: Nasal cannula Induction Type: IV induction Placement Confirmation: positive ETCO2

## 2023-03-27 NOTE — Transfer of Care (Signed)
 Immediate Anesthesia Transfer of Care Note  Patient: [name]  Procedure(s) Performed: ESOPHAGOGASTRODUODENOSCOPY (EGD) WITH PROPOFOL BALLOON DILATION  Patient Location: Short Stay  Anesthesia Type:General  Level of Consciousness: awake  Airway & Oxygen Therapy: Patient Spontanous Breathing  Post-op Assessment: Report given to RN and Post -op Vital signs reviewed and stable  Post vital signs: Reviewed and stable  Last Vitals:  Vitals Value Taken Time  BP 114/35 03/27/23 0745  Temp 36.6 C 03/27/23 0745  Pulse 62 03/27/23 0745  Resp    SpO2 99 % 03/27/23 0745    Last Pain:  Vitals:   03/27/23 0745  TempSrc: Oral  PainSc: 0-No pain         Complications: No notable events documented.

## 2023-03-27 NOTE — H&P (Signed)
 Primary Care Physician:  [facility] Primary Gastroenterologist:  Dr. Marletta Lor  Pre-Procedure History & Physical: HPI:  [name] is a 89 y.o. male is here for an EGD with possible dilation due to history of dysphagia  Past Medical History:  Diagnosis Date   Arthritis    Asthma    Asymptomatic stenosis of left vertebral artery 2002, 2005   Status post PTA/stent with redo; normal antegrade flow on dopplers 09/2013   CAD (coronary artery disease) 1995   1CABG x3; redo in CABG x 1 2001 Free Radial to LAD; All grafts patent by Cath 08/2014: The proximal to mid LAD has poor retrograde filling from the LIMA due to in-stent restenosis. 50% ostial disease of free radial artery to the LAD.   CAD (coronary artery disease) of artery bypass graft 2000   PCI x 2 - ostial & prox-mid LAD (BMS) for atretic LIMA-LAD   CAD in native artery 1995   CABG x 3 - LIMA-LAD, SVG-OM2, SVG-rPDA   Chest pain 09/2017   GERD (gastroesophageal reflux disease)    Glaucoma    Hyperlipidemia    Hypertension, essential    Neuropathy    feet   S/P CABG x 3 1995   S/P Redo CABG x 1 2001   L Radial-LAD after 2 failed attempts @ LIMA-LAD PTCA; LAD stents 100% occluded   Stenosis of left subclavian artery (HCC) 11/2003   Status post PTA/stent --> < 50 % stenosis by Dopplers 09/2013   Type 2 diabetes mellitus (HCC) 2002    Past Surgical History:  Procedure Laterality Date   BALLOON DILATION N/A 07/07/2021   Procedure: BALLOON DILATION;  Surgeon: Lanelle Bal, DO;  [facility];  Service: Endoscopy;  Laterality: N/A;   CARDIAC CATHETERIZATION N/A 08/04/2014   Procedure: Left Heart Cath and Cors/Grafts Angiography;  Surgeon: Marykay Lex, MD;  Location: [facility] INVASIVE CV LAB;  Service: Cardiovascular: o-mRCA 70%, m-dRCA ~70% - SVG-dRCA Patent.  o-pLAD 100% stent occluded, mLAD 95% ISR with poor retrograde filling from freeRadiial graft-LAD (50% ostial graft dz).  o-p Cx 80%. Widely patent  SVG-Cx-OM fills retrograde to 80% lesion.; Normal LV Fxn & EDP   COLONOSCOPY WITH PROPOFOL N/A 05/31/2022   Procedure: COLONOSCOPY WITH PROPOFOL;  Surgeon: Lanelle Bal, DO;  Location: [facility];  Service: Endoscopy;  Laterality: N/A;  7:30am; asa 3   CORONARY ANGIOPLASTY  April and May 2001   After Both LAD stents occluded - PTCA of anastomatic LIMA-LAD lesion -- Unsuccessful.   CORONARY ARTERY BYPASS GRAFT  1995   LIMA-LAD, SVG-OM2, SVG-rPDA   CORONARY ARTERY BYPASS GRAFT  06/1999   Dr. Zenaida Niece Trigt: Redo LAD grafting with free Left Radial-distal LAD   CORONARY STENT PLACEMENT  1995-2000   2 BMS stents to osital-proximal & proximal-mid LAD; because of atretic LIMA-LDA   ESOPHAGEAL BRUSHING  07/07/2021   Procedure: ESOPHAGEAL BRUSHING;  Surgeon: Lanelle Bal, DO;  Location: [facility];  Service: Endoscopy;;   ESOPHAGOGASTRODUODENOSCOPY (EGD) WITH PROPOFOL N/A 07/07/2021   non-severe candida esophagitis, mild Schatzki ring s/p dilation, normal stomach and duodenal bulb, retained food in duodenum.   EYE SURGERY Bilateral    LEFT HEART CATH AND CORONARY ANGIOGRAPHY N/A 09/18/2017   Procedure: LEFT HEART CATH AND CORONARY ANGIOGRAPHY;  Surgeon: Swazil[place], Peter M, MD;  Location: [facility] INVASIVE CV LAB;  Service: Cardiovascular;  Laterality: N/A;   LEFT HEART CATHETERIZATION WITH CORONARY/GRAFT ANGIOGRAM N/A 08/22/2011   Procedure: LEFT HEART CATHETERIZATION  WITH Isabel Caprice;  Surgeon: Marykay Lex, MD;  Location: [facility] CATH LAB;  Service: Cardiovascular:  Known occluded LIMA-LAD and ostial LAD. Moderate to severe proximal circumflex and RCA disease. Widely patent freeLRAD-dLAD, as well as SVG-RPDA (backfilling RPL), SVG-OM 2 (backfilling OM1)   SHOULDER OPEN ROTATOR CUFF REPAIR  10/2002   Dr. Thurston Hole   SP [facility] VERT OR THOR CAROTID STENT Left October 2002; November 2005   Left Vertebral stent placed in October 2002 (Dr. Titus Dubin); redo PCI in 2005   SUBCLAVIAN ARTERY  STENT Left 11/2003   Dr. Allyson Sabal   TRANSTHORACIC ECHOCARDIOGRAM  04/10/2020   [facility]): Normal LV size and function.  EF 60 to 65%.  GR 1 DD.  Mild AI.  Mild to moderate LA dilation.  GR 1 DD.[name]   TRANSTHORACIC ECHOCARDIOGRAM  02/2021   EF 70 to 75%.  Hyperdynamic.  No RWMA.  GR 1 DD.  Normal RV, RVP and low normal RAP.  Normal valves.    Prior to Admission medications   Medication Sig Start Date End Date Taking? Authorizing Provider  acetaminophen (TYLENOL) 500 MG tablet Take 1,000 mg by mouth every 6 (six) hours as needed for mild pain or headache.   Yes [provider]  albuterol (VENTOLIN HFA) 108 (90 Base) MCG/ACT inhaler Inhale 2 puffs into the lungs every 6 (six) hours as needed for wheezing or shortness of breath. 03/04/20  Yes [provider]  Brinzolamide-Brimonidine 1-0.2 % SUSP Place 1 drop into the right eye 3 (three) times daily.   Yes [provider]  Cholecalciferol (VITAMIN D) 2000 units tablet Take 2,000 Units by mouth daily with breakfast.   Yes [provider]  diclofenac Sodium (VOLTAREN) 1 % GEL Apply 2 g topically daily as needed (knee pain).   Yes [provider]  doxazosin (CARDURA) 2 MG tablet Take 2 mg by mouth daily.   Yes [provider]  doxazosin (CARDURA) 8 MG tablet Take 1 tablet (8 mg total) by mouth at bedtime. 02/05/21  Yes Vassie Loll, MD  ferrous sulfate (FEROSUL) 325 (65 FE) MG tablet Take 325 mg by mouth daily with breakfast.   Yes [provider]  finasteride (PROSCAR) 5 MG tablet Take 5 mg by mouth daily. 01/15/21  Yes [provider]  gabapentin (NEURONTIN) 300 MG capsule Take 900 mg by mouth 3 (three) times daily.   Yes [provider]  Insulin Glargine-Lixisenatide 100-33 UNT-MCG/ML SOPN Inject 26 Units into the skin daily before breakfast. Soliqua Solostar   Yes [provider]  isosorbide mononitrate (IMDUR) 30 MG 24 hr tablet Take 1 tablet (30 mg total) by  mouth daily. 02/11/22  Yes Monge, Petra Kuba, NP  Omega-3 Fatty Acids (FISH OIL) 1000 MG CAPS Take 1,000 mg by mouth 2 (two) times daily with a meal.    Yes [provider]  pantoprazole (PROTONIX) 40 MG tablet TAKE 1 TABLET BY MOUTH TWICE DAILY BEFORE A MEAL 03/08/23  Yes Gelene Mink, NP  prednisoLONE acetate (PRED FORTE) 1 % ophthalmic suspension Place 1 drop into the left eye daily.   Yes [provider]  pyridoxine (B-6) 100 MG tablet Take 100 mg by mouth daily.   Yes [provider]  rosuvastatin (CRESTOR) 40 MG tablet TAKE 1 TABLET BY MOUTH DAILY 02/27/23  Yes Marykay Lex, MD  thiamine 250 MG tablet Take 250 mg by mouth daily.   Yes [provider]  vitamin C (ASCORBIC ACID) 500 MG tablet Take 500  mg by mouth 2 (two) times daily with a meal.    Yes [provider]  clopidogrel (PLAVIX) 75 MG tablet Take 75 mg by mouth daily.    [provider]  HUMALOG KWIKPEN 100 UNIT/ML KwikPen Inject 8 Units into the skin See admin instructions. Inject 8 units subcutaneously prior to breakfast, 8 units prior to lunch and 8 units prior to dinner 02/07/22   [provider]  nitroGLYCERIN (NITROSTAT) 0.4 MG SL tablet DISSOLVE 1 TABLET UNDER THE TONGUE EVERY 5 MINUTES AS NEEDED FOR CHEST PAIN. DO NOT EXCEED A TOTAL OF 3 DOSES IN 15 MINUTES. 03/23/23   Marykay Lex, MD    Allergies as of 01/25/2023 - Review Complete 01/24/2023  Allergen Reaction Noted   Iohexol Other (See Comments) 11/03/2002   Contrast media [iodinated contrast media]  11/09/2012   Metformin and related Diarrhea 11/11/2012    Family History  Problem Relation Age of Onset   Diabetes Mother    Diabetes Sister    Diabetes Brother     Social History   Socioeconomic History   Marital status: Married    Spouse name: Not on file   Number of children: Not on file   Years of education: Not on file   Highest education level: Not on file  Occupational History   Not on file   Tobacco Use   Smoking status: Former    Current packs/day: 3.00    Average packs/day: 3.0 packs/day for 30.0 years (90.0 ttl pk-yrs)    Types: Cigarettes   Smokeless tobacco: Never   Tobacco comments:    quit smoking about 50 years ago  Vaping Use   Vaping status: Never Used  Substance and Sexual Activity   Alcohol use: No   Drug use: No   Sexual activity: Not Currently  Other Topics Concern   Not on file  Social History Narrative   He is married with one daughter. He has 2 grandchildren.   He does not smoke. He quit smoking in roughly 1970 after smoking 3 packs per day.   He is routinely at give him. It does not necessarily do routine exercise.    Social Drivers of Corporate investment banker Strain: Not on file  Food Insecurity: No Food Insecurity (10/21/2021)   Hunger Vital Sign    Worried About Running Out of Food in the Last Year: Never true    Ran Out of Food in the Last Year: Never true  Transportation Needs: No Transportation Needs (10/21/2021)   PRAPARE - Administrator, Civil Service (Medical): No    Lack of Transportation (Non-Medical): No  Physical Activity: Not on file  Stress: Not on file  Social Connections: Unknown (08/03/2022)   Received from [facility]   Social Network    Social Network: Not on file  Intimate Partner Violence: Unknown (08/03/2022)   Received from [facility]   HITS    Physically Hurt: Not on file    Insult or Talk Down To: Not on file    Threaten Physical Harm: Not on file    Scream or Curse: Not on file    Review of Systems: General: Negative for fever, chills, fatigue, weakness. Eyes: Negative for vision changes.  ENT: Negative for hoarseness, difficulty swallowing , nasal congestion. CV: Negative for chest pain, angina, palpitations, dyspnea on exertion, peripheral edema.  Respiratory: Negative for dyspnea at rest, dyspnea on exertion, cough, sputum, wheezing.  GI: See history of present illness. GU:  Negative  for dysuria, hematuria, urinary incontinence, urinary frequency, nocturnal urination.  MS: Negative for joint pain, low back pain.  Derm: Negative for rash or itching.  Neuro: Negative for weakness, abnormal sensation, seizure, frequent headaches, memory loss, confusion.  Psych: Negative for anxiety, depression Endo: Negative for unusual weight change.  Heme: Negative for bruising or bleeding. Allergy: Negative for rash or hives.  Physical Exam: Vital signs in last 24 hours: Temp:  [97.6 F (36.4 C)] 97.6 F (36.4 C) (03/24 0641) Pulse Rate:  [64] 64 (03/24 0641) Resp:  [11] 11 (03/24 0641) BP: (156)/(52) 156/52 (03/24 0641) SpO2:  [100 %] 100 % (03/24 0641) Weight:  [59 kg] 59 kg (03/24 0641)   General:   Alert,  Well-developed, well-nourished, pleasant and cooperative in NAD Head:  Normocephalic and atraumatic. Eyes:  Sclera clear, no icterus.   Conjunctiva pink. Ears:  Normal auditory acuity. Nose:  No deformity, discharge,  or lesions. Msk:  Symmetrical without gross deformities. Normal posture. Extremities:  Without clubbing or edema. Neurologic:  Alert and  oriented x4;  grossly normal neurologically. Skin:  Intact without significant lesions or rashes. Psych:  Alert and cooperative. Normal mood and affect.   Impression/Plan: [name] is here for an EGD with possible dilation due to history of dysphagia  Risks, benefits, limitations, imponderables and alternatives regarding procedure have been reviewed with the patient. Questions have been answered. All parties agreeable.

## 2023-03-27 NOTE — Op Note (Signed)
 [facility] Patient Name: [name] Procedure Date: 03/27/2023 7:01 AM MRN: [id number] Date of Birth: 1932-06-28 Attending MD: Hennie Duos. Marletta Lor , [place], [id number] CSN: [id number] Age: 89 Admit Type: Outpatient Procedure:                Upper GI endoscopy Indications:              Dysphagia Providers:                Hennie Duos. Marletta Lor, DO, Crystal Page, Dyann Ruddle Referring MD:              Medicines:                See the Anesthesia note for documentation of the                            administered medications Complications:            No immediate complications. Estimated Blood Loss:     Estimated blood loss was minimal. Procedure:                Pre-Anesthesia Assessment:                           - The anesthesia plan was to use monitored                            anesthesia care (MAC).                           After obtaining informed consent, the endoscope was                            passed under direct vision. Throughout the                            procedure, the patient's blood pressure, pulse, and                            oxygen saturations were monitored continuously. The                            GIF-H190 ([id number]) scope was introduced through the                            mouth, and advanced to the second part of duodenum.                            The upper GI endoscopy was accomplished without                            difficulty. The patient tolerated the procedure                            well. Scope In: 7:37:16 AM Scope Out: 7:42:26 AM Total Procedure Duration: 0 hours 5 minutes 10 seconds  Findings:      A mild Schatzki ring was found in the lower third  of the esophagus. A       TTS dilator was passed through the scope. Dilation with an 18-19-20 mm       balloon dilator was performed to 20 mm. The dilation site was examined       and showed moderate improvement in luminal narrowing. Ring then further       disrupted with cold  forceps.      The entire examined stomach was normal.      The duodenal bulb, first portion of the duodenum and second portion of       the duodenum were normal. Impression:               - Mild Schatzki ring. Dilated.                           - Normal stomach.                           - Normal duodenal bulb, first portion of the                            duodenum and second portion of the duodenum.                           - No specimens collected. Moderate Sedation:      Per Anesthesia Care Recommendation:           - Patient has a contact number available for                            emergencies. The signs and symptoms of potential                            delayed complications were discussed with the                            patient. Return to normal activities tomorrow.                            Written discharge instructions were provided to the                            patient.                           - Resume previous diet.                           - Continue present medications.                           - Repeat upper endoscopy PRN for retreatment.                           - Return to GI clinic in 3 months. Procedure Code(s):        --- Professional ---  [phone], Esophagogastroduodenoscopy, flexible,                            transoral; with transendoscopic balloon dilation of                            esophagus (less than 30 mm diameter) Diagnosis Code(s):        --- Professional ---                           K22.2, Esophageal obstruction                           R13.10, Dysphagia, unspecified CPT copyright 2022 American Medical Association. All rights reserved. The codes documented in this report are preliminary and upon coder review may  be revised to meet current compliance requirements. Hennie Duos. Marletta Lor, DO Hennie Duos. Marletta Lor, DO 03/27/2023 7:47:41 AM This report has been signed electronically. Number of Addenda: 0

## 2023-03-27 NOTE — Anesthesia Preprocedure Evaluation (Signed)
 Anesthesia Evaluation  Patient identified by MRN, date of birth, ID band Patient awake    Reviewed: Allergy & Precautions, H&P , NPO status , Patient's Chart, lab work & pertinent test results, reviewed documented beta blocker date and time   Airway Mallampati: II  TM Distance: >3 FB Neck ROM: full    Dental no notable dental hx.    Pulmonary neg pulmonary ROS, asthma , former smoker   Pulmonary exam normal breath sounds clear to auscultation       Cardiovascular Exercise Tolerance: Good hypertension, + angina  + CAD, + CABG and + Peripheral Vascular Disease   Rhythm:regular Rate:Normal     Neuro/Psych negative neurological ROS  negative psych ROS   GI/Hepatic negative GI ROS, Neg liver ROS,GERD  ,,  Endo/Other  negative endocrine ROSdiabetesHypothyroidism    Renal/GU negative Renal ROS  negative genitourinary   Musculoskeletal   Abdominal   Peds  Hematology negative hematology ROS (+)   Anesthesia Other Findings   Reproductive/Obstetrics negative OB ROS                             Anesthesia Physical Anesthesia Plan  ASA: 3  Anesthesia Plan: General   Post-op Pain Management:    Induction:   PONV Risk Score and Plan: Propofol infusion  Airway Management Planned:   Additional Equipment:   Intra-op Plan:   Post-operative Plan:   Informed Consent: I have reviewed the patients History and Physical, chart, labs and discussed the procedure including the risks, benefits and alternatives for the proposed anesthesia with the patient or authorized representative who has indicated his/her understanding and acceptance.     Dental Advisory Given  Plan Discussed with: CRNA  Anesthesia Plan Comments:        Anesthesia Quick Evaluation

## 2023-03-28 ENCOUNTER — Encounter (HOSPITAL_COMMUNITY): Payer: Self-pay | Admitting: Internal Medicine

## 2023-04-01 NOTE — Anesthesia Postprocedure Evaluation (Signed)
 Anesthesia Post Note  Patient: KAIDENCE CALLAWAY  Procedure(s) Performed: ESOPHAGOGASTRODUODENOSCOPY (EGD) WITH PROPOFOL BALLOON DILATION  Patient location during evaluation: Phase II Anesthesia Type: General Level of consciousness: awake Pain management: pain level controlled Vital Signs Assessment: post-procedure vital signs reviewed and stable Respiratory status: spontaneous breathing and respiratory function stable Cardiovascular status: blood pressure returned to baseline and stable Postop Assessment: no headache and no apparent nausea or vomiting Anesthetic complications: no Comments: Late entry   No notable events documented.   Last Vitals:  Vitals:   03/27/23 0750 03/27/23 0753  BP: (!) 103/31 (!) 110/46  Pulse:    Resp:    Temp:    SpO2: 98% 99%    Last Pain:  Vitals:   03/27/23 0745  TempSrc: Oral  PainSc: 0-No pain                 Windell Norfolk

## 2023-04-03 DIAGNOSIS — E1165 Type 2 diabetes mellitus with hyperglycemia: Secondary | ICD-10-CM | POA: Diagnosis not present

## 2023-04-03 DIAGNOSIS — I10 Essential (primary) hypertension: Secondary | ICD-10-CM | POA: Diagnosis not present

## 2023-04-03 DIAGNOSIS — K219 Gastro-esophageal reflux disease without esophagitis: Secondary | ICD-10-CM | POA: Diagnosis not present

## 2023-05-02 DIAGNOSIS — E1140 Type 2 diabetes mellitus with diabetic neuropathy, unspecified: Secondary | ICD-10-CM | POA: Diagnosis not present

## 2023-05-02 DIAGNOSIS — E1129 Type 2 diabetes mellitus with other diabetic kidney complication: Secondary | ICD-10-CM | POA: Diagnosis not present

## 2023-05-02 DIAGNOSIS — Z794 Long term (current) use of insulin: Secondary | ICD-10-CM | POA: Diagnosis not present

## 2023-05-02 DIAGNOSIS — E1169 Type 2 diabetes mellitus with other specified complication: Secondary | ICD-10-CM | POA: Diagnosis not present

## 2023-05-02 DIAGNOSIS — E11319 Type 2 diabetes mellitus with unspecified diabetic retinopathy without macular edema: Secondary | ICD-10-CM | POA: Diagnosis not present

## 2023-05-02 DIAGNOSIS — R809 Proteinuria, unspecified: Secondary | ICD-10-CM | POA: Diagnosis not present

## 2023-05-02 DIAGNOSIS — E785 Hyperlipidemia, unspecified: Secondary | ICD-10-CM | POA: Diagnosis not present

## 2023-05-03 DIAGNOSIS — I10 Essential (primary) hypertension: Secondary | ICD-10-CM | POA: Diagnosis not present

## 2023-05-03 DIAGNOSIS — K219 Gastro-esophageal reflux disease without esophagitis: Secondary | ICD-10-CM | POA: Diagnosis not present

## 2023-05-03 DIAGNOSIS — E1165 Type 2 diabetes mellitus with hyperglycemia: Secondary | ICD-10-CM | POA: Diagnosis not present

## 2023-05-04 DIAGNOSIS — D649 Anemia, unspecified: Secondary | ICD-10-CM | POA: Diagnosis not present

## 2023-05-04 DIAGNOSIS — E11649 Type 2 diabetes mellitus with hypoglycemia without coma: Secondary | ICD-10-CM | POA: Diagnosis not present

## 2023-05-04 DIAGNOSIS — Z1321 Encounter for screening for nutritional disorder: Secondary | ICD-10-CM | POA: Diagnosis not present

## 2023-05-04 DIAGNOSIS — I129 Hypertensive chronic kidney disease with stage 1 through stage 4 chronic kidney disease, or unspecified chronic kidney disease: Secondary | ICD-10-CM | POA: Diagnosis not present

## 2023-05-04 DIAGNOSIS — Z1329 Encounter for screening for other suspected endocrine disorder: Secondary | ICD-10-CM | POA: Diagnosis not present

## 2023-05-04 DIAGNOSIS — Z13220 Encounter for screening for lipoid disorders: Secondary | ICD-10-CM | POA: Diagnosis not present

## 2023-05-04 DIAGNOSIS — N179 Acute kidney failure, unspecified: Secondary | ICD-10-CM | POA: Diagnosis not present

## 2023-05-12 DIAGNOSIS — I10 Essential (primary) hypertension: Secondary | ICD-10-CM | POA: Diagnosis not present

## 2023-05-12 DIAGNOSIS — E1165 Type 2 diabetes mellitus with hyperglycemia: Secondary | ICD-10-CM | POA: Diagnosis not present

## 2023-05-12 DIAGNOSIS — I25708 Atherosclerosis of coronary artery bypass graft(s), unspecified, with other forms of angina pectoris: Secondary | ICD-10-CM | POA: Diagnosis not present

## 2023-05-12 DIAGNOSIS — Z0001 Encounter for general adult medical examination with abnormal findings: Secondary | ICD-10-CM | POA: Diagnosis not present

## 2023-05-12 DIAGNOSIS — E785 Hyperlipidemia, unspecified: Secondary | ICD-10-CM | POA: Diagnosis not present

## 2023-06-02 DIAGNOSIS — K219 Gastro-esophageal reflux disease without esophagitis: Secondary | ICD-10-CM | POA: Diagnosis not present

## 2023-06-02 DIAGNOSIS — E1165 Type 2 diabetes mellitus with hyperglycemia: Secondary | ICD-10-CM | POA: Diagnosis not present

## 2023-06-02 DIAGNOSIS — I10 Essential (primary) hypertension: Secondary | ICD-10-CM | POA: Diagnosis not present

## 2023-06-06 ENCOUNTER — Ambulatory Visit: Admitting: Gastroenterology

## 2023-06-06 VITALS — BP 158/67 | HR 67 | Temp 98.6°F | Ht 66.0 in | Wt 137.2 lb

## 2023-06-06 DIAGNOSIS — K219 Gastro-esophageal reflux disease without esophagitis: Secondary | ICD-10-CM

## 2023-06-06 DIAGNOSIS — E1122 Type 2 diabetes mellitus with diabetic chronic kidney disease: Secondary | ICD-10-CM | POA: Diagnosis not present

## 2023-06-06 DIAGNOSIS — R809 Proteinuria, unspecified: Secondary | ICD-10-CM | POA: Diagnosis not present

## 2023-06-06 DIAGNOSIS — K5900 Constipation, unspecified: Secondary | ICD-10-CM

## 2023-06-06 DIAGNOSIS — N182 Chronic kidney disease, stage 2 (mild): Secondary | ICD-10-CM | POA: Diagnosis not present

## 2023-06-06 NOTE — Patient Instructions (Signed)
 I will get the labs from [facility].  Let's start the lowest dose Linzess. Linzess works best when taken once a day every day, on an empty stomach, at least 30 minutes before your first meal of the day.  When Linzess is taken daily as directed:  *Constipation relief is typically felt in about a week *IBS-C patients may begin to experience relief from belly pain and overall abdominal symptoms (pain, discomfort, and bloating) in about 1 week,   with symptoms typically improving over 12 weeks.  Diarrhea may occur in the first 2 weeks -keep taking it.  The diarrhea should go away and you should start having normal, complete, full bowel movements. It may be helpful to start treatment when you can be near the comfort of your own bathroom, such as a weekend.   Let me know how this works for you! We can change this up if needed.  We will see you in 2 months!  I enjoyed seeing you again today! I value our relationship and want to provide genuine, compassionate, and quality care. You may receive a survey regarding your visit with me, and I welcome your feedback! Thanks so much for taking the time to complete this. I look forward to seeing you again.      Delman Ferns, PhD, ANP-BC [facility]

## 2023-06-06 NOTE — Progress Notes (Signed)
 Gastroenterology Office Note     Primary Care Physician:  [facility]  Primary Gastroenterologist:   Chief Complaint   Chief Complaint  Patient presents with   Follow-up    Follow up after procedure and GERD.     History of Present Illness   [name] is an 89 y.o. male presenting today with a history of GERD, dysphagia, weight loss, anemia, mildly drifting Hgb but normal iron studies, constipation,  undergoing colonoscopy and EGD along with CT as noted below. At last visit in Jan 2025, he was noting dysphagia again. He completed an EGD with dilation of Schatzki ring March 2025.    Weight loss: in 2020 was in the mid 140s, 2021 high 130s, 2022 mid 120s-130s. 130 11/30/21. In Dec 2023 was 128. Feb 2024 was 120. April 2024 was 128. July 2024 was 129. Jan 2025 134. Today 137.   Dysphagia has resolved s/p dilation. BM every morning. He feels like it's hard to get the stool started. Even with Miralax. Not taking fiber any longer. States this was not very helpful for him.   He was told his thyroid  numbers "elevated".   Recent labs done at [facility], which we will request.   March 2025 EGD with mild schatzki ring s/p dilation, normal stomach and duodenum.   EGD/dilation July 2023 and found to have non-severe candida esophagitis, mild Schatzki ring s/p dilation, normal stomach and duodenal bulb, retained food in duodenum    Colonoscopy May 2024: diverticulosis.    CT abd/pelvis without contrast Jan 2024 1. A specific cause for the patient's left flank pain is not  identified. No urinary tract calculi are currently identified.  2. Sigmoid colon diverticulosis.  3. Mild lumbar spondylosis and degenerative disc disease.  4. Aortic atherosclerosis. Prior CABG.  Past Medical History:  Diagnosis Date   Arthritis    Asthma    Asymptomatic stenosis of left vertebral artery 2002, 2005   Status post PTA/stent with redo; normal antegrade flow on dopplers  09/2013   CAD (coronary artery disease) 1995   1CABG x3; redo in CABG x 1 2001 Free Radial to LAD; All grafts patent by Cath 08/2014: The proximal to mid LAD has poor retrograde filling from the LIMA due to in-stent restenosis. 50% ostial disease of free radial artery to the LAD.   CAD (coronary artery disease) of artery bypass graft 2000   PCI x 2 - ostial & prox-mid LAD (BMS) for atretic LIMA-LAD   CAD in native artery 1995   CABG x 3 - LIMA-LAD, SVG-OM2, SVG-rPDA   Chest pain 09/2017   GERD (gastroesophageal reflux disease)    Glaucoma    Hyperlipidemia    Hypertension, essential    Neuropathy    feet   S/P CABG x 3 1995   S/P Redo CABG x 1 2001   L Radial-LAD after 2 failed attempts @ LIMA-LAD PTCA; LAD stents 100% occluded   Stenosis of left subclavian artery (HCC) 11/2003   Status post PTA/stent --> < 50 % stenosis by Dopplers 09/2013   Type 2 diabetes mellitus (HCC) 2002    Past Surgical History:  Procedure Laterality Date   BALLOON DILATION N/A 07/07/2021   Procedure: BALLOON DILATION;  Surgeon: Vinetta Greening, DO;  [facility];  Service: Endoscopy;  Laterality: N/A;   BALLOON DILATION N/A 03/27/2023   Procedure: BALLOON DILATION;  Surgeon: Vinetta Greening, DO;  [facility]: [facility];  Service: Endoscopy;  Laterality: N/A;  7:30 AM, ASA 3   CARDIAC CATHETERIZATION N/A 08/04/2014   Procedure: Left Heart Cath and Cors/Grafts Angiography;  Surgeon: Arleen Lacer, MD;  Location: [facility] INVASIVE CV LAB;  Service: Cardiovascular: o-mRCA 70%, m-dRCA ~70% - SVG-dRCA Patent.  o-pLAD 100% stent occluded, mLAD 95% ISR with poor retrograde filling from freeRadiial graft-LAD (50% ostial graft dz).  o-p Cx 80%. Widely patent SVG-Cx-OM fills retrograde to 80% lesion.; Normal LV Fxn & EDP   COLONOSCOPY WITH PROPOFOL  N/A 05/31/2022   Procedure: COLONOSCOPY WITH PROPOFOL ;  Surgeon: Vinetta Greening, DO;  [facility]: [facility];  Service: Endoscopy;  Laterality: N/A;  7:30am; asa 3    CORONARY ANGIOPLASTY  April and May 2001   After Both LAD stents occluded - PTCA of anastomatic LIMA-LAD lesion -- Unsuccessful.   CORONARY ARTERY BYPASS GRAFT  1995   LIMA-LAD, SVG-OM2, SVG-rPDA   CORONARY ARTERY BYPASS GRAFT  06/1999   Dr. Carloyn Chi Trigt: Redo LAD grafting with free Left Radial-distal LAD   CORONARY STENT PLACEMENT  1995-2000   2 BMS stents to osital-proximal & proximal-mid LAD; because of atretic LIMA-LDA   ESOPHAGEAL BRUSHING  07/07/2021   Procedure: ESOPHAGEAL BRUSHING;  Surgeon: Vinetta Greening, DO;  [facility]: [facility];  Service: Endoscopy;;   ESOPHAGOGASTRODUODENOSCOPY (EGD) WITH PROPOFOL  N/A 07/07/2021   non-severe candida esophagitis, mild Schatzki ring s/p dilation, normal stomach and duodenal bulb, retained food in duodenum.   ESOPHAGOGASTRODUODENOSCOPY (EGD) WITH PROPOFOL  N/A 03/27/2023   Procedure: ESOPHAGOGASTRODUODENOSCOPY (EGD) WITH PROPOFOL ;  Surgeon: Vinetta Greening, DO;  [facility]: [facility];  Service: Endoscopy;  Laterality: N/A;  7:30 AM, ASA 3   EYE SURGERY Bilateral    LEFT HEART CATH AND CORONARY ANGIOGRAPHY N/A 09/18/2017   Procedure: LEFT HEART CATH AND CORONARY ANGIOGRAPHY;  Surgeon: Swaziland, Peter M, MD;  Location: [facility] INVASIVE CV LAB;  Service: Cardiovascular;  Laterality: N/A;   LEFT HEART CATHETERIZATION WITH CORONARY/GRAFT ANGIOGRAM N/A 08/22/2011   Procedure: LEFT HEART CATHETERIZATION WITH Estella Helling;  Surgeon: Arleen Lacer, MD;  Location: [facility] CATH LAB;  Service: Cardiovascular:  Known occluded LIMA-LAD and ostial LAD. Moderate to severe proximal circumflex and RCA disease. Widely patent freeLRAD-dLAD, as well as SVG-RPDA (backfilling RPL), SVG-OM 2 (backfilling OM1)   SHOULDER OPEN ROTATOR CUFF REPAIR  10/2002   Dr. Jinger Mount   SP [facility] VERT OR THOR CAROTID STENT Left October 2002; November 2005   Left Vertebral stent placed in October 2002 (Dr. Georgiann Kirsch); redo PCI in 2005   SUBCLAVIAN ARTERY STENT Left 11/2003   Dr.  Katheryne Pane   TRANSTHORACIC ECHOCARDIOGRAM  04/10/2020   [facility]): Normal LV size and function.  EF 60 to 65%.  GR 1 DD.  Mild AI.  Mild to moderate LA dilation.  GR 1 DD.Aaron Aas   TRANSTHORACIC ECHOCARDIOGRAM  02/2021   EF 70 to 75%.  Hyperdynamic.  No RWMA.  GR 1 DD.  Normal RV, RVP and low normal RAP.  Normal valves.    Current Outpatient Medications  Medication Sig Dispense Refill   acetaminophen  (TYLENOL ) 500 MG tablet Take 1,000 mg by mouth every 6 (six) hours as needed for mild pain or headache.     albuterol  (VENTOLIN  HFA) 108 (90 Base) MCG/ACT inhaler Inhale 2 puffs into the lungs every 6 (six) hours as needed for wheezing or shortness of breath.     Brinzolamide -Brimonidine  1-0.2 % SUSP Place 1 drop into the right eye 3 (three) times daily.     Cholecalciferol  (VITAMIN D ) 2000  units tablet Take 2,000 Units by mouth daily with breakfast.     clopidogrel  (PLAVIX ) 75 MG tablet Take 75 mg by mouth daily.     diclofenac Sodium (VOLTAREN) 1 % GEL Apply 2 g topically daily as needed (knee pain).     doxazosin  (CARDURA ) 2 MG tablet Take 2 mg by mouth daily.     doxazosin  (CARDURA ) 8 MG tablet Take 1 tablet (8 mg total) by mouth at bedtime. 30 tablet 1   ferrous sulfate (FEROSUL) 325 (65 FE) MG tablet Take 325 mg by mouth daily with breakfast.     finasteride  (PROSCAR ) 5 MG tablet Take 5 mg by mouth daily.     gabapentin  (NEURONTIN ) 300 MG capsule Take 900 mg by mouth 3 (three) times daily.     HUMALOG KWIKPEN 100 UNIT/ML KwikPen Inject 8 Units into the skin See admin instructions. Inject 8 units subcutaneously prior to breakfast, 8 units prior to lunch and 8 units prior to dinner     Insulin  Glargine-Lixisenatide 100-33 UNT-MCG/ML SOPN Inject 26 Units into the skin daily before breakfast. Soliqua Solostar     isosorbide  mononitrate (IMDUR ) 30 MG 24 hr tablet Take 1 tablet (30 mg total) by mouth daily. 90 tablet 3   nitroGLYCERIN  (NITROSTAT ) 0.4 MG SL tablet DISSOLVE 1 TABLET UNDER THE TONGUE  EVERY 5 MINUTES AS NEEDED FOR CHEST PAIN. DO NOT EXCEED A TOTAL OF 3 DOSES IN 15 MINUTES. 25 tablet 8   Omega-3 Fatty Acids (FISH OIL) 1000 MG CAPS Take 1,000 mg by mouth 2 (two) times daily with a meal.      pantoprazole  (PROTONIX ) 40 MG tablet TAKE 1 TABLET BY MOUTH TWICE DAILY BEFORE A MEAL 60 tablet 3   prednisoLONE  acetate (PRED FORTE ) 1 % ophthalmic suspension Place 1 drop into the left eye daily.     pyridoxine (B-6) 100 MG tablet Take 100 mg by mouth daily.     rosuvastatin  (CRESTOR ) 40 MG tablet TAKE 1 TABLET BY MOUTH DAILY 90 tablet 2   thiamine 250 MG tablet Take 250 mg by mouth daily.     vitamin C  (ASCORBIC ACID ) 500 MG tablet Take 500 mg by mouth 2 (two) times daily with a meal.      No current facility-administered medications for this visit.    Allergies as of 06/06/2023 - Review Complete 06/06/2023  Allergen Reaction Noted   Iohexol  Other (See Comments) 11/03/2002   Contrast media [iodinated contrast media]  11/09/2012   Metformin  and related Diarrhea 11/11/2012    Family History  Problem Relation Age of Onset   Diabetes Mother    Diabetes Sister    Diabetes Brother     Social History   Socioeconomic History   Marital status: Married    Spouse name: Not on file   Number of children: Not on file   Years of education: Not on file   Highest education level: Not on file  Occupational History   Not on file  Tobacco Use   Smoking status: Former    Current packs/day: 3.00    Average packs/day: 3.0 packs/day for 30.0 years (90.0 ttl pk-yrs)    Types: Cigarettes   Smokeless tobacco: Never   Tobacco comments:    quit smoking about 50 years ago  Vaping Use   Vaping status: Never Used  Substance and Sexual Activity   Alcohol use: No   Drug use: No   Sexual activity: Not Currently  Other Topics Concern   Not on file  Social History Narrative   He is married with one daughter. He has 2 grandchildren.   He does not smoke. He quit smoking in roughly 1970 after  smoking 3 packs per day.   He is routinely at give him. It does not necessarily do routine exercise.    Social Drivers of Corporate investment banker Strain: Not on file  Food Insecurity: No Food Insecurity (10/21/2021)   Hunger Vital Sign    Worried About Running Out of Food in the Last Year: Never true    Ran Out of Food in the Last Year: Never true  Transportation Needs: No Transportation Needs (10/21/2021)   PRAPARE - Administrator, Civil Service (Medical): No    Lack of Transportation (Non-Medical): No  Physical Activity: Not on file  Stress: Not on file  Social Connections: Unknown (08/03/2022)   Received from [facility]   Social Network    Social Network: Not on file  Intimate Partner Violence: Unknown (08/03/2022)   Received from [facility]   HITS    Physically Hurt: Not on file    Insult or Talk Down To: Not on file    Threaten Physical Harm: Not on file    Scream or Curse: Not on file     Review of Systems   Gen: Denies any fever, chills, fatigue, weight loss, lack of appetite.  CV: Denies chest pain, heart palpitations, peripheral edema, syncope.  Resp: Denies shortness of breath at rest or with exertion. Denies wheezing or cough.  GI: Denies dysphagia or odynophagia. Denies jaundice, hematemesis, fecal incontinence. GU : Denies urinary burning, urinary frequency, urinary hesitancy MS: Denies joint pain, muscle weakness, cramps, or limitation of movement.  Derm: Denies rash, itching, dry skin Psych: Denies depression, anxiety, memory loss, and confusion Heme: Denies bruising, bleeding, and enlarged lymph nodes.   Physical Exam   BP (!) 158/67   Pulse 67   Temp 98.6 F (37 C)   Ht 5\' 6"  (1.676 m)   Wt 137 lb 3.2 oz (62.2 kg)   BMI 22.14 kg/m  General:   Alert and oriented. Pleasant and cooperative. Well-nourished and well-developed.  Head:  Normocephalic and atraumatic. Eyes:  Without icterus Abdomen:  +BS, soft, non-tender and  non-distended. No HSM noted. No guarding or rebound. No masses appreciated.  Rectal:  Deferred  Msk:  Symmetrical without gross deformities. Normal posture. Extremities:  Without edema. Neurologic:  Alert and  oriented x4;  grossly normal neurologically. Skin:  Intact without significant lesions or rashes. Psych:  Alert and cooperative. Normal mood and affect.   Assessment   [name] is an  89 y.o. male presenting today with a history of GERD, dysphagia, weight loss, anemia, mildly drifting Hgb but normal iron studies, constipation,  undergoing colonoscopy and EGD along with CT as noted above. At last visit in Jan 2025, he was noting dysphagia again. He completed an EGD with dilation of Schatzki ring March 2025.   Dysphagia: now resolved s/p dilation. Continue on PPI BID. GERD controlled on this.  Weight loss: continues to improve each visit and is near his baseline. No signs of chronic mesenteric ischemia.   Constipation: no improvement with fiber or miralax historically. Will trial Linzess 72 mcg once daily.     PLAN    Linzess 72 mcg once daily Continue PPI BID Progress report on Linzess Return in 2 months Requesting outside labs from [facility]   Delman Ferns, PhD, [facility]

## 2023-06-14 DIAGNOSIS — D649 Anemia, unspecified: Secondary | ICD-10-CM | POA: Diagnosis not present

## 2023-06-14 DIAGNOSIS — N179 Acute kidney failure, unspecified: Secondary | ICD-10-CM | POA: Diagnosis not present

## 2023-06-14 DIAGNOSIS — E785 Hyperlipidemia, unspecified: Secondary | ICD-10-CM | POA: Diagnosis not present

## 2023-06-14 DIAGNOSIS — I10 Essential (primary) hypertension: Secondary | ICD-10-CM | POA: Diagnosis not present

## 2023-06-20 DIAGNOSIS — E1122 Type 2 diabetes mellitus with diabetic chronic kidney disease: Secondary | ICD-10-CM | POA: Diagnosis not present

## 2023-06-20 DIAGNOSIS — N182 Chronic kidney disease, stage 2 (mild): Secondary | ICD-10-CM | POA: Diagnosis not present

## 2023-06-20 DIAGNOSIS — R809 Proteinuria, unspecified: Secondary | ICD-10-CM | POA: Diagnosis not present

## 2023-07-03 DIAGNOSIS — E1165 Type 2 diabetes mellitus with hyperglycemia: Secondary | ICD-10-CM | POA: Diagnosis not present

## 2023-07-03 DIAGNOSIS — K219 Gastro-esophageal reflux disease without esophagitis: Secondary | ICD-10-CM | POA: Diagnosis not present

## 2023-07-03 DIAGNOSIS — I10 Essential (primary) hypertension: Secondary | ICD-10-CM | POA: Diagnosis not present

## 2023-08-03 DIAGNOSIS — K219 Gastro-esophageal reflux disease without esophagitis: Secondary | ICD-10-CM | POA: Diagnosis not present

## 2023-08-03 DIAGNOSIS — E1165 Type 2 diabetes mellitus with hyperglycemia: Secondary | ICD-10-CM | POA: Diagnosis not present

## 2023-08-03 DIAGNOSIS — I10 Essential (primary) hypertension: Secondary | ICD-10-CM | POA: Diagnosis not present

## 2023-08-07 DIAGNOSIS — E785 Hyperlipidemia, unspecified: Secondary | ICD-10-CM | POA: Diagnosis not present

## 2023-08-07 DIAGNOSIS — D649 Anemia, unspecified: Secondary | ICD-10-CM | POA: Diagnosis not present

## 2023-08-07 DIAGNOSIS — I10 Essential (primary) hypertension: Secondary | ICD-10-CM | POA: Diagnosis not present

## 2023-08-07 DIAGNOSIS — E876 Hypokalemia: Secondary | ICD-10-CM | POA: Diagnosis not present

## 2023-08-07 DIAGNOSIS — E1165 Type 2 diabetes mellitus with hyperglycemia: Secondary | ICD-10-CM | POA: Diagnosis not present

## 2023-08-08 ENCOUNTER — Ambulatory Visit: Admitting: Gastroenterology

## 2023-08-14 DIAGNOSIS — J019 Acute sinusitis, unspecified: Secondary | ICD-10-CM | POA: Diagnosis not present

## 2023-08-14 DIAGNOSIS — J40 Bronchitis, not specified as acute or chronic: Secondary | ICD-10-CM | POA: Diagnosis not present

## 2023-08-14 DIAGNOSIS — I25708 Atherosclerosis of coronary artery bypass graft(s), unspecified, with other forms of angina pectoris: Secondary | ICD-10-CM | POA: Diagnosis not present

## 2023-08-14 DIAGNOSIS — E785 Hyperlipidemia, unspecified: Secondary | ICD-10-CM | POA: Diagnosis not present

## 2023-08-17 ENCOUNTER — Ambulatory Visit: Payer: Self-pay | Admitting: Gastroenterology

## 2023-08-22 ENCOUNTER — Telehealth: Payer: Self-pay

## 2023-08-22 ENCOUNTER — Telehealth: Payer: Self-pay | Admitting: *Deleted

## 2023-08-22 ENCOUNTER — Ambulatory Visit: Payer: Self-pay | Admitting: Gastroenterology

## 2023-08-22 VITALS — BP 142/62 | HR 59 | Temp 98.6°F | Ht 66.0 in | Wt 137.0 lb

## 2023-08-22 DIAGNOSIS — D649 Anemia, unspecified: Secondary | ICD-10-CM | POA: Diagnosis not present

## 2023-08-22 DIAGNOSIS — R634 Abnormal weight loss: Secondary | ICD-10-CM

## 2023-08-22 DIAGNOSIS — R1011 Right upper quadrant pain: Secondary | ICD-10-CM | POA: Insufficient documentation

## 2023-08-22 DIAGNOSIS — R7989 Other specified abnormal findings of blood chemistry: Secondary | ICD-10-CM | POA: Insufficient documentation

## 2023-08-22 NOTE — Telephone Encounter (Signed)
 Labs are scanned to the pt's chart

## 2023-08-22 NOTE — Patient Instructions (Signed)
 We are arranging an ultrasound of your gallbladder and liver.  We will recheck labs in November as well.   I am glad your weight is stable!  We will see you in 6 months, but we will make this sooner if anything changes with the ultrasound or lab findings!  I enjoyed seeing you again today! I value our relationship and want to provide genuine, compassionate, and quality care. You may receive a survey regarding your visit with me, and I welcome your feedback! Thanks so much for taking the time to complete this. I look forward to seeing you again.      Therisa MICAEL Stager, PhD, ANP-BC [facility] Gastroenterology

## 2023-08-22 NOTE — Telephone Encounter (Signed)
 LMOVM to call back to give US  appt details 8/25, arrival 9:15am, npo midnight

## 2023-08-22 NOTE — Telephone Encounter (Signed)
 Daughter aware of appt details.

## 2023-08-22 NOTE — Progress Notes (Signed)
 Gastroenterology Office Note     Primary Care Physician:  [facility]  Primary Gastroenterologist: Dr. Cindie   Chief Complaint   Chief Complaint  Patient presents with   Follow-up    Follow up on GERD. Weight loss and anemia     History of Present Illness   [name] is an 89 y.o. male presenting today with a history of GERD, dysphagia, weight loss, anemia, mildly drifting Hgb but normal iron studies, constipation,  undergoing colonoscopy and EGD along with CT as noted below. He was last seen in June 2025 and started on Linzess 72 mcg daily for constipation.   BM daily. Not difficult straining. Stool is not hard. Not taking supplemental fiber. Not taking Miralax. Linzess loose stool at first. He completed samples and no longer taking. No longer needs.   No N/V. Stomach feels bloated. Denies any issues with dysphagia. Has had a rattle in throat and sometimes clearing. Went to PCP on the 11th and given antibiotic.   Had acute pain in RUQ out of the blue that wrapped around his back on Sunday, lasted all day and then resolved on own. No constipation or diarrhea. No fever or chills with this. Does not recall any changes in activity prior to this. Push mows at home in [place], active at home. Push mowed on Saturday.   08/07/23: Hgb 13.5, platelets 159, Creatinine 1.22, AST 22, ALT 38, alk phos 48, tbili 0.4 A1c 8.0. not fasting.   June 2025 Hgb 11.5, Hct 31, platelets 164,   Weight loss: in 2020 was in the mid 140s, 2021 high 130s, 2022 mid 120s-130s. 130 11/30/21. In Dec 2023 was 128. Feb 2024 was 120. April 2024 was 128. July 2024 was 129. Jan 2025 134. June 2025 137. Today 137.    March 2025 EGD with mild schatzki ring s/p dilation, normal stomach and duodenum.     EGD/dilation July 2023 and found to have non-severe candida esophagitis, mild Schatzki ring s/p dilation, normal stomach and duodenal bulb, retained food in duodenum    Colonoscopy May 2024:  diverticulosis.\   CT abd/pelvis without contrast Jan 2024 1. A specific cause for the patient's left flank pain is not  identified. No urinary tract calculi are currently identified.  2. Sigmoid colon diverticulosis.  3. Mild lumbar spondylosis and degenerative disc disease.  4. Aortic atherosclerosis. Prior CABG.    Past Medical History:  Diagnosis Date   Arthritis    Asthma    Asymptomatic stenosis of left vertebral artery 2002, 2005   Status post PTA/stent with redo; normal antegrade flow on dopplers 09/2013   CAD (coronary artery disease) 1995   1CABG x3; redo in CABG x 1 2001 Free Radial to LAD; All grafts patent by Cath 08/2014: The proximal to mid LAD has poor retrograde filling from the LIMA due to in-stent restenosis. 50% ostial disease of free radial artery to the LAD.   CAD (coronary artery disease) of artery bypass graft 2000   PCI x 2 - ostial & prox-mid LAD (BMS) for atretic LIMA-LAD   CAD in native artery 1995   CABG x 3 - LIMA-LAD, SVG-OM2, SVG-rPDA   Chest pain 09/2017   GERD (gastroesophageal reflux disease)    Glaucoma    Hyperlipidemia    Hypertension, essential    Neuropathy    feet   S/P CABG x 3 1995   S/P Redo CABG x 1 2001   L Radial-LAD after 2 failed attempts @  LIMA-LAD PTCA; LAD stents 100% occluded   Stenosis of left subclavian artery (HCC) 11/2003   Status post PTA/stent --> < 50 % stenosis by Dopplers 09/2013   Type 2 diabetes mellitus (HCC) 2002    Past Surgical History:  Procedure Laterality Date   BALLOON DILATION N/A 07/07/2021   Procedure: BALLOON DILATION;  Surgeon: Cindie Carlin POUR, DO;  [facility];  Service: Endoscopy;  Laterality: N/A;   BALLOON DILATION N/A 03/27/2023   Procedure: BALLOON DILATION;  Surgeon: Cindie Carlin POUR, DO;  [facility]: [facility];  Service: Endoscopy;  Laterality: N/A;  7:30 AM, ASA 3   CARDIAC CATHETERIZATION N/A 08/04/2014   Procedure: Left Heart Cath and Cors/Grafts Angiography;  Surgeon:  Alm LELON Clay, MD;  Location: [facility] INVASIVE CV LAB;  Service: Cardiovascular: o-mRCA 70%, m-dRCA ~70% - SVG-dRCA Patent.  o-pLAD 100% stent occluded, mLAD 95% ISR with poor retrograde filling from freeRadiial graft-LAD (50% ostial graft dz).  o-p Cx 80%. Widely patent SVG-Cx-OM fills retrograde to 80% lesion.; Normal LV Fxn & EDP   COLONOSCOPY WITH PROPOFOL  N/A 05/31/2022   Procedure: COLONOSCOPY WITH PROPOFOL ;  Surgeon: Cindie Carlin POUR, DO;  [facility];  Service: Endoscopy;  Laterality: N/A;  7:30am; asa 3   CORONARY ANGIOPLASTY  April and May 2001   After Both LAD stents occluded - PTCA of anastomatic LIMA-LAD lesion -- Unsuccessful.   CORONARY ARTERY BYPASS GRAFT  1995   LIMA-LAD, SVG-OM2, SVG-rPDA   CORONARY ARTERY BYPASS GRAFT  06/1999   Dr. Fleeta Trigt: Redo LAD grafting with free Left Radial-distal LAD   CORONARY STENT PLACEMENT  1995-2000   2 BMS stents to osital-proximal & proximal-mid LAD; because of atretic LIMA-LDA   ESOPHAGEAL BRUSHING  07/07/2021   Procedure: ESOPHAGEAL BRUSHING;  Surgeon: Cindie Carlin POUR, DO;  [facility];  Service: Endoscopy;;   ESOPHAGOGASTRODUODENOSCOPY (EGD) WITH PROPOFOL  N/A 07/07/2021   non-severe candida esophagitis, mild Schatzki ring s/p dilation, normal stomach and duodenal bulb, retained food in duodenum.   ESOPHAGOGASTRODUODENOSCOPY (EGD) WITH PROPOFOL  N/A 03/27/2023   Procedure: ESOPHAGOGASTRODUODENOSCOPY (EGD) WITH PROPOFOL ;  Surgeon: Cindie Carlin POUR, DO;  [facility];  Service: Endoscopy;  Laterality: N/A;  7:30 AM, ASA 3   EYE SURGERY Bilateral    LEFT HEART CATH AND CORONARY ANGIOGRAPHY N/A 09/18/2017   Procedure: LEFT HEART CATH AND CORONARY ANGIOGRAPHY;  Surgeon: Swaziland, Peter M, MD;  Location: [facility] INVASIVE CV LAB;  Service: Cardiovascular;  Laterality: N/A;   LEFT HEART CATHETERIZATION WITH CORONARY/GRAFT ANGIOGRAM N/A 08/22/2011   Procedure: LEFT HEART CATHETERIZATION WITH EL BILE;   Surgeon: Alm LELON Clay, MD;  Location: [facility] CATH LAB;  Service: Cardiovascular:  Known occluded LIMA-LAD and ostial LAD. Moderate to severe proximal circumflex and RCA disease. Widely patent freeLRAD-dLAD, as well as SVG-RPDA (backfilling RPL), SVG-OM 2 (backfilling OM1)   SHOULDER OPEN ROTATOR CUFF REPAIR  10/2002   Dr. Jane   [facility] VERT OR THOR CAROTID STENT Left October 2002; November 2005   Left Vertebral stent placed in October 2002 (Dr. Monna); redo PCI in 2005   SUBCLAVIAN ARTERY STENT Left 11/2003   Dr. Court   TRANSTHORACIC ECHOCARDIOGRAM  04/10/2020   [facility]): Normal LV size and function.  EF 60 to 65%.  GR 1 DD.  Mild AI.  Mild to moderate LA dilation.  GR 1 DD.SABRA   TRANSTHORACIC ECHOCARDIOGRAM  02/2021   EF 70 to 75%.  Hyperdynamic.  No RWMA.  GR 1 DD.  Normal  RV, RVP and low normal RAP.  Normal valves.    Current Outpatient Medications  Medication Sig Dispense Refill   acetaminophen  (TYLENOL ) 500 MG tablet Take 1,000 mg by mouth every 6 (six) hours as needed for mild pain or headache.     albuterol  (VENTOLIN  HFA) 108 (90 Base) MCG/ACT inhaler Inhale 2 puffs into the lungs every 6 (six) hours as needed for wheezing or shortness of breath.     Brinzolamide -Brimonidine  1-0.2 % SUSP Place 1 drop into the right eye 3 (three) times daily.     Cholecalciferol  (VITAMIN D ) 2000 units tablet Take 2,000 Units by mouth daily with breakfast.     clopidogrel  (PLAVIX ) 75 MG tablet Take 75 mg by mouth daily.     diclofenac Sodium (VOLTAREN) 1 % GEL Apply 2 g topically daily as needed (knee pain).     doxazosin  (CARDURA ) 2 MG tablet Take 2 mg by mouth daily.     doxazosin  (CARDURA ) 8 MG tablet Take 1 tablet (8 mg total) by mouth at bedtime. 30 tablet 1   ferrous sulfate (FEROSUL) 325 (65 FE) MG tablet Take 325 mg by mouth daily with breakfast.     finasteride  (PROSCAR ) 5 MG tablet Take 5 mg by mouth daily.     gabapentin  (NEURONTIN ) 300 MG capsule Take 900 mg by mouth 3  (three) times daily.     HUMALOG KWIKPEN 100 UNIT/ML KwikPen Inject 8 Units into the skin See admin instructions. Inject 8 units subcutaneously prior to breakfast, 8 units prior to lunch and 8 units prior to dinner     Insulin  Glargine-Lixisenatide 100-33 UNT-MCG/ML SOPN Inject 26 Units into the skin daily before breakfast. Soliqua Solostar     isosorbide  mononitrate (IMDUR ) 30 MG 24 hr tablet Take 1 tablet (30 mg total) by mouth daily. 90 tablet 3   nitroGLYCERIN  (NITROSTAT ) 0.4 MG SL tablet DISSOLVE 1 TABLET UNDER THE TONGUE EVERY 5 MINUTES AS NEEDED FOR CHEST PAIN. DO NOT EXCEED A TOTAL OF 3 DOSES IN 15 MINUTES. 25 tablet 8   Omega-3 Fatty Acids (FISH OIL) 1000 MG CAPS Take 1,000 mg by mouth 2 (two) times daily with a meal.      pantoprazole  (PROTONIX ) 40 MG tablet TAKE 1 TABLET BY MOUTH TWICE DAILY BEFORE A MEAL 60 tablet 3   prednisoLONE  acetate (PRED FORTE ) 1 % ophthalmic suspension Place 1 drop into the left eye daily.     pyridoxine (B-6) 100 MG tablet Take 100 mg by mouth daily.     rosuvastatin  (CRESTOR ) 40 MG tablet TAKE 1 TABLET BY MOUTH DAILY 90 tablet 2   thiamine 250 MG tablet Take 250 mg by mouth daily.     vitamin C  (ASCORBIC ACID ) 500 MG tablet Take 500 mg by mouth 2 (two) times daily with a meal.      No current facility-administered medications for this visit.    Allergies as of 08/22/2023 - Review Complete 08/22/2023  Allergen Reaction Noted   Iohexol  Other (See Comments) 11/03/2002   Contrast media [iodinated contrast media]  11/09/2012   Metformin  and related Diarrhea 11/11/2012    Family History  Problem Relation Age of Onset   Diabetes Mother    Diabetes Sister    Diabetes Brother     Social History   Socioeconomic History   Marital status: Married    Spouse name: Not on file   Number of children: Not on file   Years of education: Not on file   Highest education level: Not  on file  Occupational History   Not on file  Tobacco Use   Smoking status:  Former    Current packs/day: 3.00    Average packs/day: 3.0 packs/day for 30.0 years (90.0 ttl pk-yrs)    Types: Cigarettes   Smokeless tobacco: Never   Tobacco comments:    quit smoking about 50 years ago  Vaping Use   Vaping status: Never Used  Substance and Sexual Activity   Alcohol use: No   Drug use: No   Sexual activity: Not Currently  Other Topics Concern   Not on file  Social History Narrative   He is married with one daughter. He has 2 grandchildren.   He does not smoke. He quit smoking in roughly 1970 after smoking 3 packs per day.   He is routinely at give him. It does not necessarily do routine exercise.    Social Drivers of Corporate investment banker Strain: Not on file  Food Insecurity: No Food Insecurity (10/21/2021)   Hunger Vital Sign    Worried About Running Out of Food in the Last Year: Never true    Ran Out of Food in the Last Year: Never true  Transportation Needs: No Transportation Needs (10/21/2021)   PRAPARE - Administrator, Civil Service (Medical): No    Lack of Transportation (Non-Medical): No  Physical Activity: Not on file  Stress: Not on file  Social Connections: Unknown (08/03/2022)   [facility]   Social Network    Social Network: Not on file  Intimate Partner Violence: Unknown (08/03/2022)   Received from [facility]   HITS    Physically Hurt: Not on file    Insult or Talk Down To: Not on file    Threaten Physical Harm: Not on file    Scream or Curse: Not on file     Review of Systems   Gen: Denies any fever, chills, fatigue, weight loss, lack of appetite.  CV: Denies chest pain, heart palpitations, peripheral edema, syncope.  Resp: Denies shortness of breath at rest or with exertion. Denies wheezing or cough.  GI: Denies dysphagia or odynophagia. Denies jaundice, hematemesis, fecal incontinence. GU : Denies urinary burning, urinary frequency, urinary hesitancy MS: Denies joint pain, muscle weakness,  cramps, or limitation of movement.  Derm: Denies rash, itching, dry skin Psych: Denies depression, anxiety, memory loss, and confusion Heme: Denies bruising, bleeding, and enlarged lymph nodes.   Physical Exam   BP (!) 142/62   Pulse (!) 59   Temp 98.6 F (37 C)   Ht 5' 6 (1.676 m)   Wt 137 lb (62.1 kg)   BMI 22.11 kg/m  General:   Alert and oriented. Pleasant and cooperative. Well-nourished and well-developed.  Head:  Normocephalic and atraumatic. Eyes:  Without icterus Abdomen:  +BS, soft, TTP RUQ and non-distended. No HSM noted. No guarding or rebound. No masses appreciated.  Rectal:  Deferred  Msk:  Symmetrical without gross deformities. Normal posture. Extremities:  Without edema. Neurologic:  Alert and  oriented x4;  grossly normal neurologically. Skin:  Intact without significant lesions or rashes. Psych:  Alert and cooperative. Normal mood and affect.   Assessment   [name] is a 89 y.o. male presenting today with a history of GERD, dysphagia, weight loss, anemia, mildly drifting Hgb but normal iron studies, constipation,  undergoing colonoscopy and EGD along with CT as noted above. He was last seen in June 2025, now with close follow-up for hx  of weight loss, constipation, anemia.  Weight loss: this has plateaued and been stable since Jan 2025.  No concerning signs for chronic mesenteric ischemia.  Colonoscopy and EGD on file as noted above.  Constipation has resolved.  He no longer requires any prescription agents.  Now noting new symptoms of right upper quadrant abdominal pain concerning for biliary origin.  He was notably tender in right upper quadrant on exam today.  Recent LFTs with AST 22 ALT 38 alk phos 48 T. bili 0.4.  Will pursue right upper quadrant ultrasound to assess for gallstones.  History of anemia mainly driven by chronic disease, possible functional iron deficiency component.  He is actually taking oral iron daily.  Colonoscopy on file from  last year and upper endoscopy on 2 separate occasions most recently in March 2025.  As he is stable right now we will hold off on any capsule study at the moment unless he has any repeat  drop in hemoglobin.  Dysphagia has resolved status post dilation earlier this year.   Mildly elevated ALT noted on recent labs.  We are arranging an ultrasound in the near future and we can recheck labs in November.   PLAN   US  abdomen complete Repeat HFP in November with nephrology labs May need updated labs depending on findings from US  Continue PPI   Therisa MICAEL Stager, PhD, [facility]

## 2023-08-23 ENCOUNTER — Other Ambulatory Visit: Payer: Self-pay

## 2023-08-23 DIAGNOSIS — R1310 Dysphagia, unspecified: Secondary | ICD-10-CM

## 2023-08-23 DIAGNOSIS — R7989 Other specified abnormal findings of blood chemistry: Secondary | ICD-10-CM

## 2023-08-23 DIAGNOSIS — K219 Gastro-esophageal reflux disease without esophagitis: Secondary | ICD-10-CM

## 2023-08-28 ENCOUNTER — Ambulatory Visit (HOSPITAL_COMMUNITY)
Admission: RE | Admit: 2023-08-28 | Discharge: 2023-08-28 | Disposition: A | Discharge status: Home / Self Care | Source: Ambulatory Visit | Attending: Gastroenterology | Admitting: Gastroenterology

## 2023-08-28 DIAGNOSIS — R1011 Right upper quadrant pain: Secondary | ICD-10-CM | POA: Insufficient documentation

## 2023-08-28 DIAGNOSIS — R7989 Other specified abnormal findings of blood chemistry: Secondary | ICD-10-CM | POA: Diagnosis not present

## 2023-08-28 DIAGNOSIS — N281 Cyst of kidney, acquired: Secondary | ICD-10-CM | POA: Diagnosis not present

## 2023-08-29 DIAGNOSIS — E1169 Type 2 diabetes mellitus with other specified complication: Secondary | ICD-10-CM | POA: Diagnosis not present

## 2023-08-29 DIAGNOSIS — E1140 Type 2 diabetes mellitus with diabetic neuropathy, unspecified: Secondary | ICD-10-CM | POA: Diagnosis not present

## 2023-08-29 DIAGNOSIS — E11319 Type 2 diabetes mellitus with unspecified diabetic retinopathy without macular edema: Secondary | ICD-10-CM | POA: Diagnosis not present

## 2023-08-29 DIAGNOSIS — R809 Proteinuria, unspecified: Secondary | ICD-10-CM | POA: Diagnosis not present

## 2023-08-29 DIAGNOSIS — E1129 Type 2 diabetes mellitus with other diabetic kidney complication: Secondary | ICD-10-CM | POA: Diagnosis not present

## 2023-08-29 DIAGNOSIS — Z794 Long term (current) use of insulin: Secondary | ICD-10-CM | POA: Diagnosis not present

## 2023-08-29 DIAGNOSIS — E785 Hyperlipidemia, unspecified: Secondary | ICD-10-CM | POA: Diagnosis not present

## 2023-09-01 DIAGNOSIS — E1165 Type 2 diabetes mellitus with hyperglycemia: Secondary | ICD-10-CM | POA: Diagnosis not present

## 2023-09-01 DIAGNOSIS — K219 Gastro-esophageal reflux disease without esophagitis: Secondary | ICD-10-CM | POA: Diagnosis not present

## 2023-09-01 DIAGNOSIS — I10 Essential (primary) hypertension: Secondary | ICD-10-CM | POA: Diagnosis not present

## 2023-09-11 ENCOUNTER — Telehealth: Payer: Self-pay

## 2023-09-11 NOTE — Telephone Encounter (Signed)
 Pt's daughter Janese Stager) phoned to find out results of the pt's US  Abd shows. Please advise

## 2023-09-13 NOTE — Telephone Encounter (Signed)
 Returned the pt's / daughter phone call advised that I had sent you a message Monday but you were out and I will send you another note regarding results were needed for US  Abd. Please call or advise

## 2023-09-14 ENCOUNTER — Ambulatory Visit: Payer: Self-pay | Admitting: Gastroenterology

## 2023-09-14 NOTE — Telephone Encounter (Signed)
Please see result note.  Thanks.

## 2023-09-15 NOTE — Telephone Encounter (Signed)
 Noted  Pt / daughter notified

## 2023-09-18 ENCOUNTER — Other Ambulatory Visit: Payer: Self-pay | Admitting: *Deleted

## 2023-09-20 NOTE — Telephone Encounter (Signed)
 Pt's wife left vm wanting to know about the further testing for pt. Spoke with pt's daughter and informed her of waiting to hear back from provider because pt has allergy to contrast. Advised her that provider is out of office the rest of the week and have to wait to get order from provider on what to do. She asks that the MRI be after 2 pm when scheduled.

## 2023-09-20 NOTE — Telephone Encounter (Signed)
 Pt's daughter Holli (on dpr) called back and stated that pt can not have an MRI because he has metal in his face, so something else needs to be done.

## 2023-10-03 DIAGNOSIS — E1165 Type 2 diabetes mellitus with hyperglycemia: Secondary | ICD-10-CM | POA: Diagnosis not present

## 2023-10-03 DIAGNOSIS — I10 Essential (primary) hypertension: Secondary | ICD-10-CM | POA: Diagnosis not present

## 2023-10-03 DIAGNOSIS — K219 Gastro-esophageal reflux disease without esophagitis: Secondary | ICD-10-CM | POA: Diagnosis not present

## 2023-10-16 ENCOUNTER — Other Ambulatory Visit: Payer: Self-pay | Admitting: *Deleted

## 2023-10-16 DIAGNOSIS — N281 Cyst of kidney, acquired: Secondary | ICD-10-CM

## 2023-10-19 MED ORDER — PREDNISONE 50 MG PO TABS: ORAL_TABLET | ORAL | 0 refills | Status: AC

## 2023-10-19 NOTE — Addendum Note (Signed)
 Addended by: SHIRLEAN THERISA ORN on: 10/19/2023 04:15 PM   Modules accepted: Orders

## 2023-10-19 NOTE — Telephone Encounter (Signed)
 Tammy: I sent in Prednisone  50 mg. He will need to take this 13 hours before the scan, 7 hours before the scan, and 1 hour before the scan. He will also need to take benadryl  50 mg one hour before the scan (this will be over the counter).

## 2023-10-20 ENCOUNTER — Telehealth: Payer: Self-pay

## 2023-10-20 ENCOUNTER — Other Ambulatory Visit (HOSPITAL_COMMUNITY)

## 2023-10-20 NOTE — Telephone Encounter (Signed)
 Pt's daughter had phoned and LMOVM yesterday. Returned call to the pt's daughter Dorothe and LMOVM to return call.

## 2023-10-23 NOTE — Telephone Encounter (Signed)
 Attempted X 2 to return call to [name] pt's daughter no ans. Will try again later

## 2023-10-24 ENCOUNTER — Telehealth: Payer: Self-pay | Admitting: *Deleted

## 2023-10-24 NOTE — Telephone Encounter (Signed)
 LMOVM to return call  Pt's daughter Holli left vm regarding medication that pt needs to take prior to CT

## 2023-10-24 NOTE — Telephone Encounter (Signed)
 Spoke to pt's daughter Holli ( on dpr) gave instructions for medication prior to having CT done. Verbalized understanding.

## 2023-10-27 ENCOUNTER — Ambulatory Visit (HOSPITAL_COMMUNITY)
Admission: RE | Admit: 2023-10-27 | Discharge: 2023-10-27 | Disposition: A | Discharge status: Home / Self Care | Source: Ambulatory Visit | Attending: Gastroenterology | Admitting: Gastroenterology

## 2023-10-27 DIAGNOSIS — N281 Cyst of kidney, acquired: Secondary | ICD-10-CM | POA: Diagnosis not present

## 2023-10-27 DIAGNOSIS — K5730 Diverticulosis of large intestine without perforation or abscess without bleeding: Secondary | ICD-10-CM | POA: Diagnosis not present

## 2023-10-27 DIAGNOSIS — R1011 Right upper quadrant pain: Secondary | ICD-10-CM | POA: Diagnosis not present

## 2023-10-27 LAB — POCT I-STAT CREATININE: Creatinine, Ser: 1 mg/dL (ref 0.61–1.24)

## 2023-10-27 MED ORDER — IOHEXOL 300 MG/ML  SOLN
100.0000 mL | Freq: Once | INTRAMUSCULAR | Status: AC | PRN
  Administered 2023-10-27: 100 mL via INTRAVENOUS

## 2023-10-31 ENCOUNTER — Ambulatory Visit: Payer: Self-pay | Admitting: Gastroenterology

## 2023-11-09 LAB — HEPATIC FUNCTION PANEL
AG Ratio: 1.8 (calc) (ref 1.0–2.5)
ALT: 25 U/L (ref 9–46)
AST: 21 U/L (ref 10–35)
Albumin: 3.9 g/dL (ref 3.6–5.1)
Alkaline phosphatase (APISO): 51 U/L (ref 35–144)
Bilirubin, Direct: 0.1 mg/dL (ref 0.0–0.2)
Globulin: 2.2 g/dL (ref 1.9–3.7)
Indirect Bilirubin: 0.3 mg/dL (ref 0.2–1.2)
Total Bilirubin: 0.4 mg/dL (ref 0.2–1.2)
Total Protein: 6.1 g/dL (ref 6.1–8.1)

## 2023-11-10 ENCOUNTER — Telehealth: Payer: Self-pay | Admitting: Gastroenterology

## 2023-11-10 NOTE — Telephone Encounter (Signed)
 Patients daughter states she is calling back nurse to get the results for his test done recently.

## 2023-11-10 NOTE — Telephone Encounter (Signed)
 Pt's daughter phoned inquiring of the pt' labs he had done. Please advise

## 2023-11-14 NOTE — Telephone Encounter (Signed)
 See result note.

## 2023-11-14 NOTE — Telephone Encounter (Signed)
 Normal LFTs. Please see note in results.

## 2023-11-24 ENCOUNTER — Other Ambulatory Visit: Payer: Self-pay | Admitting: Cardiology

## 2023-12-06 ENCOUNTER — Ambulatory Visit: Attending: Cardiology | Admitting: Cardiology

## 2023-12-06 ENCOUNTER — Encounter: Payer: Self-pay | Admitting: Cardiology

## 2023-12-06 VITALS — BP 156/64 | HR 65 | Ht 66.0 in | Wt 140.8 lb

## 2023-12-06 DIAGNOSIS — R600 Localized edema: Secondary | ICD-10-CM

## 2023-12-06 DIAGNOSIS — E785 Hyperlipidemia, unspecified: Secondary | ICD-10-CM

## 2023-12-06 DIAGNOSIS — I10 Essential (primary) hypertension: Secondary | ICD-10-CM | POA: Diagnosis not present

## 2023-12-06 DIAGNOSIS — Z951 Presence of aortocoronary bypass graft: Secondary | ICD-10-CM | POA: Diagnosis not present

## 2023-12-06 DIAGNOSIS — I739 Peripheral vascular disease, unspecified: Secondary | ICD-10-CM | POA: Diagnosis not present

## 2023-12-06 DIAGNOSIS — I25119 Atherosclerotic heart disease of native coronary artery with unspecified angina pectoris: Secondary | ICD-10-CM | POA: Diagnosis not present

## 2023-12-06 DIAGNOSIS — E1169 Type 2 diabetes mellitus with other specified complication: Secondary | ICD-10-CM

## 2023-12-06 DIAGNOSIS — I951 Orthostatic hypotension: Secondary | ICD-10-CM

## 2023-12-06 MED ORDER — VALSARTAN-HYDROCHLOROTHIAZIDE 80-12.5 MG PO TABS: 0.5000 | ORAL_TABLET | Freq: Every day | ORAL | 3 refills | Status: AC

## 2023-12-06 NOTE — Patient Instructions (Addendum)
 Medication Instructions:    Stop taking  Amlodipine    Valsartan - HCTZ -- 80/12.5 mg   *If you need a refill on your cardiac medications before your next appointment, please call your pharmacy*   Lab Work: BMP in 2 weeks   If you have labs (blood work) drawn today and your tests are completely normal, you will receive your results only by: MyChart Message (if you have MyChart) OR A paper copy in the mail If you have any lab test that is abnormal or we need to change your treatment, we will call you to review the results.   Testing/Procedures: Not needed   Follow-Up: At [facility], you and your health needs are our priority.  As part of our continuing mission to provide you with exceptional heart care, we have created designated Provider Care Teams.  These Care Teams include your primary Cardiologist (physician) and Advanced Practice Providers (APPs -  Physician Assistants and Nurse Practitioners) who all work together to provide you with the care you need, when you need it.     Your next appointment:   2 month(s)  The format for your next appointment:   In Person  Provider:   Damien Braver, NP or Katlyn West, NP      Then, Alm Clay, MD will plan to see you again in 12 month(s).

## 2023-12-06 NOTE — Progress Notes (Signed)
 Cardiology Office Note:  .   Date:  12/11/2023  ID:  [name], DOB 12-Jan-1932, MRN [id number] PCP: [facility]  [facility] Providers Cardiologist:  Alm Clay, MD     Chief Complaint  Patient presents with   Follow-up    Annual follow-up.  Noting edema and dyspnea   Coronary Artery Disease    No real angina, has his chronic MSK chest pain   Hypertension    BP elevated    Patient Profile: .     [name] is a 89 y.o. male with a PMH notable who presents here for annual follow-up with complaints of with complaints of lower extremity edema and some dyspnea.  At the request of [facility]  PMH CAD-CABG (1995 with redo in 2001 - L Rad-dLAD after occlusion of LAD stent & atretic LIMA is not),  Chronic stable angina and MSK pain since surgery,  HTN, HLD and DM-2 *.      [name] was last seen on November 28, 2022  Subjective  Discussed the use of AI scribe software for clinical note transcription with the patient, who gave verbal consent to proceed.  History of Present Illness [name] is an 89 year old male with hypertension and diabetes who presents with leg swelling.  He has been experiencing swelling in his legs for over a month. The swelling does not decrease with elevation and persists upon waking in the morning. Compression socks have been used, which help manage the swelling. No shortness of breath when lying flat or waking up short of breath. No heart palpitations, dizziness, or lightheadedness unrelated to blood sugar levels.  He has a history of hypertension and was recently restarted on amlodipine  2.5 mg by his family doctor on November 28th. He has not noticed a worsening of swelling since starting this medication. He is also on doxazosin  2 mg for blood pressure management.  He has diabetes, with a recent hemoglobin A1c of 8.1%. He experiences dizziness when his blood sugar is low. Recent lab work from  November 20th shows a hemoglobin of 12.1, glucose of 166, and normal kidney function with a creatinine of 0.9. His cholesterol levels were checked in April, showing total cholesterol of 84, triglycerides of 106, HDL of 31, and LDL of 31.8.  Cardiovascular ROS: positive for - chest pain, dyspnea on exertion, edema, and chest pain this is standard musculoskeletal chest pain negative for - irregular heartbeat, orthopnea, palpitations, paroxysmal nocturnal dyspnea, rapid heart rate, shortness of breath, or syncope or near syncope, TIA or amaurosis fugax.  Claudication.  Melena, Hematochezia, hematuria or epistaxis.  ROS:  Review of Systems - Negative except symptoms noted above.    Objective   Medications: BP: Amlodipine  2.5 mg daily, doxazosin  2 mg daily, Lipids: Fish oil 1000 mg twice daily, rosuvastatin  40 mg daily CAD: Clopidogrel  75 mg; Imdur  30 mg daily; as needed nitroglycerin  DM-2: Lispro insulin , glargine insulin , Tresiba  insulin  GU: In addition to Cardura , also finasteride  5 mg daily Pyridoxine, thiamine, vitamin D , vitamin C   Studies Reviewed: SABRA   EKG Interpretation Date/Time:  Wednesday December 06 2023 14:59:59 EST Ventricular Rate:  65 PR Interval:  158 QRS Duration:  92 QT Interval:  388 QTC Calculation: 403 R Axis:   2  Text Interpretation: Normal sinus rhythm Normal ECG When compared with ECG of 26-Jul-2022 15:06, No significant change since last tracing Confirmed by Clay Alm ([id number]) on 12/06/2023 3:21:12 PM   Lab Results  Component Value Date   CHOL 108 07/21/2021   HDL 36 07/21/2021   LDLCALC 50 07/21/2021   TRIG 125 07/21/2021   CHOLHDL 4.5 09/18/2017    Results LABS Hemoglobin: 12.1 (11/22/2023) Glucose: 166 (11/22/2023) A1c: 8.1 (11/22/2023) Total Cholesterol: 84 (04/2023) Triglycerides: 106 (04/2023) HDL: 31 (04/2023) LDL: 31.8 (04/2023) TSH: 3.72 (04/2023) Creatinine: 0.9 (11/2023)  Prior Studies Cardiac Catheterization: No stents placed  (09/2017). 1. Severe LM & 3V Obstructive CAD (100% LAD, 90& RCA & LCx); 2. Patent frLRad-dLAD; Patent SVG-OM1; 4. Patent SVG-d RCA; 5. Normal LV function; 6. Normal LVEDP  Echocardiogram (10/21/2021): LVEF 60 to 65%.  No RWMA.  Normal diastolic parameters.  Normal RV.  Mild LA dilation.  Mild MR.  Tricuspid aortic valve.  Normal RAP and RVP.  Risk Assessment/Calculations:          Physical Exam:   VS:  BP (!) 156/64 (BP Location: Right Arm, Patient Position: Sitting, Cuff Size: Normal)   Pulse 65   Ht 5' 6 (1.676 m)   Wt 140 lb 12.8 oz (63.9 kg)   SpO2 96%   BMI 22.73 kg/m    Wt Readings from Last 3 Encounters:  12/06/23 140 lb 12.8 oz (63.9 kg)  08/22/23 137 lb (62.1 kg)  06/06/23 137 lb 3.2 oz (62.2 kg)    GEN: Well nourished, well groomed in no acute distress; relatively healthy appearing for stated age. NECK: No JVD; No carotid bruits CARDIAC: Normal S1, S2; RRR, no murmurs, rubs, gallops; normal MSK chest pain on the left side of her chest. RESPIRATORY:  Clear to auscultation without rales, wheezing or rhonchi ; nonlabored, good air movement. ABDOMEN: Soft, non-tender, non-distended EXTREMITIES:  No edema; No deformity     ASSESSMENT AND PLAN: .    Problem List Items Addressed This Visit       Cardiology Problems   CAD -> CABG x3 then Re-DO CABG x1 (LRad-dLAD) after atretic LIMA & LAD stent occlusion. (Chronic)   Chronic stable angina mostly complicated by MSK pain.  No true angina symptoms. Last heart catheterization 2019-stable with patent grafts. Last echo was in October 2023 essentially normal. Continue rosuvastatin  for lipid management along with Imdur  30 mL daily. Continue Plavix  75 mg daily, okay to hold 5 to 7 days preop for surgeries or procedures Will convert from amlodipine  to valsartan -HCTZ (1/2 tablet of 80-12.5 milligram tablet) for better blood pressure control and less likely edema      Relevant Medications   valsartan -hydrochlorothiazide  (DIOVAN -HCT)  80-12.5 MG tablet   Essential hypertension, benign (Chronic)   Blood pressure elevated. Current regimen ineffective. Amlodipine  may contribute to edema. Plan to switch considering diabetes and kidney function. - Discontinued amlodipine . - Initiated valsartan -HCTZ 80-12.5 mg 1/2 tablet daily - Continue Imdur  30 mg daily - Monitor blood pressure and adjust treatment.      Relevant Medications   valsartan -hydrochlorothiazide  (DIOVAN -HCT) 80-12.5 MG tablet   Hyperlipidemia associated with type 2 diabetes mellitus (HCC) (Chronic)   Diabetes management ongoing.  (See med list) glucose 166 mg/dL, J8r 1.8%.  LDL 31 on recent check.  Well-controlled on current dose of 40 mg rosuvastatin .  Continue current dose      Relevant Medications   TRESIBA  FLEXTOUCH 100 UNIT/ML FlexTouch Pen   valsartan -hydrochlorothiazide  (DIOVAN -HCT) 80-12.5 MG tablet   Orthostatic hypotension (Chronic)   Has had a history of orthostatic hypotension but is hypertensive today.  Encourage adequate hydration.  I worry with the use of Cardura  and Proscar  that this could be contributing.  Judicious use of the HCTZ component of valsartan -HCTZ      Relevant Medications   valsartan -hydrochlorothiazide  (DIOVAN -HCT) 80-12.5 MG tablet     Other   Bilateral lower extremity edema - Primary   Lower extremity edema in the setting of peripheral vascular disease and neuropathy Chronic edema likely worsened by amlodipine . No signs of heart failure. Neuropathy may impair venous return. - Discontinued amlodipine . - Initiated valsartan  or irbesartan with a diuretic. - Advised wearing compression socks. - Recommended elevating feet.      S/P CABG (coronary artery bypass graft): Initial CABGX3 1995 (LIMA-LAD, SVG-OM 2, SVG-RPDA) --> redo CABG x1 FreeLRad-LAD for a occluded LAD with atretic LIMA and failed attempted revascularization. (Chronic)   Most recent In 2019 shows patent grafts of redo CABG.  Unless he has significantly  worsening symptoms, we will hold off on any further ischemic evaluation.      Other Visit Diagnoses       Essential hypertension       Relevant Medications   valsartan -hydrochlorothiazide  (DIOVAN -HCT) 80-12.5 MG tablet   Other Relevant Orders   EKG 12-Lead (Completed)   Basic metabolic panel with GFR       Lipoma, right upper extremity Palpable mass likely a lipoma. Differential includes lymph node, but characteristics suggest lipoma.       Follow-Up: Return in about 2 months (around 02/06/2024) for Follow-up with APP, 1 Yr Follow-up, Routine follow up with me.     Signed, Alm MICAEL Clay, MD, MS Alm Clay, M.D., M.S. Interventional Cardiologist  [facility] Pager # [phone]

## 2023-12-07 ENCOUNTER — Telehealth: Payer: Self-pay

## 2023-12-07 NOTE — Telephone Encounter (Signed)
 Pt's daughter phoned that the pt needs a new Rx for Pantoprazole  sent to [facility]

## 2023-12-08 NOTE — Telephone Encounter (Signed)
 Pt's daughter's phone again stating today that [facility] needs a new Rx for Pantoprazole  sent to [facility]. Please advise

## 2023-12-10 MED ORDER — PANTOPRAZOLE SODIUM 40 MG PO TBEC: 40.0000 mg | DELAYED_RELEASE_TABLET | Freq: Two times a day (BID) | ORAL | 3 refills | Status: AC

## 2023-12-10 NOTE — Addendum Note (Signed)
 Addended by: SHIRLEAN THERISA ORN on: 12/10/2023 11:23 PM   Modules accepted: Orders

## 2023-12-10 NOTE — Telephone Encounter (Signed)
 Completed.

## 2023-12-11 ENCOUNTER — Encounter: Payer: Self-pay | Admitting: Cardiology

## 2023-12-11 DIAGNOSIS — R600 Localized edema: Secondary | ICD-10-CM | POA: Insufficient documentation

## 2023-12-11 NOTE — Assessment & Plan Note (Addendum)
 Chronic stable angina mostly complicated by MSK pain.  No true angina symptoms. Last heart catheterization 2019-stable with patent grafts. Last echo was in October 2023 essentially normal. Continue rosuvastatin  for lipid management along with Imdur  30 mL daily. Continue Plavix  75 mg daily, okay to hold 5 to 7 days preop for surgeries or procedures Will convert from amlodipine  to valsartan -HCTZ (1/2 tablet of 80-12.5 milligram tablet) for better blood pressure control and less likely edema

## 2023-12-11 NOTE — Assessment & Plan Note (Signed)
 Most recent In 2019 shows patent grafts of redo CABG.  Unless he has significantly worsening symptoms, we will hold off on any further ischemic evaluation.

## 2023-12-11 NOTE — Assessment & Plan Note (Signed)
 Diabetes management ongoing.  (See med list) glucose 166 mg/dL, J8r 1.8%.  LDL 31 on recent check.  Well-controlled on current dose of 40 mg rosuvastatin .  Continue current dose

## 2023-12-11 NOTE — Assessment & Plan Note (Signed)
 Blood pressure elevated. Current regimen ineffective. Amlodipine  may contribute to edema. Plan to switch considering diabetes and kidney function. - Discontinued amlodipine . - Initiated valsartan -HCTZ 80-12.5 mg 1/2 tablet daily - Continue Imdur  30 mg daily - Monitor blood pressure and adjust treatment.

## 2023-12-11 NOTE — Assessment & Plan Note (Signed)
 Has had a history of orthostatic hypotension but is hypertensive today.  Encourage adequate hydration.  I worry with the use of Cardura  and Proscar  that this could be contributing.  Judicious use of the HCTZ component of valsartan -HCTZ

## 2023-12-11 NOTE — Assessment & Plan Note (Signed)
 Lower extremity edema in the setting of peripheral vascular disease and neuropathy Chronic edema likely worsened by amlodipine . No signs of heart failure. Neuropathy may impair venous return. - Discontinued amlodipine . - Initiated valsartan  or irbesartan with a diuretic. - Advised wearing compression socks. - Recommended elevating feet.

## 2023-12-12 NOTE — Telephone Encounter (Signed)
 Rx sent pt made aware

## 2023-12-22 ENCOUNTER — Other Ambulatory Visit: Payer: Self-pay | Admitting: Cardiology

## 2023-12-23 LAB — BASIC METABOLIC PANEL WITH GFR
BUN/Creatinine Ratio: 16 (calc) (ref 6–22)
BUN: 25 mg/dL (ref 7–25)
CO2: 30 mmol/L (ref 20–32)
Calcium: 9.4 mg/dL (ref 8.6–10.3)
Chloride: 101 mmol/L (ref 98–110)
Creat: 1.55 mg/dL — ABNORMAL HIGH (ref 0.70–1.22)
Glucose, Bld: 224 mg/dL — ABNORMAL HIGH (ref 65–99)
Potassium: 4.2 mmol/L (ref 3.5–5.3)
Sodium: 139 mmol/L (ref 135–146)
eGFR: 43 mL/min/1.73m2 — ABNORMAL LOW

## 2023-12-25 ENCOUNTER — Ambulatory Visit: Attending: Cardiology | Admitting: Cardiology

## 2023-12-25 ENCOUNTER — Other Ambulatory Visit (HOSPITAL_COMMUNITY): Payer: Self-pay

## 2023-12-25 ENCOUNTER — Ambulatory Visit

## 2023-12-25 ENCOUNTER — Telehealth: Payer: Self-pay | Admitting: Cardiology

## 2023-12-25 ENCOUNTER — Encounter: Payer: Self-pay | Admitting: Cardiology

## 2023-12-25 VITALS — BP 151/73 | HR 64 | Ht 66.0 in | Wt 140.0 lb

## 2023-12-25 DIAGNOSIS — I10 Essential (primary) hypertension: Secondary | ICD-10-CM | POA: Diagnosis not present

## 2023-12-25 DIAGNOSIS — I25118 Atherosclerotic heart disease of native coronary artery with other forms of angina pectoris: Secondary | ICD-10-CM | POA: Diagnosis not present

## 2023-12-25 DIAGNOSIS — R55 Syncope and collapse: Secondary | ICD-10-CM | POA: Diagnosis not present

## 2023-12-25 DIAGNOSIS — R600 Localized edema: Secondary | ICD-10-CM

## 2023-12-25 MED ORDER — FUROSEMIDE 40 MG PO TABS
40.0000 mg | ORAL_TABLET | Freq: Every day | ORAL | 1 refills | Status: AC
  Filled 2023-12-25: qty 30, 30d supply, fill #0

## 2023-12-25 NOTE — Telephone Encounter (Signed)
 Pt c/o BP issue: STAT if pt c/o blurred vision, one-sided weakness or slurred speech.  STAT if BP is GREATER than 180/120 TODAY.  STAT if BP is LESS than 90/60 and SYMPTOMATIC TODAY  1. What is your BP concern?   Patient's BP has been trending low  2. Have you taken any BP medication today?  No  3. What are your last 5 BP readings?  120/50 106/41 - Yesterday 113/45 - 12/13 when patient blacked out for about 5 minutes  4. Are you having any other symptoms (ex. Dizziness, headache, blurred vision, passed out)?   No   Daughter Horris) stated patient's BP has been trending low and patient blacked out on 12/13 and patient's blood sugar was normal.  Daughter stated patient is also having swelling in his right leg and his calf is hard to where he can hardly walk.

## 2023-12-25 NOTE — Telephone Encounter (Signed)
 Reviewed with Dr Elmira and he can see patient today at 1 PM.  Daughter notified and patient will be here for appointment today

## 2023-12-25 NOTE — Progress Notes (Signed)
 " Cardiology Office Note:  .   Date:  12/25/2023  ID:  [name], DOB 1932-08-22, MRN [id number] PCP: [facility]  [facility] Providers Cardiologist:  Craig Lawrence, MD PCP: [facility]  Chief Complaint  Patient presents with   Loss of Consciousness     [name] is a 89 y.o. male with hypertension, hyperlipidemia, type 2 diabetes mellitus, CAD s/p CABG X 3 then redo CABG (Lrad-dLAD after attretic LIMA and LAD stent occlusion) in 2001 with patents redo grafts in 2019  Discussed the use of AI scribe software for clinical note transcription with the patient, who gave verbal consent to proceed.  History of Present Illness [name] is an 89 year old male with coronary artery disease who presents with leg swelling and a recent episode of syncope. He was referred by Dr. Anner for evaluation of leg swelling and syncope.  He has significant right leg swelling since early December that has persisted despite stopping amlodipine . The swelling is painful, limits walking, and has not improved.  He had a syncope episode on December 13 after yard work in [employer]. His wife found him unconscious on the floor, unresponsive for over five minutes. He had eaten breakfast and was bundled for cold. He had no chest pain, lightheadedness, or shortness of breath before passing out. After the episode his blood sugar was 330 mg/dL and blood pressure was 105/53 mmHg.  He has coronary artery disease with prior stent placement and bypass surgery. He was told a stent was blocked, but a heart catheterization six years ago showed a patent bypass graft. He has no recent change in his usual post-surgical chest discomfort and no new dyspnea.  He reports left arm pain for several weeks that ultrasound showed to be fatty tissue without clot. The pain is localized and worse with pressure.  He has neuropathy in his feet. He is not driving and depends on his  daughter for transportation.    Vitals:   12/25/23 1300  BP: (!) 151/73  Pulse: 64  SpO2: 96%   Orthostatic VS for the past 72 hrs (Last 3 readings):  Orthostatic BP Patient Position BP Location Cuff Size Orthostatic Pulse  12/25/23 1309 157/62 Standing Right Arm Normal 69  12/25/23 1308 153/58 Standing Right Arm Normal 70  12/25/23 1307 151/73 Sitting Right Arm Normal 64      Review of Systems  Cardiovascular:  Positive for leg swelling and syncope. Negative for chest pain, dyspnea on exertion and palpitations.  Musculoskeletal:        Left arm pain, constant, since the fall        Studies Reviewed: [name]        EKG 12/25/2023: Sinus rhythm with 1st degree A-V block Minimal voltage criteria for LVH, may be normal variant ( R in aVL ) When compared with ECG of 06-Dec-2023 14:59, PR interval has increased  Echocardiogram 2023: 1. Left ventricular ejection fraction, by estimation, is 60 to 65%. The  left ventricle has normal function. The left ventricle has no regional  wall motion abnormalities. Left ventricular diastolic parameters were  normal.   2. Right ventricular systolic function is normal. The right ventricular  size is normal.   3. Left atrial size was mildly dilated.   4. The mitral valve is normal in structure. Mild mitral valve  regurgitation. No evidence of mitral stenosis.   5. The aortic valve is tricuspid. Aortic valve regurgitation is trivial.  No aortic  stenosis is present.   6. The inferior vena cava is normal in size with greater than 50%  respiratory variability, suggesting right atrial pressure of 3 mmHg.    Labs 12/22/2023: Hb 12.7 Cr 1.55 (1.0 in 10.2025)   2024: TSH 4.0  2023: Chol 108, TG 125, HDL 36, LDL 50 HbA1C 7.8%   Physical Exam Vitals and nursing note reviewed.  Constitutional:      General: He is not in acute distress. Neck:     Vascular: No JVD.  Cardiovascular:     Rate and Rhythm: Normal rate and regular rhythm.      Heart sounds: Normal heart sounds. No murmur heard. Pulmonary:     Effort: Pulmonary effort is normal.     Breath sounds: Normal breath sounds. No wheezing or rales.  Musculoskeletal:     Right lower leg: Edema (2+) present.     Left lower leg: Edema (Trace) present.      VISIT DIAGNOSES:   ICD-10-CM   1. Syncope and collapse  R55 ECHOCARDIOGRAM COMPLETE    LONG TERM MONITOR-LIVE TELEMETRY (3-14 DAYS)    Basic metabolic panel with GFR    Pro b natriuretic peptide (BNP)    Pro b natriuretic peptide (BNP)    Basic metabolic panel with GFR    2. Leg edema  R60.0     3. Essential hypertension, benign  I10 EKG 12-Lead    4. Coronary artery disease of native artery of native heart with stable angina pectoris  I25.118 EKG 12-Lead       [name] is a 89 y.o. male with hypertension, hyperlipidemia, type 2 diabetes mellitus, CAD s/p CABG X 3 then redo CABG (Lrad-dLAD after attretic LIMA and LAD stent occlusion) in 2001 with patents redo grafts in 2019 Assessment & Plan Right lower extremity edema: Persistent edema in right lower extremity, more pronounced than left. No improvement post-amlodipine  discontinuation. No clots detected (reportedly by daughter based on PCP report). Possible fluid retention and acute HFpEF.  - Started furosemide  40 mg once daily. Check BMP, proBNP in 1 week. - Ordered echocardiogram. - Nurses may help his elevated blood pressure today as well.  Syncope: No warning symptoms. Resting EKG with first degree AV block. Known prior history of orthostasis, but patient is hypertensive today. Increased creatinine at 1.553 days ago, baseline around 1.0.  BUN/creatinine ratio suggests against dehydration. Recommend 2-week exam monitor. Also, will check echocardiogram, last checked in 2023. Fortunately, patient does not drive anyway.  Hypertension: Blood pressure elevated today, orthostatics negative.  He has been holding valsartan  since the fall.  I  would opt for Lasix  or valsartan  to treat his leg swelling at this time.  Okay to hold valsartan  for now, unless SBP consistently >160 mmHg.  AKI: If related to heart failure, expect this to improve after initiation of Lasix . BMP, proBNP in 1 week.  CAD: Redo CABG in 2001, patent grafts in 2019. No angina symptoms at this time. Continue Plavix .  Left arm pain: Constant since fall.  No obvious hematoma, low suspicion of fracture. If no improvement in few days, check with PCP.   Labs can be checked in [place], echocardiogram i and checked in [place].    Meds ordered this encounter  Medications   furosemide  (LASIX ) 40 MG tablet    Sig: Take 1 tablet (40 mg total) by mouth daily.    Dispense:  30 tablet    Refill:  1     F/u on 2/11 at [facility]  Rana, NP, as previously scheduled.  Signed, Craig JINNY Lawrence, MD  "

## 2023-12-25 NOTE — Progress Notes (Unsigned)
 ZIO AT serial # [id number] from office inventory applied to patient.

## 2023-12-25 NOTE — Telephone Encounter (Signed)
 I spoke with patient's daughter.  She reports patient was outside raking and blowing leaves on 12/13.  He came in and his wife found him face down on the floor.  Daughter came over and patient was unresponsive.  He was breathing and had a pulse. Daughter does not know how long patient was out but feels it was at least 5 minutes.  She rolled patient over and he became responsive.  BP was 105/53 and blood sugar was over 300.  Patient said he was then feeling OK and EMS was not called. Since then patient saw clinical pharmacist at PCP office on 12/16 but has not seen any other provider Wife reports patient had swelling in his legs when he saw Dr Anner earlier this month but swelling has worsened.  Swelling is greater in right leg.  Calf is very hard and painful.  Patient has neuropathy but is having more trouble walking now than usual due to the swelling and pain. BP this AM was 120/50.  Patient hold Valsartan  hydrochlorothiazide  this AM until reviewed with cardiology Will review with DOD

## 2023-12-25 NOTE — Patient Instructions (Addendum)
 Medication Instructions:  START Lasix  40 mg daily   Hold Valsartan  as long a systolic blood pressure (top number) is below 160   *If you need a refill on your cardiac medications before your next appointment, please call your pharmacy*  Lab Work in 1 week: Bmp Probnp  If you have labs (blood work) drawn today and your tests are completely normal, you will receive your results only by: MyChart Message (if you have MyChart) OR A paper copy in the mail If you have any lab test that is abnormal or we need to change your treatment, we will call you to review the results.  Testing/Procedures: 2 week zio (live)  Your physician has requested that you wear a Zio heart monitor for __14___ days. This will be mailed to your home with instructions on how to apply the monitor and how to return it when finished. Please allow 2 weeks after returning the heart monitor before our office calls you with the results.   Echocardiogram  Your physician has requested that you have an echocardiogram. Echocardiography is a painless test that uses sound waves to create images of your heart. It provides your doctor with information about the size and shape of your heart and how well your hearts chambers and valves are working. This procedure takes approximately one hour. There are no restrictions for this procedure. Please do NOT wear cologne, perfume, aftershave, or lotions (deodorant is allowed). Please arrive 15 minutes prior to your appointment time.  Please note: We ask at that you not bring children with you during ultrasound (echo/ vascular) testing. Due to room size and safety concerns, children are not allowed in the ultrasound rooms during exams. Our front office staff cannot provide observation of children in our lobby area while testing is being conducted. An adult accompanying a patient to their appointment will only be allowed in the ultrasound room at the discretion of the ultrasound technician under  special circumstances. We apologize for any inconvenience.  Follow-Up: At [facility], you and your health needs are our priority.  As part of our continuing mission to provide you with exceptional heart care, our providers are all part of one team.  This team includes your primary Cardiologist (physician) and Advanced Practice Providers or APPs (Physician Assistants and Nurse Practitioners) who all work together to provide you with the care you need, when you need it.  Your next appointment:   Keep appointment 2/11  Provider:   Alm Clay, MD  or APP  We recommend signing up for the patient portal called MyChart.  Sign up information is provided on this After Visit Summary.  MyChart is used to connect with patients for Virtual Visits (Telemedicine).  Patients are able to view lab/test results, encounter notes, upcoming appointments, etc.  Non-urgent messages can be sent to your provider as well.   To learn more about what you can do with MyChart, go to [email or web address].

## 2023-12-26 NOTE — Progress Notes (Signed)
 History of Present Illness This is a 89 y.o. male who returns for follow-up of type 2 diabetes.  [name] has had multiple health issues since his last visit.  His PCP lowered his insulin  doses last month after getting low blood sugars on his Dexcom G7 CGM (reports n/a).  However, his glucoses have been overall extremely high since then.  His last 14 day average glucose on his CGM is 305 and time in target range (glucose 70-180) is 1%.  Fasting blood sugars trend down to the high-100s to low-200s.SABRA  Post-meal blood sugars rise to the 300-400s.  Hgb A1C today is 9.7%.     No past medical history on file. No past surgical history on file. Allergies[1] Medications Ordered Prior to Encounter[2] Family History[3] Social History[4]  Review of Systems Eight systems were reviewed; pertinent positives and negatives are as mentioned in the HPI.    Physical Examination Vitals:   12/26/23 1012  Weight: 140 lb (63.5 kg)  Height: 5' (1.524 m)   Body mass index is 27.34 kg/m. GENERAL:  Pleasant, well-appearing male in no distress. HEENT: Pupils equal, round and reactive to light. Extraocular movements intact. Nondilated fundoscopic exam reveals no signs of retinopathy. Mucous membranes moist with no oropharyngeal lesions. NECK: Supple with no lymphadenopathy or thyromegaly. CARDIOVASCULAR: Normal rate and regular rhythm with no murmurs, rubs or gallops; 2+ distal pulses. EXTREMITIES: Warm and well-perfused.  No clubbing, cyanosis or edema. NEUROLOGIC: Alert and oriented x3. Gait and station normal. SKIN: Warm and dry, with no rashes. No acanthosis nigricans.  Recent Results (from the past week)  POCT A1C   Collection Time: 12/26/23 10:17 AM  Result Value Ref Range   Hemoglobin A1c 9.7 (A) 4.8 - 5.6 %    A/P:   1.  Type 2 Diabetes w/neuropathy (IDDM):  Hgb A1C up to 9.7% (was 8.8% in August).  [name] has had multiple health issues since his last visit.  His PCP lowered his insulin  doses  last month after getting low blood sugars on his Dexcom G7 CGM (reports n/a).  However, his glucoses have been overall extremely high since then.  His last 14 day average glucose on his CGM is 305 and time in target range (glucose 70-180) is 1%.  Fasting blood sugars trend down to the high-100s to low-200s.SABRA  Post-meal blood sugars rise to the 300-400s.  [name]'s insulin  requirements are elevated due to his acute/chronic illness as well as stopping his GLP-1 agonist therapy in August due to GI side effects.  I will raise his Tresiba  Flextouch from 20 to 22 units daily and his Humalog Kwikpen from 6/8/8 back to 08/12/08 units TID with meals.  He would likely benefit most from a sensor-augmented insulin  pump system to better matchi his requirements than injections.  Prescriptions for the Omnipod 5 insulin  pump system was sent in for benefits investigation.  He will contact me when he has supplies to be scheduled for pump training.  2.  Type 2 Diabetes w/nephropathy:  CKD stage 3b with eGFR 43 (Creatinine 1.55) on 12/22/23 labs.  Management per Nephrology.  3. Type 2 Diabetes w/retinopathy:  Follow-up with Ophthalmology.  4.  Hyperlipidemia:  Continue Rosuvastatin  40mg  daily.  Follow-up in 3 months.     Craig FORBES Lipps, MD 12/26/2023, 12:48 PM        [1] Allergies Allergen Reactions   Iodinated Diagnostic Agents Other and Rash    Intractable shaking.    Shivering & shaking. Pt reports takes antihistamine prior to procedures.  Shivering & shaking. Pt reports takes antihistamine prior to procedures.  Intractable shaking.   Iodine Unknown   Metformin  And Related Diarrhea and Nausea And Vomiting    Subsequently discontinued  [2] Current Outpatient Medications on File Prior to Visit  Medication Sig Dispense Refill   albuterol  sulfate HFA (PROVENTIL ,VENTOLIN ,PROAIR ) 108 (90 Base) MCG/ACT inhaler SMARTSIG:2 Puff(s) By Mouth 4 Times Daily     ascorbic acid  (VITAMIN C ) tablet Take one  tablet (500 mg dose) by mouth.     brinzolamide  (AZOPT ) 1% ophthalmic suspension one drop.     Cholecalciferol  (VITAMIN D ) 50 mcg (2000 UT) tablet Take one tablet (2,000 Units dose) by mouth 1 (one) time each day with breakfast.     clopidogrel  bisulfate (PLAVIX ) 75 mg tablet Take one tablet (75 mg dose) by mouth daily.     Continuous Glucose Receiver (DEXCOM G7 RECEIVER) DEVI USE AS DIRECTED 1 each 0   doxazosin  mesylate (CARDURA ) 4 mg tablet SMARTSIG:0.5 Tablet(s) By Mouth Every Morning     ferrous sulfate 325 (65 FE) MG tablet Take one tablet (325 mg dose) by mouth.     finasteride  (PROSCAR ) 5 mg tablet Take one tablet (5 mg dose) by mouth daily.     fish oil (SEA-OMEGA) 1000 mg CAPS Take one capsule (1,000 mg dose) by mouth 2 (two) times a day with meals.     furosemide  (LASIX ) 40 mg tablet Take one tablet (40 mg dose) by mouth daily.     gabapentin  (NEURONTIN ) 300 mg capsule Take three capsules (900 mg dose) by mouth 3 (three) times a day.     HUMALOG KWIKPEN 100 UNIT/ML SOPN injection Inject eight Units into the skin with breakfast AND ten Units at noon. Take with meals. AND ten Units with supper. 15 mL 5   isosorbide  mononitrate (IMDUR ) 30 mg 24 hr tablet Take one tablet (30 mg dose) by mouth daily.     pantoprazole  sodium (PROTONIX ) 40 mg tablet Take one tablet (40 mg dose) by mouth 2 (two) times daily.     prednisoLONE  acetate (PRED FORTE ,ECONOPRED ) 1% ophthalmic suspension Apply one drop to eye daily.     rosuvastatin  calcium  (CRESTOR ) 40 mg tablet Take one tablet (40 mg dose) by mouth daily.     Thiamine HCl (VITAMIN B-1) 250 mg tablet Take one tablet (250 mg dose) by mouth daily.     valsartan -hydrochlorothiazide  (DIOVAN -HCT) 80-12.5 MG per tablet Take one half tablet by mouth daily.     vitamin B-6 (PYRIDOXINE) 100 mg tablet Take one tablet (100 mg dose) by mouth daily.     No current facility-administered medications on file prior to visit.  [3] No family history  on file. [4] Social History Socioeconomic History   Marital status: Married  Tobacco Use   Smoking status: Unknown

## 2024-01-02 ENCOUNTER — Telehealth: Payer: Self-pay | Admitting: Cardiology

## 2024-01-02 ENCOUNTER — Ambulatory Visit (HOSPITAL_COMMUNITY)
Admission: RE | Admit: 2024-01-02 | Discharge: 2024-01-02 | Disposition: A | Discharge status: Home / Self Care | Source: Ambulatory Visit | Attending: Family Medicine | Admitting: Family Medicine

## 2024-01-02 ENCOUNTER — Other Ambulatory Visit: Payer: Self-pay | Admitting: Family Medicine

## 2024-01-02 DIAGNOSIS — M79661 Pain in right lower leg: Secondary | ICD-10-CM | POA: Insufficient documentation

## 2024-01-02 NOTE — Telephone Encounter (Signed)
 Patient identification verified by 2 forms.   Called and spoke to patient  Patient's daughter states:  -Blood clot all the way up his right leg  -Daughter is on the way to pick up the medication currently.  -She wanted to inform the office about the change.

## 2024-01-02 NOTE — Telephone Encounter (Signed)
 Pt c/o medication issue:  1. Name of Medication: clopidogrel  (PLAVIX ) 75 MG tablet   2. How are you currently taking this medication (dosage and times per day)? As written  3. Are you having a reaction (difficulty breathing--STAT)? No  4. What is your medication issue? Patient daughter stated PCP is recommending that patient stop taking the Plavix  medication and start taking  Eliquis 5mg  two tablets twice a day for 7 days then 1 tablet twice a day for 4 month due to  Pt having a RT leg ultrasound and it showed  blood clots. PT daughter is requesting Dr Sibyl opinion. Please advise.

## 2024-01-16 ENCOUNTER — Ambulatory Visit: Attending: Cardiology

## 2024-01-16 DIAGNOSIS — R55 Syncope and collapse: Secondary | ICD-10-CM | POA: Diagnosis not present

## 2024-01-17 ENCOUNTER — Ambulatory Visit: Payer: Self-pay | Admitting: Cardiology

## 2024-01-17 DIAGNOSIS — R55 Syncope and collapse: Secondary | ICD-10-CM

## 2024-01-17 LAB — ECHOCARDIOGRAM COMPLETE
AR max vel: 1.98 cm2
AV Area VTI: 1.99 cm2
AV Area mean vel: 1.7 cm2
AV Mean grad: 8.4 mmHg
AV Peak grad: 15.1 mmHg
Ao pk vel: 1.95 m/s
Area-P 1/2: 3 cm2
Calc EF: 52.7 %
MV M vel: 6.1 m/s
MV Peak grad: 148.8 mmHg
P 1/2 time: 569 ms
S' Lateral: 2.9 cm
Single Plane A2C EF: 39 %
Single Plane A4C EF: 63.7 %

## 2024-01-17 NOTE — Progress Notes (Signed)
 Normal heart function.  Mild leakiness of mitral, tricuspid and aortic valves, can be monitored clinically.  Seeing you in a few weeks.  Thanks MJP

## 2024-01-17 NOTE — Progress Notes (Signed)
 Specific up notes I seen by me in 12/2023 in your absence for leg edema, syncope.  Started on Lasix  and placed on 2-week Zio monitor that showed several episodes of SVT, longest over 8 minutes, no reported symptoms.  Normal EF, mild MR, mild TR, mild AI.  Has follow-up with [facility] in February.  If no recurrent syncope or symptoms from SVT, I think further management can be discussed during February follow-up, unless you feel otherwise.  Thanks MJP

## 2024-02-02 NOTE — Telephone Encounter (Addendum)
 Spoke with patient's daughter Holli, reviewed note from 01/02/24 when she first reported patient had a blood clot in his right leg and was started on Eliquis.  Informed Holli message was forwarded to Dr. Anner and his nurse, but no response from provider seen. Reviewed heart monitor results and echo results with Shelia. Informed her if no new or recurrent symptoms then we will plan to keep follow-up as scheduled with APP on 02/14/2024. Shelia verbalized understanding and reports other than blood clot in R leg and starting Eliquis no changes.  Shelia states Dr. Toribio (PCP) is who ordered the ultrasound and started patient on Eliquis 5 mg BID. Shelia reports Dr. Toribio also discontinued patient's Plavix  at that time on 01/02/24. Medication list updated.  Will forward to Dr. Anner to review.

## 2024-02-02 NOTE — Telephone Encounter (Signed)
 Pt daughter wanting to make sure pt doesn't need to come sooner due to blood clot. Please advise.

## 2024-02-02 NOTE — Addendum Note (Signed)
 Addended by: VICCI ROXIE CROME on: 02/02/2024 09:53 AM   Modules accepted: Orders

## 2024-02-03 NOTE — Telephone Encounter (Signed)
 This I was not here in December.  Motion previous Doppler showed extensive thrombus and he was placed on Eliquis.  Appropriately, Plavix  was discontinued. The appropriate treatment for DVT is at least 3 if not longer months of treatment with DOAC such as Eliquis.  It is reasonable to be off Plavix  in this timeframe.  His echocardiogram was reviewed-essentially unrevealing.  Stable The event monitor showed a few SVT runs with the longest being about 8 minutes.  Is possible that his syncopal episode back in December could have been related to SVT.  The question we need to find out in his follow-up include: ?  Any further syncope, any dyspnea or rapid heart rate/tachycardia which would make us  concerned about possible PE as a cause for his syncope.  We usually follow-up lower extremity venous Doppler in 3 months, but based on the size of the DVT, would probably wait till least 6 months to check.  Alm Clay, MD

## 2024-02-05 NOTE — Telephone Encounter (Signed)
 Spoke with patient's daughter. She reports the patient's right leg remains painful, hot, and itchy at the site of known DVT. She states he frequently rubs and scratches the area. Instructed to avoid rubbing to prevent further irritation and worsening inflammation.  Daughter denies any shortness of breath, chest pain, or new swelling. Daughter expressed concern about fast heartbeats noted during the recent echocardiogram. Reviewed that, per Dr. Anner, the elevated heart rate at that time may have been related to his prior fainting episode.  Reinforced plan to remain off Plavix  and continue Eliquis for at least 3 months as appropriate treatment for his DVT that his PCP is managing. No need for earlier follow-up; patient already has an appointment scheduled for the 11th with [facility].  Reviewed ED precautions, worsening leg swelling/redness, severe pain, chest pain, shortness of breath, or rapid heart rate. Daughter verbalized understanding.

## 2024-02-14 ENCOUNTER — Ambulatory Visit: Admitting: Emergency Medicine

## 2024-02-22 ENCOUNTER — Ambulatory Visit: Admitting: Gastroenterology
